# Patient Record
Sex: Male | Born: 1935 | Race: White | Hispanic: No | Smoking: Never smoker
Health system: Southern US, Community
[De-identification: ages and names within clinical notes are randomized; demographics above are authoritative.]

## PROBLEM LIST (undated history)

## (undated) DIAGNOSIS — K219 Gastro-esophageal reflux disease without esophagitis: Secondary | ICD-10-CM

## (undated) DIAGNOSIS — E785 Hyperlipidemia, unspecified: Secondary | ICD-10-CM

## (undated) DIAGNOSIS — I10 Essential (primary) hypertension: Secondary | ICD-10-CM

## (undated) DIAGNOSIS — G5603 Carpal tunnel syndrome, bilateral upper limbs: Secondary | ICD-10-CM

## (undated) DIAGNOSIS — M1990 Unspecified osteoarthritis, unspecified site: Secondary | ICD-10-CM

## (undated) HISTORY — PX: CHOLECYSTECTOMY: SHX55

## (undated) HISTORY — PX: REPLACEMENT TOTAL KNEE: SUR1224

---

## 1999-05-12 HISTORY — PX: INGUINAL HERNIA REPAIR: SUR1180

## 2008-05-17 ENCOUNTER — Ambulatory Visit: Payer: Self-pay | Admitting: Unknown Physician Specialty

## 2008-05-18 ENCOUNTER — Ambulatory Visit: Payer: Self-pay | Admitting: Unknown Physician Specialty

## 2009-05-27 ENCOUNTER — Ambulatory Visit: Payer: Self-pay | Admitting: Unknown Physician Specialty

## 2011-10-19 ENCOUNTER — Ambulatory Visit: Payer: Self-pay | Admitting: General Practice

## 2011-10-19 LAB — CBC
HCT: 40.5 % (ref 40.0–52.0)
HGB: 13.3 g/dL (ref 13.0–18.0)
MCH: 29.8 pg (ref 26.0–34.0)
MCHC: 32.9 g/dL (ref 32.0–36.0)
MCV: 91 fL (ref 80–100)
Platelet: 239 10*3/uL (ref 150–440)
RBC: 4.46 10*6/uL (ref 4.40–5.90)
RDW: 14.3 % (ref 11.5–14.5)
WBC: 5.4 10*3/uL (ref 3.8–10.6)

## 2011-10-19 LAB — BASIC METABOLIC PANEL
Anion Gap: 10 (ref 7–16)
BUN: 14 mg/dL (ref 7–18)
Calcium, Total: 8.9 mg/dL (ref 8.5–10.1)
Chloride: 109 mmol/L — ABNORMAL HIGH (ref 98–107)
Co2: 24 mmol/L (ref 21–32)
Creatinine: 1.09 mg/dL (ref 0.60–1.30)
EGFR (African American): >60
EGFR (Non-African Amer.): >60
Glucose: 89 mg/dL (ref 65–99)
Osmolality: 285 (ref 275–301)
Sodium: 143 mmol/L (ref 136–145)

## 2011-10-19 LAB — URINALYSIS, COMPLETE
Bilirubin,UR: NEGATIVE
Blood: NEGATIVE
Glucose,UR: NEGATIVE mg/dL (ref 0–75)
Leukocyte Esterase: NEGATIVE
Ph: 5 (ref 4.5–8.0)
Protein: NEGATIVE
RBC,UR: NONE SEEN /HPF (ref 0–5)
Specific Gravity: 1.016 (ref 1.003–1.030)
Squamous Epithelial: NONE SEEN
WBC UR: 1 /HPF (ref 0–5)

## 2011-10-19 LAB — PROTIME-INR
INR: 0.9
Prothrombin Time: 12.4 secs (ref 11.5–14.7)

## 2011-10-19 LAB — MRSA PCR SCREENING

## 2011-10-20 LAB — URINE CULTURE

## 2011-10-26 ENCOUNTER — Inpatient Hospital Stay: Payer: Self-pay | Admitting: General Practice

## 2011-10-27 LAB — BASIC METABOLIC PANEL
Anion Gap: 7 (ref 7–16)
BUN: 18 mg/dL (ref 7–18)
Calcium, Total: 8.4 mg/dL — ABNORMAL LOW (ref 8.5–10.1)
Co2: 26 mmol/L (ref 21–32)
Creatinine: 1.09 mg/dL (ref 0.60–1.30)
EGFR (African American): >60
EGFR (Non-African Amer.): >60
Glucose: 95 mg/dL (ref 65–99)
Potassium: 4.5 mmol/L (ref 3.5–5.1)
Sodium: 141 mmol/L (ref 136–145)

## 2011-10-28 LAB — PLATELET COUNT: Platelet: 161 10*3/uL (ref 150–440)

## 2011-10-28 LAB — BASIC METABOLIC PANEL
Anion Gap: 8 (ref 7–16)
BUN: 17 mg/dL (ref 7–18)
Calcium, Total: 7.9 mg/dL — ABNORMAL LOW (ref 8.5–10.1)
Creatinine: 1.12 mg/dL (ref 0.60–1.30)
EGFR (African American): >60
EGFR (Non-African Amer.): >60
Glucose: 81 mg/dL (ref 65–99)
Osmolality: 280 (ref 275–301)
Potassium: 4.1 mmol/L (ref 3.5–5.1)
Sodium: 140 mmol/L (ref 136–145)

## 2011-10-28 LAB — HEMOGLOBIN: HGB: 10.6 g/dL — ABNORMAL LOW (ref 13.0–18.0)

## 2014-09-02 NOTE — Op Note (Signed)
PATIENT NAME:  Thomas [name], GAZDA MR#:  [id number] DATE OF BIRTH:  [birth year]  DATE OF PROCEDURE:  10/26/2011  PREOPERATIVE DIAGNOSES:  1. Degenerative arthrosis of the right knee.  2. Retained hardware status post open reduction internal fixation of right patella fracture.   POSTOPERATIVE DIAGNOSES:  1. Degenerative arthrosis of the right knee.  2. Retained hardware status post open reduction internal fixation of right patella fracture.   PROCEDURES PERFORMED:  1. Right total knee arthroplasty using computer-assisted navigation.  2. Removal of retained wires from the right patella.   SURGEON: Laurice Record. Holley Bouche., MD   ASSISTANT: Vance Peper, PA-C (required to maintain retraction throughout the procedure)   ANESTHESIA: Femoral nerve block and spinal.   ESTIMATED BLOOD LOSS: 50 mL.   FLUIDS REPLACED: 1500 mL of Crystalloid.   DRAINS: Two medium drains to reinfusion system.   TOURNIQUET TIME: 120 minutes.   SOFT TISSUE RELEASES: Anterior cruciate ligament, posterior cruciate ligament, deep and superficial medial collateral ligament, patellofemoral ligament.   IMPLANTS UTILIZED: DePuy PFC Sigma size 4 posterior stabilized femoral component (cemented), size 5 MBT tibial component (cemented), 41 mm three peg oval dome patella (cemented), and a 10 mm stabilized rotating platform polyethylene insert.   INDICATIONS FOR SURGERY: The patient is a 81 year old male who has been seen for complaints of progressive right knee pain as well as varus deformity. X-rays demonstrated severe degenerative changes. Also of note were two screws and wires from previous open reduction internal fixation of right patella fracture. After discussion of the risks and benefits of surgical intervention, the patient expressed his understanding of the risks and benefits and agreed with plans for surgical intervention.   PROCEDURE IN DETAIL: The patient was brought in the operating room and, after adequate femoral nerve  block and spinal anesthesia was achieved, a tourniquet was placed on the patient's upper right thigh. The patient's right knee and leg were cleaned and prepped with alcohol and DuraPrep and draped in the usual sterile fashion. A "time-out" was performed as per usual protocol. The right lower extremity was exsanguinated using Esmarch and the tourniquet was inflated to 300 mmHg. Anterior longitudinal incision was made followed by a standard mid vastus approach. A large effusion was evacuated. The deep fibers of the medial collateral ligament were elevated in a subperiosteal fashion off the medial flare of the tibia so as to maintain a continuous soft tissue sleeve. The patella was subluxed laterally and the patellofemoral ligament was incised. Inspection of the knee demonstrated severe degenerative changes in tricompartmental fashion with eburnated bone noted most prominently to the medial compartment. Osteophytes were debrided using a rongeur. Anterior and posterior cruciate ligaments were excised. Two 4.0 mm Schanz pins were inserted into the femur and into the tibia for attachment of the ray of trackers used for computer-assisted navigation. Hip center was identified using circumduction technique. Distal landmarks were mapped using the computer. Distal femur and proximal tibia were mapped using the computer. Distal femoral cutting guide was positioned using computer-assisted navigation so as to achieve a 5 degree distal valgus cut. Cut was performed and verified using the computer. Distal femur was then sized and it was felt that a size 4 femoral component was appropriate. Size 4 cutting guide was positioned and the anterior cut was performed and verified using the computer. This was followed by completion of the posterior and chamfer cuts. Femoral cutting guide for the central box was then positioned and central box cut was performed.   Attention was then  directed to the proximal tibia. Medial and lateral  menisci were excised. The extramedullary tibial cutting guide was positioned using computer-assisted navigation so as to achieve 0 degree varus valgus alignment and 0 degree posterior slope. Cut was performed and verified using the computer. Proximal tibia was sized and it was felt that a size 5 tibial tray was appropriate. Size 5 tibial trial was placed as was femoral trial and a 10 mm polyethylene insert. The knee was felt to be tight medially. A Cobb elevator was used to elevate the superficial fibers of the medial collateral ligament. This allowed for excellent mediolateral soft tissue balancing both in full extension and in 90 degrees of flexion.   Finally, attention was directed to the patella. The two wires were identified and elevated off of the patella. An attempt was made to localize the screw heads of the cannulated screws. However, there was significant bony overgrowth at the sites and it was elected to remove the wires and leave the screws intact if possible. The wires were removed without difficulty. The patella was then cut and prepared so as to accommodate a 41 mm three peg oval dome patella. Again, there was no interference from the retained screws. Trial patellar component was placed and the knee was placed through a range of motion with excellent patellar tracking appreciated.   Femoral trial was removed after debridement of osteophytes off of the posterior condyles. The central post hole for the tibial component was reamed followed by insertion of a keel punch. Tibial trial was then removed. Cut surfaces of bone were irrigated with copious amounts of normal saline with antibiotic solution using pulsatile lavage and then suctioned dry. Polymethyl methacrylate cement was prepared in the usual fashion using a vacuum mixer. Cement was applied to the cut surface of the tibia as well as along the undersurface of a size 5 MBT tibial component. Tibial component was positioned and impacted into place.  Excess cement was removed using freer elevators. Cement was then applied to the cut surface of the femur as well as along the posterior flanges of size 4 posterior stabilized femoral component. Femoral component was positioned and impacted in place. Excess cement was removed using freer elevators. A 10 mm polyethylene trial was inserted and the knee was brought in full extension with steady axial compression applied. Finally, cement was applied to the backside of a 41 mm three peg oval dome patella and patellar component was positioned and patellar clamp applied. Excess cement was removed using freer elevators.   After adequate curing of cement, tourniquet was deflated after a total tourniquet time of 120 minutes. Hemostasis was achieved using electrocautery. Knee was irrigated with copious amounts of normal saline with antibiotic solution using pulsatile lavage and then suctioned dry. The knee was then inspected for any residual cement debris. 30 mL of 0.25% Marcaine with epinephrine was injected along the posterior capsule. A 10 mm stabilized rotating platform polyethylene insert was inserted and the knee was placed through a range of motion. Excellent mediolateral soft tissue balancing was appreciated and excellent patellar tracking was noted.   Two medium drains were placed in the wound bed and brought out through a separate stab incision to be attached to a reinfusion system. The medial parapatellar portion of the incision was reapproximated using interrupted sutures of #1 Vicryl. Subcutaneous tissue was approximated in layers using first #0 Vicryl followed by #2-0 Vicryl. Skin was closed with skin staples. Sterile dressing was applied.    The patient tolerated  the procedure well. He was transported to the recovery room in stable condition.   ____________________________ Laurice Record. Holley Bouche., MD jph:drc D: 10/26/2011 18:46:21 ET T: 10/27/2011 10:27:28 ET JOB#: [id number]  cc: Laurice Record. Holley Bouche., MD,  <Dictator> Laurice Record Holley Bouche MD ELECTRONICALLY SIGNED 10/30/2011 12:44

## 2014-09-02 NOTE — Discharge Summary (Signed)
PATIENT NAME:  Thomas [name], Thomas [name] MR#:  [id number] DATE OF BIRTH:  January 29, 1931  DATE OF ADMISSION:  10/26/2011 DATE OF DISCHARGE:  10/29/2011  ADMITTING DIAGNOSIS: Degenerative arthrosis of the right knee.   DISCHARGE DIAGNOSIS: Degenerative arthrosis of the right knee.   HISTORY: The patient is a pleasant 81 year old gentleman has been followed at [facility] for progression of right knee pain. He has previously underwent an ORIF of  a right patellar fracture performed by Dr. Leanor Kail several years ago. The patient had reported significant start-up stiffness as well as episodes of near giving way of the right knee. He was noted to have progressive bowing of his knees as well as some swelling. The patient localized most of the pain along the medial aspect of the joint line as well as some to the anterior portion. The patient had not seen any significant improvement in his condition despite the use of anti-inflammatory medications. His pain increased to the point that it was significantly interfering with his activities of daily living. X-rays taken in [facility] showed narrowing of the medial cartilage space with associated varus alignment. The patient was noted to have osteophyte as well as subchondral sclerosis. Also noted was screw and wires to the patella consistent with a history of ORIF of a fractured patella. After discussion of the risks and benefits of surgical intervention, the patient expressed his understanding of the risks and benefits and agreed for plans for surgical intervention.   PROCEDURE: Right total knee arthroplasty using computer-assisted navigation and removal of retained wire from the right patellar fracture. Anesthesia: Femoral nerve block with spinal. Soft tissue release: Anterior cruciate ligament, posterior cruciate ligament, deep and superficial medial collateral ligaments as well as the patellofemoral ligament. Implants utilized: DePuy PFC Sigma size 4  posterior stabilized femoral component (cemented), size 5 MBT tibial component (cemented), 41 mm three-pegged oval dome patella (cemented), and a 10 mm stabilized rotating platform polyethylene insert.   HOSPITAL COURSE: The patient tolerated the procedure very well. He had no complications. He was taken to the PAC-U where he was stabilized and then transferred to the Orthopedic floor. The patient began receiving anticoagulation therapy of Lovenox 30 mg subcutaneous every 12 hours per Anesthesia and Pharmacy protocol. He was fitted with the AVI compression foot pumps bilaterally set at 80 mmHg. TED stockings were applied. These were allowed to be removed one hour per 8-hour shift. The right one was applied on day 2 following removal of the Hemovac and the dressing change. The patient has denied any deep vein thrombosis of the lower extremity. His legs were elevated off the bed using rolled towels. The Polar Care was applied to the surgical leg maintaining a temperature of 40 to 50 degrees Fahrenheit.   The patient is denying any chest pain or shortness of breath. Vital signs have been stable. He has been afebrile. Hemodynamically he was stable. No transfusions were given other than the Autovac transfusions given the first six hours postoperatively.   The patient began receiving physical therapy on day one for gait training and transfers. He has done extremely well. Upon being discharged, he was ambulating greater than 200 feet. He was able to go up and down four sets of steps. He was independent with bed-to-chair transfers. Occupational therapy was also initiated on day one for activities of daily living and assistive devices.   The patient's IV, Foley and Hemovac were all decreased on day two along with a dressing change. The wound has been  free of any drainage or any signs of infection.   DISPOSITION: The patient is being discharged to home in improved stable condition.   DISCHARGE INSTRUCTIONS:   1. He may weight bear as tolerated. He will continue to use a walker until cleared by physical therapy to go to a quad cane.  2. He will receive Home Health physical therapy.  3. He will continue wearing his stockings during the day but may remove these at night.   4. He is to try to wear the Polar Care pretty much 24 hours a day if at all possible. He is to maintain a temperature of 40 to 50 degrees Fahrenheit with the Polar Care.  5. He is placed on a regular diet.  6. He has a follow-up appointment in the clinic in two weeks.  7. He is to call the clinic sooner if any temperatures of 101.5 or greater or excessive bleeding.   DISCHARGE MEDICATIONS: He is to resume his regular medications that he was on prior to admission. He was given a prescription for Lovenox 40 mg subcutaneously daily for 14 days, then discontinue and begin taking one 81 mg enteric-coated aspirin, a second prescription for Roxicodone 5 to 10 mg every 4 to 6 hours p.r.n. for pain, and a third prescription for Ultram 50 mg to 100 mg every 4 to 6 hours p.r.n. for pain.     PAST MEDICAL HISTORY:  1. Chickenpox. 2. Hypertension.  3. Hyperlipidemia.  4. Hernia. 5. Gastroesophageal reflux disease.  6. Colonic adenoma. 7. Cataracts bilateral eyes.   ____________________________ Vance Peper, PA [id number]:[id number] D: 10/29/2011 07:36:37 ET T: 10/29/2011 11:01:49 ET JOB#: [id number]  cc: Vance Peper, PA, <Dictator>  JON WOLFE PA ELECTRONICALLY SIGNED 10/29/2011 21:45

## 2017-12-30 ENCOUNTER — Other Ambulatory Visit: Payer: Self-pay | Admitting: Internal Medicine

## 2017-12-30 DIAGNOSIS — R221 Localized swelling, mass and lump, neck: Secondary | ICD-10-CM

## 2018-01-21 ENCOUNTER — Ambulatory Visit
Admission: RE | Admit: 2018-01-21 | Discharge: 2018-01-21 | Disposition: A | Discharge status: Home / Self Care | Payer: Medicare Other | Source: Ambulatory Visit | Attending: Internal Medicine | Admitting: Internal Medicine

## 2018-01-21 DIAGNOSIS — R221 Localized swelling, mass and lump, neck: Secondary | ICD-10-CM | POA: Diagnosis not present

## 2018-01-21 HISTORY — DX: Essential (primary) hypertension: I10

## 2018-01-21 LAB — POCT I-STAT CREATININE: Creatinine, Ser: 1.2 mg/dL (ref 0.61–1.24)

## 2018-01-21 MED ORDER — IOHEXOL 300 MG/ML  SOLN
75.0000 mL | Freq: Once | INTRAMUSCULAR | Status: AC | PRN
  Administered 2018-01-21: 75 mL via INTRAVENOUS

## 2018-01-27 ENCOUNTER — Encounter: Payer: Self-pay | Admitting: Internal Medicine

## 2018-01-27 ENCOUNTER — Inpatient Hospital Stay: Payer: Medicare Other | Attending: Internal Medicine | Admitting: Internal Medicine

## 2018-01-27 ENCOUNTER — Telehealth: Payer: Self-pay | Admitting: Internal Medicine

## 2018-01-27 DIAGNOSIS — Z800 Family history of malignant neoplasm of digestive organs: Secondary | ICD-10-CM | POA: Insufficient documentation

## 2018-01-27 DIAGNOSIS — Z79899 Other long term (current) drug therapy: Secondary | ICD-10-CM | POA: Insufficient documentation

## 2018-01-27 DIAGNOSIS — I10 Essential (primary) hypertension: Secondary | ICD-10-CM | POA: Insufficient documentation

## 2018-01-27 DIAGNOSIS — R221 Localized swelling, mass and lump, neck: Secondary | ICD-10-CM | POA: Diagnosis not present

## 2018-01-27 DIAGNOSIS — Z801 Family history of malignant neoplasm of trachea, bronchus and lung: Secondary | ICD-10-CM | POA: Diagnosis not present

## 2018-01-27 DIAGNOSIS — Z808 Family history of malignant neoplasm of other organs or systems: Secondary | ICD-10-CM | POA: Diagnosis not present

## 2018-01-27 DIAGNOSIS — Z7982 Long term (current) use of aspirin: Secondary | ICD-10-CM | POA: Insufficient documentation

## 2018-01-27 NOTE — Progress Notes (Signed)
[facility] CONSULT NOTE  Patient Care Team: Tracie Harrier, MD as PCP - General (Internal Medicine)  CHIEF COMPLAINTS/PURPOSE OF CONSULTATION: Right neck adenopathy  #September 2019-2 to 3 cm right neck lymph node  #   No history exists.     HISTORY OF PRESENTING ILLNESS:  Thomas [name] 84 y.o.  male with no significant past medical history fairly healthy for his age has been referred to [place] for further evaluation recommendations for right neck adenopathy.  Patient noted to have this lump in the right neck while shaving approximately 3 weeks ago.  He did not notice this getting any significantly bigger over the last few weeks.  Denies any unusual cough or shortness of breath or chest pain.  Denies any nausea vomiting.  Denies any difficulty swallowing.  Denies any throat pain.  Denies any history of melanoma.   Review of Systems  Constitutional: Negative for chills, diaphoresis, fever, malaise/fatigue and weight loss.  HENT: Negative for nosebleeds and sore throat.   Eyes: Negative for double vision.  Respiratory: Negative for cough, hemoptysis, sputum production, shortness of breath and wheezing.   Cardiovascular: Negative for chest pain, palpitations, orthopnea and leg swelling.  Gastrointestinal: Negative for abdominal pain, blood in stool, constipation, diarrhea, heartburn, melena, nausea and vomiting.  Genitourinary: Negative for dysuria, frequency and urgency.  Musculoskeletal: Positive for joint pain. Negative for back pain.  Skin: Negative.  Negative for itching and rash.  Neurological: Positive for tingling. Negative for dizziness, focal weakness, weakness and headaches.  Endo/Heme/Allergies: Does not bruise/bleed easily.  Psychiatric/Behavioral: Negative for depression. The patient is not nervous/anxious and does not have insomnia.      MEDICAL HISTORY:  Past Medical History:  Diagnosis Date  . Hypertension     SURGICAL HISTORY: Past Surgical  History:  Procedure Laterality Date  . INGUINAL HERNIA REPAIR  2001  . REPLACEMENT TOTAL KNEE Bilateral     SOCIAL HISTORY: No smoking no alcohol.  Patient works part-time at [employer]. Social History   Socioeconomic History  . Marital status: Widowed    Spouse name: Not on file  . Number of children: Not on file  . Years of education: Not on file  . Highest education level: Not on file  Occupational History  . Not on file  Social Needs  . Financial resource strain: Not on file  . Food insecurity:    Worry: Not on file    Inability: Not on file  . Transportation needs:    Medical: Not on file    Non-medical: Not on file  Tobacco Use  . Smoking status: Never Smoker  . Smokeless tobacco: Never Used  Substance and Sexual Activity  . Alcohol use: Never    Frequency: Never  . Drug use: Never  . Sexual activity: Not on file  Lifestyle  . Physical activity:    Days per week: Not on file    Minutes per session: Not on file  . Stress: Not on file  Relationships  . Social connections:    Talks on phone: Not on file    Gets together: Not on file    Attends religious service: Not on file    Active member of club or organization: Not on file    Attends meetings of clubs or organizations: Not on file    Relationship status: Not on file  . Intimate partner violence:    Fear of current or ex partner: Not on file    Emotionally abused:  Not on file    Physically abused: Not on file    Forced sexual activity: Not on file  Other Topics Concern  . Not on file  Social History Narrative  . Not on file    FAMILY HISTORY: Family History  Problem Relation Age of Onset  . Cancer Sister        Liver cancer  . Cancer Brother        Pancreatic  . Cancer Brother        Lung cancer  . Melanoma Sister   . Heart disease Brother     ALLERGIES:  has No Known Allergies.  MEDICATIONS:  Current Outpatient Medications  Medication Sig Dispense Refill  . aspirin EC 81 MG tablet  Take by mouth.    Marland Kitchen lisinopril (PRINIVIL,ZESTRIL) 10 MG tablet TAKE 1 TABLET BY MOUTH EVERY DAY    . lovastatin (MEVACOR) 40 MG tablet Take by mouth.    . ranitidine (ZANTAC) 150 MG tablet Take by mouth.     No current facility-administered medications for this visit.       Marland Kitchen  PHYSICAL EXAMINATION: ECOG PERFORMANCE STATUS: 0 - Asymptomatic  Vitals:   01/27/18 1353  BP: (!) 149/76  Pulse: 79  Resp: 16  Temp: 97.6 F (36.4 C)   Filed Weights   01/27/18 1353  Weight: 180 lb 6.4 oz (81.8 kg)    Physical Exam  Constitutional: He is oriented to person, place, and time and well-developed, well-nourished, and in no distress.  Walks by himself; with multiple family members.   HENT:  Head: Normocephalic and atraumatic.  Mouth/Throat: Oropharynx is clear and moist. No oropharyngeal exudate.  RIGHT neck ~3cm mass; non-tender.   Eyes: Pupils are equal, round, and reactive to light.  Neck: Normal range of motion. Neck supple.  Cardiovascular: Normal rate and regular rhythm.  Pulmonary/Chest: No respiratory distress. He has no wheezes.  Abdominal: Soft. Bowel sounds are normal. He exhibits no distension and no mass. There is no tenderness. There is no rebound and no guarding.  Musculoskeletal: Normal range of motion. He exhibits no edema or tenderness.  Neurological: He is alert and oriented to person, place, and time.  Skin: Skin is warm.  Psychiatric: Affect normal.     LABORATORY DATA:  I have reviewed the data as listed Lab Results  Component Value Date   WBC 5.4 10/19/2011   HGB 10.6 (L) 10/28/2011   HCT 40.5 10/19/2011   MCV 91 10/19/2011   PLT 161 10/28/2011   Recent Labs    01/21/18 1104  CREATININE 1.20    RADIOGRAPHIC STUDIES: I have personally reviewed the radiological images as listed and agreed with the findings in the report. Ct Soft Tissue Neck W Contrast  Result Date: 01/21/2018 CLINICAL DATA:  Right neck mass.  No history of cancer. EXAM: CT NECK  WITH CONTRAST TECHNIQUE: Multidetector CT imaging of the neck was performed using the standard protocol following the bolus administration of intravenous contrast. CONTRAST:  68mL OMNIPAQUE IOHEXOL 300 MG/ML  SOLN COMPARISON:  None. FINDINGS: Pharynx and larynx: Normal. No mass or swelling. Salivary glands: No inflammation, mass, or stone. Thyroid: Negative Lymph nodes: Right neck mass measures 3.1 x 2.8 x 3.5 cm. This is located in the right posterior neck behind the carotid and jugular. This is located below the hyoid bone at the level the superior pole of the thyroid. This shows heterogeneous enhancement with central irregular low-density areas suggesting necrosis. Otherwise no enlarged lymph nodes in the neck.  Vascular: Atherosclerotic disease in the carotid bifurcation bilaterally. Carotid and jugular patent bilaterally. Limited intracranial: Intracranial atherosclerotic calcification. No acute abnormality. Visualized orbits: Negative for orbital mass. Bilateral cataract surgery. Mastoids and visualized paranasal sinuses: Negative Skeleton: Advanced degenerative change in the cervical spine. No acute bony abnormality. Upper chest: Lung apices clear.  Atherosclerotic aortic arch Other: None IMPRESSION: Right neck mass 3.1 x 2.8 x 3.5 cm with heterogeneous enhancement suggesting central necrosis. Favor malignant lymph node and biopsy recommended . No pharyngeal mass identified. Direct mucosal inspection recommended. Alternately, this could represent a nerve sheath tumor. Electronically Signed   By: Franchot Gallo M.D.   On: 01/21/2018 14:02    ASSESSMENT & PLAN:   Mass of right side of neck # Right neck mass-fairly asymptomatic;  appears necrotic on CT scan approximately 3 cm in size; no primary is noted on the CT scan. Recommend CT chest ASAP for further.   # Recommend urgent evaluation with US guided biopsy.  Also recommend evaluation with ENT.  Discussed with Dr. Ninfa Meeker.   #Recent labs reviewed  within normal limits.   # I reviewed the blood work- with the patient in detail; also reviewed the imaging independently [as summarized above]; and with the patient in detail.    Thank you Dr.Hande for allowing me to participate in the care of your pleasant patient. Please do not hesitate to contact me with questions or concerns in the interim.  # 45 minutes face-to-face with the patient discussing the above plan of care; more than 50% of time spent on prognosis/ natural history; counseling and coordination.    All questions were answered. The patient knows to call the clinic with any problems, questions or concerns.    Cammie Sickle, MD 01/27/2018 5:15 PM

## 2018-01-27 NOTE — Assessment & Plan Note (Addendum)
#   Right neck mass-fairly asymptomatic;  appears necrotic on CT scan approximately 3 cm in size; no primary is noted on the CT scan. Recommend CT chest ASAP for further.   # Recommend urgent evaluation with US guided biopsy.  Also recommend evaluation with ENT.  Discussed with Dr. Ninfa Meeker.   #Recent labs reviewed within normal limits.   # I reviewed the blood work- with the patient in detail; also reviewed the imaging independently [as summarized above]; and with the patient in detail.    Thank you Dr.Hande for allowing me to participate in the care of your pleasant patient. Please do not hesitate to contact me with questions or concerns in the interim.  # 45 minutes face-to-face with the patient discussing the above plan of care; more than 50% of time spent on prognosis/ natural history; counseling and coordination.

## 2018-01-27 NOTE — Telephone Encounter (Signed)
Spoke to Dr.Enguel; who kindly agrees to evaluate the pt at the earliest for pt's right neck mass.

## 2018-02-01 ENCOUNTER — Other Ambulatory Visit: Payer: Self-pay | Admitting: Student

## 2018-02-01 ENCOUNTER — Ambulatory Visit
Admission: RE | Admit: 2018-02-01 | Discharge: 2018-02-01 | Disposition: A | Discharge status: Home / Self Care | Payer: Medicare Other | Source: Ambulatory Visit | Attending: Internal Medicine | Admitting: Internal Medicine

## 2018-02-01 DIAGNOSIS — I2510 Atherosclerotic heart disease of native coronary artery without angina pectoris: Secondary | ICD-10-CM | POA: Insufficient documentation

## 2018-02-01 DIAGNOSIS — R918 Other nonspecific abnormal finding of lung field: Secondary | ICD-10-CM | POA: Insufficient documentation

## 2018-02-01 DIAGNOSIS — I700 Atherosclerosis of aorta: Secondary | ICD-10-CM | POA: Insufficient documentation

## 2018-02-01 DIAGNOSIS — R221 Localized swelling, mass and lump, neck: Secondary | ICD-10-CM

## 2018-02-01 MED ORDER — IOHEXOL 300 MG/ML  SOLN
75.0000 mL | Freq: Once | INTRAMUSCULAR | Status: AC | PRN
  Administered 2018-02-01: 75 mL via INTRAVENOUS

## 2018-02-02 ENCOUNTER — Ambulatory Visit
Admission: RE | Admit: 2018-02-02 | Discharge: 2018-02-02 | Disposition: A | Discharge status: Home / Self Care | Payer: Medicare Other | Source: Ambulatory Visit | Attending: Internal Medicine | Admitting: Internal Medicine

## 2018-02-02 DIAGNOSIS — R221 Localized swelling, mass and lump, neck: Secondary | ICD-10-CM | POA: Diagnosis present

## 2018-02-02 DIAGNOSIS — R590 Localized enlarged lymph nodes: Secondary | ICD-10-CM | POA: Insufficient documentation

## 2018-02-02 HISTORY — DX: Gastro-esophageal reflux disease without esophagitis: K21.9

## 2018-02-02 HISTORY — DX: Unspecified osteoarthritis, unspecified site: M19.90

## 2018-02-02 LAB — CBC
HCT: 42.6 % (ref 40.0–52.0)
Hemoglobin: 14.6 g/dL (ref 13.0–18.0)
MCH: 31.2 pg (ref 26.0–34.0)
MCHC: 34.2 g/dL (ref 32.0–36.0)
MCV: 91.1 fL (ref 80.0–100.0)
PLATELETS: 274 10*3/uL (ref 150–440)
RBC: 4.68 MIL/uL (ref 4.40–5.90)
RDW: 14.6 % — AB (ref 11.5–14.5)
WBC: 6.5 10*3/uL (ref 3.8–10.6)

## 2018-02-02 LAB — PROTIME-INR
INR: 0.9
PROTHROMBIN TIME: 12.1 s (ref 11.4–15.2)

## 2018-02-02 LAB — APTT: APTT: 31 s (ref 24–36)

## 2018-02-02 MED ORDER — SODIUM CHLORIDE 0.9 % IV SOLN: INTRAVENOUS | Status: DC

## 2018-02-02 NOTE — Procedures (Signed)
Pre Procedure Dx: Cervical lymphadenopathy Post Procedural Dx: Same  Technically successful US guided biopsy of dominant right supraclavicular lymph node.  EBL: None  No immediate complications.   Ronny Bacon, MD Pager #: [phone]

## 2018-02-04 LAB — SURGICAL PATHOLOGY

## 2018-02-08 ENCOUNTER — Telehealth: Payer: Self-pay | Admitting: Internal Medicine

## 2018-02-08 NOTE — Telephone Encounter (Signed)
I tried to reach pt re: results of Biopsy/CT scan. Left voice mails x2.   Heather/brooke- please reach out to pt/family; and I could speak to them.   We will review the imaging and biopsy at next tumor conference [10/10].

## 2018-02-10 ENCOUNTER — Telehealth: Payer: Self-pay | Admitting: *Deleted

## 2018-02-10 ENCOUNTER — Telehealth: Payer: Self-pay | Admitting: Internal Medicine

## 2018-02-10 DIAGNOSIS — R918 Other nonspecific abnormal finding of lung field: Secondary | ICD-10-CM

## 2018-02-10 NOTE — Telephone Encounter (Signed)
Trying to return a call to Dr Rogue Bussing from Tuesday. Please return her call [phone]

## 2018-02-10 NOTE — Telephone Encounter (Signed)
Spoke to pt's daughter- biopsy being benign; CT findings.  Will need follow up CT in 6 months; CT is ordered/ please schedule CT/follow up with me few days later; no labs.

## 2018-02-10 NOTE — Telephone Encounter (Signed)
Colette, please schedule ct scans and md follow-up

## 2018-02-17 ENCOUNTER — Telehealth: Payer: Self-pay | Admitting: Internal Medicine

## 2018-02-17 ENCOUNTER — Inpatient Hospital Stay: Payer: Medicare Other | Attending: Internal Medicine

## 2018-02-17 NOTE — Telephone Encounter (Signed)
Reviewed the tumor conference; schwannoma/neck benign lesion-no further work-up follow-up needed;   Right lung opacity recommend follow-up CT scan in 6 months.  Appointments made for the patient.  Daughter is aware of the plan discussed previously.

## 2018-08-16 ENCOUNTER — Ambulatory Visit: Payer: Medicare Other

## 2018-08-17 ENCOUNTER — Other Ambulatory Visit: Payer: Self-pay

## 2018-08-18 ENCOUNTER — Inpatient Hospital Stay: Payer: Medicare Other | Attending: Internal Medicine | Admitting: Internal Medicine

## 2018-08-18 ENCOUNTER — Encounter: Payer: Self-pay | Admitting: Internal Medicine

## 2018-08-18 DIAGNOSIS — R918 Other nonspecific abnormal finding of lung field: Secondary | ICD-10-CM

## 2018-08-18 DIAGNOSIS — R221 Localized swelling, mass and lump, neck: Secondary | ICD-10-CM

## 2018-08-18 NOTE — Progress Notes (Signed)
I connected with Thomas [name] on 08/19/18 at 10:30 AM EDT by telephone visit and verified that I am speaking with the correct person using two identifiers.   I discussed the limitations, risks, security and privacy concerns of performing an evaluation and management service by telemedicine and the availability of in-person appointments. I also discussed with the patient that there may be a patient responsible charge related to this service. The patient expressed understanding and agreed to proceed.    Other persons participating in the visit and their role in the encounter: none    Patient's location: home  Provider's location: home   Chief Complaint: Neck mass & Lung nodules    History of present illness:Thomas [name] 85 y.o.  male with history of lung nodules and also right neck mass.  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  Patient denies any worsening shortness of breath or cough.  Denies any bone pain.  Observation/objective: Biopsy right neck mass September 2019 reviewed-schwannoma benign tumor.  Assessment and plan: Mass of right side of neck #Right neck mass-ultrasound-guided biopsy schwannoma.  Benign.  Clinically not getting worse.  No further imaging recommended.  #September 2019- bilateral subcentimeter lung nodules right lower more than left.  Right lower lobe- 7 mm sub-solid nodule.  Recommend repeat imaging in approximately 6 months; however given corona pandemic-I think it is reasonable to stretch out follow-up in 9 months.  We will plan to get CT scan prior to next visit.  #Disposition: Follow-up in 3 months/CT scan noncontrast prior.   Follow-up instructions:  I discussed the assessment and treatment plan with the patient.  The patient was provided an opportunity to ask questions and all were answered.  The patient agreed with the plan and demonstrated understanding of  instructions.  The patient was advised to call back or seek an in person evaluation if the symptoms worsen or if the condition fails to improve as anticipated.  I provided 12 minutes of non face-to-face telephone visit time during this encounter, and > 50% was spent counseling as documented under my assessment & plan.   Dr. Charlaine [place] at [facility] 08/19/2018 9:44 AM

## 2018-08-18 NOTE — Assessment & Plan Note (Signed)
#  Right neck mass-ultrasound-guided biopsy schwannoma.  Benign.  Clinically not getting worse.  No further imaging recommended.  #September 2019- bilateral subcentimeter lung nodules right lower more than left.  Right lower lobe- 7 mm sub-solid nodule.  Recommend repeat imaging in approximately 6 months; however given corona pandemic-I think it is reasonable to stretch out follow-up in 9 months.  We will plan to get CT scan prior to next visit.  #Disposition: Follow-up in 3 months/CT scan noncontrast prior.

## 2018-11-17 ENCOUNTER — Ambulatory Visit: Admission: RE | Admit: 2018-11-17 | Payer: Medicare Other | Source: Ambulatory Visit

## 2018-11-18 ENCOUNTER — Inpatient Hospital Stay: Payer: Medicare Other | Admitting: Internal Medicine

## 2018-11-25 ENCOUNTER — Telehealth: Payer: Self-pay | Admitting: *Deleted

## 2018-11-25 NOTE — Telephone Encounter (Signed)
Incoming call at 75 - daughter requested apts to be r/s for ct and md apt. These apts were r/s per family's request and provided to daughter. Daughter is aware of the hospital visitor restrictions and request for the patient's wearing masks while in the clinic. She would like to be contacted via cell phone next visit clinic to collaborate care. Daughter reassured that this request would be accommodated.

## 2018-12-01 ENCOUNTER — Ambulatory Visit
Admission: RE | Admit: 2018-12-01 | Discharge: 2018-12-01 | Disposition: A | Discharge status: Home / Self Care | Payer: Medicare Other | Source: Ambulatory Visit | Attending: Internal Medicine | Admitting: Internal Medicine

## 2018-12-01 ENCOUNTER — Other Ambulatory Visit: Payer: Self-pay

## 2018-12-01 DIAGNOSIS — R918 Other nonspecific abnormal finding of lung field: Secondary | ICD-10-CM | POA: Insufficient documentation

## 2018-12-05 ENCOUNTER — Other Ambulatory Visit: Payer: Self-pay

## 2018-12-05 ENCOUNTER — Inpatient Hospital Stay: Payer: Medicare Other | Attending: Internal Medicine | Admitting: Internal Medicine

## 2018-12-05 DIAGNOSIS — Z7982 Long term (current) use of aspirin: Secondary | ICD-10-CM | POA: Diagnosis not present

## 2018-12-05 DIAGNOSIS — D3610 Benign neoplasm of peripheral nerves and autonomic nervous system, unspecified: Secondary | ICD-10-CM | POA: Diagnosis not present

## 2018-12-05 DIAGNOSIS — Z801 Family history of malignant neoplasm of trachea, bronchus and lung: Secondary | ICD-10-CM | POA: Diagnosis not present

## 2018-12-05 DIAGNOSIS — I10 Essential (primary) hypertension: Secondary | ICD-10-CM | POA: Diagnosis not present

## 2018-12-05 DIAGNOSIS — K219 Gastro-esophageal reflux disease without esophagitis: Secondary | ICD-10-CM | POA: Diagnosis not present

## 2018-12-05 DIAGNOSIS — R221 Localized swelling, mass and lump, neck: Secondary | ICD-10-CM | POA: Diagnosis not present

## 2018-12-05 DIAGNOSIS — Z800 Family history of malignant neoplasm of digestive organs: Secondary | ICD-10-CM | POA: Insufficient documentation

## 2018-12-05 DIAGNOSIS — M1990 Unspecified osteoarthritis, unspecified site: Secondary | ICD-10-CM | POA: Insufficient documentation

## 2018-12-05 DIAGNOSIS — R918 Other nonspecific abnormal finding of lung field: Secondary | ICD-10-CM | POA: Insufficient documentation

## 2018-12-05 DIAGNOSIS — Z79899 Other long term (current) drug therapy: Secondary | ICD-10-CM | POA: Diagnosis not present

## 2018-12-05 DIAGNOSIS — Z808 Family history of malignant neoplasm of other organs or systems: Secondary | ICD-10-CM | POA: Diagnosis not present

## 2018-12-05 NOTE — Assessment & Plan Note (Addendum)
#  September 2019-[incidental bilateral subcentimeter lung nodules right lower more than left.  July 2020 CT scan shows-stable lung nodules; some improved otherwise very stable.  No progression.  Suspect benign lesions.  #Mild infiltrative/groundglass-noted in the left lower lobe lingula-suspect inflammatory versus infectious.  Patient is asymptomatic.  Would not recommend any treatment at this time.  If any symptoms recommend calling PCP for further work-up/antibiotics.  #Right neck mass-ultrasound-guided biopsy schwannoma/benign.  #Also spoke to patient's daughter over the phone/in agreement with the plan.  # Disposition:  # follow up as needed  # I reviewed the blood work- with the patient in detail; also reviewed the imaging independently [as summarized above]; and with the patient in detail.    Cc; Dr.Hande

## 2018-12-05 NOTE — Progress Notes (Signed)
Thomas [name] CONSULT NOTE  Patient Care Team: Tracie Harrier, MD as PCP - General (Internal Medicine)  CHIEF COMPLAINTS/PURPOSE OF CONSULTATION: Right neck adenopathy  #September 2019-2 to 3 cm right neck lymph node-biopsy schwannoma benign  #Bilateral subcentimeter lung nodule September 2019/incidental-July 2020 CT scan-stable to improved lung nodules; new groundglass opacities mild left lower lung-asymptomatic. Oncology History   No history exists.     HISTORY OF PRESENTING ILLNESS:  Thomas [name] 85 y.o.  male history of incidental subcentimeter lung nodules is here for a follow-up/to discuss results of the CT chest.  Patient denies any shortness of breath.  He denies any cough.  The lump in the right neck is not getting any bigger.   Review of Systems  Constitutional: Negative for chills, diaphoresis, fever, malaise/fatigue and weight loss.  HENT: Negative for nosebleeds and sore throat.   Eyes: Negative for double vision.  Respiratory: Negative for cough, hemoptysis, sputum production, shortness of breath and wheezing.   Cardiovascular: Negative for chest pain, palpitations, orthopnea and leg swelling.  Gastrointestinal: Negative for abdominal pain, blood in stool, constipation, diarrhea, heartburn, melena, nausea and vomiting.  Genitourinary: Negative for dysuria, frequency and urgency.  Musculoskeletal: Positive for joint pain. Negative for back pain.  Skin: Negative.  Negative for itching and rash.  Neurological: Positive for tingling. Negative for dizziness, focal weakness, weakness and headaches.  Endo/Heme/Allergies: Does not bruise/bleed easily.  Psychiatric/Behavioral: Negative for depression. The patient is not nervous/anxious and does not have insomnia.      MEDICAL HISTORY:  Past Medical History:  Diagnosis Date  . Arthritis   . GERD (gastroesophageal reflux disease)   . Hypertension     SURGICAL HISTORY: Past Surgical History:  Procedure  Laterality Date  . INGUINAL HERNIA REPAIR  2001  . REPLACEMENT TOTAL KNEE Bilateral     SOCIAL HISTORY: No smoking no alcohol.  Patient works part-time at [employer]. Social History   Socioeconomic History  . Marital status: Widowed    Spouse name: Not on file  . Number of children: Not on file  . Years of education: Not on file  . Highest education level: Not on file  Occupational History  . Not on file  Social Needs  . Financial resource strain: Not hard at all  . Food insecurity    Worry: Never true    Inability: Never true  . Transportation needs    Medical: No    Non-medical: Not on file  Tobacco Use  . Smoking status: Never Smoker  . Smokeless tobacco: Never Used  Substance and Sexual Activity  . Alcohol use: Never    Frequency: Never  . Drug use: Never  . Sexual activity: Not on file  Lifestyle  . Physical activity    Days per week: Not on file    Minutes per session: Not on file  . Stress: Not on file  Relationships  . Social Herbalist on phone: Not on file    Gets together: Not on file    Attends religious service: Not on file    Active member of club or organization: Not on file    Attends meetings of clubs or organizations: Not on file    Relationship status: Not on file  . Intimate partner violence    Fear of current or ex partner: Not on file    Emotionally abused: Not on file    Physically abused: Not on file    Forced sexual activity:  Not on file  Other Topics Concern  . Not on file  Social History Narrative  . Not on file    FAMILY HISTORY: Family History  Problem Relation Age of Onset  . Cancer Sister        Liver cancer  . Cancer Brother        Pancreatic  . Cancer Brother        Lung cancer  . Melanoma Sister   . Heart disease Brother     ALLERGIES:  has No Known Allergies.  MEDICATIONS:  Current Outpatient Medications  Medication Sig Dispense Refill  . aspirin EC 81 MG tablet Take by mouth.    Marland Kitchen lisinopril  (PRINIVIL,ZESTRIL) 10 MG tablet TAKE 1 TABLET BY MOUTH EVERY DAY    . lovastatin (MEVACOR) 40 MG tablet Take by mouth.    Marland Kitchen omeprazole (PRILOSEC) 20 MG capsule Take 1 capsule by mouth daily.     No current facility-administered medications for this visit.       Marland Kitchen  PHYSICAL EXAMINATION: ECOG PERFORMANCE STATUS: 0 - Asymptomatic  Vitals:   12/05/18 0954  BP: (!) 153/88  Pulse: 76  Resp: 20  Temp: (!) 97.3 F (36.3 C)   Filed Weights   12/05/18 0954  Weight: 178 lb (80.7 kg)    Physical Exam  Constitutional: He is oriented to person, place, and time and well-developed, well-nourished, and in no distress.  He is in a wheelchair.  He is alone.  HENT:  Head: Normocephalic and atraumatic.  Mouth/Throat: Oropharynx is clear and moist. No oropharyngeal exudate.  RIGHT neck ~3cm mass; non-tender.   Eyes: Pupils are equal, round, and reactive to light.  Neck: Normal range of motion. Neck supple.  Cardiovascular: Normal rate and regular rhythm.  Pulmonary/Chest: Effort normal and breath sounds normal. No respiratory distress. He has no wheezes.  Abdominal: Soft. Bowel sounds are normal. He exhibits no distension and no mass. There is no abdominal tenderness. There is no rebound and no guarding.  Musculoskeletal: Normal range of motion.        General: No tenderness or edema.  Neurological: He is alert and oriented to person, place, and time.  Skin: Skin is warm.  Psychiatric: Affect normal.     LABORATORY DATA:  I have reviewed the data as listed Lab Results  Component Value Date   WBC 6.5 02/02/2018   HGB 14.6 02/02/2018   HCT 42.6 02/02/2018   MCV 91.1 02/02/2018   PLT 274 02/02/2018   Recent Labs    01/21/18 1104  CREATININE 1.20    RADIOGRAPHIC STUDIES: I have personally reviewed the radiological images as listed and agreed with the findings in the report. Ct Chest Wo Contrast  Result Date: 12/02/2018 CLINICAL DATA:  Lung nodules. EXAM: CT CHEST WITHOUT  CONTRAST TECHNIQUE: Multidetector CT imaging of the chest was performed following the standard protocol without IV contrast. COMPARISON:  02/01/2018. FINDINGS: Cardiovascular: Atherosclerotic calcification of the aorta, aortic valve and coronary arteries. There may be a small ulcerative plaque off the transverse aorta. Heart size within normal limits. Left pericardial calcification. No pericardial effusion. Mediastinum/Nodes: No pathologically enlarged mediastinal or axillary lymph nodes. Hilar regions are difficult to evaluate without IV contrast but appear grossly unremarkable. Esophagus is grossly unremarkable. Lungs/Pleura: Calcified granulomas. A few scattered millimetric pulmonary nodules are unchanged. Previously measured area of mixed attenuation nodularity in the right lower lobe is no longer identified. Probable new areas of peribronchovascular ground-glass in the lingula and left lower lobe (series  3, image 113). Mild basilar bronchiectasis, subpleural ground-glass and coarsening. No pleural fluid. Airway is unremarkable. Upper Abdomen: Subcentimeter low-attenuation lesion in the dome of the liver, stable but too small to characterize. Visualized portions of the liver and gallbladder are unremarkable. Calcification is seen in the right adrenal gland. Left adrenal gland is unremarkable. Low-attenuation lesions in the kidneys measure up to 3.5 cm on the left and are incompletely imaged are too small to characterize. Visualized portions of the spleen, pancreas, stomach and bowel are unremarkable with exception of a moderate hiatal hernia. No upper abdominal adenopathy. Musculoskeletal: Degenerative changes in the spine. No worrisome lytic or sclerotic lesions. There may be mild compression of the T2 superior endplate, as before. IMPRESSION: 1. Interval clearing of previously seen right lower lobe mixed attenuation nodule. 2. Possible new areas of peribronchovascular ground-glass in the lingula and left lower  lobe. Infectious/inflammatory etiology or postinfectious scarring is favored. 3. Mild basilar subpleural ground-glass,, coarsening of bronchiectasis. Interstitial lung disease such as nonspecific interstitial pneumonitis could have this appearance. Findings are similar to the prior exam. 4. Aortic atherosclerosis (ICD10-170.0). Coronary artery calcification. Electronically Signed   By: Lorin Picket M.D.   On: 12/02/2018 09:21    ASSESSMENT & PLAN:   Multiple lung nodules #September 2019-[incidental bilateral subcentimeter lung nodules right lower more than left.  July 2020 CT scan shows-stable lung nodules; some improved otherwise very stable.  No progression.  Suspect benign lesions.  #Mild infiltrative/groundglass-noted in the left lower lobe lingula-suspect inflammatory versus infectious.  Patient is asymptomatic.  Would not recommend any treatment at this time.  If any symptoms recommend calling PCP for further work-up/antibiotics.  #Right neck mass-ultrasound-guided biopsy schwannoma/benign.  #Also spoke to patient's daughter over the phone/in agreement with the plan.  # Disposition:  # follow up as needed  Cc; Dr.Hande    All questions were answered. The patient knows to call the clinic with any problems, questions or concerns.    Cammie Sickle, MD 12/05/2018 2:12 PM

## 2020-05-27 ENCOUNTER — Other Ambulatory Visit: Payer: Self-pay

## 2020-05-27 ENCOUNTER — Emergency Department: Payer: Medicare (Managed Care)

## 2020-05-27 ENCOUNTER — Encounter: Payer: Self-pay | Admitting: *Deleted

## 2020-05-27 DIAGNOSIS — H5789 Other specified disorders of eye and adnexa: Secondary | ICD-10-CM | POA: Diagnosis not present

## 2020-05-27 DIAGNOSIS — Z7982 Long term (current) use of aspirin: Secondary | ICD-10-CM

## 2020-05-27 DIAGNOSIS — E785 Hyperlipidemia, unspecified: Secondary | ICD-10-CM | POA: Diagnosis present

## 2020-05-27 DIAGNOSIS — Z96653 Presence of artificial knee joint, bilateral: Secondary | ICD-10-CM | POA: Diagnosis present

## 2020-05-27 DIAGNOSIS — I4510 Unspecified right bundle-branch block: Secondary | ICD-10-CM | POA: Diagnosis present

## 2020-05-27 DIAGNOSIS — Z800 Family history of malignant neoplasm of digestive organs: Secondary | ICD-10-CM

## 2020-05-27 DIAGNOSIS — Z91138 Patient's unintentional underdosing of medication regimen for other reason: Secondary | ICD-10-CM

## 2020-05-27 DIAGNOSIS — G928 Other toxic encephalopathy: Secondary | ICD-10-CM | POA: Diagnosis not present

## 2020-05-27 DIAGNOSIS — I10 Essential (primary) hypertension: Secondary | ICD-10-CM | POA: Diagnosis present

## 2020-05-27 DIAGNOSIS — J9601 Acute respiratory failure with hypoxia: Secondary | ICD-10-CM | POA: Diagnosis not present

## 2020-05-27 DIAGNOSIS — T380X5A Adverse effect of glucocorticoids and synthetic analogues, initial encounter: Secondary | ICD-10-CM | POA: Diagnosis not present

## 2020-05-27 DIAGNOSIS — U071 COVID-19: Secondary | ICD-10-CM | POA: Diagnosis not present

## 2020-05-27 DIAGNOSIS — T464X6A Underdosing of angiotensin-converting-enzyme inhibitors, initial encounter: Secondary | ICD-10-CM | POA: Diagnosis present

## 2020-05-27 DIAGNOSIS — Z8249 Family history of ischemic heart disease and other diseases of the circulatory system: Secondary | ICD-10-CM

## 2020-05-27 DIAGNOSIS — G4700 Insomnia, unspecified: Secondary | ICD-10-CM | POA: Diagnosis present

## 2020-05-27 DIAGNOSIS — Z801 Family history of malignant neoplasm of trachea, bronchus and lung: Secondary | ICD-10-CM

## 2020-05-27 DIAGNOSIS — R7982 Elevated C-reactive protein (CRP): Secondary | ICD-10-CM | POA: Diagnosis present

## 2020-05-27 DIAGNOSIS — R531 Weakness: Secondary | ICD-10-CM | POA: Diagnosis present

## 2020-05-27 DIAGNOSIS — K219 Gastro-esophageal reflux disease without esophagitis: Secondary | ICD-10-CM | POA: Diagnosis present

## 2020-05-27 DIAGNOSIS — Z808 Family history of malignant neoplasm of other organs or systems: Secondary | ICD-10-CM

## 2020-05-27 DIAGNOSIS — Z79899 Other long term (current) drug therapy: Secondary | ICD-10-CM

## 2020-05-27 LAB — CBC
HCT: 43.4 % (ref 39.0–52.0)
Hemoglobin: 14.6 g/dL (ref 13.0–17.0)
MCH: 30.2 pg (ref 26.0–34.0)
MCHC: 33.6 g/dL (ref 30.0–36.0)
MCV: 89.7 fL (ref 80.0–100.0)
Platelets: 204 10*3/uL (ref 150–400)
RBC: 4.84 MIL/uL (ref 4.22–5.81)
RDW: 13.3 % (ref 11.5–15.5)
WBC: 4.9 10*3/uL (ref 4.0–10.5)
nRBC: 0 % (ref 0.0–0.2)

## 2020-05-27 LAB — TROPONIN I (HIGH SENSITIVITY)
Troponin I (High Sensitivity): 28 ng/L — ABNORMAL HIGH (ref <18)
Troponin I (High Sensitivity): 29 ng/L — ABNORMAL HIGH (ref <18)

## 2020-05-27 LAB — BASIC METABOLIC PANEL
Anion gap: 10 (ref 5–15)
BUN: 18 mg/dL (ref 8–23)
CO2: 22 mmol/L (ref 22–32)
Calcium: 8.6 mg/dL — ABNORMAL LOW (ref 8.9–10.3)
Chloride: 105 mmol/L (ref 98–111)
Creatinine, Ser: 1.08 mg/dL (ref 0.61–1.24)
GFR, Estimated: >60 mL/min (ref >60)
Glucose, Bld: 106 mg/dL — ABNORMAL HIGH (ref 70–99)
Potassium: 3.9 mmol/L (ref 3.5–5.1)
Sodium: 137 mmol/L (ref 135–145)

## 2020-05-27 MED ORDER — ACETAMINOPHEN 500 MG PO TABS
ORAL_TABLET | ORAL | Status: AC
  Administered 2020-05-27: 1000 mg via ORAL
  Filled 2020-05-27: qty 1

## 2020-05-27 MED ORDER — ACETAMINOPHEN 500 MG PO TABS: 1000.0000 mg | ORAL_TABLET | Freq: Once | ORAL | Status: AC

## 2020-05-27 NOTE — ED Notes (Signed)
Pt placed on 2 liters oxygen [place].  meds given for fever.  Pt alert

## 2020-05-27 NOTE — ED Triage Notes (Signed)
Pt to triage via wheelchair.  Pt reports a cough and congestion  for 2 weeks.  Denies sob.  No chest pain.   Pt alert   Speech clear.

## 2020-05-28 ENCOUNTER — Inpatient Hospital Stay
Admission: EM | Admit: 2020-05-28 | Discharge: 2020-05-31 | DRG: 177 | Disposition: A | Discharge status: Home Health | Payer: Medicare (Managed Care) | Attending: Internal Medicine | Admitting: Internal Medicine

## 2020-05-28 DIAGNOSIS — Z7982 Long term (current) use of aspirin: Secondary | ICD-10-CM | POA: Diagnosis not present

## 2020-05-28 DIAGNOSIS — E785 Hyperlipidemia, unspecified: Secondary | ICD-10-CM | POA: Diagnosis present

## 2020-05-28 DIAGNOSIS — Z96653 Presence of artificial knee joint, bilateral: Secondary | ICD-10-CM | POA: Diagnosis present

## 2020-05-28 DIAGNOSIS — I10 Essential (primary) hypertension: Secondary | ICD-10-CM | POA: Diagnosis present

## 2020-05-28 DIAGNOSIS — K219 Gastro-esophageal reflux disease without esophagitis: Secondary | ICD-10-CM

## 2020-05-28 DIAGNOSIS — Z79899 Other long term (current) drug therapy: Secondary | ICD-10-CM | POA: Diagnosis not present

## 2020-05-28 DIAGNOSIS — I4510 Unspecified right bundle-branch block: Secondary | ICD-10-CM | POA: Diagnosis present

## 2020-05-28 DIAGNOSIS — G4700 Insomnia, unspecified: Secondary | ICD-10-CM

## 2020-05-28 DIAGNOSIS — Z91138 Patient's unintentional underdosing of medication regimen for other reason: Secondary | ICD-10-CM | POA: Diagnosis not present

## 2020-05-28 DIAGNOSIS — J9601 Acute respiratory failure with hypoxia: Secondary | ICD-10-CM | POA: Diagnosis present

## 2020-05-28 DIAGNOSIS — Z8249 Family history of ischemic heart disease and other diseases of the circulatory system: Secondary | ICD-10-CM | POA: Diagnosis not present

## 2020-05-28 DIAGNOSIS — R531 Weakness: Secondary | ICD-10-CM | POA: Diagnosis present

## 2020-05-28 DIAGNOSIS — R059 Cough, unspecified: Secondary | ICD-10-CM

## 2020-05-28 DIAGNOSIS — G9341 Metabolic encephalopathy: Secondary | ICD-10-CM | POA: Diagnosis not present

## 2020-05-28 DIAGNOSIS — T380X5A Adverse effect of glucocorticoids and synthetic analogues, initial encounter: Secondary | ICD-10-CM | POA: Diagnosis not present

## 2020-05-28 DIAGNOSIS — Z808 Family history of malignant neoplasm of other organs or systems: Secondary | ICD-10-CM | POA: Diagnosis not present

## 2020-05-28 DIAGNOSIS — Z800 Family history of malignant neoplasm of digestive organs: Secondary | ICD-10-CM | POA: Diagnosis not present

## 2020-05-28 DIAGNOSIS — R7982 Elevated C-reactive protein (CRP): Secondary | ICD-10-CM | POA: Diagnosis present

## 2020-05-28 DIAGNOSIS — H5789 Other specified disorders of eye and adnexa: Secondary | ICD-10-CM | POA: Diagnosis not present

## 2020-05-28 DIAGNOSIS — U071 COVID-19: Secondary | ICD-10-CM

## 2020-05-28 DIAGNOSIS — Z801 Family history of malignant neoplasm of trachea, bronchus and lung: Secondary | ICD-10-CM | POA: Diagnosis not present

## 2020-05-28 DIAGNOSIS — G928 Other toxic encephalopathy: Secondary | ICD-10-CM | POA: Diagnosis not present

## 2020-05-28 DIAGNOSIS — T464X6A Underdosing of angiotensin-converting-enzyme inhibitors, initial encounter: Secondary | ICD-10-CM | POA: Diagnosis present

## 2020-05-28 LAB — HEPATIC FUNCTION PANEL
ALT: 28 U/L (ref 0–44)
AST: 41 U/L (ref 15–41)
Albumin: 3.5 g/dL (ref 3.5–5.0)
Alkaline Phosphatase: 59 U/L (ref 38–126)
Bilirubin, Direct: 0.3 mg/dL — ABNORMAL HIGH (ref 0.0–0.2)
Indirect Bilirubin: 0.7 mg/dL (ref 0.3–0.9)
Total Bilirubin: 1 mg/dL (ref 0.3–1.2)
Total Protein: 7.1 g/dL (ref 6.5–8.1)

## 2020-05-28 LAB — POC SARS CORONAVIRUS 2 AG -  ED: SARS Coronavirus 2 Ag: POSITIVE — AB

## 2020-05-28 MED ORDER — PANTOPRAZOLE SODIUM 40 MG PO TBEC
40.0000 mg | DELAYED_RELEASE_TABLET | Freq: Every day | ORAL | Status: DC
  Administered 2020-05-29 – 2020-05-31 (×3): 40 mg via ORAL
  Filled 2020-05-28 (×3): qty 1

## 2020-05-28 MED ORDER — DM-GUAIFENESIN ER 30-600 MG PO TB12
1.0000 | ORAL_TABLET | Freq: Two times a day (BID) | ORAL | Status: DC | PRN
  Administered 2020-05-29 – 2020-05-30 (×2): 1 via ORAL
  Filled 2020-05-28 (×3): qty 1

## 2020-05-28 MED ORDER — ZINC SULFATE 220 (50 ZN) MG PO CAPS
220.0000 mg | ORAL_CAPSULE | Freq: Every day | ORAL | Status: DC
  Administered 2020-05-28 – 2020-05-31 (×4): 220 mg via ORAL
  Filled 2020-05-28 (×4): qty 1

## 2020-05-28 MED ORDER — ONDANSETRON HCL 4 MG/2ML IJ SOLN: 4.0000 mg | Freq: Three times a day (TID) | INTRAMUSCULAR | Status: DC | PRN

## 2020-05-28 MED ORDER — ASCORBIC ACID 500 MG PO TABS
500.0000 mg | ORAL_TABLET | Freq: Every day | ORAL | Status: DC
  Administered 2020-05-28 – 2020-05-31 (×4): 500 mg via ORAL
  Filled 2020-05-28 (×4): qty 1

## 2020-05-28 MED ORDER — SODIUM CHLORIDE 0.9 % IV SOLN
200.0000 mg | Freq: Once | INTRAVENOUS | Status: AC
  Administered 2020-05-28: 200 mg via INTRAVENOUS
  Filled 2020-05-28: qty 200

## 2020-05-28 MED ORDER — PRAVASTATIN SODIUM 20 MG PO TABS
40.0000 mg | ORAL_TABLET | Freq: Every day | ORAL | Status: DC
  Administered 2020-05-29 – 2020-05-30 (×2): 40 mg via ORAL
  Filled 2020-05-28 (×2): qty 2

## 2020-05-28 MED ORDER — ASPIRIN EC 81 MG PO TBEC
81.0000 mg | DELAYED_RELEASE_TABLET | Freq: Every day | ORAL | Status: DC
  Administered 2020-05-29 – 2020-05-31 (×3): 81 mg via ORAL
  Filled 2020-05-28 (×3): qty 1

## 2020-05-28 MED ORDER — ENOXAPARIN SODIUM 40 MG/0.4ML ~~LOC~~ SOLN
40.0000 mg | SUBCUTANEOUS | Status: DC
  Administered 2020-05-28 – 2020-05-30 (×3): 40 mg via SUBCUTANEOUS
  Filled 2020-05-28 (×3): qty 0.4

## 2020-05-28 MED ORDER — ACETAMINOPHEN 325 MG PO TABS: 650.0000 mg | ORAL_TABLET | Freq: Four times a day (QID) | ORAL | Status: DC | PRN

## 2020-05-28 MED ORDER — ALBUTEROL SULFATE HFA 108 (90 BASE) MCG/ACT IN AERS
2.0000 | INHALATION_SPRAY | RESPIRATORY_TRACT | Status: DC | PRN
  Filled 2020-05-28 (×2): qty 6.7

## 2020-05-28 MED ORDER — IPRATROPIUM BROMIDE HFA 17 MCG/ACT IN AERS
2.0000 | INHALATION_SPRAY | Freq: Four times a day (QID) | RESPIRATORY_TRACT | Status: DC
  Administered 2020-05-29 – 2020-05-31 (×11): 2 via RESPIRATORY_TRACT
  Filled 2020-05-28 (×2): qty 12.9

## 2020-05-28 MED ORDER — METHYLPREDNISOLONE SODIUM SUCC 125 MG IJ SOLR
125.0000 mg | Freq: Once | INTRAMUSCULAR | Status: AC
  Administered 2020-05-28: 125 mg via INTRAVENOUS
  Filled 2020-05-28: qty 2

## 2020-05-28 MED ORDER — HYDRALAZINE HCL 20 MG/ML IJ SOLN: 5.0000 mg | INTRAMUSCULAR | Status: DC | PRN

## 2020-05-28 MED ORDER — METHYLPREDNISOLONE SODIUM SUCC 40 MG IJ SOLR
40.0000 mg | Freq: Two times a day (BID) | INTRAMUSCULAR | Status: DC
  Administered 2020-05-28 – 2020-05-29 (×2): 40 mg via INTRAVENOUS
  Filled 2020-05-28 (×2): qty 1

## 2020-05-28 MED ORDER — SODIUM CHLORIDE 0.9 % IV SOLN
100.0000 mg | Freq: Every day | INTRAVENOUS | Status: DC
  Administered 2020-05-29 – 2020-05-31 (×3): 100 mg via INTRAVENOUS
  Filled 2020-05-28 (×3): qty 100
  Filled 2020-05-28: qty 20

## 2020-05-28 MED ORDER — LACTATED RINGERS IV BOLUS
1000.0000 mL | Freq: Once | INTRAVENOUS | Status: AC
  Administered 2020-05-28: 1000 mL via INTRAVENOUS

## 2020-05-28 NOTE — ED Notes (Signed)
Pt being transported to floor at this time by EDT Mickel Baas

## 2020-05-28 NOTE — Evaluation (Signed)
Occupational Therapy Evaluation Patient Details Name: Thomas [name] MRN: [id number] DOB: 1930/11/01 Today's Date: 05/28/2020    History of Present Illness Thomas [name] is a 87 y.o. male with a history of hypertension and GERD who presents for evaluation of cough.  Patient reports that he has been sick for 2 weeks with a dry cough and congestion.  He reports that his symptoms have been stable but today his son-in-law came to visit and decided to bring him to the hospital to be evaluated.  He denies chest pain or shortness of breath, any known fevers, abdominal pain, vomiting or diarrhea.  He has been ambulatory at home with no difficulties.  He has not been vaccinated against flu or COVID. Pt found to be COVID + and adm with acute hypoxemic RF.   Clinical Impression   Pt seen for OT evaluation this date in setting of acute hospitalization for COVID-19. Pt reports being MOD I at baseline for fxl mobility with RW and reports he lives alone, but that his DTR and SIL live close and check on him regularly. Pt presents this date with some slight confusion, decreased fxl activity tolerance and some general weakness. Pt requires CGA/SUPV with ADL transfers with RW and MIN A with LB ADLs including donning his slip on shoes. Pt performs fxl mobility in the room with RW with SUPV/CGA for safety as well. Pt on RA throughout assessment. Noted to primarily maintain sats between 90-94% on RA at rest, but does de-sat to 88-89% on RA with activity. Requires ~20-30 seconds seated rest break to recover to >90% again. RN that presents to transport pt to C-pod notified of his O2 during session. Will continue to follow acutely. Anticipate pt could require STR at SNF upon d/c, but will continue to follow for progress as he indicates he would like to go home. Should he d/c home, highly anticipate need for SUPV, at least initially for safety.     Follow Up Recommendations  SNF    Equipment Recommendations  3 in 1 bedside  commode;Tub/shower seat    Recommendations for Other Services       Precautions / Restrictions Precautions Precautions: Fall Restrictions Weight Bearing Restrictions: No      Mobility Bed Mobility Overal bed mobility: Modified Independent                  Transfers Overall transfer level: Needs assistance Equipment used: Rolling walker (2 wheeled) Transfers: Sit to/from [facility] Sit to Stand: Min guard;Supervision Stand pivot transfers: Min guard;Supervision            Balance Overall balance assessment: Mild deficits observed, not formally tested                                         ADL either performed or assessed with clinical judgement   ADL Overall ADL's : Needs assistance/impaired                                       General ADL Comments: INDEP with seated UB ADLs, MIN A for seated LB ADLS. SUPV/CGA for ADL mobility.     Vision Patient Visual Report: No change from baseline       Perception     Praxis      Pertinent Vitals/Pain  Pain Assessment: No/denies pain     Hand Dominance     Extremity/Trunk Assessment Upper Extremity Assessment Upper Extremity Assessment: Generalized weakness (pt with slow concentrated movement of hands/digits. Reports he feels he has carpal tunnel. He is noted to have MMT of grip ~3+/5 bilaterally.)   Lower Extremity Assessment Lower Extremity Assessment: Generalized weakness       Communication     Cognition Arousal/Alertness: Awake/alert Behavior During Therapy: WFL for tasks assessed/performed Overall Cognitive Status: No family/caregiver present to determine baseline cognitive functioning                                 General Comments: Pt oriented to "hospital" and some aspects of situation, but not time or which hospital. Also was unaware of which department he was in. Pt able to follow simple 1-2 step commands, but some conflicting  PLOF information provided by pt and he is noted to be somewhat confused.   General Comments       Exercises Other Exercises Other Exercises: OT facilitates ed re: role of OT. Pt with moderate reception.   Shoulder Instructions      Home Living Family/patient expects to be discharged to:: Private residence Living Arrangements: Alone Available Help at Discharge: Family;Available PRN/intermittently (DTR and SIL live close) Type of Home: House                       Home Equipment: Walker - 2 wheels   Additional Comments: Pt reported being INDEP at baseline, but is questionable historian      Prior Functioning/Environment Level of Independence: Independent with assistive device(s)                 OT Problem List: Decreased strength;Decreased activity tolerance;Cardiopulmonary status limiting activity;Decreased knowledge of use of DME or AE      OT Treatment/Interventions: Self-care/ADL training;DME and/or AE instruction;Therapeutic activities;Balance training;Therapeutic exercise;Energy conservation;Patient/family education    OT Goals(Current goals can be found in the care plan section) Acute Rehab OT Goals Patient Stated Goal: to get better and go home OT Goal Formulation: With patient Time For Goal Achievement: 06/11/20 Potential to Achieve Goals: Fair ADL Goals Pt Will Perform Lower Body Bathing: with supervision;sit to/from stand (with AE PRN for EC) Pt Will Perform Lower Body Dressing: with supervision;sit to/from stand (with AE PRN for EC) Pt Will Transfer to Toilet: with supervision;ambulating;bedside commode (with 1 standing RB or less for energy conservation) Additional ADL Goal #1: Pt will verbalize/demo G understanding of 2 EC strategies with <10% verbal cues.  OT Frequency: Min 1X/week   Barriers to D/C:            Co-evaluation              AM-PAC OT "6 Clicks" Daily Activity     Outcome Measure Help from another person eating meals?:  None Help from another person taking care of personal grooming?: A Little Help from another person toileting, which includes using toliet, bedpan, or urinal?: A Little Help from another person bathing (including washing, rinsing, drying)?: A Little Help from another person to put on and taking off regular upper body clothing?: None Help from another person to put on and taking off regular lower body clothing?: A Little 6 Click Score: 20   End of Session Equipment Utilized During Treatment: Gait belt;Rolling walker Nurse Communication: Mobility status  Activity Tolerance: Patient tolerated treatment well Patient  left: in bed;with bed alarm set  OT Visit Diagnosis: Unsteadiness on feet (R26.81);Muscle weakness (generalized) (M62.81)                Time: 0379-4446 OT Time Calculation (min): 23 min Charges:  OT General Charges $OT Visit: 1 Visit OT Evaluation $OT Eval Moderate Complexity: 1 Mod OT Treatments $Self Care/Home Management : 8-22 mins  Rejeana Brock, MS, OTR/L ascom [phone] 05/28/20, 6:08 PM

## 2020-05-28 NOTE — ED Notes (Addendum)
This RN ambulated pt with pulse oximetry, pt's O2 saturation remained between 90-92% with ambulation on room air.Pt ambulated independently with a walker. MD Alfred Levins made aware.

## 2020-05-28 NOTE — ED Notes (Signed)
While talking, pt O2 saturation dropped to 88%. Pt placed on 2L nasal cannula, O2 increased to 93%.

## 2020-05-28 NOTE — Evaluation (Signed)
Physical Therapy Evaluation Patient Details Name: Thomas [name] MRN: [id number] DOB: 1930/12/18 Today's Date: 05/28/2020   History of Present Illness  Thomas [name] is a 87 y.o. male with a history of hypertension and GERD who presents for evaluation of cough.  Patient reports that he has been sick for 2 weeks with a dry cough and congestion.  He reports that his symptoms have been stable but today his son-in-law came to visit and decided to bring him to the hospital to be evaluated.  He denies chest pain or shortness of breath, any known fevers, abdominal pain, vomiting or diarrhea.  He has been ambulatory at home with no difficulties.  He has not been vaccinated against flu or COVID. Pt found to be COVID + and adm with acute hypoxemic RF.    Clinical Impression  Patient is a pleasantly confused 87 year old male who presents with generalized weakness secondary to COVID. Prior to hospital admission, pt was living at home alone and occasionally used a cane per his report. Patient's history is limited however by intermittent confusion. Patient is in bed upon PT arrival and agreeable to participate in therapy session. Vitals monitored with Sp02 remaining functional during bed mobility and transition EOB. Upon standing SP02 dropped to 88% on RA however upon ambulation returned to >90%. Patient is able to follow simple commands but is unreliable historian. Returned to bed with needs met.  Pt would benefit from skilled PT to address noted impairments and functional limitations (see below for any additional details).  Upon hospital discharge, pt would benefit from home health PT and an aide. .      Follow Up Recommendations Home health PT;Supervision/Assistance - 24 hour    Equipment Recommendations  Rolling walker with 5" wheels;3in1 (PT)    Recommendations for Other Services       Precautions / Restrictions Precautions Precautions: Fall Restrictions Weight Bearing Restrictions: No       Mobility  Bed Mobility Overal bed mobility: Modified Independent                  Transfers Overall transfer level: Needs assistance Equipment used: Rolling walker (2 wheeled) Transfers: Sit to/from [employer] Transfers Sit to Stand: Min guard;Supervision Stand pivot transfers: Min guard;Supervision       General transfer comment: cues for safety and hand placement required  Ambulation/Gait Ambulation/Gait assistance: Min guard Gait Distance (Feet): 20 Feet Assistive device: Rolling walker (2 wheeled) Gait Pattern/deviations: Step-through pattern;Decreased stance time - left;Decreased weight shift to left;Shuffle;Trunk flexed;Narrow base of support Gait velocity: decreased   General Gait Details: patient has limited foot clearance of LLE. able to negotiate room safely with SpO2 returning to >90  Stairs            Wheelchair Mobility    Modified Rankin (Stroke Patients Only)       Balance Overall balance assessment: Needs assistance Sitting-balance support: Feet unsupported;Single extremity supported Sitting balance-[name]: Fair Sitting balance - Comments: able to static sit and reach without LOB. confusion limits full extent of balance interventions   Standing balance support: Bilateral upper extremity supported;During functional activity Standing balance-[name]: Poor Standing balance comment: Patient requires use of RW for ambulation. Is able to static stand briefly without UE support.                             Pertinent Vitals/Pain Pain Assessment: No/denies pain    Home Living Family/patient  expects to be discharged to:: Private residence Living Arrangements: Alone Available Help at Discharge: Family;Available PRN/intermittently (DTR and SIL live close) Type of Home: House Home Access: Stairs to enter   Entrance Stairs-Number of Steps: 2-3 Home Layout: One level Home Equipment: Walker - 2 wheels;Cane - single  point;Shower seat Additional Comments: Pt reported being INDEP at baseline, but is questionable historian    Prior Function Level of Independence: Independent with assistive device(s)         Comments: Per patient he was independent at home, occasionally used a cane. reports his son helps with his yard work however patient is confused throughout session making history unreliable.     Hand Dominance        Extremity/Trunk Assessment   Upper Extremity Assessment Upper Extremity Assessment: Defer to OT evaluation    Lower Extremity Assessment Lower Extremity Assessment: RLE deficits/detail;LLE deficits/detail RLE Deficits / Details: hips grossly 4-/5 with knees 3/5 RLE Sensation: decreased light touch LLE Deficits / Details: hips grossly 3+/5 with knees 3/5 LLE Sensation: decreased light touch LLE Coordination: decreased gross motor       Communication   Communication: HOH  Cognition Arousal/Alertness: Awake/alert Behavior During Therapy: WFL for tasks assessed/performed;Impulsive Overall Cognitive Status: No family/caregiver present to determine baseline cognitive functioning                                 General Comments: Patient intermittently confused throughout session. Able to follow simple commands but limitd with complex. Conflicting history given with patient correcting self and then stating later a different fact.      General Comments General comments (skin integrity, edema, etc.): Patient is slightly disheveled.    Exercises Other Exercises Other Exercises: Education on role of PT in acute care setting, safe transfers and body mechanics, safe use or RW with ambulation, purse lipped breathing for Sp02   Assessment/Plan    PT Assessment Patient needs continued PT services  PT Problem List Decreased strength;Decreased activity tolerance;Decreased balance;Decreased mobility;Decreased coordination;Cardiopulmonary status limiting  activity;Decreased safety awareness       PT Treatment Interventions DME instruction;Gait training;Stair training;Functional mobility training;Therapeutic activities;Therapeutic exercise;Manual techniques;Patient/family education;Cognitive remediation;Neuromuscular re-education;Balance training    PT Goals (Current goals can be found in the Care Plan section)  Acute Rehab PT Goals Patient Stated Goal: to get better and go home PT Goal Formulation: With patient Time For Goal Achievement: 06/11/20 Potential to Achieve Goals: Fair    Frequency Min 2X/week   Barriers to discharge   will require HHPT and an aide    Co-evaluation               AM-PAC PT "6 Clicks" Mobility  Outcome Measure Help needed turning from your back to your side while in a flat bed without using bedrails?: A Little Help needed moving from lying on your back to sitting on the side of a flat bed without using bedrails?: A Little Help needed moving to and from a bed to a chair (including a wheelchair)?: A Little Help needed standing up from a chair using your arms (e.g., wheelchair or bedside chair)?: A Little Help needed to walk in hospital room?: A Little Help needed climbing 3-5 steps with a railing? : A Lot 6 Click Score: 17    End of Session Equipment Utilized During Treatment: Gait belt Activity Tolerance: Patient tolerated treatment well;Other (comment);Patient limited by fatigue (confusion) Patient left: in bed;with call bell/phone  within reach Nurse Communication: Mobility status PT Visit Diagnosis: Unsteadiness on feet (R26.81);Other abnormalities of gait and mobility (R26.89);Muscle weakness (generalized) (M62.81)    Time: 9276-3943 PT Time Calculation (min) (ACUTE ONLY): 17 min   Charges:   PT Evaluation $PT Eval Low Complexity: 1 Low PT Treatments $Therapeutic Activity: 8-22 mins       Janna Arch, PT, DPT   05/28/2020, 6:22 PM

## 2020-05-28 NOTE — ED Notes (Signed)
This RN spoke with pt's son-in-law, states that he can be reached at his cell ([phone]) or his office [phone]) with any updates.

## 2020-05-28 NOTE — ED Provider Notes (Signed)
[facility] Emergency Department Provider Note  ____________________________________________  Time seen: Approximately 4:23 AM  I have reviewed the triage vital signs and the nursing notes.   HISTORY  Chief Complaint Cough   HPI Thomas [name] is a 87 y.o. male with a history of hypertension and GERD who presents for evaluation of cough.  Patient reports that he has been sick for 2 weeks with a dry cough and congestion.  He reports that his symptoms have been stable but today his son-in-law came to visit and decided to bring him to the hospital to be evaluated.  He denies chest pain or shortness of breath, any known fevers, abdominal pain, vomiting or diarrhea.  He has been ambulatory at home with no difficulties.  He has not been vaccinated against flu or COVID.  Denies any known exposures.  Denies any history of lung disease, PE or DVT, recent travel immobilization, leg pain or swelling, hemoptysis or exogenous hormones.   Past Medical History:  Diagnosis Date  . Arthritis   . GERD (gastroesophageal reflux disease)   . Hypertension     Patient Active Problem List   Diagnosis Date Noted  . Multiple lung nodules 12/05/2018  . Mass of right side of neck 01/27/2018    Past Surgical History:  Procedure Laterality Date  . INGUINAL HERNIA REPAIR  2001  . REPLACEMENT TOTAL KNEE Bilateral     Prior to Admission medications   Medication Sig Start Date End Date Taking? Authorizing Provider  aspirin EC 81 MG tablet Take by mouth.    [provider]  lisinopril (PRINIVIL,ZESTRIL) 10 MG tablet TAKE 1 TABLET BY MOUTH EVERY DAY 03/14/12   [provider]  lovastatin (MEVACOR) 40 MG tablet Take by mouth. 03/14/12   [provider]  omeprazole (PRILOSEC) 20 MG capsule Take 1 capsule by mouth daily. 10/24/18   [provider]    Allergies Patient has no known allergies.  Family History  Problem Relation Age of Onset  . Cancer  Sister        Liver cancer  . Cancer Brother        Pancreatic  . Cancer Brother        Lung cancer  . Melanoma Sister   . Heart disease Brother     Social History Social History   Tobacco Use  . Smoking status: Never Smoker  . Smokeless tobacco: Never Used  Vaping Use  . Vaping Use: Never used  Substance Use Topics  . Alcohol use: Never  . Drug use: Never    Review of Systems  Constitutional: Negative for fever. Eyes: Negative for visual changes. ENT: Negative for sore throat. + congestion Neck: No neck pain  Cardiovascular: Negative for chest pain. Respiratory: Negative for shortness of breath. + cough Gastrointestinal: Negative for abdominal pain, vomiting or diarrhea. Genitourinary: Negative for dysuria. Musculoskeletal: Negative for back pain. Skin: Negative for rash. Neurological: Negative for headaches, weakness or numbness. Psych: No SI or HI  ____________________________________________   PHYSICAL EXAM:  VITAL SIGNS: Vitals:   05/28/20 0515 05/28/20 0530  BP:    Pulse: 83   Resp:    Temp:    SpO2: 90% 92%   Constitutional: Alert and oriented. Well appearing and in no apparent distress. HEENT:      Head: Normocephalic and atraumatic.         Eyes: Conjunctivae are normal. Sclera is non-icteric.       Mouth/Throat: Mucous membranes are moist.  Neck: Supple with no signs of meningismus. Cardiovascular: Regular rate and rhythm. No murmurs, gallops, or rubs. 2+ symmetrical distal pulses are present in all extremities. No JVD. Respiratory: Normal respiratory effort. Lungs are clear to auscultation bilaterally. No wheezes, crackles, or rhonchi.  Gastrointestinal: Soft, non tender, and non distended. Musculoskeletal:  No edema, cyanosis, or erythema of extremities. Neurologic: Normal speech and language. Face is symmetric. Moving all extremities. No gross focal neurologic deficits are appreciated. Skin: Skin is warm, dry and intact. No rash  noted. Psychiatric: Mood and affect are normal. Speech and behavior are normal.  ____________________________________________   LABS (all labs ordered are listed, but only abnormal results are displayed)  Labs Reviewed  BASIC METABOLIC PANEL - Abnormal; Notable for the following components:      Result Value   Glucose, Bld 106 (*)    Calcium 8.6 (*)    All other components within normal limits  POC SARS CORONAVIRUS 2 AG -  ED - Abnormal; Notable for the following components:   SARS Coronavirus 2 Ag Positive (*)    All other components within normal limits  TROPONIN I (HIGH SENSITIVITY) - Abnormal; Notable for the following components:   Troponin I (High Sensitivity) 28 (*)    All other components within normal limits  TROPONIN I (HIGH SENSITIVITY) - Abnormal; Notable for the following components:   Troponin I (High Sensitivity) 29 (*)    All other components within normal limits  CBC   ____________________________________________  EKG  ED ECG REPORT I, Rudene Re, the attending physician, personally viewed and interpreted this ECG.  Sinus tachycardia, rate of 102, RBBB, right axis deviation, no ST elevations or depressions.  No significant changes when compared to prior from 2013 ____________________________________________  RADIOLOGY  I have personally reviewed the images performed during this visit and I agree with the Radiologist's read.   Interpretation by Radiologist:  DG Chest 2 View  Result Date: 05/27/2020 CLINICAL DATA:  Cough and congestion EXAM: CHEST - 2 VIEW COMPARISON:  12/01/2018 FINDINGS: Cardiac shadow is within normal limits. Mild aortic calcifications are noted. The lungs are well aerated bilaterally. Mild patchy bibasilar scarring is noted similar to that seen on prior CT examination. No focal confluent infiltrate is seen. No bony abnormality is noted. IMPRESSION: No acute abnormality noted. Mild scarring is noted in the bases bilaterally.  Electronically Signed   By: Inez Catalina M.D.   On: 05/27/2020 19:35     ____________________________________________   PROCEDURES  Procedure(s) performed:yes .1-3 Lead EKG Interpretation Performed by: Rudene Re, MD Authorized by: Rudene Re, MD     Interpretation: non-specific     ECG rate assessment: normal     Rhythm: sinus rhythm     Ectopy: none     Critical Care performed: yes  CRITICAL CARE Performed by: Rudene Re  ?  Total critical care time: 35 min  Critical care time was exclusive of separately billable procedures and treating other patients.  Critical care was necessary to treat or prevent imminent or life-threatening deterioration.  Critical care was time spent personally by me on the following activities: development of treatment plan with patient and/or surrogate as well as nursing, discussions with consultants, evaluation of patient's response to treatment, examination of patient, obtaining history from patient or surrogate, ordering and performing treatments and interventions, ordering and review of laboratory studies, ordering and review of radiographic studies, pulse oximetry and re-evaluation of patient's condition.  ____________________________________________   INITIAL IMPRESSION / ASSESSMENT AND PLAN / ED  COURSE  87 y.o. male with a history of hypertension and GERD who presents for evaluation of cough and congestion x2 weeks.  Patient is well-appearing in no distress during my evaluation has normal work of breathing, satting 95 to 100% on room air, lungs are clear to auscultation.  It is documented the patient was hypoxic in triage with sats of 89%.  He is not currently on oxygen.  We will ambulate to see if patient becomes hypoxic.  Chest x-ray visualized by me with no acute findings, confirmed by radiology.  He is COVID-positive.  Labs visualized with stable mildly elevated troponin, normal metabolic panel with no electrolyte  derangements, no anemia or leukocytosis.  EKG is unchanged compared to baseline.  Patient has no chest pain or shortness of breath therefore PE less likely.  _________________________ 5:48 AM on 05/28/2020 -----------------------------------------  Patient hypoxic to 87% with minimal movement in bed and while talking. Placed on 2 L [place] with improvement of stas to low 90s. Will give steroids and remdesivor and consult the hospitalist for admission    _____________________________________________ Please note:  Patient was evaluated in Emergency Department today for the symptoms described in the history of present illness. Patient was evaluated in the context of the global COVID-19 pandemic, which necessitated consideration that the patient might be at risk for infection with the SARS-CoV-2 virus that causes COVID-19. Institutional protocols and algorithms that pertain to the evaluation of patients at risk for COVID-19 are in a state of rapid change based on information released by regulatory bodies including the CDC and federal and state organizations. These policies and algorithms were followed during the patient's care in the ED.  Some ED evaluations and interventions may be delayed as a result of limited staffing during the pandemic.   [place] Controlled Substance Database was reviewed by me. ____________________________________________   FINAL CLINICAL IMPRESSION(S) / ED DIAGNOSES   Final diagnoses:  Acute respiratory failure with hypoxia ([place])  COVID-19      NEW MEDICATIONS STARTED DURING THIS VISIT:  ED Discharge Orders    None       Note:  This document was prepared using Dragon voice recognition software and may include unintentional dictation errors.    Rudene Re, MD 05/28/20 [phone]

## 2020-05-28 NOTE — H&P (Signed)
History and Physical    Thomas [name] [id number]:[id number] DOB: 04/29/1931 DOA: 05/28/2020  Referring MD/NP/PA:   PCP: Tracie Harrier, MD   Patient coming from:  The patient is coming from home.  At baseline, pt is independent for most of ADL.        Chief Complaint: Fever, cough, shortness of breath  HPI: Thomas [name] is a 87 y.o. male with medical history significant of hypertension, hyperlipidemia, GERD, arthritis, who presents with fever, cough, shortness breath.  Patient states that he has been having fever, chills, cough, shortness of breath, chest congestion, nausea, poor appetite, decreased oral intake for 2 weeks, which has worsened in the past several days.  Patient does not have chest pain.  No vomiting, diarrhea or abdominal pain.  No symptoms of UTI. Patient was found to have oxygen desaturation to 86% on room air, which improved to 91-97% on 2 L oxygen.  ED Course: pt was found to have positive COVID Ag test, WBC 4.9, troponin level 28, 29, electrolytes renal function okay, temperature one 1.9, blood pressure 119/80, heart rate of 133, 85, RR 22, chest x-ray negative for infiltration.  Patient is admitted to [place] bed as inpatient.   Review of Systems:   General: has fevers, chills, no body weight gain, has poor appetite, has fatigue HEENT: no blurry vision, hearing changes or sore throat Respiratory: has dyspnea, coughing, no wheezing CV: no chest pain, no palpitations GI: no nausea, vomiting, abdominal pain, diarrhea, constipation GU: no dysuria, burning on urination, increased urinary frequency, hematuria  Ext: no leg edema Neuro: no unilateral weakness, numbness, or tingling, no vision change or hearing loss Skin: no rash, no skin tear. MSK: No muscle spasm, no deformity, no limitation of range of movement in spin Heme: No easy bruising.  Travel history: No recent long distant travel.  Allergy: No Known Allergies  Past Medical History:  Diagnosis Date  .  Arthritis   . GERD (gastroesophageal reflux disease)   . Hypertension     Past Surgical History:  Procedure Laterality Date  . INGUINAL HERNIA REPAIR  2001  . REPLACEMENT TOTAL KNEE Bilateral     Social History:  reports that he has never smoked. He has never used smokeless tobacco. He reports that he does not drink alcohol and does not use drugs.  Family History:  Family History  Problem Relation Age of Onset  . Cancer Sister        Liver cancer  . Cancer Brother        Pancreatic  . Cancer Brother        Lung cancer  . Melanoma Sister   . Heart disease Brother      Prior to Admission medications   Medication Sig Start Date End Date Taking? Authorizing Provider  aspirin EC 81 MG tablet Take by mouth.    [provider]  lisinopril (PRINIVIL,ZESTRIL) 10 MG tablet TAKE 1 TABLET BY MOUTH EVERY DAY 03/14/12   [provider]  lovastatin (MEVACOR) 40 MG tablet Take by mouth. 03/14/12   [provider]  omeprazole (PRILOSEC) 20 MG capsule Take 1 capsule by mouth daily. 10/24/18   [provider]    Physical Exam: Vitals:   05/28/20 0515 05/28/20 0530 05/28/20 0615 05/28/20 0630  BP:      Pulse: 83  82 85  Resp:      Temp:      TempSrc:      SpO2: 90% 92% 92% 91%  Weight:  Height:       General: Not in acute distress HEENT:       Eyes: PERRL, EOMI, no scleral icterus.       ENT: No discharge from the ears and nose, no pharynx injection, no tonsillar enlargement.        Neck: No JVD, no bruit, no mass felt. Heme: No neck lymph node enlargement. Cardiac: S1/S2, RRR, No murmurs, No gallops or rubs. Respiratory: has coarse breathing sounds bilaterally GI: Soft, nondistended, nontender, no rebound pain, no organomegaly, BS present. GU: No hematuria Ext: No pitting leg edema bilaterally. 2+DP/PT pulse bilaterally. Musculoskeletal: No joint deformities, No joint redness or warmth, no limitation of ROM in spin. Skin: No rashes.  Neuro:  Alert, oriented X3, cranial nerves II-XII grossly intact, moves all extremities normally. Psych: Patient is not psychotic, no suicidal or hemocidal ideation.  Labs on Admission: I have personally reviewed following labs and imaging studies  CBC: Recent Labs  Lab 05/27/20 1914  WBC 4.9  HGB 14.6  HCT 43.4  MCV 89.7  PLT 621   Basic Metabolic Panel: Recent Labs  Lab 05/27/20 1914  NA 137  K 3.9  CL 105  CO2 22  GLUCOSE 106*  BUN 18  CREATININE 1.08  CALCIUM 8.6*   GFR: Estimated Creatinine Clearance: 41.5 mL/min (by C-G formula based on SCr of 1.08 mg/dL). Liver Function Tests: No results for input(s): AST, ALT, ALKPHOS, BILITOT, PROT, ALBUMIN in the last 168 hours. No results for input(s): LIPASE, AMYLASE in the last 168 hours. No results for input(s): AMMONIA in the last 168 hours. Coagulation Profile: No results for input(s): INR, PROTIME in the last 168 hours. Cardiac Enzymes: No results for input(s): CKTOTAL, CKMB, CKMBINDEX, TROPONINI in the last 168 hours. BNP (last 3 results) No results for input(s): PROBNP in the last 8760 hours. HbA1C: No results for input(s): HGBA1C in the last 72 hours. CBG: No results for input(s): GLUCAP in the last 168 hours. Lipid Profile: No results for input(s): CHOL, HDL, LDLCALC, TRIG, CHOLHDL, LDLDIRECT in the last 72 hours. Thyroid Function Tests: No results for input(s): TSH, T4TOTAL, FREET4, T3FREE, THYROIDAB in the last 72 hours. Anemia Panel: No results for input(s): VITAMINB12, FOLATE, FERRITIN, TIBC, IRON, RETICCTPCT in the last 72 hours. Urine analysis:    Component Value Date/Time   COLORURINE Yellow 10/19/2011 0819   APPEARANCEUR Clear 10/19/2011 0819   LABSPEC 1.016 10/19/2011 0819   PHURINE 5.0 10/19/2011 0819   GLUCOSEU Negative 10/19/2011 0819   HGBUR Negative 10/19/2011 0819   BILIRUBINUR Negative 10/19/2011 0819   KETONESUR Negative 10/19/2011 0819   PROTEINUR Negative 10/19/2011 0819   NITRITE Negative  10/19/2011 0819   LEUKOCYTESUR Negative 10/19/2011 0819   Sepsis Labs: @LABRCNTIP (procalcitonin:4,lacticidven:4) )No results found for this or any previous visit (from the past 240 hour(s)).   Radiological Exams on Admission: DG Chest 2 View  Result Date: 05/27/2020 CLINICAL DATA:  Cough and congestion EXAM: CHEST - 2 VIEW COMPARISON:  12/01/2018 FINDINGS: Cardiac shadow is within normal limits. Mild aortic calcifications are noted. The lungs are well aerated bilaterally. Mild patchy bibasilar scarring is noted similar to that seen on prior CT examination. No focal confluent infiltrate is seen. No bony abnormality is noted. IMPRESSION: No acute abnormality noted. Mild scarring is noted in the bases bilaterally. Electronically Signed   By: Inez Catalina M.D.   On: 05/27/2020 19:35     EKG: I have personally reviewed.  Sinus rhythm, QTC 458, bilateral fascicular block  Assessment/Plan  Principal Problem:   Acute hypoxemic respiratory failure due to COVID-19 [facility]) Active Problems:   GERD (gastroesophageal reflux disease)   Hypertension   HLD (hyperlipidemia)   Acute hypoxemic respiratory failure due to COVID-19 [facility]): Patient has oxygen desaturation to 86% on room air.  Chest x-ray negative -will admit to med-surg bed as inpt -Remdesivir per pharm -Solumedrol 40 mg bid -vitamin C, zinc.  -Bronchodilators -PRN Mucinex for cough -f/u Blood culture -Gentle IV fluid:  -D-dimer, BNP,Trop, LFT, CRP, LDH, Procalcitonin, Ferritin, fibinogen, TG, Hep B SAg -Daily CRP, Ferritin, D-dimer, -Will ask the patient to maintain an awake prone position for 16+ hours a day, if possible, with a minimum of 2-3 hours at a time -Will attempt to maintain euvolemia to a net negative fluid status -IF patient deteriorates, will consult PCCM and ID  GERD (gastroesophageal reflux disease) -Protonix  Hypertension: Patient is not taking his lisinopril currently.  Blood pressure 119/80 -IV hydralazine as  needed  HLD (hyperlipidemia) -Pravastatin          DVT ppx: SQ Lovenox Code Status: Full code Family Communication: Yes, patient's son-in-law by phone Disposition Plan:  Anticipate discharge back to previous environment Consults called: None Admission status: Med-surg bed as inpt     Status is: Inpatient  Remains inpatient appropriate because:Inpatient level of care appropriate due to severity of illness.  Patient has multifocal comorbidities, now presents with acute hypoxia respiratory failure due to COVID-19 infection.  Given his old age, patient is at high risk of deteriorating.  Will need to be treated in the hospital for the next 2 days   Dispo: The patient is from: Home              Anticipated d/c is to: Home              Anticipated d/c date is: 2 days              Patient currently is not medically stable to d/c.          Date of Service 05/28/2020    Ivor Costa [facility]   If 7PM-7AM, please contact night-coverage [email or web address] 05/28/2020, 8:15 AM

## 2020-05-28 NOTE — Progress Notes (Signed)
Remdesivir - Pharmacy Brief Note   A/P:  Remdesivir 200 mg IVPB once followed by 100 mg IVPB daily x 4 days.   Ludy Messamore, PharmD Clinical Pharmacist  

## 2020-05-29 DIAGNOSIS — G9341 Metabolic encephalopathy: Secondary | ICD-10-CM

## 2020-05-29 LAB — MAGNESIUM: Magnesium: 2.5 mg/dL — ABNORMAL HIGH (ref 1.7–2.4)

## 2020-05-29 LAB — FIBRIN DERIVATIVES D-DIMER (ARMC ONLY)
Fibrin derivatives D-dimer (ARMC): 2321.46 ng/mL (FEU) — ABNORMAL HIGH (ref 0.00–499.00)
Fibrin derivatives D-dimer (ARMC): 1903.95 ng/mL (FEU) — ABNORMAL HIGH (ref 0.00–499.00)

## 2020-05-29 LAB — CBC WITH DIFFERENTIAL/PLATELET
Abs Immature Granulocytes: 0.03 10*3/uL (ref 0.00–0.07)
Basophils Absolute: 0 10*3/uL (ref 0.0–0.1)
Basophils Relative: 0 %
Eosinophils Absolute: 0 10*3/uL (ref 0.0–0.5)
Eosinophils Relative: 0 %
HCT: 39.2 % (ref 39.0–52.0)
Hemoglobin: 13 g/dL (ref 13.0–17.0)
Immature Granulocytes: 1 %
Lymphocytes Relative: 19 %
Lymphs Abs: 0.7 10*3/uL (ref 0.7–4.0)
MCH: 29.9 pg (ref 26.0–34.0)
MCHC: 33.2 g/dL (ref 30.0–36.0)
MCV: 90.1 fL (ref 80.0–100.0)
Monocytes Absolute: 0.4 10*3/uL (ref 0.1–1.0)
Monocytes Relative: 9 %
Neutro Abs: 2.8 10*3/uL (ref 1.7–7.7)
Neutrophils Relative %: 71 %
Platelets: 233 10*3/uL (ref 150–400)
RBC: 4.35 MIL/uL (ref 4.22–5.81)
RDW: 13.1 % (ref 11.5–15.5)
WBC: 4 10*3/uL (ref 4.0–10.5)
nRBC: 0 % (ref 0.0–0.2)

## 2020-05-29 LAB — COMPREHENSIVE METABOLIC PANEL
ALT: 24 U/L (ref 0–44)
AST: 33 U/L (ref 15–41)
Albumin: 3 g/dL — ABNORMAL LOW (ref 3.5–5.0)
Alkaline Phosphatase: 53 U/L (ref 38–126)
Anion gap: 11 (ref 5–15)
BUN: 25 mg/dL — ABNORMAL HIGH (ref 8–23)
CO2: 23 mmol/L (ref 22–32)
Calcium: 8.5 mg/dL — ABNORMAL LOW (ref 8.9–10.3)
Chloride: 107 mmol/L (ref 98–111)
Creatinine, Ser: 0.86 mg/dL (ref 0.61–1.24)
GFR, Estimated: >60 mL/min (ref >60)
Glucose, Bld: 123 mg/dL — ABNORMAL HIGH (ref 70–99)
Potassium: 4 mmol/L (ref 3.5–5.1)
Sodium: 141 mmol/L (ref 135–145)
Total Bilirubin: 1 mg/dL (ref 0.3–1.2)
Total Protein: 6.5 g/dL (ref 6.5–8.1)

## 2020-05-29 LAB — BRAIN NATRIURETIC PEPTIDE: B Natriuretic Peptide: 193.2 pg/mL — ABNORMAL HIGH (ref 0.0–100.0)

## 2020-05-29 LAB — EXPECTORATED SPUTUM ASSESSMENT W GRAM STAIN, RFLX TO RESP C

## 2020-05-29 LAB — FIBRINOGEN: Fibrinogen: 615 mg/dL — ABNORMAL HIGH (ref 210–475)

## 2020-05-29 LAB — FERRITIN
Ferritin: 1086 ng/mL — ABNORMAL HIGH (ref 24–336)
Ferritin: 875 ng/mL — ABNORMAL HIGH (ref 24–336)

## 2020-05-29 LAB — HEPATITIS B SURFACE ANTIGEN: Hepatitis B Surface Ag: NONREACTIVE

## 2020-05-29 LAB — C-REACTIVE PROTEIN
CRP: 11.4 mg/dL — ABNORMAL HIGH (ref <1.0)
CRP: 9.2 mg/dL — ABNORMAL HIGH (ref <1.0)

## 2020-05-29 LAB — LACTATE DEHYDROGENASE: LDH: 260 U/L — ABNORMAL HIGH (ref 98–192)

## 2020-05-29 LAB — TRIGLYCERIDES: Triglycerides: 51 mg/dL (ref <150)

## 2020-05-29 LAB — PROCALCITONIN: Procalcitonin: <0.1 ng/mL

## 2020-05-29 MED ORDER — AZITHROMYCIN 250 MG PO TABS: 250.0000 mg | ORAL_TABLET | Freq: Every day | ORAL | Status: DC

## 2020-05-29 MED ORDER — METHYLPREDNISOLONE SODIUM SUCC 40 MG IJ SOLR
40.0000 mg | INTRAMUSCULAR | Status: DC
  Administered 2020-05-30: 40 mg via INTRAVENOUS
  Filled 2020-05-29: qty 1

## 2020-05-29 MED ORDER — POLYVINYL ALCOHOL 1.4 % OP SOLN
2.0000 [drp] | OPHTHALMIC | Status: DC | PRN
  Administered 2020-05-29 – 2020-05-31 (×4): 2 [drp] via OPHTHALMIC
  Filled 2020-05-29 (×2): qty 15

## 2020-05-29 MED ORDER — SODIUM CHLORIDE 0.9 % IV SOLN: 2.0000 g | INTRAVENOUS | Status: DC

## 2020-05-29 MED ORDER — AZITHROMYCIN 250 MG PO TABS: 500.0000 mg | ORAL_TABLET | Freq: Every day | ORAL | Status: DC

## 2020-05-29 MED ORDER — TRAZODONE HCL 50 MG PO TABS
50.0000 mg | ORAL_TABLET | Freq: Every evening | ORAL | Status: DC | PRN
  Filled 2020-05-29: qty 1

## 2020-05-29 NOTE — Progress Notes (Signed)
Physical Therapy Treatment Patient Details Name: Thomas [name] MRN: [id number] DOB: 1930-10-25 Today's Date: 05/29/2020    History of Present Illness VINCIENT VANAMAN is a 87 y.o. male with a history of hypertension and GERD who presents for evaluation of cough.  Patient reports that he has been sick for 2 weeks with a dry cough and congestion.  He reports that his symptoms have been stable but today his son-in-law came to visit and decided to bring him to the hospital to be evaluated.  He denies chest pain or shortness of breath, any known fevers, abdominal pain, vomiting or diarrhea.  He has been ambulatory at home with no difficulties.  He has not been vaccinated against flu or COVID. Pt found to be COVID + and adm with acute hypoxemic RF.    PT Comments    Pt received in bed, very pleasant this pm and anxious to get better.  Pt transferred supine to sit at EOB with Supervision and assist of bed rail. Good sitting dynamic balance while reaching out of BOS. Pt O2 sats 95% on RA.  Sit to stand with CGA to RW.  Pt completed gait training in room with RW and CGA x 20 feet. No LOB noted, pt able to negotiate around objects with increased time to maneuver RW. Pt tolerated session well, appears to be improving strength and mobility wise since admission. Continue PT per POC.    Follow Up Recommendations  Home health PT;Supervision/Assistance - 24 hour     Equipment Recommendations  Rolling walker with 5" wheels;3in1 (PT)    Recommendations for Other Services       Precautions / Restrictions Precautions Precautions: Fall Restrictions Weight Bearing Restrictions: No    Mobility  Bed Mobility Overal bed mobility: Modified Independent                Transfers Overall transfer level: Needs assistance Equipment used: Rolling walker (2 wheeled) Transfers: Sit to/from [employer] Transfers Sit to Stand: Min guard;Supervision Stand pivot transfers: Min guard;Supervision        General transfer comment: cues for safety and hand placement required  Ambulation/Gait Ambulation/Gait assistance: Min guard Gait Distance (Feet): 20 Feet Assistive device: Rolling walker (2 wheeled) Gait Pattern/deviations: Step-through pattern         Stairs             Wheelchair Mobility    Modified Rankin (Stroke Patients Only)       Balance                                            Cognition Arousal/Alertness: Awake/alert Behavior During Therapy: WFL for tasks assessed/performed;Impulsive Overall Cognitive Status: Within Functional Limits for tasks assessed                                 General Comments:  (Pt very chatty but easily redirected.)      Exercises Other Exercises Other Exercises: Pt educated on increased safety awareness and use of call bell for needs    General Comments        Pertinent Vitals/Pain      Home Living                      Prior Function  PT Goals (current goals can now be found in the care plan section) Acute Rehab PT Goals Patient Stated Goal: to get better and go home    Frequency    Min 2X/week      PT Plan      Co-evaluation              AM-PAC PT "6 Clicks" Mobility   Outcome Measure  Help needed turning from your back to your side while in a flat bed without using bedrails?: A Little Help needed moving from lying on your back to sitting on the side of a flat bed without using bedrails?: A Little Help needed moving to and from a bed to a chair (including a wheelchair)?: A Little Help needed standing up from a chair using your arms (e.g., wheelchair or bedside chair)?: A Little Help needed to walk in hospital room?: A Little Help needed climbing 3-5 steps with a railing? : A Lot 6 Click Score: 17    End of Session Equipment Utilized During Treatment: Gait belt Activity Tolerance: Patient tolerated treatment well;Other  (comment);Patient limited by fatigue Patient left: in bed;with call bell/phone within reach Nurse Communication: Mobility status PT Visit Diagnosis: Unsteadiness on feet (R26.81);Other abnormalities of gait and mobility (R26.89);Muscle weakness (generalized) (M62.81)     Time: 1600-1630 PT Time Calculation (min) (ACUTE ONLY): 30 min  Charges:                        Mikel Cella, PTA   Josie Dixon 05/29/2020, 5:51 PM

## 2020-05-29 NOTE — TOC Initial Note (Signed)
Transition of Care [facility]) - Initial/Assessment Note    Patient Details  Name: Thomas [name] MRN: [id number] Date of Birth: [birth year]  Transition of Care [facility]) CM/SW Contact:    Beverly Sessions, RN Phone Number: 05/29/2020, 2:49 PM  Clinical Narrative:                 Patient admitted from home with covid Patient lives at home alone Patient currently confused.  Assessment completed via phone with daughter.  Per daughter at baseline patient is A&O x4.  She states the MD told her it may be a medication interaction  Daughter lives locally for support  PCP [place] - denies issues obtaining medications  Family provides transportation to appointments  PT has assessed patient and recommends home health.  Patient has RW, cane, shower seat in the home.  Will need BSC. At discharge  Daughter states that she does not have a preference of home health agency.  Referral made to [facility] with [place].  Current recommendations for 24/7 supervision.  Daughter notified.  Daughter states that she anticipates his mental status to improve and discharge home alone   Expected Discharge Plan: [place]     Patient Goals and CMS Choice        Expected Discharge Plan and Services Expected Discharge Plan: [place]   Discharge Planning Services: CM Consult   Living arrangements for the past 2 months: [place]: RN,PT,OT [place] Agency: [place] ([place]) Date HH Agency Contacted: 05/29/20   Representative spoke with at [place]: Corene Cornea  Prior Living Arrangements/Services Living arrangements for the past 2 months: [place] with:: Self Patient language and need for interpreter reviewed:: Yes        Need for Family Participation in Patient Care: Yes (Comment) Care giver support system in place?: Yes (comment) Current home services: DME Criminal Activity/Legal  Involvement Pertinent to Current Situation/Hospitalization: No - Comment as needed  Activities of Daily Living Home Assistive Devices/Equipment: Cane (specify quad or straight),Dentures (specify type),Walker (specify type),Shower chair with back ADL Screening (condition at time of admission) Patient's cognitive ability adequate to safely complete daily activities?: Yes Is the patient deaf or have difficulty hearing?: Yes Does the patient have difficulty seeing, even when wearing glasses/contacts?: No Does the patient have difficulty concentrating, remembering, or making decisions?: No Patient able to express need for assistance with ADLs?: Yes Does the patient have difficulty dressing or bathing?: No Independently performs ADLs?: Yes (appropriate for developmental age) Does the patient have difficulty walking or climbing stairs?: No Weakness of Legs: None Weakness of Arms/Hands: None  Permission Sought/Granted                  Emotional Assessment       Orientation: : Oriented to Self      Admission diagnosis:  Acute respiratory failure with hypoxia ([place]) [J96.01] Acute hypoxemic respiratory failure due to COVID-19 ([place]) [U07.1, J96.01] Acute respiratory disease due to COVID-19 virus [U07.1, J06.9] COVID-19 [U07.1] Patient Active Problem List   Diagnosis Date Noted  . Acute hypoxemic respiratory failure due to COVID-19 ([place]) 05/28/2020  . GERD (gastroesophageal reflux disease)   . Hypertension   . HLD (hyperlipidemia)   . Multiple lung nodules 12/05/2018  . Mass of right  side of neck 01/27/2018   PCP:  Tracie Harrier, MD Pharmacy:   [id number] [place], [address] [place] [address] Phone: [phone] Fax: [phone]     Social Determinants of Health (SDOH) Interventions    Readmission Risk Interventions No flowsheet data found.

## 2020-05-29 NOTE — Progress Notes (Signed)
Patient ID: Thomas [name], male   DOB: Oct 07, 1930, 87 y.o.   MRN: [id number] [facility] PROGRESS NOTE  ORDELL PRICHETT YQM:[id number] DOB: 06/15/1930 DOA: 05/28/2020 PCP: Tracie Harrier, MD  HPI/Subjective: Patient admitted yesterday with acute respiratory failure secondary to COVID-19 infection.  Patient feels better than when he came in.  States he is eating and drinking okay.  Daughter noticed some confusion.  Objective: Vitals:   05/29/20 1130 05/29/20 1503  BP: (!) 118/91 116/79  Pulse: 87 89  Resp: 17   Temp: 97.8 F (36.6 C) 98 F (36.7 C)  SpO2: 94% 93%    Intake/Output Summary (Last 24 hours) at 05/29/2020 1654 Last data filed at 05/29/2020 1500 Gross per 24 hour  Intake --  Output 800 ml  Net -800 ml   Filed Weights   05/27/20 1859  Weight: 70.3 kg    ROS: Review of Systems  Respiratory: Positive for cough and shortness of breath.   Cardiovascular: Negative for chest pain.  Gastrointestinal: Negative for abdominal pain, nausea and vomiting.   Exam: Physical Exam HENT:     Head: Normocephalic.     Mouth/Throat:     Pharynx: No oropharyngeal exudate.  Eyes:     General: Lids are normal.     Conjunctiva/sclera: Conjunctivae normal.  Cardiovascular:     Rate and Rhythm: Normal rate and regular rhythm.     Heart sounds: Normal heart sounds, S1 normal and S2 normal.  Pulmonary:     Breath sounds: Examination of the right-lower field reveals decreased breath sounds. Examination of the left-lower field reveals decreased breath sounds. Decreased breath sounds present. No wheezing, rhonchi or rales.  Abdominal:     Palpations: Abdomen is soft.     Tenderness: There is no abdominal tenderness.  Musculoskeletal:     Right lower leg: No swelling.     Left lower leg: No swelling.  Skin:    General: Skin is warm.     Findings: No rash.  Neurological:     Mental Status: He is alert.     Comments: Answers questions appropriately.       Data  Reviewed: Basic Metabolic Panel: Recent Labs  Lab 05/27/20 1914 05/29/20 0557  NA 137 141  K 3.9 4.0  CL 105 107  CO2 22 23  GLUCOSE 106* 123*  BUN 18 25*  CREATININE 1.08 0.86  CALCIUM 8.6* 8.5*  MG  --  2.5*   Liver Function Tests: Recent Labs  Lab 05/27/20 2310 05/29/20 0557  AST 41 33  ALT 28 24  ALKPHOS 59 53  BILITOT 1.0 1.0  PROT 7.1 6.5  ALBUMIN 3.5 3.0*   CBC: Recent Labs  Lab 05/27/20 1914 05/29/20 0557  WBC 4.9 4.0  NEUTROABS  --  2.8  HGB 14.6 13.0  HCT 43.4 39.2  MCV 89.7 90.1  PLT 204 233   BNP (last 3 results) Recent Labs    05/28/20 2344  BNP 193.2*      Recent Results (from the past 240 hour(s))  Culture, blood (Routine X 2) w Reflex to ID Panel     Status: None (Preliminary result)   Collection Time: 05/28/20 11:44 PM   Specimen: BLOOD  Result Value Ref Range Status   Specimen Description BLOOD RIGHT ASSIST CONTROL  Final   Special Requests   Final    BOTTLES DRAWN AEROBIC AND ANAEROBIC Blood Culture results may not be optimal due to an excessive volume of blood received in culture bottles  Culture   Final    NO GROWTH < 12 HOURS Performed at [facility], [address] [place], [place] [address]    Report Status PENDING  Incomplete  Culture, blood (Routine X 2) w Reflex to ID Panel     Status: None (Preliminary result)   Collection Time: 05/28/20 11:53 PM   Specimen: BLOOD  Result Value Ref Range Status   Specimen Description BLOOD LEFT ASSIST CONTROL  Final   Special Requests   Final    BOTTLES DRAWN AEROBIC AND ANAEROBIC Blood Culture results may not be optimal due to an excessive volume of blood received in culture bottles   Culture   Final    NO GROWTH < 12 HOURS Performed at [facility], [address] [place], [place] [address]    Report Status PENDING  Incomplete     Studies: DG Chest 2 View  Result Date: 05/27/2020 CLINICAL DATA:  Cough and congestion EXAM: CHEST - 2 VIEW COMPARISON:   12/01/2018 FINDINGS: Cardiac shadow is within normal limits. Mild aortic calcifications are noted. The lungs are well aerated bilaterally. Mild patchy bibasilar scarring is noted similar to that seen on prior CT examination. No focal confluent infiltrate is seen. No bony abnormality is noted. IMPRESSION: No acute abnormality noted. Mild scarring is noted in the bases bilaterally. Electronically Signed   By: Alcide CleverMark  Lukens M.D.   On: 05/27/2020 19:35    Scheduled Meds: . vitamin C  500 mg Oral Daily  . aspirin EC  81 mg Oral Daily  . enoxaparin (LOVENOX) injection  40 mg Subcutaneous Q24H  . ipratropium  2 puff Inhalation Q6H  . methylPREDNISolone (SOLU-MEDROL) injection  40 mg Intravenous Q12H  . pantoprazole  40 mg Oral Daily  . pravastatin  40 mg Oral q1800  . zinc sulfate  220 mg Oral Daily   Continuous Infusions: . remdesivir 100 mg in NS 100 mL 100 mg (05/29/20 0928)    Assessment/Plan:  1. Acute hypoxic respiratory failure secondary to COVID-19 infection.  Patient did have a pulse ox of 86% on room air.  When I saw him today he was breathing comfortably on room air.  Continue remdesivir and steroids but decrease steroids down to daily dosing.  Patient CRP elevated but coming down.  We will check again tomorrow.  Repeat chest x-ray tomorrow morning pulse ox room air with ambulation 2. Acute metabolic encephalopathy could be secondary to steroids.  Send off urine analysis and urine culture.  Blood cultures so far negative.  Patient is coughing up some brownish phlegm which was sent off for culture. 3. GERD on PPI 4. Hyperlipidemia unspecified on pravastatin 5. Weakness.  Physical therapy recommended home health with supervision and 24-hour assistance.  Patient does live alone        Code Status:     Code Status Orders  (From admission, onward)         Start     Ordered   05/28/20 2149  Full code  Continuous        05/28/20 2148        Code Status History    This patient  has a current code status but no historical code status.   Advance Care Planning Activity     Family Communication: Spoke with daughter on the phone Disposition Plan: Status is: Inpatient  Dispo: The patient is from: Home              Anticipated d/c is to: Home  with home health              Anticipated d/c date is: Evaluate on a daily basis.              Patient currently would like to see confusion be a little bit better.  Would like to see on room air with ambulation.  Time spent: 28 minutes  [place]

## 2020-05-30 ENCOUNTER — Inpatient Hospital Stay: Payer: Medicare (Managed Care)

## 2020-05-30 DIAGNOSIS — G4700 Insomnia, unspecified: Secondary | ICD-10-CM

## 2020-05-30 DIAGNOSIS — J9601 Acute respiratory failure with hypoxia: Secondary | ICD-10-CM

## 2020-05-30 LAB — URINALYSIS, COMPLETE (UACMP) WITH MICROSCOPIC
Bacteria, UA: NONE SEEN
Bilirubin Urine: NEGATIVE
Glucose, UA: NEGATIVE mg/dL
Hgb urine dipstick: NEGATIVE
Ketones, ur: NEGATIVE mg/dL
Leukocytes,Ua: NEGATIVE
Nitrite: NEGATIVE
Protein, ur: NEGATIVE mg/dL
Specific Gravity, Urine: 1.027 (ref 1.005–1.030)
pH: 5 (ref 5.0–8.0)

## 2020-05-30 LAB — CBC WITH DIFFERENTIAL/PLATELET
Abs Immature Granulocytes: 0.05 10*3/uL (ref 0.00–0.07)
Basophils Absolute: 0 10*3/uL (ref 0.0–0.1)
Basophils Relative: 0 %
Eosinophils Absolute: 0 10*3/uL (ref 0.0–0.5)
Eosinophils Relative: 0 %
HCT: 43.4 % (ref 39.0–52.0)
Hemoglobin: 14.5 g/dL (ref 13.0–17.0)
Immature Granulocytes: 1 %
Lymphocytes Relative: 13 %
Lymphs Abs: 1 10*3/uL (ref 0.7–4.0)
MCH: 30.1 pg (ref 26.0–34.0)
MCHC: 33.4 g/dL (ref 30.0–36.0)
MCV: 90 fL (ref 80.0–100.0)
Monocytes Absolute: 0.5 10*3/uL (ref 0.1–1.0)
Monocytes Relative: 6 %
Neutro Abs: 6.1 10*3/uL (ref 1.7–7.7)
Neutrophils Relative %: 80 %
Platelets: 287 10*3/uL (ref 150–400)
RBC: 4.82 MIL/uL (ref 4.22–5.81)
RDW: 13.1 % (ref 11.5–15.5)
WBC: 7.6 10*3/uL (ref 4.0–10.5)
nRBC: 0 % (ref 0.0–0.2)

## 2020-05-30 LAB — COMPREHENSIVE METABOLIC PANEL
ALT: 24 U/L (ref 0–44)
AST: 39 U/L (ref 15–41)
Albumin: 3.2 g/dL — ABNORMAL LOW (ref 3.5–5.0)
Alkaline Phosphatase: 53 U/L (ref 38–126)
Anion gap: 10 (ref 5–15)
BUN: 28 mg/dL — ABNORMAL HIGH (ref 8–23)
CO2: 26 mmol/L (ref 22–32)
Calcium: 8.9 mg/dL (ref 8.9–10.3)
Chloride: 106 mmol/L (ref 98–111)
Creatinine, Ser: 0.94 mg/dL (ref 0.61–1.24)
GFR, Estimated: >60 mL/min (ref >60)
Glucose, Bld: 80 mg/dL (ref 70–99)
Potassium: 4 mmol/L (ref 3.5–5.1)
Sodium: 142 mmol/L (ref 135–145)
Total Bilirubin: 1 mg/dL (ref 0.3–1.2)
Total Protein: 7 g/dL (ref 6.5–8.1)

## 2020-05-30 LAB — FERRITIN: Ferritin: 1052 ng/mL — ABNORMAL HIGH (ref 24–336)

## 2020-05-30 LAB — C-REACTIVE PROTEIN: CRP: 4.9 mg/dL — ABNORMAL HIGH (ref <1.0)

## 2020-05-30 LAB — MAGNESIUM: Magnesium: 2.3 mg/dL (ref 1.7–2.4)

## 2020-05-30 LAB — PROCALCITONIN: Procalcitonin: <0.1 ng/mL

## 2020-05-30 LAB — D-DIMER, QUANTITATIVE: D-Dimer, Quant: 1.67 ug/mL-FEU — ABNORMAL HIGH (ref 0.00–0.50)

## 2020-05-30 MED ORDER — TRAZODONE HCL 50 MG PO TABS
50.0000 mg | ORAL_TABLET | Freq: Every day | ORAL | Status: DC
  Administered 2020-05-30: 50 mg via ORAL
  Filled 2020-05-30: qty 1

## 2020-05-30 NOTE — Progress Notes (Signed)
Physical Therapy Treatment Patient Details Name: Thomas [name] MRN: [id number] DOB: 05/06/1931 Today's Date: 05/30/2020    History of Present Illness Thomas [name] is a 87 y.o. male with a history of hypertension and GERD who presents for evaluation of cough.  Patient reports that he has been sick for 2 weeks with a dry cough and congestion.  He reports that his symptoms have been stable but today his son-in-law came to visit and decided to bring him to the hospital to be evaluated.  He denies chest pain or shortness of breath, any known fevers, abdominal pain, vomiting or diarrhea.  He has been ambulatory at home with no difficulties.  He has not been vaccinated against flu or COVID. Pt found to be COVID + and adm with acute hypoxemic RF.    PT Comments    Pt tolerated treatment well today and was able to improve overall assistance levels, ambulation distance, standing dynamic balance, and activity tolerance since last session. Pt required min guard for transfers with cueing for positioning, and supervision for safety with short distance ambulation in RW; pt able to improve quality of gait with cues for increased step length and RW proximity/negotiation. Pt continues to present with decreased cardiopulmonary tolerance to activity, due to SpO2 desat to 86% after ambulating 35ft with RW; pt able to recover quickly with cues for PLB. Intermittent desaturation noted during seated therex at EOB, but pt able to recover without PLB. Pt will continue to benefit from skilled acute PT services to address deficits for return to baseline function. Will continue to recommend HHPT with 24/7 supervision/assistance as needed.    Follow Up Recommendations  Home health PT;Supervision/Assistance - 24 hour     Equipment Recommendations  Rolling walker with 5" wheels;3in1 (PT)    Recommendations for Other Services       Precautions / Restrictions Precautions Precautions: Fall Restrictions Weight Bearing  Restrictions: No    Mobility  Bed Mobility Overal bed mobility: Modified Independent             General bed mobility comments: Mod I to perform supine<>sit transfers with use of BUE for support  Transfers Overall transfer level: Needs assistance Equipment used: Rolling walker (2 wheeled) Transfers: Sit to/from Stand Sit to Stand: Min guard         General transfer comment: Min guard to perform STS from EOB with RW; verbal cues provided for hand placement and positioning to ensure safety with mobility.  Ambulation/Gait Ambulation/Gait assistance: Supervision Gait Distance (Feet): 50 Feet (8ft x2) Assistive device: Rolling walker (2 wheeled)   Gait velocity: decreased   General Gait Details: Supervision for safety to ambulate in room with RW. Pt demonstrated step to gait pattern with decreased foot clearance/stance on LLE (pt attributes to TKA). Cues for RW proximity/negotiation and increased step length. x1 seated rest break.     Balance Overall balance assessment: Needs assistance Sitting-balance support: Feet unsupported;Single extremity supported Sitting balance-[name]: Fair Sitting balance - Comments: Fair seated balance at EOB without LOB     Standing balance-[name]: Fair Standing balance comment: Fair standing balance in RW requiring supervision for safety                            Cognition Arousal/Alertness: Awake/alert Behavior During Therapy: [facility] for tasks assessed/performed;Impulsive Overall Cognitive Status: Within Functional Limits for tasks assessed  General Comments: Pt alert and able to follow 100% of simple 2-step commands; intermittent redirection required as pt is chatty      Exercises Other Exercises Other Exercises: Pt able to participate in bed mobility, transfers, and short distance ambulation with RW x2 during session, requiring grossly min guard - supervision for  safety. Pt with x1 seated rest break and desaturation of SpO2 to 86% on RA; pt able to recover with cues for PLB. Other Exercises: Pt able to perform x10 BLE exercises including: ankle pumps, LAQ, marching, glute sets, and isometric hip ADD. Increased cueing for attention to task. Intermittent desat to 88%, but pt able to recover quickly without PLB.    General Comments        Pertinent Vitals/Pain Pain Assessment: No/denies pain           PT Goals (current goals can now be found in the care plan section) Acute Rehab PT Goals Patient Stated Goal: to get better and go home PT Goal Formulation: With patient Time For Goal Achievement: 06/11/20 Potential to Achieve Goals: Fair Progress towards PT goals: Progressing toward goals    Frequency    Min 2X/week      PT Plan Current plan remains appropriate    Co-evaluation              AM-PAC PT "6 Clicks" Mobility   Outcome Measure  Help needed turning from your back to your side while in a flat bed without using bedrails?: None Help needed moving from lying on your back to sitting on the side of a flat bed without using bedrails?: None Help needed moving to and from a bed to a chair (including a wheelchair)?: A Little Help needed standing up from a chair using your arms (e.g., wheelchair or bedside chair)?: A Little Help needed to walk in hospital room?: A Little Help needed climbing 3-5 steps with a railing? : A Lot 6 Click Score: 19    End of Session Equipment Utilized During Treatment: Gait belt Activity Tolerance: Patient tolerated treatment well Patient left: in bed;with call bell/phone within reach;with nursing/sitter in room;with bed alarm set Nurse Communication: Mobility status PT Visit Diagnosis: Unsteadiness on feet (R26.81);Other abnormalities of gait and mobility (R26.89);Muscle weakness (generalized) (M62.81)     Time: 0240-9735 PT Time Calculation (min) (ACUTE ONLY): 30 min  Charges:  $Therapeutic  Exercise: 8-22 mins $Therapeutic Activity: 8-22 mins                     Herminio Commons, PT, DPT 3:23 PM,05/30/20

## 2020-05-30 NOTE — Progress Notes (Signed)
Patient ID: Thomas [name], male   DOB: [name], 87 y.o.   MRN: [id number] [facility] PROGRESS NOTE  Thomas [name] PNT:[id number] DOB: Thomas [name] DOA: 05/28/2020 PCP: Thomas Harrier, MD  HPI/Subjective: Patient seen this morning and did not feel well.  Patient was slow to respond with some questions and was little bit confused at first.  After waking up he was able to answer a few more questions.  Patient's daughter states that he is still confused.  Objective: Vitals:   05/30/20 1513 05/30/20 1514  BP:    Pulse:    Resp:    Temp:    SpO2: 90% (!) 86%    Intake/Output Summary (Last 24 hours) at 05/30/2020 1547 Last data filed at 05/30/2020 0700 Gross per 24 hour  Intake 100 ml  Output 450 ml  Net -350 ml   Filed Weights   05/27/20 1859  Weight: 70.3 kg    ROS: Review of Systems  Constitutional: Positive for malaise/fatigue.  Respiratory: Positive for shortness of breath.   Cardiovascular: Negative for chest pain.  Gastrointestinal: Negative for abdominal pain, nausea and vomiting.   Exam: Physical Exam HENT:     Head: Normocephalic.     Mouth/Throat:     Pharynx: No oropharyngeal exudate.  Eyes:     General: Lids are normal.     Conjunctiva/sclera: Conjunctivae normal.     Pupils: Pupils are equal, round, and reactive to light.  Cardiovascular:     Rate and Rhythm: Normal rate and regular rhythm.     Heart sounds: Normal heart sounds, S1 normal and S2 normal.  Pulmonary:     Breath sounds: Examination of the right-lower field reveals decreased breath sounds. Examination of the left-lower field reveals decreased breath sounds. Decreased breath sounds present. No wheezing, rhonchi or rales.  Abdominal:     Palpations: Abdomen is soft.     Tenderness: There is no abdominal tenderness.  Musculoskeletal:     Right lower leg: No swelling.     Left lower leg: No swelling.  Skin:    General: Skin is warm.     Findings: No rash.  Neurological:     Mental  Status: He is alert and oriented to person, place, and time.       Data Reviewed: Basic Metabolic Panel: Recent Labs  Lab 05/27/20 1914 05/29/20 0557 05/30/20 0519  NA 137 141 142  K 3.9 4.0 4.0  CL 105 107 106  CO2 22 23 26   GLUCOSE 106* 123* 80  BUN 18 25* 28*  CREATININE 1.08 0.86 0.94  CALCIUM 8.6* 8.5* 8.9  MG  --  2.5* 2.3   Liver Function Tests: Recent Labs  Lab 05/27/20 2310 05/29/20 0557 05/30/20 0519  AST 41 33 39  ALT 28 24 24   ALKPHOS 59 53 53  BILITOT 1.0 1.0 1.0  PROT 7.1 6.5 7.0  ALBUMIN 3.5 3.0* 3.2*   CBC: Recent Labs  Lab 05/27/20 1914 05/29/20 0557 05/30/20 0519  WBC 4.9 4.0 7.6  NEUTROABS  --  2.8 6.1  HGB 14.6 13.0 14.5  HCT 43.4 39.2 43.4  MCV 89.7 90.1 90.0  PLT 204 233 287   BNP (last 3 results) Recent Labs    05/28/20 2344  BNP 193.2*      Recent Results (from the past 240 hour(s))  Culture, sputum-assessment     Status: None   Collection Time: 05/28/20  6:10 PM   Specimen: Sputum  Result Value Ref Range Status  Specimen Description SPU  Final   Special Requests NONE  Final   Sputum evaluation   Final    Sputum specimen not acceptable for testing.  Please recollect.   STACY CLAY AT 2059 ON 05/29/20 BY SS Performed at [facility], [address] [place], [place] [address]    Report Status 05/29/2020 FINAL  Final  Culture, blood (Routine X 2) w Reflex to ID Panel     Status: None (Preliminary result)   Collection Time: 05/28/20 11:44 PM   Specimen: BLOOD  Result Value Ref Range Status   Specimen Description BLOOD RIGHT ASSIST CONTROL  Final   Special Requests   Final    BOTTLES DRAWN AEROBIC AND ANAEROBIC Blood Culture results Thomas not be optimal due to an excessive volume of blood received in culture bottles   Culture   Final    NO GROWTH 1 DAY Performed at [facility], [address] [place], [place] [address]    Report Status PENDING  Incomplete  Culture, blood (Routine X 2) w Reflex to  ID Panel     Status: None (Preliminary result)   Collection Time: 05/28/20 11:53 PM   Specimen: BLOOD  Result Value Ref Range Status   Specimen Description BLOOD LEFT ASSIST CONTROL  Final   Special Requests   Final    BOTTLES DRAWN AEROBIC AND ANAEROBIC Blood Culture results Thomas not be optimal due to an excessive volume of blood received in culture bottles   Culture   Final    NO GROWTH 1 DAY Performed at [facility], [address] [place], [place] [address]    Report Status PENDING  Incomplete     Studies: DG Chest Port 1 View  Result Date: 05/30/2020 CLINICAL DATA:  Cough. EXAM: PORTABLE CHEST 1 VIEW COMPARISON:  05/27/2020.  CT 12/01/2018. FINDINGS: Cardiomegaly. No pulmonary venous congestion. Low lung volumes with mild bibasilar atelectasis. Mild basilar infiltrates cannot be excluded. Nodular opacity noted over the right lung base, most likely a nipple shadow. Follow-up chest x-ray with nipple markers suggested. No pleural effusion or pneumothorax. Degenerative changes scoliosis thoracic spine. IMPRESSION: 1. Cardiomegaly. No pulmonary venous congestion. 2. Low lung volumes with mild bibasilar atelectasis. Mild basilar infiltrates cannot be excluded. 3. Nodular opacity noted over the right lung base, most likely a nipple shadow. Follow-up chest x-ray with nipple markers suggested. Electronically Signed   By: Thomas [name]  Register   On: 05/30/2020 05:51    Scheduled Meds: . vitamin C  500 mg Oral Daily  . aspirin EC  81 mg Oral Daily  . enoxaparin (LOVENOX) injection  40 mg Subcutaneous Q24H  . ipratropium  2 puff Inhalation Q6H  . pantoprazole  40 mg Oral Daily  . pravastatin  40 mg Oral q1800  . traZODone  50 mg Oral QHS  . zinc sulfate  220 mg Oral Daily   Continuous Infusions: . remdesivir 100 mg in NS 100 mL 100 mg (05/30/20 1348)    Assessment/Plan:  1. Acute hypoxic respiratory failure secondary to COVID-19 infection.  Patient initially had a pulse ox of 86 on  room air.  Today with ambulating did drop his saturations down to 86%.  Continue remdesivir.  Discontinue steroids with altered mental status. 2. Acute metabolic encephalopathy.  Likely secondary to being in unfamiliar environment and secondary to steroids.  Blood cultures negative.  Urine analysis negative. 3. GERD on PPI 4. Hyperlipidemia unspecified on pravastatin 5. Weakness.  Physical therapy recommended home health with 24-hour  supervision.  Patient does live alone.  Spoke with the patient's daughter on the phone.  She and her sister will help out. 6. Insomnia.  Trial of trazodone at night        Code Status:     Code Status Orders  (From admission, onward)         Start     Ordered   05/28/20 2149  Full code  Continuous        05/28/20 2148        Code Status History    This patient has a current code status but no historical code status.   Advance Care Planning Activity     Family Communication: Spoke with patient's daughter on the phone Disposition Plan: Status is: Inpatient  Dispo: The patient is from: Home              Anticipated d/c is to: Home with home health              Anticipated d/c date is: Potentially 06/01/2019              Patient currently did get hypoxic with walking.  Would like to send home without oxygen.  Patient was more confused this morning than yesterday.  Spoke with patient's daughter and they would like to get him home.  We will reevaluate tomorrow.  Holding off on steroids.  Time spent: 28 minutes  [place]

## 2020-05-31 DIAGNOSIS — R531 Weakness: Secondary | ICD-10-CM

## 2020-05-31 LAB — COMPREHENSIVE METABOLIC PANEL
ALT: 47 U/L — ABNORMAL HIGH (ref 0–44)
AST: 61 U/L — ABNORMAL HIGH (ref 15–41)
Albumin: 3 g/dL — ABNORMAL LOW (ref 3.5–5.0)
Alkaline Phosphatase: 54 U/L (ref 38–126)
Anion gap: 11 (ref 5–15)
BUN: 24 mg/dL — ABNORMAL HIGH (ref 8–23)
CO2: 23 mmol/L (ref 22–32)
Calcium: 8.5 mg/dL — ABNORMAL LOW (ref 8.9–10.3)
Chloride: 106 mmol/L (ref 98–111)
Creatinine, Ser: 0.96 mg/dL (ref 0.61–1.24)
GFR, Estimated: >60 mL/min (ref >60)
Glucose, Bld: 78 mg/dL (ref 70–99)
Potassium: 3.9 mmol/L (ref 3.5–5.1)
Sodium: 140 mmol/L (ref 135–145)
Total Bilirubin: 0.9 mg/dL (ref 0.3–1.2)
Total Protein: 6.4 g/dL — ABNORMAL LOW (ref 6.5–8.1)

## 2020-05-31 LAB — FERRITIN: Ferritin: 717 ng/mL — ABNORMAL HIGH (ref 24–336)

## 2020-05-31 LAB — CBC WITH DIFFERENTIAL/PLATELET
Abs Immature Granulocytes: 0.02 10*3/uL (ref 0.00–0.07)
Basophils Absolute: 0 10*3/uL (ref 0.0–0.1)
Basophils Relative: 0 %
Eosinophils Absolute: 0 10*3/uL (ref 0.0–0.5)
Eosinophils Relative: 0 %
HCT: 40.9 % (ref 39.0–52.0)
Hemoglobin: 13.7 g/dL (ref 13.0–17.0)
Immature Granulocytes: 0 %
Lymphocytes Relative: 16 %
Lymphs Abs: 1 10*3/uL (ref 0.7–4.0)
MCH: 30 pg (ref 26.0–34.0)
MCHC: 33.5 g/dL (ref 30.0–36.0)
MCV: 89.7 fL (ref 80.0–100.0)
Monocytes Absolute: 0.5 10*3/uL (ref 0.1–1.0)
Monocytes Relative: 8 %
Neutro Abs: 4.7 10*3/uL (ref 1.7–7.7)
Neutrophils Relative %: 76 %
Platelets: 279 10*3/uL (ref 150–400)
RBC: 4.56 MIL/uL (ref 4.22–5.81)
RDW: 13.2 % (ref 11.5–15.5)
Smear Review: NORMAL
WBC: 6.3 10*3/uL (ref 4.0–10.5)
nRBC: 0 % (ref 0.0–0.2)

## 2020-05-31 LAB — URINE CULTURE: Culture: NO GROWTH

## 2020-05-31 LAB — MAGNESIUM: Magnesium: 2.2 mg/dL (ref 1.7–2.4)

## 2020-05-31 LAB — VITAMIN B12: Vitamin B-12: 275 pg/mL (ref 180–914)

## 2020-05-31 LAB — FIBRIN DERIVATIVES D-DIMER (ARMC ONLY): Fibrin derivatives D-dimer (ARMC): 1225.3 ng/mL (FEU) — ABNORMAL HIGH (ref 0.00–499.00)

## 2020-05-31 LAB — C-REACTIVE PROTEIN: CRP: 5.5 mg/dL — ABNORMAL HIGH (ref <1.0)

## 2020-05-31 LAB — TSH: TSH: 0.649 u[IU]/mL (ref 0.350–4.500)

## 2020-05-31 LAB — PROCALCITONIN: Procalcitonin: <0.1 ng/mL

## 2020-05-31 MED ORDER — CIPROFLOXACIN HCL 0.3 % OP SOLN
1.0000 [drp] | OPHTHALMIC | Status: DC
  Administered 2020-05-31: 1 [drp] via OPHTHALMIC
  Filled 2020-05-31: qty 2.5

## 2020-05-31 MED ORDER — ASCORBIC ACID 500 MG PO TABS: 500.0000 mg | ORAL_TABLET | Freq: Every day | ORAL | 0 refills | Status: DC

## 2020-05-31 MED ORDER — ALBUTEROL SULFATE HFA 108 (90 BASE) MCG/ACT IN AERS: 2.0000 | INHALATION_SPRAY | RESPIRATORY_TRACT | 0 refills | Status: DC | PRN

## 2020-05-31 MED ORDER — PREDNISONE 20 MG PO TABS: 20.0000 mg | ORAL_TABLET | Freq: Every day | ORAL | 0 refills | Status: DC

## 2020-05-31 MED ORDER — ACETAMINOPHEN 325 MG PO TABS
650.0000 mg | ORAL_TABLET | Freq: Four times a day (QID) | ORAL | Status: AC | PRN
Start: 2020-05-31 — End: ?

## 2020-05-31 MED ORDER — PREDNISONE 20 MG PO TABS
20.0000 mg | ORAL_TABLET | Freq: Every day | ORAL | Status: DC
  Administered 2020-05-31: 20 mg via ORAL
  Filled 2020-05-31: qty 1

## 2020-05-31 MED ORDER — ZINC SULFATE 220 (50 ZN) MG PO CAPS: 220.0000 mg | ORAL_CAPSULE | Freq: Every day | ORAL | 0 refills | Status: DC

## 2020-05-31 MED ORDER — CIPROFLOXACIN HCL 0.3 % OP SOLN: 1.0000 [drp] | OPHTHALMIC | 0 refills | Status: DC

## 2020-05-31 MED ORDER — TRAZODONE HCL 50 MG PO TABS: 50.0000 mg | ORAL_TABLET | Freq: Every evening | ORAL | 0 refills | Status: DC | PRN

## 2020-05-31 NOTE — Discharge Instructions (Signed)

## 2020-05-31 NOTE — TOC Transition Note (Signed)
Transition of Care [facility] - CM/SW Discharge Note   Patient Details  Name: Thomas [name] MRN: [id number] Date of Birth: Mar 08, 1931  Transition of Care [facility]) CM/SW Contact:  Beverly Sessions, RN Phone Number: 05/31/2020, 2:45 PM   Clinical Narrative:    Patient to discharge home today Bedside RN to contact daughter to notify Patient does not qualify for home o2.  Maintained sats at 90 percent on RA BSC delivered to room  [facility] with [place] notified of discharge Per MD patient mental status improving.    Final next level of care: [place] Barriers to Discharge: No Barriers Identified   Patient Goals and CMS Choice        Discharge Placement                       Discharge Plan and Services   Discharge Planning Services: CM Consult            DME Arranged: 3-N-1 DME Agency: [employer] Date DME Agency Contacted: 05/31/20   Representative spoke with at DME Agency: [place]: RN,PT,OT,Nurse's Aide [place]: [place] ([place]) Date [place]: 05/31/20   Representative spoke with at [place]: [place] ([place]) Interventions     Readmission Risk Interventions No flowsheet data found.

## 2020-05-31 NOTE — Discharge Summary (Signed)
[facility] NAME: Thomas [name]    MR#:  [id number]  DATE OF BIRTH:  [birth year]  DATE OF ADMISSION:  05/28/2020 ADMITTING PHYSICIAN: Ivor Costa, MD  DATE OF DISCHARGE: 05/31/2020  PRIMARY CARE PHYSICIAN: Tracie Harrier, MD    ADMISSION DIAGNOSIS:  Acute respiratory failure with hypoxia ([place]) [J96.01] Acute hypoxemic respiratory failure due to COVID-19 ([facility]) [U07.1, J96.01] Acute respiratory disease due to COVID-19 virus [U07.1, J06.9] COVID-19 [U07.1]  DISCHARGE DIAGNOSIS:  Principal Problem:   COVID-19 Active Problems:   GERD (gastroesophageal reflux disease)   Hypertension   HLD (hyperlipidemia)   Acute metabolic encephalopathy   Acute respiratory failure with hypoxia ([place])   Insomnia   SECONDARY DIAGNOSIS:   Past Medical History:  Diagnosis Date  . Arthritis   . GERD (gastroesophageal reflux disease)   . Hypertension     HOSPITAL COURSE:   1.  Acute hypoxic respiratory failure secondary to COVID-19 infection.  Patient initially had a pulse ox of 86% on room air.  Yesterday with ambulation he did drop his saturations down to 86%.  Today with ambulation he was able to hold his saturations above 90%.  Patient stable for discharge home.  The patient was given 4 days of IV remdesivir.  Patient initially was given steroids but then developed acute metabolic encephalopathy.  In speaking with the patient's daughter she would like me to go ahead and prescribe oral steroids.  If any worsening mental status with the oral steroids can discontinue.  Mentioned that family taking care of him will have to be stay safe with wearing masks and taking precautions. 2.  Acute metabolic encephalopathy likely secondary to being in an unfamiliar environment and secondary to steroids.  Blood cultures were negative.  Urine analysis was negative.  I did give the patient some trazodone last night and he slept well and mental status much better on the  day of discharge. 3.  GERD on PPI 4.  Hyperlipidemia unspecified on lovastatin 5.  Weakness.  Physical therapy recommended home health.  We set up home health upon discharge home. 6.  Insomnia.  Trial of trazodone at night.  He slept well last night with it we will leave as needed at home. 7.  Some drainage right eye.  Ciloxan eyedrops.  8.  Slightly elevated liver function test.  Likely secondary to COVID infection.  Follow-up as outpatient.  DISCHARGE CONDITIONS:   Satisfactory  CONSULTS OBTAINED:  None  DRUG ALLERGIES:  No Known Allergies  DISCHARGE MEDICATIONS:   Allergies as of 05/31/2020   No Known Allergies     Medication List    STOP taking these medications   azelastine 0.1 % nasal spray Commonly known as: ASTELIN   benzonatate 200 MG capsule Commonly known as: TESSALON   lisinopril 10 MG tablet Commonly known as: ZESTRIL     TAKE these medications   acetaminophen 325 MG tablet Commonly known as: TYLENOL Take 2 tablets (650 mg total) by mouth every 6 (six) hours as needed for fever, headache or moderate pain.   albuterol 108 (90 Base) MCG/ACT inhaler Commonly known as: VENTOLIN HFA Inhale 2 puffs into the lungs every 4 (four) hours as needed for wheezing or shortness of breath.   ascorbic acid 500 MG tablet Commonly known as: VITAMIN C Take 1 tablet (500 mg total) by mouth daily.   aspirin EC 81 MG tablet Take by mouth.   ciprofloxacin 0.3 % ophthalmic solution Commonly known as: CILOXAN  Place 1 drop into the right eye every 4 (four) hours while awake. Administer 1 drop, every 2 hours, while awake, for 2 days. Then 1 drop, every 4 hours, while awake, for the next 5 days.   lovastatin 40 MG tablet Commonly known as: MEVACOR Take by mouth.   omeprazole 20 MG capsule Commonly known as: PRILOSEC Take 1 capsule by mouth daily.   predniSONE 20 MG tablet Commonly known as: DELTASONE Take 1 tablet (20 mg total) by mouth daily with breakfast. Start  taking on: June 01, 2020   traZODone 50 MG tablet Commonly known as: DESYREL Take 1 tablet (50 mg total) by mouth at bedtime as needed for sleep.   zinc sulfate 220 (50 Zn) MG capsule Take 1 capsule (220 mg total) by mouth daily.            Durable Medical Equipment  (From admission, onward)         Start     Ordered   05/30/20 1039  For home use only DME Bedside commode  Once       Question:  Patient needs a bedside commode to treat with the following condition  Answer:  Weakness   05/30/20 1039           DISCHARGE INSTRUCTIONS:   Follow-up PMD in a couple weeks  If you experience worsening of your admission symptoms, develop shortness of breath, life threatening emergency, suicidal or homicidal thoughts you must seek medical attention immediately by calling 911 or calling your MD immediately  if symptoms less severe.  You Must read complete instructions/literature along with all the possible adverse reactions/side effects for all the Medicines you take and that have been prescribed to you. Take any new Medicines after you have completely understood and accept all the possible adverse reactions/side effects.   Please note  You were cared for by a hospitalist during your hospital stay. If you have any questions about your discharge medications or the care you received while you were in the hospital after you are discharged, you can call the unit and asked to speak with the hospitalist on call if the hospitalist that took care of you is not available. Once you are discharged, your primary care physician will handle any further medical issues. Please note that NO REFILLS for any discharge medications will be authorized once you are discharged, as it is imperative that you return to your primary care physician (or establish a relationship with a primary care physician if you do not have one) for your aftercare needs so that they can reassess your need for medications and  monitor your lab values.    Today   CHIEF COMPLAINT:   Chief Complaint  Patient presents with  . Cough    HISTORY OF PRESENT ILLNESS:  [name]  is a 87 y.o. male came in with cough and found to have a borderline low oxygen saturation found to be COVID-positive   VITAL SIGNS:  Blood pressure 98/69, pulse 85, temperature 97.8 F (36.6 C), temperature source Oral, resp. rate 20, height 5\' 2"  (1.575 m), weight 70.3 kg, SpO2 90 %.  I/O:    Intake/Output Summary (Last 24 hours) at 05/31/2020 1617 Last data filed at 05/31/2020 1300 Gross per 24 hour  Intake --  Output 300 ml  Net -300 ml    PHYSICAL EXAMINATION:  GENERAL:  87 y.o.-year-old patient lying in the bed with no acute distress.  EYES: Pupils equal, round, reactive to light and accommodation.  No scleral icterus.  Slight drainage right eye HEENT: Head atraumatic, normocephalic. Oropharynx and nasopharynx clear.  LUNGS: Decreased breath sounds bilaterally, no wheezing, rales,rhonchi or crepitation. No use of accessory muscles of respiration.  CARDIOVASCULAR: S1, S2 normal. No murmurs, rubs, or gallops.  ABDOMEN: Soft, non-tender, non-distended. Bowel sounds present. No organomegaly or mass.  EXTREMITIES: No pedal edema.  NEUROLOGIC: Cranial nerves II through XII are intact. Muscle strength 5/5 in all extremities. Sensation intact. Gait not checked.  PSYCHIATRIC: The patient is alert and answers questions more appropriately.Marland Kitchen  SKIN: No obvious rash, lesion, or ulcer.   DATA REVIEW:   CBC Recent Labs  Lab 05/31/20 0527  WBC 6.3  HGB 13.7  HCT 40.9  PLT 279    Chemistries  Recent Labs  Lab 05/31/20 0527  NA 140  K 3.9  CL 106  CO2 23  GLUCOSE 78  BUN 24*  CREATININE 0.96  CALCIUM 8.5*  MG 2.2  AST 61*  ALT 47*  ALKPHOS 48  BILITOT 0.9     Microbiology Results  Results for orders placed or performed during the hospital encounter of 05/28/20  Culture, sputum-assessment     Status: None    Collection Time: 05/28/20  6:10 PM   Specimen: Sputum  Result Value Ref Range Status   Specimen Description SPU  Final   Special Requests NONE  Final   Sputum evaluation   Final    Sputum specimen not acceptable for testing.  Please recollect.   STACY CLAY AT 2059 ON 05/29/20 BY SS Performed at [facility], [address] [place], [place] [address]    Report Status 05/29/2020 FINAL  Final  Culture, blood (Routine X 2) w Reflex to ID Panel     Status: None (Preliminary result)   Collection Time: 05/28/20 11:44 PM   Specimen: BLOOD  Result Value Ref Range Status   Specimen Description BLOOD RIGHT ASSIST CONTROL  Final   Special Requests   Final    BOTTLES DRAWN AEROBIC AND ANAEROBIC Blood Culture results may not be optimal due to an excessive volume of blood received in culture bottles   Culture   Final    NO GROWTH 2 DAYS Performed at [facility], [address], [place], [place] [address]    Report Status PENDING  Incomplete  Culture, blood (Routine X 2) w Reflex to ID Panel     Status: None (Preliminary result)   Collection Time: 05/28/20 11:53 PM   Specimen: BLOOD  Result Value Ref Range Status   Specimen Description BLOOD LEFT ASSIST CONTROL  Final   Special Requests   Final    BOTTLES DRAWN AEROBIC AND ANAEROBIC Blood Culture results may not be optimal due to an excessive volume of blood received in culture bottles   Culture   Final    NO GROWTH 2 DAYS Performed at [facility], [address], [place], [place] [address]    Report Status PENDING  Incomplete  Urine Culture     Status: None   Collection Time: 05/30/20 12:05 AM   Specimen: Urine, Random  Result Value Ref Range Status   Specimen Description   Final    URINE, RANDOM Performed at [facility], [address] [place], [place] [address]    Special Requests   Final    NONE Performed at [facility], [address] [place], [place] [address]    Culture    Final    NO GROWTH Performed at  [facility], [place] [address] [place], [place] [address]    Report Status 05/31/2020 FINAL  Final    RADIOLOGY:  DG Chest Port 1 View  Result Date: 05/30/2020 CLINICAL DATA:  Cough. EXAM: PORTABLE CHEST 1 VIEW COMPARISON:  05/27/2020.  CT 12/01/2018. FINDINGS: Cardiomegaly. No pulmonary venous congestion. Low lung volumes with mild bibasilar atelectasis. Mild basilar infiltrates cannot be excluded. Nodular opacity noted over the right lung base, most likely a nipple shadow. Follow-up chest x-ray with nipple markers suggested. No pleural effusion or pneumothorax. Degenerative changes scoliosis thoracic spine. IMPRESSION: 1. Cardiomegaly. No pulmonary venous congestion. 2. Low lung volumes with mild bibasilar atelectasis. Mild basilar infiltrates cannot be excluded. 3. Nodular opacity noted over the right lung base, most likely a nipple shadow. Follow-up chest x-ray with nipple markers suggested. Electronically Signed   By: Marcello Moores  Register   On: 05/30/2020 05:51     Management plans discussed with the patient, family and they are in agreement.  CODE STATUS:     Code Status Orders  (From admission, onward)         Start     Ordered   05/28/20 2149  Full code  Continuous        05/28/20 2148        Code Status History    This patient has a current code status but no historical code status.   Advance Care Planning Activity      TOTAL TIME TAKING CARE OF THIS PATIENT: 35 minutes.    Loletha Grayer M.D on 05/31/2020 at 4:17 PM  Between 7am to 6pm - Pager - [phone]  After 6pm go to [email or web address] - password [email or web address]  [facility]  CC: Primary care physician; Tracie Harrier, MD

## 2020-05-31 NOTE — Care Management Important Message (Signed)
Important Message  Patient Details  Name: Thomas [name] MRN: [id number] Date of Birth: January 12, 1931   Medicare Important Message Given:  Yes  Reviewed with patient via room phone due to isolation status.  Reviewed with daughter Thomas [name], at [phone].  Copy of Medicare IM mailed to patient's home address.   Dannette Barbara 05/31/2020, 3:02 PM

## 2020-06-03 LAB — CULTURE, BLOOD (ROUTINE X 2)
Culture: NO GROWTH
Culture: NO GROWTH

## 2021-05-19 ENCOUNTER — Emergency Department: Payer: Medicare (Managed Care)

## 2021-05-19 ENCOUNTER — Inpatient Hospital Stay
Admission: EM | Admit: 2021-05-19 | Discharge: 2021-05-24 | DRG: 177 | Disposition: A | Discharge status: Home Health | Payer: Medicare (Managed Care) | Attending: Internal Medicine | Admitting: Internal Medicine

## 2021-05-19 ENCOUNTER — Encounter: Payer: Self-pay | Admitting: Pharmacy Technician

## 2021-05-19 DIAGNOSIS — Y92239 Unspecified place in hospital as the place of occurrence of the external cause: Secondary | ICD-10-CM | POA: Diagnosis not present

## 2021-05-19 DIAGNOSIS — G629 Polyneuropathy, unspecified: Secondary | ICD-10-CM | POA: Diagnosis present

## 2021-05-19 DIAGNOSIS — W19XXXA Unspecified fall, initial encounter: Secondary | ICD-10-CM | POA: Diagnosis present

## 2021-05-19 DIAGNOSIS — Z8249 Family history of ischemic heart disease and other diseases of the circulatory system: Secondary | ICD-10-CM

## 2021-05-19 DIAGNOSIS — I493 Ventricular premature depolarization: Secondary | ICD-10-CM | POA: Diagnosis present

## 2021-05-19 DIAGNOSIS — R296 Repeated falls: Secondary | ICD-10-CM | POA: Diagnosis present

## 2021-05-19 DIAGNOSIS — I10 Essential (primary) hypertension: Secondary | ICD-10-CM | POA: Diagnosis present

## 2021-05-19 DIAGNOSIS — Z801 Family history of malignant neoplasm of trachea, bronchus and lung: Secondary | ICD-10-CM

## 2021-05-19 DIAGNOSIS — Z7982 Long term (current) use of aspirin: Secondary | ICD-10-CM

## 2021-05-19 DIAGNOSIS — Z96653 Presence of artificial knee joint, bilateral: Secondary | ICD-10-CM | POA: Diagnosis present

## 2021-05-19 DIAGNOSIS — Z79899 Other long term (current) drug therapy: Secondary | ICD-10-CM | POA: Diagnosis not present

## 2021-05-19 DIAGNOSIS — Z20822 Contact with and (suspected) exposure to covid-19: Secondary | ICD-10-CM | POA: Diagnosis present

## 2021-05-19 DIAGNOSIS — Z7952 Long term (current) use of systemic steroids: Secondary | ICD-10-CM | POA: Diagnosis not present

## 2021-05-19 DIAGNOSIS — K219 Gastro-esophageal reflux disease without esophagitis: Secondary | ICD-10-CM | POA: Diagnosis present

## 2021-05-19 DIAGNOSIS — U071 COVID-19: Principal | ICD-10-CM | POA: Diagnosis present

## 2021-05-19 DIAGNOSIS — J9601 Acute respiratory failure with hypoxia: Secondary | ICD-10-CM | POA: Diagnosis present

## 2021-05-19 DIAGNOSIS — M6282 Rhabdomyolysis: Secondary | ICD-10-CM | POA: Diagnosis present

## 2021-05-19 DIAGNOSIS — T380X5A Adverse effect of glucocorticoids and synthetic analogues, initial encounter: Secondary | ICD-10-CM | POA: Diagnosis not present

## 2021-05-19 DIAGNOSIS — M1990 Unspecified osteoarthritis, unspecified site: Secondary | ICD-10-CM | POA: Diagnosis present

## 2021-05-19 DIAGNOSIS — Z800 Family history of malignant neoplasm of digestive organs: Secondary | ICD-10-CM | POA: Diagnosis not present

## 2021-05-19 DIAGNOSIS — R4182 Altered mental status, unspecified: Secondary | ICD-10-CM | POA: Diagnosis present

## 2021-05-19 DIAGNOSIS — G9341 Metabolic encephalopathy: Secondary | ICD-10-CM | POA: Diagnosis present

## 2021-05-19 DIAGNOSIS — E785 Hyperlipidemia, unspecified: Secondary | ICD-10-CM | POA: Diagnosis present

## 2021-05-19 DIAGNOSIS — T796XXA Traumatic ischemia of muscle, initial encounter: Secondary | ICD-10-CM | POA: Diagnosis present

## 2021-05-19 DIAGNOSIS — Z808 Family history of malignant neoplasm of other organs or systems: Secondary | ICD-10-CM

## 2021-05-19 DIAGNOSIS — T796XXS Traumatic ischemia of muscle, sequela: Secondary | ICD-10-CM | POA: Diagnosis not present

## 2021-05-19 DIAGNOSIS — R531 Weakness: Secondary | ICD-10-CM

## 2021-05-19 DIAGNOSIS — G9340 Encephalopathy, unspecified: Secondary | ICD-10-CM

## 2021-05-19 LAB — COMPREHENSIVE METABOLIC PANEL
ALT: 25 U/L (ref 0–44)
AST: 77 U/L — ABNORMAL HIGH (ref 15–41)
Albumin: 3.6 g/dL (ref 3.5–5.0)
Alkaline Phosphatase: 62 U/L (ref 38–126)
Anion gap: 7 (ref 5–15)
BUN: 17 mg/dL (ref 8–23)
CO2: 26 mmol/L (ref 22–32)
Calcium: 8.9 mg/dL (ref 8.9–10.3)
Chloride: 104 mmol/L (ref 98–111)
Creatinine, Ser: 1.04 mg/dL (ref 0.61–1.24)
GFR, Estimated: >60 mL/min (ref >60)
Glucose, Bld: 94 mg/dL (ref 70–99)
Potassium: 3.7 mmol/L (ref 3.5–5.1)
Sodium: 137 mmol/L (ref 135–145)
Total Bilirubin: 1.1 mg/dL (ref 0.3–1.2)
Total Protein: 6.7 g/dL (ref 6.5–8.1)

## 2021-05-19 LAB — CBC WITH DIFFERENTIAL/PLATELET
Abs Immature Granulocytes: 0.02 10*3/uL (ref 0.00–0.07)
Basophils Absolute: 0 10*3/uL (ref 0.0–0.1)
Basophils Relative: 0 %
Eosinophils Absolute: 0 10*3/uL (ref 0.0–0.5)
Eosinophils Relative: 1 %
HCT: 40.3 % (ref 39.0–52.0)
Hemoglobin: 13.4 g/dL (ref 13.0–17.0)
Immature Granulocytes: 0 %
Lymphocytes Relative: 18 %
Lymphs Abs: 1.2 10*3/uL (ref 0.7–4.0)
MCH: 30.2 pg (ref 26.0–34.0)
MCHC: 33.3 g/dL (ref 30.0–36.0)
MCV: 90.8 fL (ref 80.0–100.0)
Monocytes Absolute: 0.8 10*3/uL (ref 0.1–1.0)
Monocytes Relative: 11 %
Neutro Abs: 4.7 10*3/uL (ref 1.7–7.7)
Neutrophils Relative %: 70 %
Platelets: 199 10*3/uL (ref 150–400)
RBC: 4.44 MIL/uL (ref 4.22–5.81)
RDW: 13.7 % (ref 11.5–15.5)
WBC: 6.7 10*3/uL (ref 4.0–10.5)
nRBC: 0 % (ref 0.0–0.2)

## 2021-05-19 LAB — BRAIN NATRIURETIC PEPTIDE: B Natriuretic Peptide: 186.4 pg/mL — ABNORMAL HIGH (ref 0.0–100.0)

## 2021-05-19 LAB — RESP PANEL BY RT-PCR (FLU A&B, COVID) ARPGX2
Influenza A by PCR: NEGATIVE
Influenza B by PCR: NEGATIVE
SARS Coronavirus 2 by RT PCR: POSITIVE — AB

## 2021-05-19 LAB — TROPONIN I (HIGH SENSITIVITY)
Troponin I (High Sensitivity): 56 ng/L — ABNORMAL HIGH (ref <18)
Troponin I (High Sensitivity): 48 ng/L — ABNORMAL HIGH (ref <18)

## 2021-05-19 LAB — CK: Total CK: 3544 U/L — ABNORMAL HIGH (ref 49–397)

## 2021-05-19 LAB — LIPASE, BLOOD: Lipase: 22 U/L (ref 11–51)

## 2021-05-19 LAB — TSH: TSH: 1.758 u[IU]/mL (ref 0.350–4.500)

## 2021-05-19 LAB — MAGNESIUM: Magnesium: 2 mg/dL (ref 1.7–2.4)

## 2021-05-19 LAB — T4, FREE: Free T4: 0.74 ng/dL (ref 0.61–1.12)

## 2021-05-19 MED ORDER — ASPIRIN EC 81 MG PO TBEC
81.0000 mg | DELAYED_RELEASE_TABLET | Freq: Every day | ORAL | Status: DC
  Administered 2021-05-20 – 2021-05-24 (×5): 81 mg via ORAL
  Filled 2021-05-19 (×5): qty 1

## 2021-05-19 MED ORDER — ZINC SULFATE 220 (50 ZN) MG PO CAPS
220.0000 mg | ORAL_CAPSULE | Freq: Every day | ORAL | Status: DC
  Administered 2021-05-20 – 2021-05-24 (×5): 220 mg via ORAL
  Filled 2021-05-19 (×5): qty 1

## 2021-05-19 MED ORDER — ASCORBIC ACID 500 MG PO TABS
500.0000 mg | ORAL_TABLET | Freq: Every day | ORAL | Status: DC
  Administered 2021-05-20 – 2021-05-24 (×5): 500 mg via ORAL
  Filled 2021-05-19 (×5): qty 1

## 2021-05-19 MED ORDER — ALBUTEROL SULFATE HFA 108 (90 BASE) MCG/ACT IN AERS
2.0000 | INHALATION_SPRAY | RESPIRATORY_TRACT | Status: DC | PRN
  Filled 2021-05-19: qty 6.7

## 2021-05-19 MED ORDER — CIPROFLOXACIN HCL 0.3 % OP SOLN: 1.0000 [drp] | OPHTHALMIC | Status: DC

## 2021-05-19 MED ORDER — SODIUM CHLORIDE 0.9 % IV SOLN
100.0000 mg | Freq: Every day | INTRAVENOUS | Status: AC
  Administered 2021-05-21 – 2021-05-24 (×4): 100 mg via INTRAVENOUS
  Filled 2021-05-19 (×3): qty 100
  Filled 2021-05-19: qty 20

## 2021-05-19 MED ORDER — SODIUM CHLORIDE 0.9 % IV SOLN
200.0000 mg | Freq: Once | INTRAVENOUS | Status: AC
  Administered 2021-05-20: 200 mg via INTRAVENOUS
  Filled 2021-05-19: qty 40

## 2021-05-19 MED ORDER — HEPARIN SODIUM (PORCINE) 5000 UNIT/ML IJ SOLN
5000.0000 [IU] | Freq: Three times a day (TID) | INTRAMUSCULAR | Status: DC
  Administered 2021-05-20 – 2021-05-24 (×13): 5000 [IU] via SUBCUTANEOUS
  Filled 2021-05-19 (×13): qty 1

## 2021-05-19 MED ORDER — SODIUM CHLORIDE 0.9% FLUSH: 3.0000 mL | Freq: Two times a day (BID) | INTRAVENOUS | Status: DC

## 2021-05-19 MED ORDER — SODIUM CHLORIDE 0.9 % IV SOLN: 250.0000 mL | INTRAVENOUS | Status: DC | PRN

## 2021-05-19 MED ORDER — HYDROCOD POLST-CPM POLST ER 10-8 MG/5ML PO SUER: 5.0000 mL | Freq: Two times a day (BID) | ORAL | Status: DC | PRN

## 2021-05-19 MED ORDER — PREDNISONE 20 MG PO TABS: 50.0000 mg | ORAL_TABLET | Freq: Every day | ORAL | Status: DC

## 2021-05-19 MED ORDER — DOCUSATE SODIUM 100 MG PO CAPS
100.0000 mg | ORAL_CAPSULE | Freq: Two times a day (BID) | ORAL | Status: DC
  Administered 2021-05-20 – 2021-05-23 (×7): 100 mg via ORAL
  Filled 2021-05-19 (×7): qty 1

## 2021-05-19 MED ORDER — ALBUTEROL SULFATE HFA 108 (90 BASE) MCG/ACT IN AERS: 2.0000 | INHALATION_SPRAY | RESPIRATORY_TRACT | Status: DC | PRN

## 2021-05-19 MED ORDER — GUAIFENESIN-DM 100-10 MG/5ML PO SYRP: 10.0000 mL | ORAL_SOLUTION | ORAL | Status: DC | PRN

## 2021-05-19 MED ORDER — SODIUM CHLORIDE 0.9% FLUSH: 3.0000 mL | INTRAVENOUS | Status: DC | PRN

## 2021-05-19 MED ORDER — SODIUM CHLORIDE 0.9 % IV SOLN: INTRAVENOUS | Status: DC

## 2021-05-19 MED ORDER — ACETAMINOPHEN 325 MG PO TABS: 650.0000 mg | ORAL_TABLET | Freq: Four times a day (QID) | ORAL | Status: DC | PRN

## 2021-05-19 MED ORDER — SODIUM CHLORIDE 0.9% FLUSH
3.0000 mL | Freq: Two times a day (BID) | INTRAVENOUS | Status: DC
  Administered 2021-05-20 – 2021-05-24 (×10): 3 mL via INTRAVENOUS

## 2021-05-19 MED ORDER — ZINC SULFATE 220 (50 ZN) MG PO CAPS: 220.0000 mg | ORAL_CAPSULE | Freq: Every day | ORAL | Status: DC

## 2021-05-19 MED ORDER — TRAZODONE HCL 50 MG PO TABS
50.0000 mg | ORAL_TABLET | Freq: Every evening | ORAL | Status: DC | PRN
  Administered 2021-05-20 – 2021-05-22 (×2): 50 mg via ORAL
  Filled 2021-05-19 (×3): qty 1

## 2021-05-19 MED ORDER — ASCORBIC ACID 500 MG PO TABS: 500.0000 mg | ORAL_TABLET | Freq: Every day | ORAL | Status: DC

## 2021-05-19 MED ORDER — METHYLPREDNISOLONE SODIUM SUCC 500 MG IJ SOLR: 0.5000 mg/kg | Freq: Two times a day (BID) | INTRAMUSCULAR | Status: DC

## 2021-05-19 NOTE — ED Triage Notes (Signed)
Pt bib family with reports of falling yesterday, altered mental status onset today per family.Family states pt was alert and oriented X4. Pt found down on the floor today approx 0800 today by family. Pt unable to recall events from yesterday. Family reports he has been weak today. In triage pt pulse drops down to the upper 30's. Pt alert, denies any complaints.

## 2021-05-19 NOTE — ED Provider Triage Note (Signed)
Emergency Medicine Provider Triage Evaluation Note  Thomas [name] , a 88 y.o. male  was evaluated in triage.  Pt family states that he is confused today and weak. Typically is able to drive and take care of himself. Family state that he fell this morning and has not been himself all day.  Review of Systems  Positive: Confused   Physical Exam  There were no vitals taken for this visit. Gen:   Awake, no distress   Resp:  Normal effort  MSK:   Moves extremities without difficulty  Other:    Medical Decision Making  Medically screening exam initiated at 6:53 PM.  Appropriate orders placed.  Thomas [name] was informed that the remainder of the evaluation will be completed by another provider, this initial triage assessment does not replace that evaluation, and the importance of remaining in the ED until their evaluation is complete.    [name], FNP 05/22/21 1103

## 2021-05-19 NOTE — ED Provider Notes (Signed)
[facility] Provider Note    Event Date/Time   First MD Initiated Contact with Patient 05/19/21 2014     (approximate)   History   Altered Mental Status, Fall, and Weakness   HPI  Thomas [name] is a 88 y.o. male  here with weakness. Pt reports that over the past several days, he has had progressively worsening weakness. He has fallen several times. Denies any pain from the falls. Family reports pt has also been more confused, weak. He has not been oriented today. He has asked repetitive questioning. He has also had a wet sounding, productive cough and had c/o SOB. Denies any pain on my assessment. He does not recall why he is here. Of note, pt was admitted approx 1 ya with COVID with similar sx. No known focal deficits.       Physical Exam   Triage Vital Signs: ED Triage Vitals [05/19/21 1855]  Enc Vitals Group     BP 121/87     Pulse Rate (!) 44     Resp 14     Temp 99.1 F (37.3 C)     Temp src      SpO2 94 %     Weight      Height      Head Circumference      Peak Flow      Pain Score 0     Pain Loc      Pain Edu?      Excl. in [place]?     Most recent vital signs: Vitals:   05/19/21 2018 05/19/21 2215  BP: 121/71 132/79  Pulse:  67  Resp: (!) 25 18  Temp:    SpO2:       General: Awake, no distress.  CV:  Good peripheral perfusion. Normal rate. No murmurs. Resp:  Normal effort. Slight basilar rales but normal WOB. Abd:  No distention. No tenderness. Other:  Disoriented but CN intact, strength and sensation intact bl. Gait deferred due to weakness.   ED Results / Procedures / Treatments   Labs (all labs ordered are listed, but only abnormal results are displayed) Labs Reviewed  RESP PANEL BY RT-PCR (FLU A&B, COVID) ARPGX2 - Abnormal; Notable for the following components:      Result Value   SARS Coronavirus 2 by RT PCR POSITIVE (*)    All other components within normal limits  COMPREHENSIVE METABOLIC PANEL - Abnormal; Notable  for the following components:   AST 77 (*)    All other components within normal limits  BRAIN NATRIURETIC PEPTIDE - Abnormal; Notable for the following components:   B Natriuretic Peptide 186.4 (*)    All other components within normal limits  CK - Abnormal; Notable for the following components:   Total CK 3,544 (*)    All other components within normal limits  TROPONIN I (HIGH SENSITIVITY) - Abnormal; Notable for the following components:   Troponin I (High Sensitivity) 56 (*)    All other components within normal limits  TROPONIN I (HIGH SENSITIVITY) - Abnormal; Notable for the following components:   Troponin I (High Sensitivity) 48 (*)    All other components within normal limits  CBC WITH DIFFERENTIAL/PLATELET  LIPASE, BLOOD  TSH  T4, FREE  MAGNESIUM  URINALYSIS, ROUTINE W REFLEX MICROSCOPIC  C-REACTIVE PROTEIN  D-DIMER, QUANTITATIVE  FERRITIN  FIBRINOGEN  LACTATE DEHYDROGENASE  PROCALCITONIN  CBC WITH DIFFERENTIAL/PLATELET  COMPREHENSIVE METABOLIC PANEL  C-REACTIVE PROTEIN  D-DIMER, QUANTITATIVE  FERRITIN  MAGNESIUM  PHOSPHORUS  CBC  CREATININE, SERUM     EKG  Sinus rhythm with frequent PVCs.  Ventricular rate 82, PR 206, QRS 106, QTc 474.  Nonspecific ST changes.  No acute ST elevations.   RADIOLOGY CT head/C-spine: Reviewed by me, no CT evidence of acute abnormality Chest x-ray: No active disease    PROCEDURES:  Critical Care performed: No  .1-3 Lead EKG Interpretation Performed by: Duffy Bruce, MD Authorized by: Duffy Bruce, MD     Interpretation: normal     ECG rate:  60-80   ECG rate assessment: normal     Rhythm: sinus rhythm     Ectopy: PAC and PVCs     Conduction: normal   Comments:     Indication: weakness   MEDICATIONS ORDERED IN ED: Medications  0.9 %  sodium chloride infusion ( Intravenous New Bag/Given 05/19/21 2210)  aspirin EC tablet 81 mg (has no administration in time range)  ascorbic acid (VITAMIN C) tablet 500 mg  (has no administration in time range)  zinc sulfate capsule 220 mg (has no administration in time range)  ciprofloxacin (CILOXAN) 0.3 % ophthalmic solution 1 drop (has no administration in time range)  traZODone (DESYREL) tablet 50 mg (has no administration in time range)  sodium chloride flush (NS) 0.9 % injection 3 mL (has no administration in time range)  sodium chloride flush (NS) 0.9 % injection 3 mL (has no administration in time range)  0.9 %  sodium chloride infusion (has no administration in time range)  albuterol (VENTOLIN HFA) 108 (90 Base) MCG/ACT inhaler 2 puff (has no administration in time range)  methylPREDNISolone sodium succinate (SOLU-MEDROL) injection 0.5 mg/kg (has no administration in time range)    Followed by  predniSONE (DELTASONE) tablet 50 mg (has no administration in time range)  guaiFENesin-dextromethorphan (ROBITUSSIN DM) 100-10 MG/5ML syrup 10 mL (has no administration in time range)  chlorpheniramine-HYDROcodone (TUSSIONEX) 10-8 MG/5ML suspension 5 mL (has no administration in time range)  ascorbic acid (VITAMIN C) tablet 500 mg (has no administration in time range)  zinc sulfate capsule 220 mg (has no administration in time range)  acetaminophen (TYLENOL) tablet 650 mg (has no administration in time range)  docusate sodium (COLACE) capsule 100 mg (has no administration in time range)  heparin injection 5,000 Units (has no administration in time range)  remdesivir 200 mg in sodium chloride 0.9% 250 mL IVPB (has no administration in time range)    Followed by  remdesivir 100 mg in sodium chloride 0.9 % 100 mL IVPB (has no administration in time range)     IMPRESSION / MDM / ASSESSMENT AND PLAN / ED COURSE  I reviewed the triage vital signs and the nursing notes.                               The patient is on the cardiac monitor to evaluate for evidence of arrhythmia and/or significant heart rate changes.   Ddx: COVID-related encephalopathy, hypoxia,  metabolic encephalopathy, stroke, polypharmacy  88 year old male here with generalized weakness, fall, and confusion in the setting of COVID-19.  Patient has a history of similar admission in January, which I reviewed.  Patient is COVID-positive.  CBC without significant leukocytosis or anemia.  CMP unremarkable.  Troponin is at baseline, denies any chest pain and EKG is nonischemic.  Chest x-ray reviewed, shows no evidence of pneumonia.  CT head and cervical spine obtained due to his fall,  reviewed by me and are negative.  Patient does have mild rhabdomyolysis, though with normal renal function.  Suspect this is due to his fall and downtime.  Will start on fluids, admit to medicine for ongoing encephalopathy.  MEDICATIONS GIVEN IN ED: Medications  0.9 %  sodium chloride infusion ( Intravenous New Bag/Given 05/19/21 2210)  aspirin EC tablet 81 mg (has no administration in time range)  ascorbic acid (VITAMIN C) tablet 500 mg (has no administration in time range)  zinc sulfate capsule 220 mg (has no administration in time range)  ciprofloxacin (CILOXAN) 0.3 % ophthalmic solution 1 drop (has no administration in time range)  traZODone (DESYREL) tablet 50 mg (has no administration in time range)  sodium chloride flush (NS) 0.9 % injection 3 mL (has no administration in time range)  sodium chloride flush (NS) 0.9 % injection 3 mL (has no administration in time range)  0.9 %  sodium chloride infusion (has no administration in time range)  albuterol (VENTOLIN HFA) 108 (90 Base) MCG/ACT inhaler 2 puff (has no administration in time range)  methylPREDNISolone sodium succinate (SOLU-MEDROL) injection 0.5 mg/kg (has no administration in time range)    Followed by  predniSONE (DELTASONE) tablet 50 mg (has no administration in time range)  guaiFENesin-dextromethorphan (ROBITUSSIN DM) 100-10 MG/5ML syrup 10 mL (has no administration in time range)  chlorpheniramine-HYDROcodone (TUSSIONEX) 10-8 MG/5ML suspension  5 mL (has no administration in time range)  ascorbic acid (VITAMIN C) tablet 500 mg (has no administration in time range)  zinc sulfate capsule 220 mg (has no administration in time range)  acetaminophen (TYLENOL) tablet 650 mg (has no administration in time range)  docusate sodium (COLACE) capsule 100 mg (has no administration in time range)  heparin injection 5,000 Units (has no administration in time range)  remdesivir 200 mg in sodium chloride 0.9% 250 mL IVPB (has no administration in time range)    Followed by  remdesivir 100 mg in sodium chloride 0.9 % 100 mL IVPB (has no administration in time range)       Consults: Case discussed with Dr. Posey Pronto of hospitalist, will admit.   EMR reviewed reviewed prior admission from January 2022 with similar presentation.     FINAL CLINICAL IMPRESSION(S) / ED DIAGNOSES   Final diagnoses:  COVID-19  Encephalopathy  Traumatic rhabdomyolysis, initial encounter [facility])     Rx / DC Orders   ED Discharge Orders     None        Note:  This document was prepared using Dragon voice recognition software and may include unintentional dictation errors.   Duffy Bruce, MD 05/19/21 [phone]

## 2021-05-19 NOTE — H&P (Signed)
History and Physical    Thomas [name] JME:[id number] DOB: Nov 03, 1930 DOA: 05/19/2021  PCP: Tracie Harrier, MD    Patient coming from:  Home    Chief Complaint:  SOB    HPI:  Thomas [name] is a 88 y.o. male seen in ed with complaints of confusion and generalized illness presents to ed fall and being found on the floor by daughter. Pt in ed was found to have COVID and confusion. Pt had similar episode about a year ago.   Pt has past medical history of covid-19, multiple nodules, hyperlipidemia, acute metabolic encephalopathy, acute respiratory failure with hypoxia. ED Course:   Vitals:   05/19/21 2018 05/19/21 2215 05/19/21 2330 05/20/21 0000  BP: 121/71 132/79 133/80 130/79  Pulse:  67  69  Resp: (!) 25 18 (!) 24 20  Temp:    98.9 F (37.2 C)  TempSrc:    Oral  SpO2:    98%   In the emergency room patient is alert awake oriented and has pain generalized ache.    Review of Systems:  Review of Systems  Respiratory:  Positive for cough. Negative for shortness of breath.   Cardiovascular:  Negative for chest pain.  All other systems reviewed and are negative.    Past Medical History:  Diagnosis Date   Arthritis    GERD (gastroesophageal reflux disease)    Hypertension     Past Surgical History:  Procedure Laterality Date   INGUINAL HERNIA REPAIR  2001   REPLACEMENT TOTAL KNEE Bilateral      reports that he has never smoked. He has never used smokeless tobacco. He reports that he does not drink alcohol and does not use drugs.  No Known Allergies  Family History  Problem Relation Age of Onset   Cancer Sister        Liver cancer   Melanoma Sister    Cancer Brother        Pancreatic   Cancer Brother        Lung cancer   Heart disease Brother     Prior to Admission medications   Medication Sig Start Date End Date Taking? Authorizing Provider  acetaminophen (TYLENOL) 325 MG tablet Take 2 tablets (650 mg total) by mouth every 6 (six) hours as needed for  fever, headache or moderate pain. 05/31/20   Loletha Grayer, MD  albuterol (VENTOLIN HFA) 108 (90 Base) MCG/ACT inhaler Inhale 2 puffs into the lungs every 4 (four) hours as needed for wheezing or shortness of breath. 05/31/20   Loletha Grayer, MD  ascorbic acid (VITAMIN C) 500 MG tablet Take 1 tablet (500 mg total) by mouth daily. 05/31/20   Loletha Grayer, MD  aspirin EC 81 MG tablet Take by mouth.    [provider]  ciprofloxacin (CILOXAN) 0.3 % ophthalmic solution Place 1 drop into the right eye every 4 (four) hours while awake. Administer 1 drop, every 2 hours, while awake, for 2 days. Then 1 drop, every 4 hours, while awake, for the next 5 days. 05/31/20   Loletha Grayer, MD  lovastatin (MEVACOR) 40 MG tablet Take by mouth. 03/14/12   [provider]  omeprazole (PRILOSEC) 20 MG capsule Take 1 capsule by mouth daily. 10/24/18   [provider]  predniSONE (DELTASONE) 20 MG tablet Take 1 tablet (20 mg total) by mouth daily with breakfast. 06/01/20   Loletha Grayer, MD  traZODone (DESYREL) 50 MG tablet Take 1 tablet (50 mg total) by mouth at bedtime as  needed for sleep. 05/31/20   Loletha Grayer, MD  zinc sulfate 220 (50 Zn) MG capsule Take 1 capsule (220 mg total) by mouth daily. 05/31/20   Loletha Grayer, MD    Physical Exam: Vitals:   05/19/21 2018 05/19/21 2215 05/19/21 2330 05/20/21 0000  BP: 121/71 132/79 133/80 130/79  Pulse:  67  69  Resp: (!) 25 18 (!) 24 20  Temp:    98.9 F (37.2 C)  TempSrc:    Oral  SpO2:    98%   Physical Exam Vitals reviewed.  Constitutional:      General: He is not in acute distress.    Appearance: He is not ill-appearing.  HENT:     Head: Normocephalic and atraumatic.     Right Ear: External ear normal.     Left Ear: External ear normal.     Nose: Nose normal.     Mouth/Throat:     Mouth: Mucous membranes are dry.  Eyes:     Conjunctiva/sclera: Conjunctivae normal.  Cardiovascular:     Rate and Rhythm: Normal  rate and regular rhythm.     Pulses: Normal pulses.     Heart sounds: Normal heart sounds.  Pulmonary:     Effort: Pulmonary effort is normal. No respiratory distress.     Breath sounds: Normal breath sounds. No stridor. No wheezing, rhonchi or rales.  Abdominal:     General: Bowel sounds are normal. There is no distension.     Palpations: Abdomen is soft. There is no mass.     Tenderness: There is no abdominal tenderness. There is no guarding.     Hernia: No hernia is present.  Musculoskeletal:     Right lower leg: No edema.     Left lower leg: No edema.  Skin:    General: Skin is warm.  Neurological:     General: No focal deficit present.     Mental Status: He is alert and oriented to person, place, and time.  Psychiatric:        Mood and Affect: Mood normal.        Behavior: Behavior normal.     Labs on Admission: I have personally reviewed following labs and imaging studies Recent Labs    05/19/21 1859  CKTOTAL 3,544*   Lab Results  Component Value Date   WBC 6.5 05/20/2021   HGB 13.4 05/20/2021   HCT 40.5 05/20/2021   MCV 91.4 05/20/2021   PLT 187 05/20/2021    Recent Labs  Lab 05/19/21 1859 05/20/21 0000  NA 137  --   K 3.7  --   CL 104  --   CO2 26  --   BUN 17  --   CREATININE 1.04 1.09  CALCIUM 8.9  --   PROT 6.7  --   BILITOT 1.1  --   ALKPHOS 62  --   ALT 25  --   AST 77*  --   GLUCOSE 94  --    Lab Results  Component Value Date   TRIG 51 05/28/2020   Lab Results  Component Value Date   DDIMER 1.66 (H) 05/20/2021   Invalid input(s): POCBNP   COVID-19 Labs Recent Labs    05/20/21 0000  DDIMER 1.66*  FERRITIN 263  LDH 203*   Lab Results  Component Value Date   SARSCOV2NAA POSITIVE (A) 05/19/2021    Radiological Exams on Admission: DG Chest 2 View  Result Date: 05/19/2021 CLINICAL DATA:  Shortness of breath. EXAM:  CHEST - 2 VIEW COMPARISON:  Chest radiograph dated 05/30/2020. FINDINGS: Left lung base atelectasis/scarring. No  focal consolidation, pleural effusion or pneumothorax. Mild cardiomegaly. Atherosclerotic calcification of the aorta. No acute osseous pathology. IMPRESSION: No active cardiopulmonary disease. Electronically Signed   By: Anner Crete M.D.   On: 05/19/2021 21:10   CT Head Wo Contrast  Result Date: 05/19/2021 CLINICAL DATA:  Fall with altered mental status EXAM: CT HEAD WITHOUT CONTRAST CT CERVICAL SPINE WITHOUT CONTRAST TECHNIQUE: Multidetector CT imaging of the head and cervical spine was performed following the standard protocol without intravenous contrast. Multiplanar CT image reconstructions of the cervical spine were also generated. COMPARISON:  CT neck 01/21/2018 FINDINGS: CT HEAD FINDINGS Brain: No acute territorial infarction, hemorrhage or intracranial mass. Advanced atrophy. Extensive white matter hypodensity consistent with chronic small vessel ischemic change. Chronic appearing lacunar infarcts in the basal ganglia and thalamus. Small chronic cerebellar infarcts. Prominent ventricles felt secondary to atrophy Vascular: No hyperdense vessels.  Carotid vascular calcification Skull: Normal. Negative for fracture or focal lesion. Sinuses/Orbits: Mucosal thickening in the sinuses Other: None CT CERVICAL SPINE FINDINGS Alignment: Mild cervical kyphosis. Trace anterolisthesis C3 on C4 fall and C7 on T1. Trace retrolisthesis C5 on C6. Facet alignment within normal limits. Skull base and vertebrae: No acute fracture. No primary bone lesion or focal pathologic process. Soft tissues and spinal canal: No prevertebral fluid or swelling. No visible canal hematoma. Disc levels: Multilevel degenerative change with advanced disc space narrowing and degenerative change C4 through C7. Facet degenerative changes at multiple levels with foraminal stenosis. Upper chest: Lung apices are clear. Solid mass in the right neck measuring 3.6 by 3.3 cm, previously 3.5 x 2.8 x 3.1 cm, history of biopsy demonstrating schwannoma.  Other: None IMPRESSION: 1. No CT evidence for acute intracranial abnormality. Atrophy and chronic small vessel ischemic changes of the white matter. Multiple small chronic appearing infarcts 2. Cervical kyphosis with multilevel degenerative changes. No fracture is seen 3. 3.6 cm solid right supraclavicular mass, this has been previously biopsied. Electronically Signed   By: Donavan Foil M.D.   On: 05/19/2021 20:44   CT Cervical Spine Wo Contrast  Result Date: 05/19/2021 CLINICAL DATA:  Fall with altered mental status EXAM: CT HEAD WITHOUT CONTRAST CT CERVICAL SPINE WITHOUT CONTRAST TECHNIQUE: Multidetector CT imaging of the head and cervical spine was performed following the standard protocol without intravenous contrast. Multiplanar CT image reconstructions of the cervical spine were also generated. COMPARISON:  CT neck 01/21/2018 FINDINGS: CT HEAD FINDINGS Brain: No acute territorial infarction, hemorrhage or intracranial mass. Advanced atrophy. Extensive white matter hypodensity consistent with chronic small vessel ischemic change. Chronic appearing lacunar infarcts in the basal ganglia and thalamus. Small chronic cerebellar infarcts. Prominent ventricles felt secondary to atrophy Vascular: No hyperdense vessels.  Carotid vascular calcification Skull: Normal. Negative for fracture or focal lesion. Sinuses/Orbits: Mucosal thickening in the sinuses Other: None CT CERVICAL SPINE FINDINGS Alignment: Mild cervical kyphosis. Trace anterolisthesis C3 on C4 fall and C7 on T1. Trace retrolisthesis C5 on C6. Facet alignment within normal limits. Skull base and vertebrae: No acute fracture. No primary bone lesion or focal pathologic process. Soft tissues and spinal canal: No prevertebral fluid or swelling. No visible canal hematoma. Disc levels: Multilevel degenerative change with advanced disc space narrowing and degenerative change C4 through C7. Facet degenerative changes at multiple levels with foraminal stenosis.  Upper chest: Lung apices are clear. Solid mass in the right neck measuring 3.6 by 3.3 cm, previously 3.5 x 2.8  x 3.1 cm, history of biopsy demonstrating schwannoma. Other: None IMPRESSION: 1. No CT evidence for acute intracranial abnormality. Atrophy and chronic small vessel ischemic changes of the white matter. Multiple small chronic appearing infarcts 2. Cervical kyphosis with multilevel degenerative changes. No fracture is seen 3. 3.6 cm solid right supraclavicular mass, this has been previously biopsied. Electronically Signed   By: Donavan Foil M.D.   On: 05/19/2021 20:44    EKG: Independently reviewed.  SR 82, Pac and IRBBB.   Assessment/Plan: Principal Problem:   AMS (altered mental status) Active Problems:   COVID-19 virus infection   Rhabdomyolysis due to COVID-19   Hypertension   GERD (gastroesophageal reflux disease)   Altered mental status: Attribute patient's altered mental status to his acute COVID infection. Neurochecks, aspiration precaution.  COVID-19 infection: Start patient on remdesivir regimen cardiac care and supplemental oxygen.  Rhabdo secondary to COVID-19: Rhabdo chemotherapy with resultant fall that the patient had. We will continue with gentle IV fluid hydration. As needed pain medication.  Blood pressure 130/79, pulse 69, temperature 98.9 F (37.2 C), temperature source Oral, resp. rate 20, SpO2 98 %. For continuation monitor blood pressure.   GERD: PPI therapy.   DVT prophylaxis:  Heparin.   Code Status:  Full code    Family Communication:  Garfield Cornea (Daughter)  [phone] (Mobile)   Disposition Plan:  Home    Consults called:   None   Admission status: Inpatient.     Medical Decision Making Problems Addressed: COVID-19: acute illness or injury Encephalopathy: acute illness or injury Traumatic rhabdomyolysis, initial encounter [facility]): acute illness or injury  Amount and/or Complexity of Data Reviewed External Data  Reviewed: labs, radiology and notes.  Critical Care Total time providing critical care: < 30 minutes   MDM Reviewed: previous chart, nursing note and vitals Reviewed previous: labs and ECG Interpretation: labs, ECG and x-ray Total time providing critical care: < 30 minutes. This excludes time spent performing separately reportable procedures and services.     Para Skeans MD [facility]  6 PM- 2 AM. Please contact me via secure Chat 6 PM-2 AM. To contact the [facility] Attending or Consulting provider [place] or covering provider during after hours [place], for this patient.   . The care team in [facility] and look for a) attending/consulting [place] provider listed and b) the [facility] team listed Log into [email or web address] and use [facility]'s universal password to access. If you do not have the password, please contact the hospital operator. Locate the [facility] provider you are looking for under [facility] and page to a number that you can be directly reached. If you still have difficulty reaching the provider, please page the [facility] (Director on Call) for the Hospitalists listed on amion for assistance. [email or web address] 05/20/2021, 1:18 AM

## 2021-05-19 NOTE — ED Notes (Signed)
ED Provider at bedside. 

## 2021-05-20 DIAGNOSIS — G9341 Metabolic encephalopathy: Secondary | ICD-10-CM

## 2021-05-20 DIAGNOSIS — T796XXS Traumatic ischemia of muscle, sequela: Secondary | ICD-10-CM

## 2021-05-20 DIAGNOSIS — E785 Hyperlipidemia, unspecified: Secondary | ICD-10-CM

## 2021-05-20 DIAGNOSIS — G629 Polyneuropathy, unspecified: Secondary | ICD-10-CM

## 2021-05-20 DIAGNOSIS — T796XXA Traumatic ischemia of muscle, initial encounter: Secondary | ICD-10-CM | POA: Insufficient documentation

## 2021-05-20 DIAGNOSIS — K219 Gastro-esophageal reflux disease without esophagitis: Secondary | ICD-10-CM

## 2021-05-20 LAB — D-DIMER, QUANTITATIVE
D-Dimer, Quant: 1.66 ug/mL-FEU — ABNORMAL HIGH (ref 0.00–0.50)
D-Dimer, Quant: 1.66 ug/mL-FEU — ABNORMAL HIGH (ref 0.00–0.50)

## 2021-05-20 LAB — CBC WITH DIFFERENTIAL/PLATELET
Abs Immature Granulocytes: 0.01 10*3/uL (ref 0.00–0.07)
Basophils Absolute: 0 10*3/uL (ref 0.0–0.1)
Basophils Relative: 1 %
Eosinophils Absolute: 0.1 10*3/uL (ref 0.0–0.5)
Eosinophils Relative: 2 %
HCT: 41.3 % (ref 39.0–52.0)
Hemoglobin: 13.4 g/dL (ref 13.0–17.0)
Immature Granulocytes: 0 %
Lymphocytes Relative: 16 %
Lymphs Abs: 1 10*3/uL (ref 0.7–4.0)
MCH: 29.4 pg (ref 26.0–34.0)
MCHC: 32.4 g/dL (ref 30.0–36.0)
MCV: 90.6 fL (ref 80.0–100.0)
Monocytes Absolute: 0.5 10*3/uL (ref 0.1–1.0)
Monocytes Relative: 9 %
Neutro Abs: 4.7 10*3/uL (ref 1.7–7.7)
Neutrophils Relative %: 72 %
Platelets: 183 10*3/uL (ref 150–400)
RBC: 4.56 MIL/uL (ref 4.22–5.81)
RDW: 13.9 % (ref 11.5–15.5)
WBC: 6.4 10*3/uL (ref 4.0–10.5)
nRBC: 0 % (ref 0.0–0.2)

## 2021-05-20 LAB — COMPREHENSIVE METABOLIC PANEL
ALT: 28 U/L (ref 0–44)
AST: 76 U/L — ABNORMAL HIGH (ref 15–41)
Albumin: 3.5 g/dL (ref 3.5–5.0)
Alkaline Phosphatase: 66 U/L (ref 38–126)
Anion gap: 9 (ref 5–15)
BUN: 16 mg/dL (ref 8–23)
CO2: 26 mmol/L (ref 22–32)
Calcium: 8.4 mg/dL — ABNORMAL LOW (ref 8.9–10.3)
Chloride: 102 mmol/L (ref 98–111)
Creatinine, Ser: 1.01 mg/dL (ref 0.61–1.24)
GFR, Estimated: >60 mL/min (ref >60)
Glucose, Bld: 84 mg/dL (ref 70–99)
Potassium: 3.9 mmol/L (ref 3.5–5.1)
Sodium: 137 mmol/L (ref 135–145)
Total Bilirubin: 0.5 mg/dL (ref 0.3–1.2)
Total Protein: 6.7 g/dL (ref 6.5–8.1)

## 2021-05-20 LAB — CBC
HCT: 40.5 % (ref 39.0–52.0)
Hemoglobin: 13.4 g/dL (ref 13.0–17.0)
MCH: 30.2 pg (ref 26.0–34.0)
MCHC: 33.1 g/dL (ref 30.0–36.0)
MCV: 91.4 fL (ref 80.0–100.0)
Platelets: 187 10*3/uL (ref 150–400)
RBC: 4.43 MIL/uL (ref 4.22–5.81)
RDW: 13.8 % (ref 11.5–15.5)
WBC: 6.5 10*3/uL (ref 4.0–10.5)
nRBC: 0 % (ref 0.0–0.2)

## 2021-05-20 LAB — C-REACTIVE PROTEIN
CRP: 7.9 mg/dL — ABNORMAL HIGH (ref <1.0)
CRP: 9.1 mg/dL — ABNORMAL HIGH (ref <1.0)

## 2021-05-20 LAB — CREATININE, SERUM
Creatinine, Ser: 1.09 mg/dL (ref 0.61–1.24)
GFR, Estimated: >60 mL/min (ref >60)

## 2021-05-20 LAB — URINALYSIS, ROUTINE W REFLEX MICROSCOPIC
Bilirubin Urine: NEGATIVE
Glucose, UA: NEGATIVE mg/dL
Hgb urine dipstick: NEGATIVE
Ketones, ur: 5 mg/dL — AB
Leukocytes,Ua: NEGATIVE
Nitrite: NEGATIVE
Protein, ur: NEGATIVE mg/dL
Specific Gravity, Urine: 1.018 (ref 1.005–1.030)
pH: 5 (ref 5.0–8.0)

## 2021-05-20 LAB — PROCALCITONIN: Procalcitonin: <0.1 ng/mL

## 2021-05-20 LAB — FERRITIN
Ferritin: 263 ng/mL (ref 24–336)
Ferritin: 302 ng/mL (ref 24–336)

## 2021-05-20 LAB — MAGNESIUM: Magnesium: 2 mg/dL (ref 1.7–2.4)

## 2021-05-20 LAB — LACTATE DEHYDROGENASE: LDH: 203 U/L — ABNORMAL HIGH (ref 98–192)

## 2021-05-20 LAB — PHOSPHORUS: Phosphorus: 3.4 mg/dL (ref 2.5–4.6)

## 2021-05-20 LAB — FIBRINOGEN: Fibrinogen: 430 mg/dL (ref 210–475)

## 2021-05-20 MED ORDER — PREDNISONE 20 MG PO TABS: 50.0000 mg | ORAL_TABLET | Freq: Every day | ORAL | Status: DC

## 2021-05-20 MED ORDER — METHYLPREDNISOLONE SODIUM SUCC 40 MG IJ SOLR
0.5000 mg/kg | Freq: Two times a day (BID) | INTRAMUSCULAR | Status: AC
  Administered 2021-05-20 – 2021-05-22 (×4): 37.6 mg via INTRAVENOUS
  Filled 2021-05-20 (×4): qty 1

## 2021-05-20 MED ORDER — SODIUM CHLORIDE 0.9 % IV SOLN: INTRAVENOUS | Status: AC

## 2021-05-20 MED ORDER — PANTOPRAZOLE SODIUM 40 MG PO TBEC
40.0000 mg | DELAYED_RELEASE_TABLET | Freq: Every day | ORAL | Status: DC
  Administered 2021-05-20 – 2021-05-24 (×5): 40 mg via ORAL
  Filled 2021-05-20 (×5): qty 1

## 2021-05-20 MED ORDER — METHYLPREDNISOLONE SODIUM SUCC 500 MG IJ SOLR
0.5000 mg/kg | Freq: Two times a day (BID) | INTRAMUSCULAR | Status: DC
  Filled 2021-05-20: qty 37.4

## 2021-05-20 MED ORDER — PREDNISONE 50 MG PO TABS
50.0000 mg | ORAL_TABLET | Freq: Every day | ORAL | Status: DC
  Administered 2021-05-22 – 2021-05-24 (×3): 50 mg via ORAL
  Filled 2021-05-20 (×3): qty 1

## 2021-05-20 MED ORDER — GABAPENTIN 100 MG PO CAPS
100.0000 mg | ORAL_CAPSULE | Freq: Every day | ORAL | Status: DC
  Administered 2021-05-20 – 2021-05-23 (×4): 100 mg via ORAL
  Filled 2021-05-20 (×4): qty 1

## 2021-05-20 NOTE — ED Notes (Signed)
Pt cleaned up and placed in clean gown, linens and urinal given to pt. Pt given warm blankets at this time.

## 2021-05-20 NOTE — ED Notes (Signed)
Pharmacy messaged regarding missing medications.

## 2021-05-20 NOTE — ED Notes (Signed)
Pt attempted to urinate in urinal and missed. Bedding and chucks changed. Pt able to assist RN by helping to roll onto his side

## 2021-05-20 NOTE — ED Notes (Signed)
Pt assisted to seated position for breakfast and given urinal.  Pt denies pain and this time and has no other complaints

## 2021-05-20 NOTE — Progress Notes (Signed)
Patient ID: JAYCEON TROY, male   DOB: 1930/08/10, 88 y.o.   MRN: [id number] [facility] PROGRESS NOTE  EASTER SCHINKE WUJ:[id number] DOB: 1930-05-17 DOA: 05/19/2021 PCP: Tracie Harrier, MD  HPI/Subjective: Patient states that he feels like he did the last time he had COVID.  Still with some shortness of breath and a little cough.  He was weak and ended up on the ground.  Family saw him the next day.  Objective: Vitals:   05/20/21 1000 05/20/21 1100  BP: 110/69 102/64  Pulse: 77 66  Resp: 18 18  Temp:    SpO2: 94% 95%    Intake/Output Summary (Last 24 hours) at 05/20/2021 1245 Last data filed at 05/20/2021 0604 Gross per 24 hour  Intake 290 ml  Output 450 ml  Net -160 ml   There were no vitals filed for this visit.  ROS: Review of Systems  Constitutional:  Positive for malaise/fatigue.  Respiratory:  Positive for cough and shortness of breath.   Cardiovascular:  Negative for chest pain.  Gastrointestinal:  Negative for abdominal pain, nausea and vomiting.  Exam: Physical Exam HENT:     Head: Normocephalic.     Mouth/Throat:     Pharynx: No oropharyngeal exudate.  Eyes:     General: Lids are normal.     Conjunctiva/sclera: Conjunctivae normal.  Cardiovascular:     Rate and Rhythm: Normal rate and regular rhythm.     Heart sounds: Normal heart sounds, S1 normal and S2 normal.  Pulmonary:     Breath sounds: Examination of the right-lower field reveals decreased breath sounds. Examination of the left-lower field reveals decreased breath sounds. Decreased breath sounds present. No wheezing, rhonchi or rales.  Abdominal:     Palpations: Abdomen is soft.     Tenderness: There is no abdominal tenderness.  Musculoskeletal:     Right lower leg: No swelling.     Left lower leg: No swelling.  Skin:    General: Skin is warm.     Findings: No rash.  Neurological:     Mental Status: He is alert and oriented to person, place, and time.     Comments: Able to straight leg  raise bilaterally      Scheduled Meds:  vitamin C  500 mg Oral Daily   aspirin EC  81 mg Oral Daily   docusate sodium  100 mg Oral BID   gabapentin  100 mg Oral QHS   heparin  5,000 Units Subcutaneous Q8H   methylPREDNISolone (SOLU-MEDROL) injection  0.5 mg/kg Intravenous Q12H   Followed by   Derrill Memo ON 05/23/2021] predniSONE  50 mg Oral Daily   sodium chloride flush  3 mL Intravenous Q12H   zinc sulfate  220 mg Oral Daily   Continuous Infusions:  sodium chloride     sodium chloride     [START ON 05/21/2021] remdesivir 100 mg in NS 100 mL     Brief history:  88 year old man with past medical history of arthritis GERD, hyperlipidemia and hypertension.  He presents with being found on the floor with confusion.  He was diagnosed with acute rhabdomyolysis and COVID-19 infection.  Patient admitted 05/19/2020.  Assessment/Plan:  COVID-19 infection.  Admitting physician started remdesivir and steroids.  CRP elevated 9.1.  D-dimer borderline at 1.66.  Continue to watch inflammatory markers.  Currently saturating well on 2 L of oxygen.  We will see if we can taper off.  Chest x-ray negative. Acute metabolic encephalopathy seems to have improved. Acute rhabdomyolysis  from fall and being on the floor for an extended period of time plus taking lovastatin at home.  Hold lovastatin.  Restart IV fluid hydration.  Check CPK tomorrow morning. Neuropathy on gabapentin GERD on PPI Hyperlipidemia unspecified hold atorvastatin with acute rhabdomyolysis Weakness.  Physical therapy evaluation      Code Status:     Code Status Orders  (From admission, onward)           Start     Ordered   05/19/21 2343  Full code  Continuous        05/19/21 2345           Code Status History     Date Active Date Inactive Code Status Order ID Comments User Context   05/28/2020 2148 05/31/2020 2257 Full Code [id number]  Ivor Costa, MD Inpatient      Family Communication: Left message for patient's  daughter Disposition Plan: Status is: [place]  [facility]

## 2021-05-20 NOTE — Progress Notes (Signed)
Remdesivir - Pharmacy Brief Note   O:  CXR: "No active cardiopulmonary disease." SpO2: 94-98% on RA   A/P:  Remdesivir 200 mg IVPB once followed by 100 mg IVPB daily x 4 days.   Renda Rolls, PharmD, [facility] 05/20/2021 12:47 AM

## 2021-05-20 NOTE — Evaluation (Signed)
Physical Therapy Evaluation Patient Details Name: Thomas [name] MRN: [id number] DOB: [birth year] Today's Date: 05/20/2021  History of Present Illness  88 y.o. male who came to ED after being found on the floor by daughter.  He has some confusion and generalized weakness, found to have COVID. Pt had similar episode about a year ago.  Clinical Impression  Pt eager to work with PT but did not do as well regarding mobility as he indicated he expected to.  He was able to ambulate 40-45 ft but with very inconsistent, shuffling, forward leaning gait.  He showed poor safety awareness and despite heavy cuing (verbal and tactile) could not keep walker at appropriate distance/orientation and maintain weight over top of it. Pt's O2 remained in the 90s on room air with the effort but he showed poor overall safety awareness along with continued confusion.  Pt hopes to be able to ho home (not rehab) but will need 24/7 supervision to be able to safely do this.  Spoke with daughter who will check with family about the possibility of him discharging to one of their homes, as he would be unsafe at home alone.       Recommendations for follow up therapy are one component of a multi-disciplinary discharge planning process, led by the attending physician.  Recommendations may be updated based on patient status, additional functional criteria and insurance authorization.  Follow Up Recommendations Skilled nursing-short term rehab (<3 hours/day)    Assistance Recommended at Discharge Frequent or constant Supervision/Assistance  Patient can return home with the following  A little help with walking and/or transfers;A little help with bathing/dressing/bathroom;Assistance with cooking/housework;Assist for transportation;Help with stairs or ramp for entrance    Equipment Recommendations None recommended by PT  Recommendations for Other Services       Functional Status Assessment Patient has had a recent decline in their  functional status and demonstrates the ability to make significant improvements in function in a reasonable and predictable amount of time.     Precautions / Restrictions Precautions Precautions: Fall Restrictions Weight Bearing Restrictions: No      Mobility  Bed Mobility Overal bed mobility: Needs Assistance Bed Mobility: Supine to Sit     Supine to sit: Min assist     General bed mobility comments: Pt able to get partially to sitting but needed direct assist to attain (and initially maintain) sitting EOB    Transfers Overall transfer level: Needs assistance Equipment used: Rolling walker (2 wheels) Transfers: Sit to/from Stand Sit to Stand: Min guard           General transfer comment: Pt needed cues for hand placement and appropriate walker use, but did mange to rise from high gurney height without phyiscal assist to attain standing    Ambulation/Gait Ambulation/Gait assistance: Min assist;Mod assist Gait Distance (Feet): 45 Feet Assistive device: Rolling walker (2 wheels)         General Gait Details: 2 small loops toward door and back with inconsistent and at times unsafe gait.  He struggled (despite repeated heavy cuing) to keep walker close to his center of gravity and repeatedly needed direct assist to insure he did not get leaning too far forward.  Pt with no overt LOBs but showed poor awareness, very inconsistent/shuffling cadence and poor general insight about making adjustments despite plenty of cuing and reports of being able to ambulate well and independently at baseline  Science writer  Modified Rankin (Stroke Patients Only)       Balance Overall balance assessment: Needs assistance Sitting-balance support: No upper extremity supported Sitting balance-[name]: Fair Sitting balance - Comments: hunched posture, poor positional awareness     Standing balance-[name]: Poor Standing balance comment: heavy  forward lean on the walker (which he struggled to keep parallel to his travel).  Consistently reverting to poor, leaning posture with adjustments and continued cuing from PT                             Pertinent Vitals/Pain Pain Assessment: No/denies pain    Home Living Family/patient expects to be discharged to:: Unsure Living Arrangements: Alone Available Help at Discharge: Family;Available PRN/intermittently (daughters and son-in-law check on him with some consistency but not daily)   Home Access: Stairs to enter Entrance Stairs-Rails: Right;Left Entrance Stairs-Number of Steps: 2     Home Equipment: Conservation officer, nature (2 wheels);Cane - single point Additional Comments: Pt with continued confusion, unable to answer some (even repeated) questions meaningfully    Prior Function Prior Level of Function : Independent/Modified Independent             Mobility Comments: Pt reports that he typically gets around quite well with his walker, son-in-law helps with "what ever I need"       Hand Dominance        Extremity/Trunk Assessment   Upper Extremity Assessment Upper Extremity Assessment: Generalized weakness    Lower Extremity Assessment Lower Extremity Assessment: Generalized weakness       Communication   Communication: No difficulties  Cognition Arousal/Alertness: Awake/alert Behavior During Therapy: WFL for tasks assessed/performed Overall Cognitive Status: Difficult to assess                                 General Comments: Pt knew name and that he was in the hospital but fumbled over basic word finding and showed general confusion repeating himself, stopping mid sentence, etc        General Comments General comments (skin integrity, edema, etc.): Pt plesant and eager to work with PT but functinoally limited and far from his ostensible baseline.  Pt's O2 remained in the low 90s most of the ambulation effort on room air, HR 80-110 with  no excessive DOE or reported fatigue but poor general awareness with AD use and gross safety.    Exercises     Assessment/Plan    PT Assessment Patient needs continued PT services  PT Problem List Decreased strength;Decreased activity tolerance;Decreased balance;Decreased mobility;Decreased cognition;Decreased coordination;Decreased knowledge of use of DME;Decreased safety awareness       PT Treatment Interventions DME instruction;Gait training;Stair training;Functional mobility training;Therapeutic activities;Therapeutic exercise;Balance training;Neuromuscular re-education;Patient/family education;Cognitive remediation    PT Goals (Current goals can be found in the Care Plan section)  Acute Rehab PT Goals Patient Stated Goal: go home (with family) PT Goal Formulation: With patient/family Time For Goal Achievement: 06/03/21 Potential to Achieve Goals: Fair    Frequency Min 2X/week     Co-evaluation               AM-PAC PT "6 Clicks" Mobility  Outcome Measure Help needed turning from your back to your side while in a flat bed without using bedrails?: A Little Help needed moving from lying on your back to sitting on the side of a flat bed without using bedrails?: A  Little Help needed moving to and from a bed to a chair (including a wheelchair)?: A Little Help needed standing up from a chair using your arms (e.g., wheelchair or bedside chair)?: A Little Help needed to walk in hospital room?: A Lot Help needed climbing 3-5 steps with a railing? : Total 6 Click Score: 15    End of Session Equipment Utilized During Treatment: Gait belt Activity Tolerance: Patient limited by fatigue Patient left: with call bell/phone within reach;in bed Nurse Communication: Mobility status PT Visit Diagnosis: Muscle weakness (generalized) (M62.81);Difficulty in walking, not elsewhere classified (R26.2);Unsteadiness on feet (R26.81)    Time: 1410-1443 PT Time Calculation (min) (ACUTE  ONLY): 33 min   Charges:   PT Evaluation $PT Eval Low Complexity: 1 Low PT Treatments $Gait Training: 8-22 mins        Nixon Sparr R Selig [place]. DPT 05/20/2021, 4:34 PM

## 2021-05-21 ENCOUNTER — Other Ambulatory Visit: Payer: Self-pay

## 2021-05-21 DIAGNOSIS — U071 COVID-19: Principal | ICD-10-CM

## 2021-05-21 LAB — CBC WITH DIFFERENTIAL/PLATELET
Abs Immature Granulocytes: 0.01 10*3/uL (ref 0.00–0.07)
Basophils Absolute: 0 10*3/uL (ref 0.0–0.1)
Basophils Relative: 0 %
Eosinophils Absolute: 0 10*3/uL (ref 0.0–0.5)
Eosinophils Relative: 0 %
HCT: 39.8 % (ref 39.0–52.0)
Hemoglobin: 12.9 g/dL — ABNORMAL LOW (ref 13.0–17.0)
Immature Granulocytes: 0 %
Lymphocytes Relative: 23 %
Lymphs Abs: 0.9 10*3/uL (ref 0.7–4.0)
MCH: 29.7 pg (ref 26.0–34.0)
MCHC: 32.4 g/dL (ref 30.0–36.0)
MCV: 91.5 fL (ref 80.0–100.0)
Monocytes Absolute: 0.3 10*3/uL (ref 0.1–1.0)
Monocytes Relative: 6 %
Neutro Abs: 2.9 10*3/uL (ref 1.7–7.7)
Neutrophils Relative %: 71 %
Platelets: 176 10*3/uL (ref 150–400)
RBC: 4.35 MIL/uL (ref 4.22–5.81)
RDW: 13.6 % (ref 11.5–15.5)
WBC: 4.1 10*3/uL (ref 4.0–10.5)
nRBC: 0 % (ref 0.0–0.2)

## 2021-05-21 LAB — COMPREHENSIVE METABOLIC PANEL
ALT: 27 U/L (ref 0–44)
AST: 59 U/L — ABNORMAL HIGH (ref 15–41)
Albumin: 3.3 g/dL — ABNORMAL LOW (ref 3.5–5.0)
Alkaline Phosphatase: 62 U/L (ref 38–126)
Anion gap: 5 (ref 5–15)
BUN: 17 mg/dL (ref 8–23)
CO2: 26 mmol/L (ref 22–32)
Calcium: 8.4 mg/dL — ABNORMAL LOW (ref 8.9–10.3)
Chloride: 109 mmol/L (ref 98–111)
Creatinine, Ser: 0.97 mg/dL (ref 0.61–1.24)
GFR, Estimated: >60 mL/min (ref >60)
Glucose, Bld: 114 mg/dL — ABNORMAL HIGH (ref 70–99)
Potassium: 4.6 mmol/L (ref 3.5–5.1)
Sodium: 140 mmol/L (ref 135–145)
Total Bilirubin: 0.5 mg/dL (ref 0.3–1.2)
Total Protein: 6.3 g/dL — ABNORMAL LOW (ref 6.5–8.1)

## 2021-05-21 LAB — CK: Total CK: 1329 U/L — ABNORMAL HIGH (ref 49–397)

## 2021-05-21 LAB — PHOSPHORUS: Phosphorus: 4.1 mg/dL (ref 2.5–4.6)

## 2021-05-21 LAB — FERRITIN: Ferritin: 310 ng/mL (ref 24–336)

## 2021-05-21 LAB — C-REACTIVE PROTEIN: CRP: 8.3 mg/dL — ABNORMAL HIGH (ref <1.0)

## 2021-05-21 LAB — D-DIMER, QUANTITATIVE: D-Dimer, Quant: 1.34 ug/mL-FEU — ABNORMAL HIGH (ref 0.00–0.50)

## 2021-05-21 LAB — MAGNESIUM: Magnesium: 2.2 mg/dL (ref 1.7–2.4)

## 2021-05-21 NOTE — ED Notes (Signed)
Elana RN aware of assigned bed

## 2021-05-21 NOTE — Assessment & Plan Note (Addendum)
Present on admission with positive COVID-19 PCR in the setting of confusion, generalized weakness, cough and shortness of breath. CRP 9.1 on admission -- Continue remdesivir and prednisone -- Supportive care with antitussives, bronchodilators as needed, antipyretics as needed, oxygen support, vitamins --Monitor inflammatory markers, CBC, CMP

## 2021-05-21 NOTE — Assessment & Plan Note (Signed)
Continue gabapentin.

## 2021-05-21 NOTE — Hospital Course (Signed)
88 year old man with past medical history of arthritis GERD, hyperlipidemia and hypertension.  He presented to the ED on 05/19/2020 after being found on the floor at home with confusion.  He was diagnosed with acute rhabdomyolysis and COVID-19 infection.  Admitted to [facility] service and started on remdesivir and steroids given he was requiring 2 L/min oxygen at time of admission.

## 2021-05-21 NOTE — Progress Notes (Signed)
Progress Note   Patient: Thomas [name] BHA:[id number] DOB: Sep 24, 1930 DOA: 05/19/2021     2 DOS: the patient was seen and examined on 05/21/2021   Brief hospital course: 88 year old man with past medical history of arthritis GERD, hyperlipidemia and hypertension.  He presented to the ED on 05/19/2020 after being found on the floor at home with confusion.  He was diagnosed with acute rhabdomyolysis and COVID-19 infection.  Admitted to [facility] service and started on remdesivir and steroids given he was requiring 2 L/min oxygen at time of admission.   Assessment and Plan * COVID-19 virus infection- (present on admission) Present on admission with positive COVID-19 PCR in the setting of confusion, generalized weakness, cough and shortness of breath. CRP 9.1 on admission -- Continue remdesivir and steroids -- Supportive care with antitussives, bronchodilators as needed, antipyretics as needed, oxygen support, vitamins --Monitor inflammatory markers, CBC, CMP  Rhabdomyolysis due to COVID-19- (present on admission) Suspect due to COVID-19 infection and fall with some extended period of downtime at home prior to admission. CK trend improving with IV fluids 3544>> 1329. --Hold statin -- Continue IV fluids -- Follow CK level daily  Acute respiratory failure with hypoxia ([place])- (present on admission) Due to COVID-19 infection.  Fortunately chest x-ray negative for pneumonia.  Requiring 2 L/min supplemental O2 without requirement for supplemental oxygen at baseline. --Supplemental O2 to maintain sats above 90%, wean as tolerated -- Treat COVID as outlined  Acute metabolic encephalopathy- (present on admission) Present on admission, resolved.  Likely due to COVID-19 infection and associated hypoxia. -- Delirium precautions -- Treat underlying causes as outlined  Weakness PT and OT evaluations ordered.  SNF recommended for rehab.  TOC following for placement.  Will require 10-day isolation.  Prior to SNF  discharge. -- Continue PT and OT here as often as possible -- Mobilize and out of bed to chair daily -- Up in chair for meals -- Fall precautions  Hypertension- (present on admission) Appears not on antihypertensives as outpatient.  Monitor blood pressure.  HLD (hyperlipidemia)- (present on admission) Atorvastatin on hold in the setting of acute rhabdomyolysis  GERD (gastroesophageal reflux disease)- (present on admission) Continue PPI  Neuropathy Continue gabapentin     Subjective: Patient seen in the ED holding for bed today.  He reports feeling okay and is able to recall all the details of events at home prior to admission including when he fell while trying to ambulate around the house with his walker just due to weakness.  He did not have any dizziness or lightheadedness, chest pain or palpitations he said his legs just became too weak to hold him up.  He denies fevers chills, shortness of breath, chest pain.  Reports ongoing occasional cough but no other acute complaints at this time.  Objective Vitals reviewed and notable for 2 L/min oxygen requirement, heart rate in the 50s to 60s overall with some HR readings in the upper 40s this morning.  BP stable, somewhat labile overnight 109/67, this morning as high as 179/94 at 9 AM.  No fevers.  General exam: awake, alert, no acute distress, mildly ill-appearing HEENT: moist mucus membranes, hearing grossly normal  Respiratory system: CTAB, no wheezes, rales or rhonchi, normal respiratory effort, on 2 L/min [place] O2. Cardiovascular system: normal S1/S2, RRR, no pedal edema.   Gastrointestinal system: soft, NT, ND, no HSM felt, +bowel sounds. Central nervous system: A&O x3. no gross focal neurologic deficits, normal speech Extremities: moves all, no edema, normal tone Skin: dry, intact,  normal temperature Psychiatry: normal mood, congruent affect, judgement and insight appear normal   Data Reviewed:  Labs reviewed and notable for  glucose 114, albumin 3.3, AST 59, CK13 29, CRP 8.3 (down from 9.1), hemoglobin 12.9 stable, D-dimer 1.34 (down from 1.66), UA negative for infection  Family Communication: None at bedside will attempt to call  Disposition: Status is: Inpatient  Remains inpatient appropriate because: Severity of illness on IV therapies for COVID as outlined above, IV fluids for rhabdomyolysis         Time spent: 35 minutes  Author: Ezekiel Slocumb, DO 05/21/2021 8:33 PM  For on call review [email or web address].

## 2021-05-21 NOTE — ED Notes (Signed)
Pt is asleep at this time on the monitor.

## 2021-05-21 NOTE — Assessment & Plan Note (Addendum)
Resume Suspect due to COVID-19 infection and fall with some extended period of downtime at home prior to admission. CK normalized, 270 this AM after few days of IV fluids. -- Resume statin, held on admission -- Continue IV fluids, stopped this evening -- Recheck CK tomorrow morning

## 2021-05-21 NOTE — ED Notes (Signed)
Pt. Sitting in stretcher, family at bedside, conversing in full sentences, denies any SOB. NAD. Pt. Denies any further need at this time.

## 2021-05-21 NOTE — Assessment & Plan Note (Addendum)
Resolved.  Present on admission, resolved.  Likely due to COVID-19 infection and associated hypoxia. Patient appears to have underlying cognitive impairment, likely chronic ?dementia -- Delirium precautions -- Treat underlying causes as outlined

## 2021-05-21 NOTE — ED Notes (Signed)
Pt is asleep with equal rise and fall of the chest at this time.

## 2021-05-21 NOTE — Assessment & Plan Note (Addendum)
PT and OT evaluations ordered.   SNF recommended for rehab.   TOC following for placement.   Will require 10-day isolation for SNF discharge. Patient hopes to be able to return home. -- Continue PT and OT here as often as possible -- Mobilize and out of bed to chair daily -- Up in chair for meals -- Fall precautions

## 2021-05-21 NOTE — Assessment & Plan Note (Addendum)
Lovastatin was held on admission due to acute rhabdomyolysis, now resolved.  Resume statin (formulary sub is pravastatin).

## 2021-05-21 NOTE — Assessment & Plan Note (Addendum)
Due to COVID-19 infection.  Fortunately chest x-ray negative for pneumonia.  Requiring 2 L/min supplemental O2 without requirement for supplemental oxygen at baseline. --Supplemental O2 to maintain sats above 90%, wean as tolerated -- Treat COVID as outlined 1/13: Patient on 2 L/min nasal cannula O2

## 2021-05-21 NOTE — Assessment & Plan Note (Signed)
Continue PPI ?

## 2021-05-21 NOTE — ED Notes (Signed)
Patient provided with breakfast tray.

## 2021-05-21 NOTE — Assessment & Plan Note (Addendum)
Appears not on antihypertensives as outpatient.  Monitor blood pressure.  Hold off scheduling antihypertensive for now, high risk for hypotension and falls. 1/13: BP better but still elevated at times, likely related to steroids

## 2021-05-22 LAB — COMPREHENSIVE METABOLIC PANEL
ALT: 36 U/L (ref 0–44)
AST: 54 U/L — ABNORMAL HIGH (ref 15–41)
Albumin: 3.2 g/dL — ABNORMAL LOW (ref 3.5–5.0)
Alkaline Phosphatase: 57 U/L (ref 38–126)
Anion gap: 6 (ref 5–15)
BUN: 20 mg/dL (ref 8–23)
CO2: 26 mmol/L (ref 22–32)
Calcium: 8.8 mg/dL — ABNORMAL LOW (ref 8.9–10.3)
Chloride: 108 mmol/L (ref 98–111)
Creatinine, Ser: 0.87 mg/dL (ref 0.61–1.24)
GFR, Estimated: >60 mL/min (ref >60)
Glucose, Bld: 106 mg/dL — ABNORMAL HIGH (ref 70–99)
Potassium: 4.2 mmol/L (ref 3.5–5.1)
Sodium: 140 mmol/L (ref 135–145)
Total Bilirubin: 0.4 mg/dL (ref 0.3–1.2)
Total Protein: 6.4 g/dL — ABNORMAL LOW (ref 6.5–8.1)

## 2021-05-22 LAB — CBC WITH DIFFERENTIAL/PLATELET
Abs Immature Granulocytes: 0.01 10*3/uL (ref 0.00–0.07)
Basophils Absolute: 0 10*3/uL (ref 0.0–0.1)
Basophils Relative: 0 %
Eosinophils Absolute: 0 10*3/uL (ref 0.0–0.5)
Eosinophils Relative: 0 %
HCT: 40.7 % (ref 39.0–52.0)
Hemoglobin: 13.2 g/dL (ref 13.0–17.0)
Immature Granulocytes: 0 %
Lymphocytes Relative: 19 %
Lymphs Abs: 1.1 10*3/uL (ref 0.7–4.0)
MCH: 30.1 pg (ref 26.0–34.0)
MCHC: 32.4 g/dL (ref 30.0–36.0)
MCV: 92.7 fL (ref 80.0–100.0)
Monocytes Absolute: 0.3 10*3/uL (ref 0.1–1.0)
Monocytes Relative: 6 %
Neutro Abs: 4.3 10*3/uL (ref 1.7–7.7)
Neutrophils Relative %: 75 %
Platelets: 203 10*3/uL (ref 150–400)
RBC: 4.39 MIL/uL (ref 4.22–5.81)
RDW: 13.4 % (ref 11.5–15.5)
WBC: 5.7 10*3/uL (ref 4.0–10.5)
nRBC: 0 % (ref 0.0–0.2)

## 2021-05-22 LAB — FERRITIN: Ferritin: 256 ng/mL (ref 24–336)

## 2021-05-22 LAB — C-REACTIVE PROTEIN: CRP: 3.4 mg/dL — ABNORMAL HIGH (ref <1.0)

## 2021-05-22 LAB — MAGNESIUM: Magnesium: 2.3 mg/dL (ref 1.7–2.4)

## 2021-05-22 LAB — PHOSPHORUS: Phosphorus: 2.3 mg/dL — ABNORMAL LOW (ref 2.5–4.6)

## 2021-05-22 LAB — CK: Total CK: 475 U/L — ABNORMAL HIGH (ref 49–397)

## 2021-05-22 LAB — D-DIMER, QUANTITATIVE: D-Dimer, Quant: 1.13 ug/mL-FEU — ABNORMAL HIGH (ref 0.00–0.50)

## 2021-05-22 MED ORDER — POTASSIUM & SODIUM PHOSPHATES 280-160-250 MG PO PACK
1.0000 | PACK | Freq: Three times a day (TID) | ORAL | Status: AC
  Administered 2021-05-22 – 2021-05-23 (×4): 1 via ORAL
  Filled 2021-05-22 (×4): qty 1

## 2021-05-22 NOTE — Progress Notes (Addendum)
Physical Therapy Treatment Patient Details Name: Thomas [name] MRN: [id number] DOB: 1930-10-03 Today's Date: 05/22/2021   History of Present Illness 88 y.o. male who came to ED after being found on the floor by daughter.  He has some confusion and generalized weakness, found to have COVID. Pt had similar episode about a year ago.    PT Comments    Pt was long sitting in bed upon arriving. He was holding his condom catheter in his hand. Pt is alert and cooperative throughout session however severely limited by cognition. Physically, pt is progressing extremely well. Author does not feel pt can safely manage at home alone. Contacted pt's daughter to discuss need for 24/7 supervision. She states she will discuss with pt/family. Pt was easily able to exit bed, stand, and ambulate with supervision only. If pt is unable to have 24/7 assistance at home, PT recommends memory unit or long term placement. We will continue to follow per current POC. RN tech made aware that bed was soaked at conclusion of session.     Recommendations for follow up therapy are one component of a multi-disciplinary discharge planning process, led by the attending physician.  Recommendations may be updated based on patient status, additional functional criteria and insurance authorization.  Follow Up Recommendations  Other (comment) (PT recommends 24/7 assistance at DC due to cognition. Memory care is most appropriate if unable to have 24/7 assistance at home)     Assistance Recommended at Discharge Frequent or constant Supervision/Assistance  Patient can return home with the following A little help with walking and/or transfers;A little help with bathing/dressing/bathroom;Assistance with cooking/housework;Assist for transportation;Help with stairs or ramp for entrance   Equipment Recommendations  RW if pt does not have one already      Precautions / Restrictions Precautions Precautions: Fall Restrictions Weight Bearing  Restrictions: No     Mobility  Bed Mobility Overal bed mobility: Needs Assistance Bed Mobility: Supine to Sit     Supine to sit: Supervision   General bed mobility comments: No physical assistance required however vcs for improved safety and sequencing.    Transfers Overall transfer level: Needs assistance Equipment used: Rolling walker (2 wheels) Transfers: Sit to/from Stand Sit to Stand: Supervision     Ambulation/Gait Ambulation/Gait assistance: Supervision Gait Distance (Feet): 120 Feet Assistive device: Rolling walker (2 wheels) Gait Pattern/deviations: Step-through pattern Gait velocity: decreased     General Gait Details: pt ambulated 120 ft without difficulty or safety concern.    Balance Overall balance assessment: Needs assistance Sitting-balance support: No upper extremity supported;Feet supported Sitting balance-[name]: Good     Standing balance support: During functional activity;Reliant on assistive device for balance Standing balance-[name]: Good        Cognition Arousal/Alertness: Awake/alert Behavior During Therapy: WFL for tasks assessed/performed Overall Cognitive Status: No family/caregiver present to determine baseline cognitive functioning Area of Impairment: Orientation;Following commands;Safety/judgement;Awareness;Problem solving       General Comments: Pt is alert however confused. He endorses wanting to go home ASAP. Cognition is major concern going forward.               Pertinent Vitals/Pain Pain Assessment: No/denies pain     PT Goals (current goals can now be found in the care plan section) Acute Rehab PT Goals Patient Stated Goal: go home (with family) Progress towards PT goals: Progressing toward goals    Frequency    Min 2X/week      PT Plan Discharge plan needs to be updated  AM-PAC PT "6 Clicks" Mobility   Outcome Measure  Help needed turning from your back to your side while in a flat  bed without using bedrails?: A Little Help needed moving from lying on your back to sitting on the side of a flat bed without using bedrails?: A Little Help needed moving to and from a bed to a chair (including a wheelchair)?: A Little Help needed standing up from a chair using your arms (e.g., wheelchair or bedside chair)?: A Little Help needed to walk in hospital room?: A Little Help needed climbing 3-5 steps with a railing? : A Little 6 Click Score: 18    End of Session Equipment Utilized During Treatment: Gait belt Activity Tolerance: Patient limited by fatigue Patient left: in bed;with call bell/phone within reach;with bed alarm set;with nursing/sitter in room Nurse Communication: Mobility status PT Visit Diagnosis: Muscle weakness (generalized) (M62.81);Difficulty in walking, not elsewhere classified (R26.2);Unsteadiness on feet (R26.81)     Time: 2641-5830 PT Time Calculation (min) (ACUTE ONLY): 22 min  Charges:  $Gait Training: 8-22 mins                     Julaine Fusi PTA 05/22/21, 5:43 PM

## 2021-05-22 NOTE — Progress Notes (Addendum)
Progress Note   Patient: Thomas [name] MWN:[id number] DOB: 26-Apr-1931 DOA: 05/19/2021     3 DOS: the patient was seen and examined on 05/22/2021   Brief hospital course: 88 year old man with past medical history of arthritis GERD, hyperlipidemia and hypertension.  He presented to the ED on 05/19/2020 after being found on the floor at home with confusion.  He was diagnosed with acute rhabdomyolysis and COVID-19 infection.  Admitted to [facility] service and started on remdesivir and steroids given he was requiring 2 L/min oxygen at time of admission.   Assessment and Plan * COVID-19 virus infection- (present on admission) Present on admission with positive COVID-19 PCR in the setting of confusion, generalized weakness, cough and shortness of breath. CRP 9.1 on admission -- Continue remdesivir and steroids -- Supportive care with antitussives, bronchodilators as needed, antipyretics as needed, oxygen support, vitamins --Monitor inflammatory markers, CBC, CMP  Rhabdomyolysis due to COVID-19- (present on admission) Suspect due to COVID-19 infection and fall with some extended period of downtime at home prior to admission. CK trend improving with IV fluids 3544>> 1329>> 475 today (near normal). --Hold statin -- Continue IV fluids, stopped this evening -- Recheck CK tomorrow morning  Acute respiratory failure with hypoxia ([place])- (present on admission) Due to COVID-19 infection.  Fortunately chest x-ray negative for pneumonia.  Requiring 2 L/min supplemental O2 without requirement for supplemental oxygen at baseline. --Supplemental O2 to maintain sats above 90%, wean as tolerated -- Treat COVID as outlined 1/12: Patient on 2 L/min nasal cannula O2  Acute metabolic encephalopathy- (present on admission) Resolved.  Present on admission, resolved.  Likely due to COVID-19 infection and associated hypoxia. -- Delirium precautions -- Treat underlying causes as outlined  Weakness PT and OT evaluations  ordered.   SNF recommended for rehab.   TOC following for placement.   Will require 10-day isolation for SNF discharge. Patient hopes to be able to return home. -- Continue PT and OT here as often as possible -- Mobilize and out of bed to chair daily -- Up in chair for meals -- Fall precautions  Hypertension- (present on admission) Appears not on antihypertensives as outpatient.  Monitor blood pressure. 1/12: BP mildly elevated, likely related to steroids  HLD (hyperlipidemia)- (present on admission) Atorvastatin on hold in the setting of acute rhabdomyolysis  GERD (gastroesophageal reflux disease)- (present on admission) Continue PPI  Hypophosphatemia Phos-NaK ordered for replacement. Recheck Phos level with morning labs.  Neuropathy Continue gabapentin     Subjective: Patient awake sitting up in bed when seen this morning.  He reports feeling better and hopes to go home tomorrow.  He reports he had a single minor coughing episode after swallowing but this is not typical for him.  Denies any cough or shortness of breath since then.  No fevers chills or other acute complaints at this time.  Objective Vitals reviewed and notable for BP mildly elevated this morning 150/92  General exam: awake, alert, no acute distress HEENT: moist mucus membranes, hearing grossly normal  Respiratory system: CTAB with diminished bases, no wheezes, rales or rhonchi, normal respiratory effort at rest.  On 2 L/min nasal cannula O2 Cardiovascular system: normal S1/S2, RRR, no pedal edema.   Gastrointestinal system: soft, NT, ND, no HSM felt, +bowel sounds. Central nervous system: A&O x3. no gross focal neurologic deficits, normal speech Extremities: moves all, no edema, normal tone Skin: dry, intact, normal temperature Psychiatry: normal mood, congruent affect, judgement and insight appear normal   Data Reviewed:  Labs  reviewed and notable for glucose 106, phosphorus 2.3 albumin 3.2, AST 54  (improving), CK down to 475, CBC normal, D-dimer improved to 1.13  Family Communication: None at bedside during encounter, will attempt to call  Disposition: Status is: Inpatient  Remains inpatient appropriate because: Severity of illness on IV therapies as outlined above, and currently requiring supplemental oxygen         Time spent: 35 minutes  Author: Ezekiel Slocumb, DO 05/22/2021 1:43 PM  For on call review [email or web address].

## 2021-05-22 NOTE — Assessment & Plan Note (Addendum)
Resolved.  Phos-NaK given on 1/12 for replacement.  Monitor Phos and replace PRN.

## 2021-05-23 LAB — COMPREHENSIVE METABOLIC PANEL
ALT: 75 U/L — ABNORMAL HIGH (ref 0–44)
AST: 68 U/L — ABNORMAL HIGH (ref 15–41)
Albumin: 3.2 g/dL — ABNORMAL LOW (ref 3.5–5.0)
Alkaline Phosphatase: 57 U/L (ref 38–126)
Anion gap: 7 (ref 5–15)
BUN: 19 mg/dL (ref 8–23)
CO2: 27 mmol/L (ref 22–32)
Calcium: 8.7 mg/dL — ABNORMAL LOW (ref 8.9–10.3)
Chloride: 105 mmol/L (ref 98–111)
Creatinine, Ser: 0.83 mg/dL (ref 0.61–1.24)
GFR, Estimated: >60 mL/min (ref >60)
Glucose, Bld: 102 mg/dL — ABNORMAL HIGH (ref 70–99)
Potassium: 3.9 mmol/L (ref 3.5–5.1)
Sodium: 139 mmol/L (ref 135–145)
Total Bilirubin: 0.6 mg/dL (ref 0.3–1.2)
Total Protein: 6.4 g/dL — ABNORMAL LOW (ref 6.5–8.1)

## 2021-05-23 LAB — D-DIMER, QUANTITATIVE: D-Dimer, Quant: 1.21 ug/mL-FEU — ABNORMAL HIGH (ref 0.00–0.50)

## 2021-05-23 LAB — CK: Total CK: 270 U/L (ref 49–397)

## 2021-05-23 LAB — MAGNESIUM: Magnesium: 2.2 mg/dL (ref 1.7–2.4)

## 2021-05-23 LAB — C-REACTIVE PROTEIN: CRP: 1.6 mg/dL — ABNORMAL HIGH (ref <1.0)

## 2021-05-23 LAB — PHOSPHORUS: Phosphorus: 3.4 mg/dL (ref 2.5–4.6)

## 2021-05-23 MED ORDER — PRAVASTATIN SODIUM 20 MG PO TABS
20.0000 mg | ORAL_TABLET | Freq: Every day | ORAL | Status: DC
  Administered 2021-05-23: 20 mg via ORAL
  Filled 2021-05-23: qty 1

## 2021-05-23 MED ORDER — POLYETHYLENE GLYCOL 3350 17 G PO PACK
17.0000 g | PACK | Freq: Every day | ORAL | Status: DC
  Administered 2021-05-23: 17 g via ORAL
  Filled 2021-05-23: qty 1

## 2021-05-23 MED ORDER — SENNOSIDES-DOCUSATE SODIUM 8.6-50 MG PO TABS
1.0000 | ORAL_TABLET | Freq: Two times a day (BID) | ORAL | Status: DC
  Administered 2021-05-23 – 2021-05-24 (×3): 1 via ORAL
  Filled 2021-05-23 (×3): qty 1

## 2021-05-23 MED ORDER — ALPRAZOLAM 0.25 MG PO TABS
0.2500 mg | ORAL_TABLET | Freq: Once | ORAL | Status: AC
Start: 2021-05-23 — End: 2021-05-23
  Administered 2021-05-23: 03:00:00 0.25 mg via ORAL
  Filled 2021-05-23: qty 1

## 2021-05-23 NOTE — Plan of Care (Signed)
  Problem: Pain Managment: Goal: General experience of comfort will improve Outcome: Progressing   Problem: Safety: Goal: Ability to remain free from injury will improve Outcome: Progressing   Problem: Skin Integrity: Goal: Risk for impaired skin integrity will decrease Outcome: Progressing   

## 2021-05-23 NOTE — Progress Notes (Signed)
Progress Note   Patient: Thomas [name] [id number] DOB: Aug 18, 1930 DOA: 05/19/2021     4 DOS: the patient was seen and examined on 05/23/2021   Brief hospital course: 88 year old man with past medical history of arthritis GERD, hyperlipidemia and hypertension.  He presented to the ED on 05/19/2020 after being found on the floor at home with confusion.  He was diagnosed with acute rhabdomyolysis and COVID-19 infection.  Admitted to [facility] service and started on remdesivir and steroids given he was requiring 2 L/min oxygen at time of admission.   Assessment and Plan * COVID-19 virus infection- (present on admission) Present on admission with positive COVID-19 PCR in the setting of confusion, generalized weakness, cough and shortness of breath. CRP 9.1 on admission -- Continue remdesivir and prednisone -- Supportive care with antitussives, bronchodilators as needed, antipyretics as needed, oxygen support, vitamins --Monitor inflammatory markers, CBC, CMP  Rhabdomyolysis due to COVID-19- (present on admission) Resume Suspect due to COVID-19 infection and fall with some extended period of downtime at home prior to admission. CK normalized, 270 this AM after few days of IV fluids. -- Resume statin, held on admission -- Continue IV fluids, stopped this evening -- Recheck CK tomorrow morning  Acute respiratory failure with hypoxia ([place])- (present on admission) Due to COVID-19 infection.  Fortunately chest x-ray negative for pneumonia.  Requiring 2 L/min supplemental O2 without requirement for supplemental oxygen at baseline. --Supplemental O2 to maintain sats above 90%, wean as tolerated -- Treat COVID as outlined 1/13: Patient on 2 L/min nasal cannula O2  Acute metabolic encephalopathy- (present on admission) Resolved.  Present on admission, resolved.  Likely due to COVID-19 infection and associated hypoxia. Patient appears to have underlying cognitive impairment, likely chronic ?dementia --  Delirium precautions -- Treat underlying causes as outlined  Weakness PT and OT evaluations ordered.   SNF recommended for rehab.   TOC following for placement.   Will require 10-day isolation for SNF discharge. Patient hopes to be able to return home. -- Continue PT and OT here as often as possible -- Mobilize and out of bed to chair daily -- Up in chair for meals -- Fall precautions  Hypertension- (present on admission) Appears not on antihypertensives as outpatient.  Monitor blood pressure.  Hold off scheduling antihypertensive for now, high risk for hypotension and falls. 1/13: BP better but still elevated at times, likely related to steroids   HLD (hyperlipidemia)- (present on admission) Lovastatin was held on admission due to acute rhabdomyolysis, now resolved.  Resume statin (formulary sub is pravastatin).  GERD (gastroesophageal reflux disease)- (present on admission) Continue PPI  Hypophosphatemia Resolved.  Phos-NaK given on 1/12 for replacement.  Monitor Phos and replace PRN.  Neuropathy Continue gabapentin     Subjective: Patient was awake sitting up in bed when seen today.  He reports feeling very well.  Still has an occasional cough which she says is improving.  He denies any shortness of breath fevers or chills.  Still on oxygen which she said he typically does not need.  He hopes to go home soon.  Objective Vitals reviewed and notable for 2 L/min nasal cannula oxygen in place, BP elevated at times but somewhat labile.  Overnight BP as high as 177/103, this morning BP was 131/81.  No fevers.  Heart rate 60s to 70s.  Normal respiratory rate.  General exam: awake, alert, no acute distress, pleasantly confused HEENT: moist mucus membranes, hearing grossly normal  Respiratory system: CTAB, no wheezes, rales or rhonchi,  normal respiratory effort at rest, on 2 L/min nasal cannula O2. Cardiovascular system: normal S1/S2, RRR, no pedal edema.   Gastrointestinal  system: Mildly distended but nontender abdomen, hypoactive bowel sounds. Central nervous system: A&O x3. no gross focal neurologic deficits, normal speech Extremities: moves all, no edema, normal tone Skin: dry, intact, normal temperature Psychiatry: normal mood, congruent affect   Data Reviewed:  Labs reviewed and notable for glucose 102, calcium 8.7, albumin 3.2, AST 68, ALT 75, CRP improved to 1.6 from 3.4, CK normalized to 70, D-dimer 1.21 relatively stable    Family Communication: Updated daughter, Juliann Pulse, by phone this afternoon.  Informed her of likely discharge readiness tomorrow.  She feels he will have enough help at home and wants him to return there, since that is what patient wishes to do.   Disposition: Status is: Inpatient  Remains inpatient appropriate because: Remains on IV therapy and supplemental oxygen at this time.  Will require 24/7 assistance if discharged home otherwise may require placement.        Time spent: 35 minutes  Author: Ezekiel Slocumb, DO 05/23/2021 2:54 PM  For on call review [email or web address].

## 2021-05-24 LAB — COMPREHENSIVE METABOLIC PANEL
ALT: 89 U/L — ABNORMAL HIGH (ref 0–44)
AST: 68 U/L — ABNORMAL HIGH (ref 15–41)
Albumin: 3.5 g/dL (ref 3.5–5.0)
Alkaline Phosphatase: 61 U/L (ref 38–126)
Anion gap: 7 (ref 5–15)
BUN: 24 mg/dL — ABNORMAL HIGH (ref 8–23)
CO2: 28 mmol/L (ref 22–32)
Calcium: 9.1 mg/dL (ref 8.9–10.3)
Chloride: 103 mmol/L (ref 98–111)
Creatinine, Ser: 0.97 mg/dL (ref 0.61–1.24)
GFR, Estimated: >60 mL/min (ref >60)
Glucose, Bld: 76 mg/dL (ref 70–99)
Potassium: 3.9 mmol/L (ref 3.5–5.1)
Sodium: 138 mmol/L (ref 135–145)
Total Bilirubin: 0.5 mg/dL (ref 0.3–1.2)
Total Protein: 6.6 g/dL (ref 6.5–8.1)

## 2021-05-24 LAB — PHOSPHORUS: Phosphorus: 2.9 mg/dL (ref 2.5–4.6)

## 2021-05-24 LAB — C-REACTIVE PROTEIN: CRP: 1 mg/dL — ABNORMAL HIGH (ref <1.0)

## 2021-05-24 MED ORDER — GUAIFENESIN-DM 100-10 MG/5ML PO SYRP: 10.0000 mL | ORAL_SOLUTION | ORAL | 0 refills | Status: DC | PRN

## 2021-05-24 MED ORDER — ALBUTEROL SULFATE HFA 108 (90 BASE) MCG/ACT IN AERS: 2.0000 | INHALATION_SPRAY | RESPIRATORY_TRACT | 0 refills | Status: DC | PRN

## 2021-05-24 MED ORDER — PREDNISONE 10 MG PO TABS: ORAL_TABLET | ORAL | 0 refills | Status: AC

## 2021-05-24 MED ORDER — SENNA 8.6 MG PO TABS
1.0000 | ORAL_TABLET | Freq: Every evening | ORAL | 0 refills | Status: DC | PRN
Start: 2021-05-24 — End: 2023-04-19

## 2021-05-24 NOTE — Progress Notes (Signed)
Discharge instructions reviewed with family.

## 2021-05-24 NOTE — Discharge Summary (Signed)
Physician Discharge Summary   Patient: Thomas [name] MRN: [id number] DOB: 17-Nov-1930  Admit date:     05/19/2021  Discharge date: 05/24/21  Discharge Physician: Ezekiel Slocumb   PCP: Tracie Harrier, MD   Recommendations at discharge:    Follow up with PCP in 1-2 weeks Follow up CBC/BMP in 1-2 weeks   Discharge Diagnoses Principal Problem:   COVID-19 virus infection Active Problems:   Acute metabolic encephalopathy   Acute respiratory failure with hypoxia (HCC)   Rhabdomyolysis due to COVID-19   Weakness   Hypertension   HLD (hyperlipidemia)   GERD (gastroesophageal reflux disease)   Neuropathy   Hypophosphatemia  Resolved Problems:   AMS (altered mental status)   Hospital Course   88 year old man with past medical history of arthritis GERD, hyperlipidemia and hypertension.  He presented to the ED on 05/19/2020 after being found on the floor at home with confusion.  He was diagnosed with acute rhabdomyolysis and COVID-19 infection.  Admitted to [facility] service and started on remdesivir and steroids given he was requiring 2 L/min oxygen at time of admission.   Treated for Covid-19 with remdesivir and steroids. Rhabdomyolysis improved with fluids. Clinically improved and stable for discharge home.   * COVID-19 virus infection- (present on admission) Present on admission with positive COVID-19 PCR in the setting of confusion, generalized weakness, cough and shortness of breath. CRP 9.1 on admission -- Continue remdesivir and prednisone -- Supportive care with antitussives, bronchodilators as needed, antipyretics as needed, oxygen support, vitamins --Monitor inflammatory markers, CBC, CMP  Rhabdomyolysis due to COVID-19- (present on admission) Resume Suspect due to COVID-19 infection and fall with some extended period of downtime at home prior to admission. CK normalized, 270 this AM after few days of IV fluids. -- Resume statin, held on admission -- Continue IV fluids,  stopped this evening -- Recheck CK tomorrow morning  Acute respiratory failure with hypoxia ([place])- (present on admission) Due to COVID-19 infection.  Fortunately chest x-ray negative for pneumonia.  Requiring 2 L/min supplemental O2 without requirement for supplemental oxygen at baseline. --Supplemental O2 to maintain sats above 90%, wean as tolerated -- Treat COVID as outlined 1/13: Patient on 2 L/min nasal cannula O2  Acute metabolic encephalopathy- (present on admission) Resolved.  Present on admission, resolved.  Likely due to COVID-19 infection and associated hypoxia. Patient appears to have underlying cognitive impairment, likely chronic ?dementia -- Delirium precautions -- Treat underlying causes as outlined  Weakness PT and OT evaluations ordered.   SNF recommended for rehab.   TOC following for placement.   Will require 10-day isolation for SNF discharge. Patient hopes to be able to return home. -- Continue PT and OT here as often as possible -- Mobilize and out of bed to chair daily -- Up in chair for meals -- Fall precautions  Hypertension- (present on admission) Appears not on antihypertensives as outpatient.  Monitor blood pressure.  Hold off scheduling antihypertensive for now, high risk for hypotension and falls. 1/13: BP better but still elevated at times, likely related to steroids   HLD (hyperlipidemia)- (present on admission) Lovastatin was held on admission due to acute rhabdomyolysis, now resolved.  Resume statin (formulary sub is pravastatin).  GERD (gastroesophageal reflux disease)- (present on admission) Continue PPI  Hypophosphatemia Resolved.  Phos-NaK given on 1/12 for replacement.  Monitor Phos and replace PRN.  Neuropathy Continue gabapentin        Consultants: none Procedures performed: none  Disposition: Home Diet recommendation: Cardiac diet  DISCHARGE  MEDICATION: Allergies as of 05/24/2021   No Known Allergies      Medication  List     STOP taking these medications    ciprofloxacin 0.3 % ophthalmic solution Commonly known as: CILOXAN   traZODone 50 MG tablet Commonly known as: DESYREL   zinc sulfate 220 (50 Zn) MG capsule       TAKE these medications    acetaminophen 325 MG tablet Commonly known as: TYLENOL Take 2 tablets (650 mg total) by mouth every 6 (six) hours as needed for fever, headache or moderate pain.   albuterol 108 (90 Base) MCG/ACT inhaler Commonly known as: VENTOLIN HFA Inhale 2 puffs into the lungs every 4 (four) hours as needed for wheezing or shortness of breath.   ascorbic acid 500 MG tablet Commonly known as: VITAMIN C Take 1 tablet (500 mg total) by mouth daily.   aspirin EC 81 MG tablet Take by mouth.   gabapentin 100 MG capsule Commonly known as: NEURONTIN Take 100 mg by mouth at bedtime.   guaiFENesin-dextromethorphan 100-10 MG/5ML syrup Commonly known as: ROBITUSSIN DM Take 10 mLs by mouth every 4 (four) hours as needed for cough.   lovastatin 40 MG tablet Commonly known as: MEVACOR Take by mouth.   omeprazole 20 MG capsule Commonly known as: PRILOSEC Take 1 capsule by mouth daily.   predniSONE 10 MG tablet Commonly known as: DELTASONE Take 4 tablets (40 mg total) by mouth daily with breakfast for 1 day, THEN 3 tablets (30 mg total) daily with breakfast for 1 day, THEN 2 tablets (20 mg total) daily with breakfast for 1 day, THEN 1 tablet (10 mg total) daily with breakfast for 1 day. Start taking on: May 24, 2021   senna 8.6 MG Tabs tablet Commonly known as: SENOKOT Take 1 tablet (8.6 mg total) by mouth at bedtime as needed for mild constipation.         Discharge Exam: Filed Weights   05/20/21 1344 05/21/21 1512  Weight: 74.8 kg 82 kg   General exam: awake, alert, no acute distress HEENT: atraumatic, clear conjunctiva, anicteric sclera, moist mucus membranes, hearing grossly normal  Respiratory system: CTAB, no wheezes, rales or rhonchi,  normal respiratory effort. Cardiovascular system: normal S1/S2,  RRR, no JVD, murmurs, rubs, gallops,  no pedal edema.   Gastrointestinal system: soft, NT, ND, no HSM felt, +bowel sounds. Central nervous system: A&O x3. no gross focal neurologic deficits, normal speech Extremities: moves all , no edema, normal tone Skin: dry, intact, normal temperature Psychiatry: normal mood, congruent affect, judgement and insight appear normal   Condition at discharge: stable  The results of significant diagnostics from this hospitalization (including imaging, microbiology, ancillary and laboratory) are listed below for reference.   Imaging Studies: DG Chest 2 View  Result Date: 05/19/2021 CLINICAL DATA:  Shortness of breath. EXAM: CHEST - 2 VIEW COMPARISON:  Chest radiograph dated 05/30/2020. FINDINGS: Left lung base atelectasis/scarring. No focal consolidation, pleural effusion or pneumothorax. Mild cardiomegaly. Atherosclerotic calcification of the aorta. No acute osseous pathology. IMPRESSION: No active cardiopulmonary disease. Electronically Signed   By: Anner Crete M.D.   On: 05/19/2021 21:10   CT Head Wo Contrast  Result Date: 05/19/2021 CLINICAL DATA:  Fall with altered mental status EXAM: CT HEAD WITHOUT CONTRAST CT CERVICAL SPINE WITHOUT CONTRAST TECHNIQUE: Multidetector CT imaging of the head and cervical spine was performed following the standard protocol without intravenous contrast. Multiplanar CT image reconstructions of the cervical spine were also generated. COMPARISON:  CT neck  01/21/2018 FINDINGS: CT HEAD FINDINGS Brain: No acute territorial infarction, hemorrhage or intracranial mass. Advanced atrophy. Extensive white matter hypodensity consistent with chronic small vessel ischemic change. Chronic appearing lacunar infarcts in the basal ganglia and thalamus. Small chronic cerebellar infarcts. Prominent ventricles felt secondary to atrophy Vascular: No hyperdense vessels.  Carotid  vascular calcification Skull: Normal. Negative for fracture or focal lesion. Sinuses/Orbits: Mucosal thickening in the sinuses Other: None CT CERVICAL SPINE FINDINGS Alignment: Mild cervical kyphosis. Trace anterolisthesis C3 on C4 fall and C7 on T1. Trace retrolisthesis C5 on C6. Facet alignment within normal limits. Skull base and vertebrae: No acute fracture. No primary bone lesion or focal pathologic process. Soft tissues and spinal canal: No prevertebral fluid or swelling. No visible canal hematoma. Disc levels: Multilevel degenerative change with advanced disc space narrowing and degenerative change C4 through C7. Facet degenerative changes at multiple levels with foraminal stenosis. Upper chest: Lung apices are clear. Solid mass in the right neck measuring 3.6 by 3.3 cm, previously 3.5 x 2.8 x 3.1 cm, history of biopsy demonstrating schwannoma. Other: None IMPRESSION: 1. No CT evidence for acute intracranial abnormality. Atrophy and chronic small vessel ischemic changes of the white matter. Multiple small chronic appearing infarcts 2. Cervical kyphosis with multilevel degenerative changes. No fracture is seen 3. 3.6 cm solid right supraclavicular mass, this has been previously biopsied. Electronically Signed   By: Donavan Foil M.D.   On: 05/19/2021 20:44   CT Cervical Spine Wo Contrast  Result Date: 05/19/2021 CLINICAL DATA:  Fall with altered mental status EXAM: CT HEAD WITHOUT CONTRAST CT CERVICAL SPINE WITHOUT CONTRAST TECHNIQUE: Multidetector CT imaging of the head and cervical spine was performed following the standard protocol without intravenous contrast. Multiplanar CT image reconstructions of the cervical spine were also generated. COMPARISON:  CT neck 01/21/2018 FINDINGS: CT HEAD FINDINGS Brain: No acute territorial infarction, hemorrhage or intracranial mass. Advanced atrophy. Extensive white matter hypodensity consistent with chronic small vessel ischemic change. Chronic appearing lacunar  infarcts in the basal ganglia and thalamus. Small chronic cerebellar infarcts. Prominent ventricles felt secondary to atrophy Vascular: No hyperdense vessels.  Carotid vascular calcification Skull: Normal. Negative for fracture or focal lesion. Sinuses/Orbits: Mucosal thickening in the sinuses Other: None CT CERVICAL SPINE FINDINGS Alignment: Mild cervical kyphosis. Trace anterolisthesis C3 on C4 fall and C7 on T1. Trace retrolisthesis C5 on C6. Facet alignment within normal limits. Skull base and vertebrae: No acute fracture. No primary bone lesion or focal pathologic process. Soft tissues and spinal canal: No prevertebral fluid or swelling. No visible canal hematoma. Disc levels: Multilevel degenerative change with advanced disc space narrowing and degenerative change C4 through C7. Facet degenerative changes at multiple levels with foraminal stenosis. Upper chest: Lung apices are clear. Solid mass in the right neck measuring 3.6 by 3.3 cm, previously 3.5 x 2.8 x 3.1 cm, history of biopsy demonstrating schwannoma. Other: None IMPRESSION: 1. No CT evidence for acute intracranial abnormality. Atrophy and chronic small vessel ischemic changes of the white matter. Multiple small chronic appearing infarcts 2. Cervical kyphosis with multilevel degenerative changes. No fracture is seen 3. 3.6 cm solid right supraclavicular mass, this has been previously biopsied. Electronically Signed   By: Donavan Foil M.D.   On: 05/19/2021 20:44    Microbiology: Results for orders placed or performed during the hospital encounter of 05/19/21  Resp Panel by RT-PCR (Flu A&B, Covid) Nasopharyngeal Swab     Status: Abnormal   Collection Time: 05/19/21  8:22 PM   Specimen:  Nasopharyngeal Swab; Nasopharyngeal(NP) swabs in vial transport medium  Result Value Ref Range Status   SARS Coronavirus 2 by RT PCR POSITIVE (A) NEGATIVE Final    Comment: (NOTE) SARS-CoV-2 target nucleic acids are DETECTED.  The SARS-CoV-2 RNA is generally  detectable in upper respiratory specimens during the acute phase of infection. Positive results are indicative of the presence of the identified virus, but do not rule out bacterial infection or co-infection with other pathogens not detected by the test. Clinical correlation with patient history and other diagnostic information is necessary to determine patient infection status. The expected result is Negative.  Fact Sheet for Patients: EntrepreneurPulse.com.au  Fact Sheet for Healthcare Providers: IncredibleEmployment.be  This test is not yet approved or cleared by the [place] FDA and  has been authorized for detection and/or diagnosis of SARS-CoV-2 by FDA under an Emergency Use Authorization (EUA).  This EUA will remain in effect (meaning this test can be used) for the duration of  the COVID-19 declaration under Section 564(b)(1) of the A ct, 21 U.S.C. section 360bbb-3(b)(1), unless the authorization is terminated or revoked sooner.     Influenza A by PCR NEGATIVE NEGATIVE Final   Influenza B by PCR NEGATIVE NEGATIVE Final    Comment: (NOTE) The Xpert Xpress SARS-CoV-2/FLU/RSV plus assay is intended as an aid in the diagnosis of influenza from Nasopharyngeal swab specimens and should not be used as a sole basis for treatment. Nasal washings and aspirates are unacceptable for Xpert Xpress SARS-CoV-2/FLU/RSV testing.  Fact Sheet for Patients: EntrepreneurPulse.com.au  Fact Sheet for Healthcare Providers: IncredibleEmployment.be  This test is not yet approved or cleared by the [place] FDA and has been authorized for detection and/or diagnosis of SARS-CoV-2 by FDA under an Emergency Use Authorization (EUA). This EUA will remain in effect (meaning this test can be used) for the duration of the COVID-19 declaration under Section 564(b)(1) of the Act, 21 U.S.C. section 360bbb-3(b)(1), unless the  authorization is terminated or revoked.  Performed at [facility], [address] [place], [place] [address]     Labs: CBC: Recent Labs  Lab 05/19/21 1859 05/20/21 0000 05/20/21 0709 05/21/21 0442 05/22/21 0610  WBC 6.7 6.5 6.4 4.1 5.7  NEUTROABS 4.7  --  4.7 2.9 4.3  HGB 13.4 13.4 13.4 12.9* 13.2  HCT 40.3 40.5 41.3 39.8 40.7  MCV 90.8 91.4 90.6 91.5 92.7  PLT 199 187 183 176 093   Basic Metabolic Panel: Recent Labs  Lab 05/19/21 1859 05/20/21 0000 05/20/21 0709 05/21/21 0442 05/22/21 0610 05/23/21 0717 05/24/21 0539  NA 137  --  137 140 140 139 138  K 3.7  --  3.9 4.6 4.2 3.9 3.9  CL 104  --  102 109 108 105 103  CO2 26  --  26 26 26 27 28   GLUCOSE 94  --  84 114* 106* 102* 76  BUN 17  --  16 17 20 19  24*  CREATININE 1.04   < > 1.01 0.97 0.87 0.83 0.97  CALCIUM 8.9  --  8.4* 8.4* 8.8* 8.7* 9.1  MG 2.0  --  2.0 2.2 2.3 2.2  --   PHOS  --   --  3.4 4.1 2.3* 3.4 2.9   < > = values in this interval not displayed.   Liver Function Tests: Recent Labs  Lab 05/20/21 0709 05/21/21 0442 05/22/21 0610 05/23/21 0717 05/24/21 0539  AST 76* 59* 54* 68* 68*  ALT 28 27 36 75* 89*  ALKPHOS  66 62 57 57 61  BILITOT 0.5 0.5 0.4 0.6 0.5  PROT 6.7 6.3* 6.4* 6.4* 6.6  ALBUMIN 3.5 3.3* 3.2* 3.2* 3.5   CBG: No results for input(s): GLUCAP in the last 168 hours.  Discharge time spent: less than 30 minutes.  Signed: Ezekiel Slocumb, DO [facility] 05/24/2021

## 2021-05-24 NOTE — TOC Initial Note (Addendum)
Transition of Care [facility]) - Initial/Assessment Note    Patient Details  Name: Thomas [name] MRN: [id number] Date of Birth: 21-Feb-1931  [facility] of [facility]) CM/SW Contact:    Magnus Ivan, LCSW Phone Number: 05/24/2021, 10:03 AM  Clinical Narrative:       Patient not fully oriented and on airborne precautions. CSW called patient's daughter Juliann Pulse to discuss DC planning.  Patient lives alone. Family is aware of the recommendation for 24 hour supervision and plan to provide this in patient's home at time of DC. PCP is Dr. Ginette Pitman. [facility] [place]. Patient has a RW, cane, shower seat, and BSC. Family provides transportation to appointments. Juliann Pulse is agreeable to [facility] for additional support. [facility] used [facility]. Corene Cornea with [facility] has accepted [facility] referral. Corene Cornea is aware of plan for DC today per DO.        Expected Discharge Plan: [place] Barriers to Discharge: Continued Medical Work up   Patient Goals and CMS Choice Patient states their goals for this hospitalization and ongoing recovery are:: home with home health CMS Medicare.gov Compare Post Acute Care list provided to:: Patient Represenative (must comment) Choice offered to / list presented to : Adult Children  Expected Discharge Plan and Services Expected Discharge Plan: [place]       Living arrangements for the past 2 months: Single Family Home                           HH Arranged: PT, OT, RN, Nurse's Aide [place] Agency: [place] ([place]) Date [place]: 05/24/21   Representative spoke with at [place]: Corene Cornea  Prior Living Arrangements/Services Living arrangements for the past 2 months: [place] Lives with:: Self Patient language and need for interpreter reviewed:: Yes Do you feel safe going back to the place where you live?: Yes      Need for Family Participation in Patient Care: Yes (Comment) Care giver support  system in place?: Yes (comment)   Criminal Activity/Legal Involvement Pertinent to Current Situation/Hospitalization: No - Comment as needed  Activities of Daily Living Home Assistive Devices/Equipment: Walker (specify type) ADL Screening (condition at time of admission) Patient's cognitive ability adequate to safely complete daily activities?: Yes Is the patient deaf or have difficulty hearing?: No Does the patient have difficulty seeing, even when wearing glasses/contacts?: No Does the patient have difficulty concentrating, remembering, or making decisions?: Yes Patient able to express need for assistance with ADLs?: Yes Does the patient have difficulty dressing or bathing?: No Independently performs ADLs?: No Communication: Independent Dressing (OT): Needs assistance Is this a change from baseline?: Change from baseline, expected to last <3days Grooming: Independent Feeding: Independent Bathing: Needs assistance Is this a change from baseline?: Change from baseline, expected to last <3 days Toileting: Needs assistance Is this a change from baseline?: Change from baseline, expected to last <3 days In/Out Bed: Needs assistance Is this a change from baseline?: Change from baseline, expected to last <3 days Walks in Home: Needs assistance Is this a change from baseline?: Change from baseline, expected to last <3 days Does the patient have difficulty walking or climbing stairs?: Yes Weakness of Legs: Both Weakness of Arms/Hands: None  Permission Sought/Granted Permission sought to share information with : Facility Art therapist granted to share information with : Yes, Verbal Permission Granted (by daughter)     Permission granted to  share info w AGENCY: [place] agencies        Emotional Assessment       Orientation: : Fluctuating Orientation (Suspected and/or reported [place]) Alcohol / Substance Use: Not Applicable Psych Involvement: No  (comment)  Admission diagnosis:  Encephalopathy [G93.40] Traumatic rhabdomyolysis, initial encounter ([facility] [place]) [T79.6XXA] AMS (altered mental status) [R41.82] COVID-19 virus infection [U07.1] COVID-19 [U07.1] Patient Active Problem List   Diagnosis Date Noted   Hypophosphatemia 05/22/2021   Traumatic rhabdomyolysis ([place])    Neuropathy    Rhabdomyolysis due to COVID-19 05/19/2021   Weakness    Acute respiratory failure with hypoxia (HCC)    Insomnia    Acute metabolic encephalopathy    COVID-19 virus infection 05/28/2020   GERD (gastroesophageal reflux disease)    Hypertension    HLD (hyperlipidemia)    Multiple lung nodules 12/05/2018   Mass of right side of neck 01/27/2018   PCP:  Tracie Harrier, MD Pharmacy:   [id number] [place] [address] [place] [address] Phone: [phone] Fax: [phone]     Social Determinants of Health (SDOH) Interventions    Readmission Risk Interventions No flowsheet data found.

## 2021-05-24 NOTE — Evaluation (Signed)
Occupational Therapy Evaluation Patient Details Name: Thomas [name] MRN: [id number] DOB: 1931-03-16 Today's Date: 05/24/2021   History of Present Illness 88 y.o. male who came to ED after being found on the floor by daughter.  He has some confusion and generalized weakness, found to have COVID. Pt had similar episode about a year ago.   Clinical Impression   Pt seen for OT evaluation this date in setting of acute hospitalization d/t COVID. Pt presents with some mild confusion, decreased safety awareness, some balance deficits and decreased fxl activity tolerance. He reports and family confirms that he lives alone at baseline and drives. On OT assessment, pt requiring:  SETUP for seated UB ADLs, CGA/SUPV for ADL Transfers with RW, RW for fxl mobility with CGA/SUPV. increased processing time and cues for safety throughout. He is somewhat impulsive, but pleasant and participatory throughout. Family is agreeable to increasing supervision at home for at least a 1-2 week period upon d/c for safety and OT recommends BSC to prevent falls at night. Recommend HHOT f/u as well to improve balance and safety for self care in the natural environment and address cognition as it relates to ADL/IADL safety. Pt left seated in recliner with all needs met and in reach at end of session. Will continue to follow acutely.    Recommendations for follow up therapy are one component of a multi-disciplinary discharge planning process, led by the attending physician.  Recommendations may be updated based on patient status, additional functional criteria and insurance authorization.   Follow Up Recommendations  Home health OT    Assistance Recommended at Discharge Frequent or constant Supervision/Assistance  Patient can return home with the following A little help with bathing/dressing/bathroom;Assistance with cooking/housework;Direct supervision/assist for medications management;Direct supervision/assist for financial  management;Assist for transportation;Help with stairs or ramp for entrance    Functional Status Assessment  Patient has had a recent decline in their functional status and demonstrates the ability to make significant improvements in function in a reasonable and predictable amount of time.  Equipment Recommendations  BSC/3in1    Recommendations for Other Services       Precautions / Restrictions Precautions Precautions: Fall Restrictions Weight Bearing Restrictions: No      Mobility Bed Mobility               General bed mobility comments: in recliner pre/post    Transfers Overall transfer level: Needs assistance Equipment used: Rolling walker (2 wheels) Transfers: Sit to/from Stand Sit to Stand: Supervision;Min guard           General transfer comment: CGA intiital stand with progress to SUPV.      Balance Overall balance assessment: Needs assistance Sitting-balance support: No upper extremity supported;Feet supported Sitting balance-[name]: Good Sitting balance - Comments: hunched posture, poor positional awareness   Standing balance support: During functional activity;Reliant on assistive device for balance Standing balance-[name]: Good Standing balance comment: requires UE support for safe fxl mobility ~40' on the unit, able to alteranate hands frm RW support for static standing tasks such as wiping.                           ADL either performed or assessed with clinical judgement   ADL  General ADL Comments: SETUP for seated UB ADLs, CGA/SUPV for ADL Transfers with RW, RW for fxl mobility with CGA/SUPV. increased processing time and cues for safety throughout.     Vision Patient Visual Report: No change from baseline       Perception     Praxis      Pertinent Vitals/Pain Pain Assessment: No/denies pain     Hand Dominance     Extremity/Trunk Assessment Upper  Extremity Assessment Upper Extremity Assessment: Generalized weakness   Lower Extremity Assessment Lower Extremity Assessment: Generalized weakness       Communication Communication Communication: No difficulties   Cognition Arousal/Alertness: Awake/alert Behavior During Therapy: WFL for tasks assessed/performed Overall Cognitive Status: Impaired/Different from baseline Area of Impairment: Orientation;Following commands;Safety/judgement;Awareness;Problem solving;Memory                 Orientation Level: Disoriented to;Situation     Following Commands: Follows one step commands consistently;Follows one step commands with increased time Safety/Judgement: Decreased awareness of safety   Problem Solving: Slow processing;Requires verbal cues;Requires tactile cues General Comments: Pt is slightly disoriented to situation and slightly impulsive. He requires cues for safe use of RW. He is pleasant and particiaptory, but somewhat decreased insight into deficits.     General Comments       Exercises Other Exercises Other Exercises: OT ed with pt/family re: increasing home supervision until pt is back at cognitive baseline and also considering providing all transportation at this time for safety. Family agreeable. RN/MD updated.   Shoulder Instructions      Home Living Family/patient expects to be discharged to:: Private residence Living Arrangements: Alone Available Help at Discharge: Family;Available PRN/intermittently (daughters and son-in-law check on him with some consistency but not daily) Type of Home: House Home Access: Stairs to enter [employer] of Steps: 2 Entrance Stairs-Rails: Right;Left                 Home Equipment: Conservation officer, nature (2 wheels);Cane - single point   Additional Comments: Pt with continued confusion, unable to answer some (even repeated) questions meaningfully      Prior Functioning/Environment Prior Level of Function :  Independent/Modified Independent             Mobility Comments: Pt reports that he typically gets around quite well with his walker, son-in-law helps with "what ever I need"          OT Problem List: Decreased strength;Decreased activity tolerance;Impaired balance (sitting and/or standing);Decreased safety awareness;Decreased knowledge of use of DME or AE      OT Treatment/Interventions: Therapeutic exercise;Self-care/ADL training;Therapeutic activities;DME and/or AE instruction;Patient/family education    OT Goals(Current goals can be found in the care plan section) Acute Rehab OT Goals Patient Stated Goal: to go home OT Goal Formulation: With patient/family Time For Goal Achievement: 06/07/21 Potential to Achieve Goals: Good ADL Goals Pt Will Perform Lower Body Dressing: with supervision Pt Will Transfer to Toilet: with supervision Pt Will Perform Toileting - Clothing Manipulation and hygiene: with supervision  OT Frequency: Min 2X/week    Co-evaluation              AM-PAC OT "6 Clicks" Daily Activity     Outcome Measure Help from another person eating meals?: None Help from another person taking care of personal grooming?: A Little Help from another person toileting, which includes using toliet, bedpan, or urinal?: A Little Help from another person bathing (including washing, rinsing, drying)?: A Little Help from another person to put on and  taking off regular upper body clothing?: None Help from another person to put on and taking off regular lower body clothing?: A Little 6 Click Score: 20   End of Session Equipment Utilized During Treatment: Gait belt;Rolling walker (2 wheels) Nurse Communication: Mobility status  Activity Tolerance: Patient tolerated treatment well Patient left: with call bell/phone within reach;in chair  OT Visit Diagnosis: Unsteadiness on feet (R26.81);Muscle weakness (generalized) (M62.81)                Time: 7276-1848 OT Time  Calculation (min): 31 min Charges:  OT General Charges $OT Visit: 1 Visit OT Evaluation $OT Eval Moderate Complexity: 1 Mod OT Treatments $Self Care/Home Management : 8-22 mins  Gerrianne Scale, MS, OTR/L ascom [phone] 05/24/21, 3:24 PM

## 2021-10-29 ENCOUNTER — Other Ambulatory Visit: Payer: Self-pay | Admitting: Internal Medicine

## 2021-10-29 DIAGNOSIS — I714 Abdominal aortic aneurysm, without rupture, unspecified: Secondary | ICD-10-CM

## 2021-11-05 ENCOUNTER — Ambulatory Visit
Admission: RE | Admit: 2021-11-05 | Discharge: 2021-11-05 | Disposition: A | Discharge status: Home / Self Care | Payer: Medicare (Managed Care) | Source: Ambulatory Visit | Attending: Internal Medicine | Admitting: Internal Medicine

## 2021-11-05 DIAGNOSIS — I714 Abdominal aortic aneurysm, without rupture, unspecified: Secondary | ICD-10-CM | POA: Insufficient documentation

## 2022-06-21 IMAGING — CT CT CERVICAL SPINE W/O CM
3 of 4 series · 12 of 33 positions shown, 14 images · non-contrast
Comparison: CT neck 01/21/2018

CLINICAL DATA: Fall with altered mental status

EXAM:
CT HEAD WITHOUT CONTRAST
CT CERVICAL SPINE WITHOUT CONTRAST
TECHNIQUE: Multidetector CT imaging of the head and cervical spine was
performed following the standard protocol without intravenous
contrast. Multiplanar CT image reconstructions of the cervical spine
were also generated.

[Series 6: sagittal bone · sagittal · 0.28mm/px · 5 of 85 slices shown, 6 images]
[im 29/85  bone]
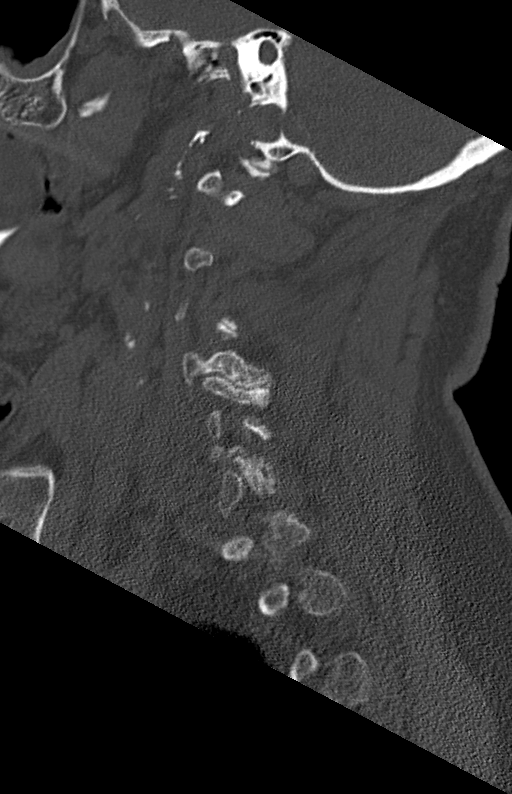
[im 36/85  bone]
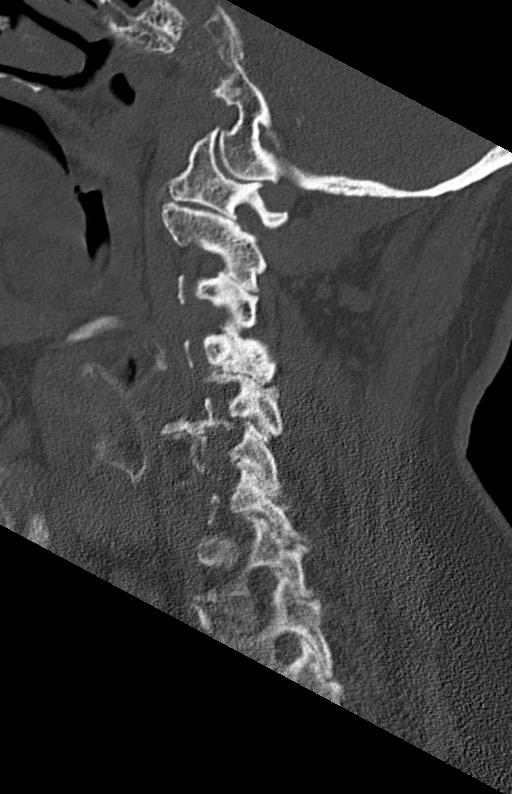
[im 43/85  soft-tissue]
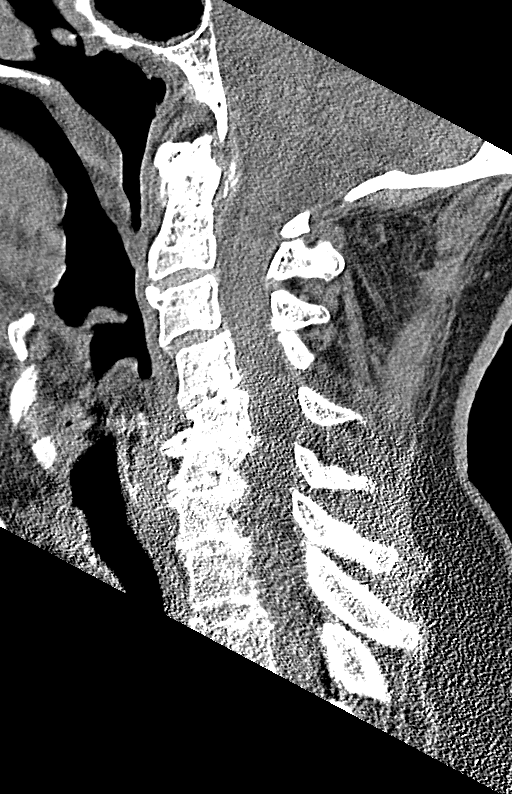
[im 43/85  bone]
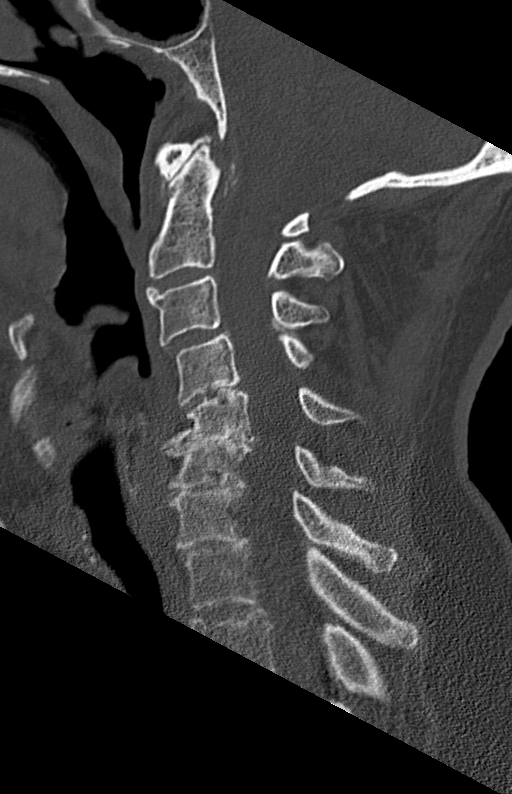
[im 50/85  bone]
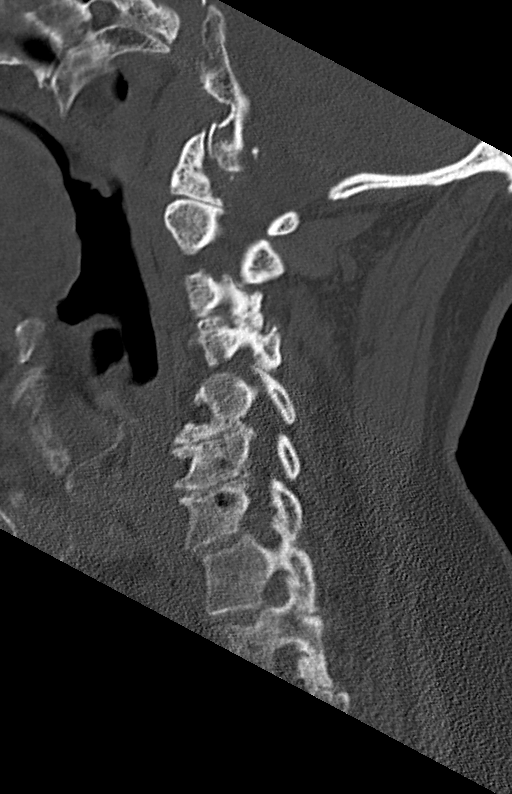
[im 57/85  bone]
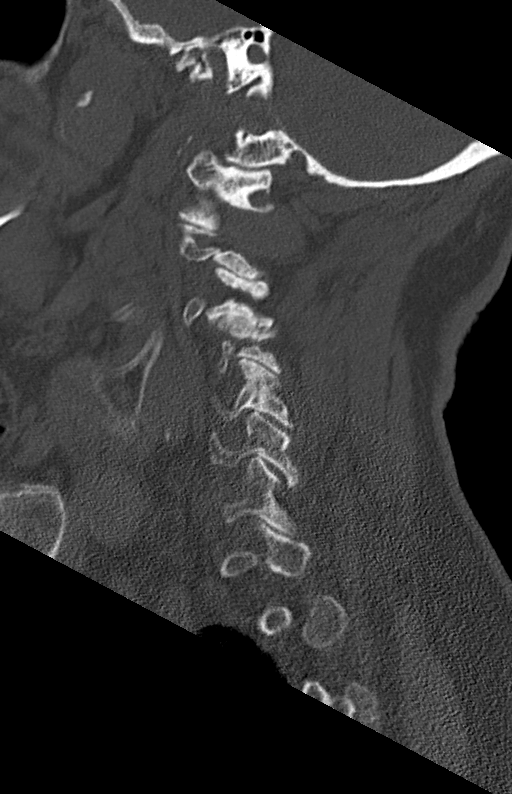

[Series 7: orthogonal bone · axial · 0.30mm/px · z∈[-372,-230]mm · 4 of 117 slices shown, 5 images]
[im 17/117  soft-tissue]
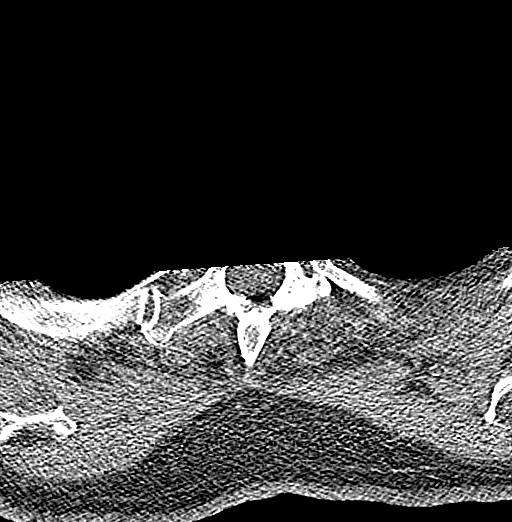
[im 17/117  bone]
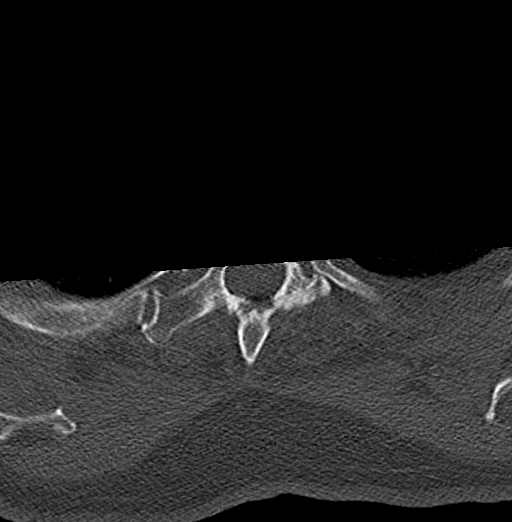
[im 50/117  bone]
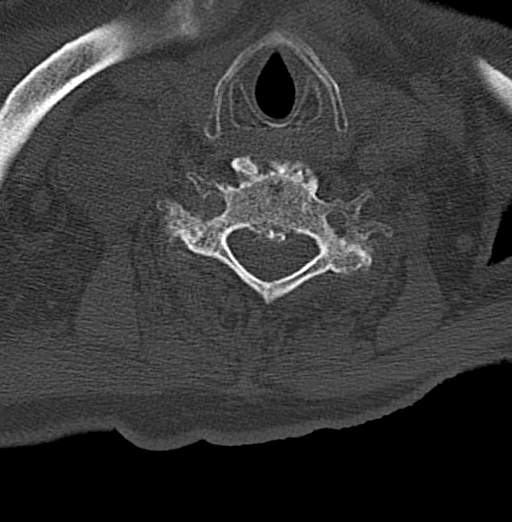
[im 67/117  bone]
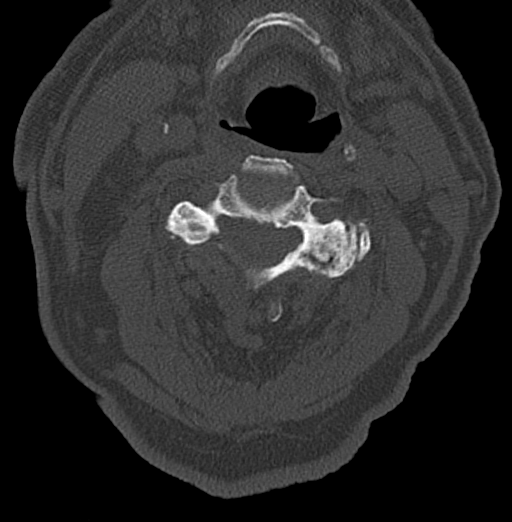
[im 100/117  bone]
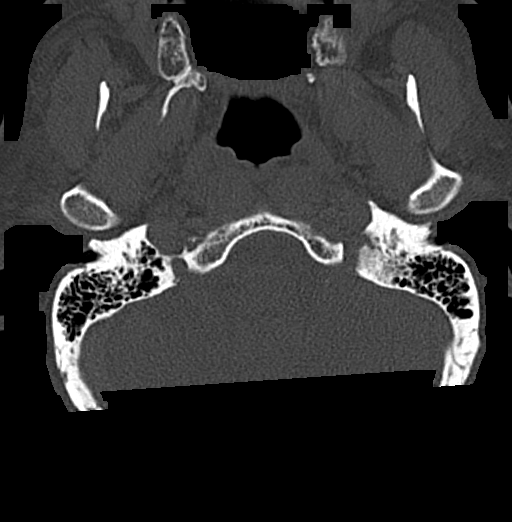

[Series 8: coronal bone · coronal · 0.31mm/px · 3 of 70 slices shown]
[im 19/70  bone]
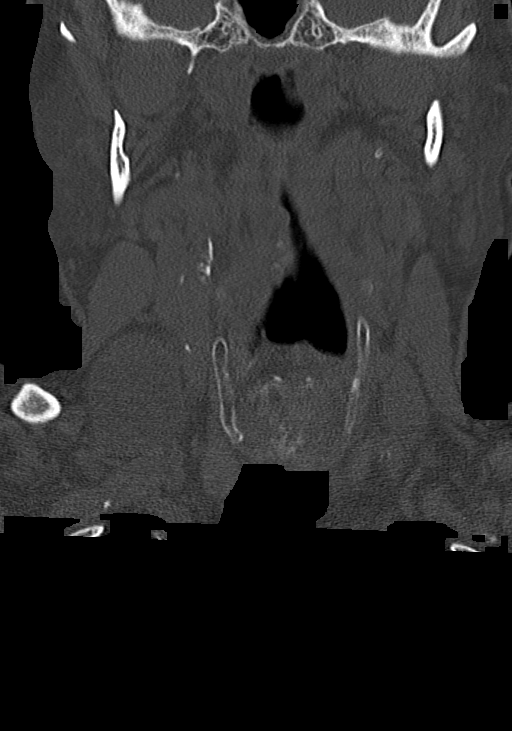
[im 30/70  bone]
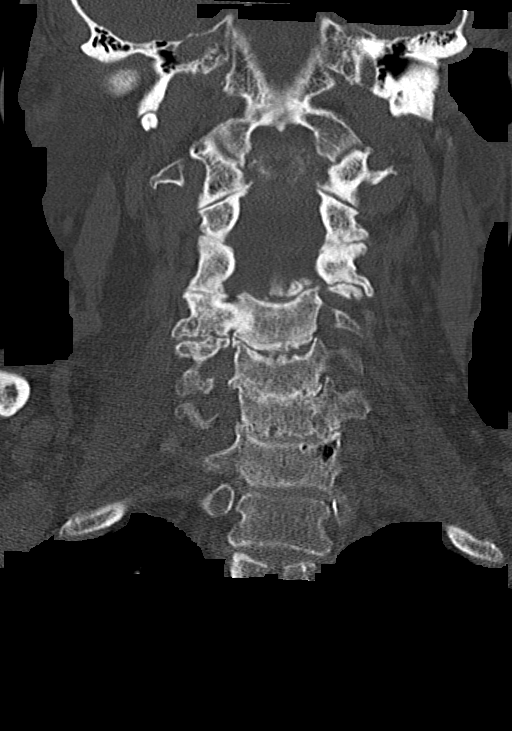
[im 41/70  bone]
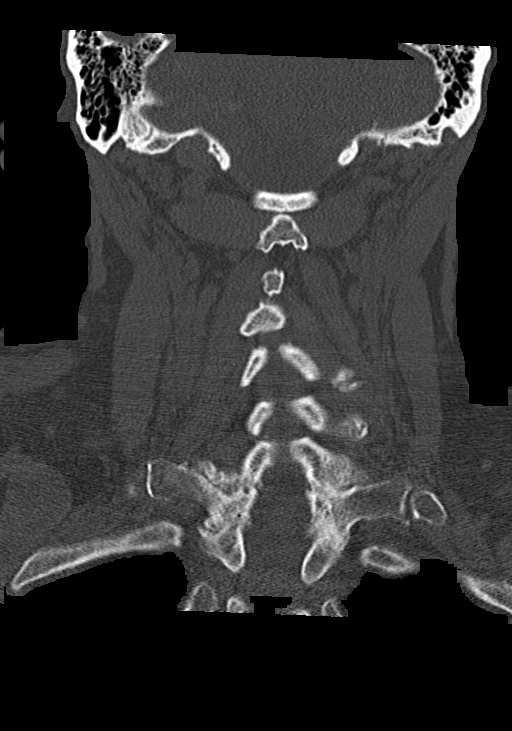

[12 of 33 positions shown; findings below may reference images not displayed]

FINDINGS: CT HEAD FINDINGS

Brain: No acute territorial infarction, hemorrhage or intracranial
mass. Advanced atrophy. Extensive white matter hypodensity
consistent with chronic small vessel ischemic change. Chronic
appearing lacunar infarcts in the basal ganglia and thalamus. Small
chronic cerebellar infarcts. Prominent ventricles felt secondary to
atrophy

Vascular: No hyperdense vessels.  Carotid vascular calcification

Skull: Normal. Negative for fracture or focal lesion.

Sinuses/Orbits: Mucosal thickening in the sinuses

Other: None

CT CERVICAL SPINE FINDINGS

Alignment: Mild cervical kyphosis. Trace anterolisthesis C3 on C4
fall and C7 on T1. Trace retrolisthesis C5 on C6. Facet alignment
within normal limits.

Skull base and vertebrae: No acute fracture. No primary bone lesion
or focal pathologic process.

Soft tissues and spinal canal: No prevertebral fluid or swelling. No
visible canal hematoma.

Disc levels: Multilevel degenerative change with advanced disc space
narrowing and degenerative change C4 through C7. Facet degenerative
changes at multiple levels with foraminal stenosis.

Upper chest: Lung apices are clear. Solid mass in the right neck
measuring 3.6 by 3.3 cm, previously 3.5 x 2.8 x 3.1 cm, history of
biopsy demonstrating schwannoma.

Other: None
IMPRESSION: 1. No CT evidence for acute intracranial abnormality. Atrophy and
chronic small vessel ischemic changes of the white matter. Multiple
small chronic appearing infarcts
2. Cervical kyphosis with multilevel degenerative changes. No
fracture is seen
3. 3.6 cm solid right supraclavicular mass, this has been previously
biopsied.

## 2022-06-21 IMAGING — CT CT HEAD W/O CM
4 series · 15 of 47 positions shown, 17 images · non-contrast
Comparison: CT neck 01/21/2018

CLINICAL DATA: Fall with altered mental status

EXAM:
CT HEAD WITHOUT CONTRAST
CT CERVICAL SPINE WITHOUT CONTRAST
TECHNIQUE: Multidetector CT imaging of the head and cervical spine was
performed following the standard protocol without intravenous
contrast. Multiplanar CT image reconstructions of the cervical spine
were also generated.

[Series 2: head wo · axial · 0.43mm/px · z∈[-178,-58]mm · 7 of 33 slices shown, 9 images]
[im 5/33  brain]
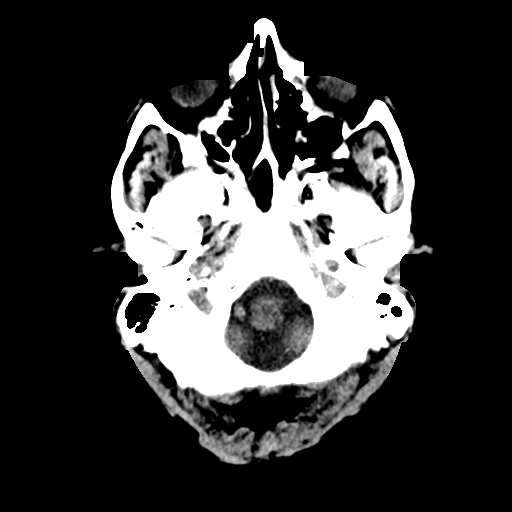
[im 5/33  bone]
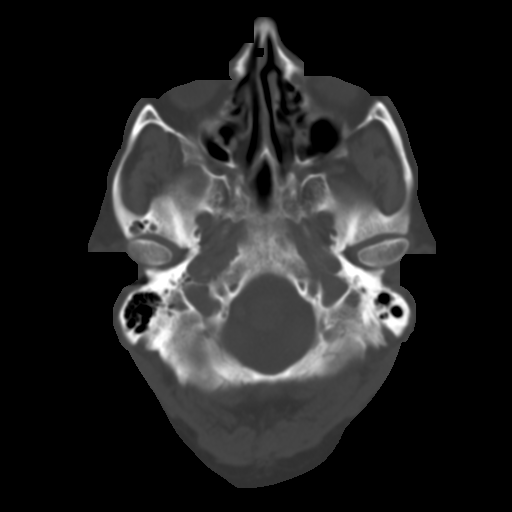
[im 9/33  brain]
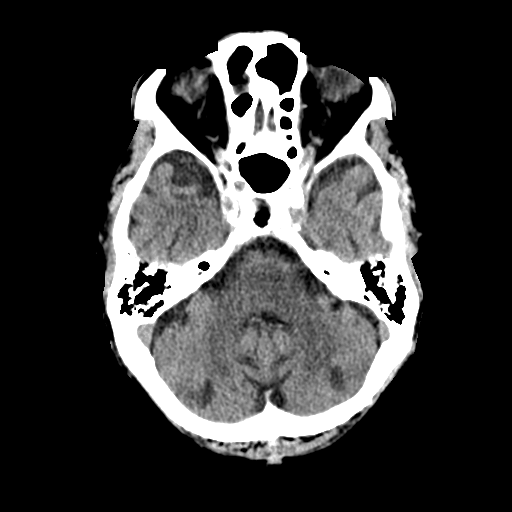
[im 13/33  brain]
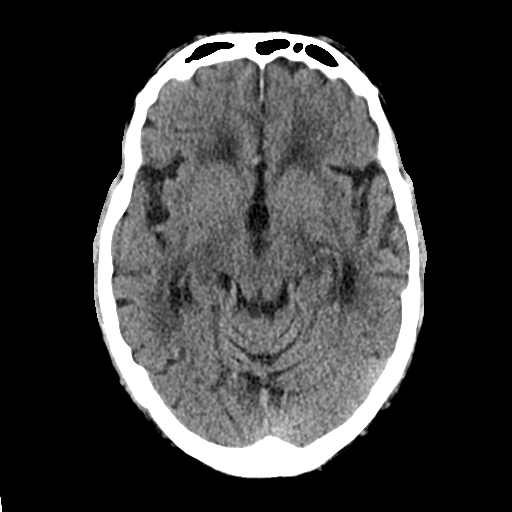
[im 17/33  brain]
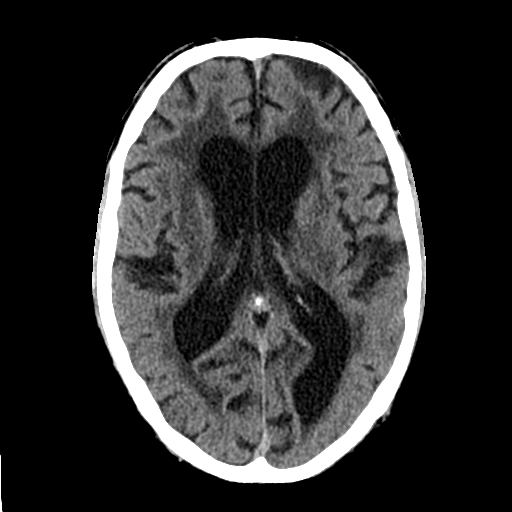
[im 21/33  brain]
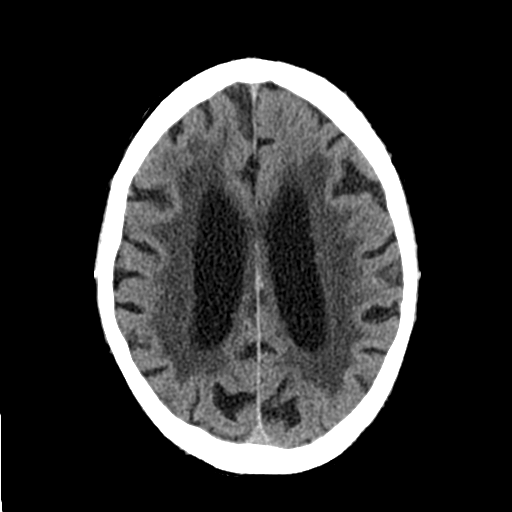
[im 21/33  bone]
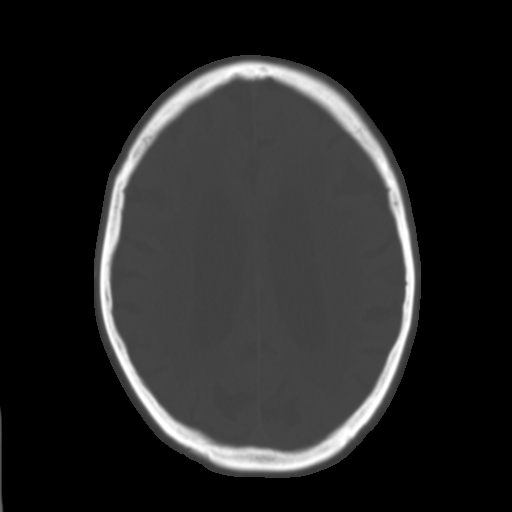
[im 25/33  brain]
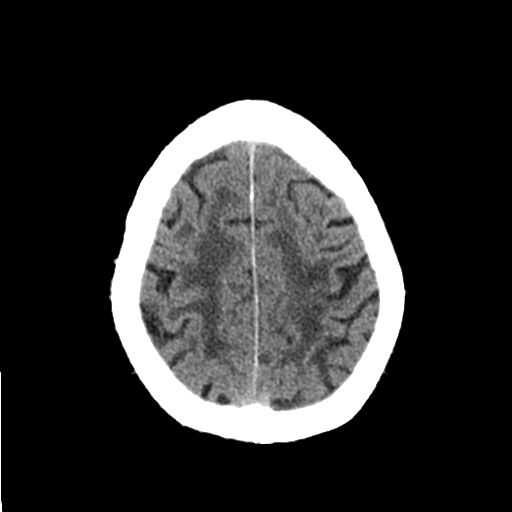
[im 29/33  brain]
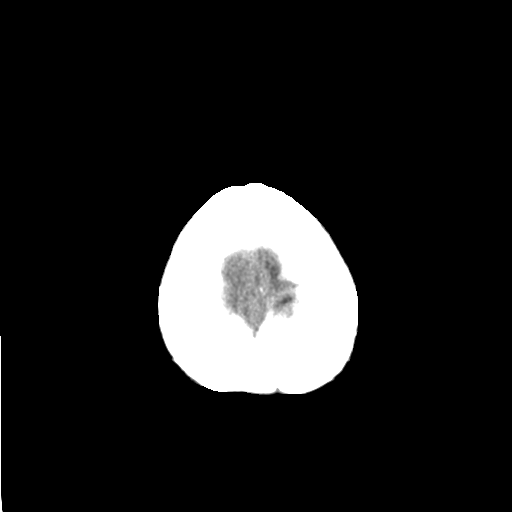

[Series 3: head bone · axial · 0.43mm/px · z∈[-182,-166]mm · 2 of 82 slices shown]
[im 9/82  bone]
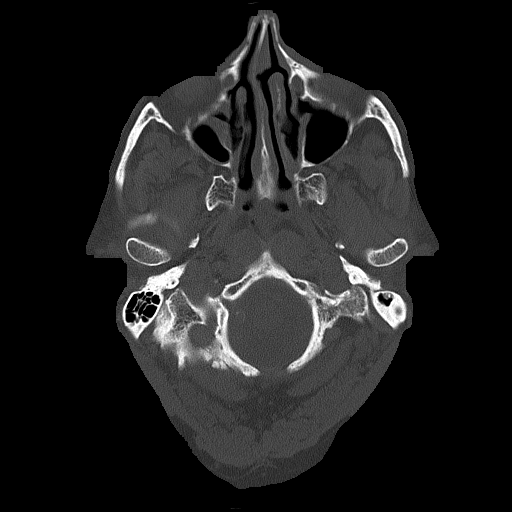
[im 17/82  bone]
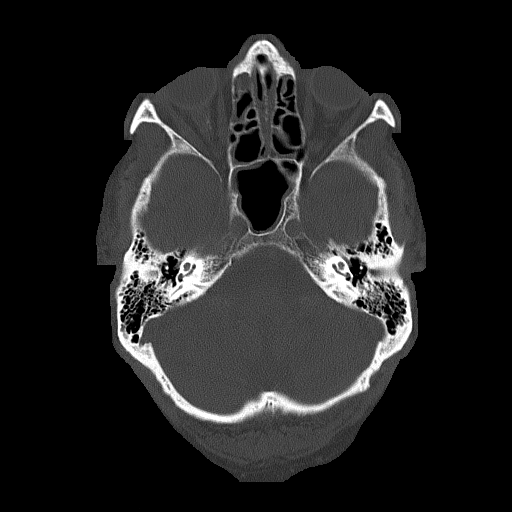

[Series 4: coronal soft tissue · coronal · 0.34mm/px · 3 of 74 slices shown]
[im 25/74  brain]
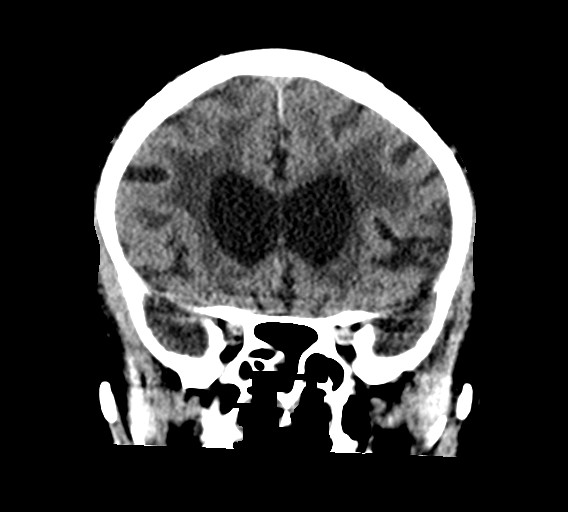
[im 33/74  brain]
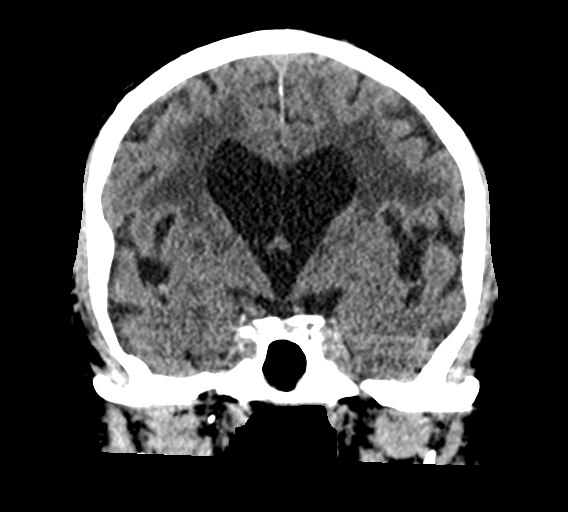
[im 41/74  brain]
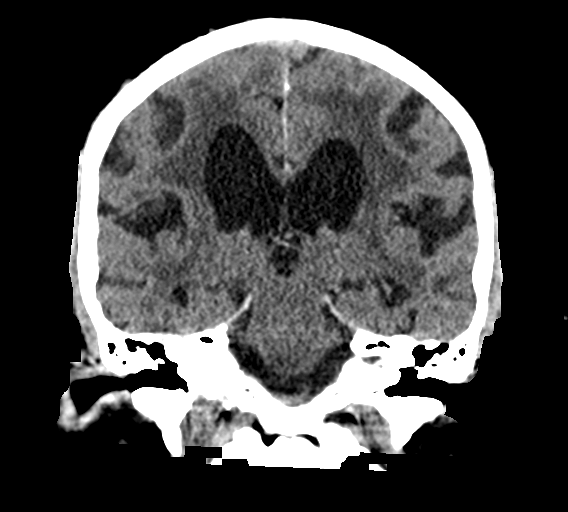

[Series 5: sagittal soft tissue · sagittal · 0.34mm/px · 3 of 55 slices shown]
[im 19/55  brain]
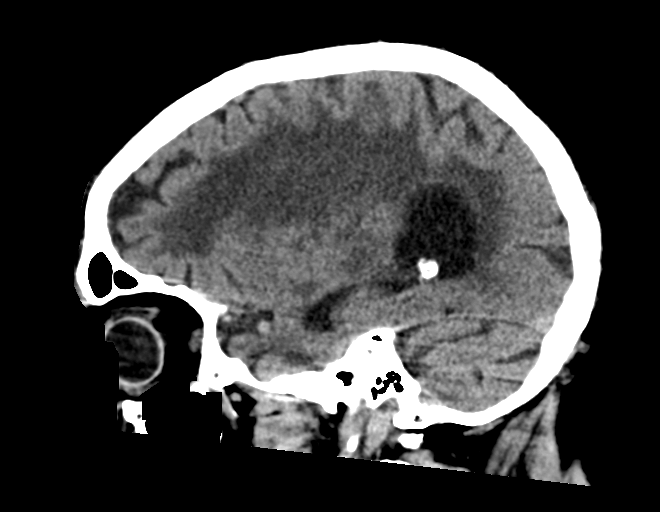
[im 28/55  brain]
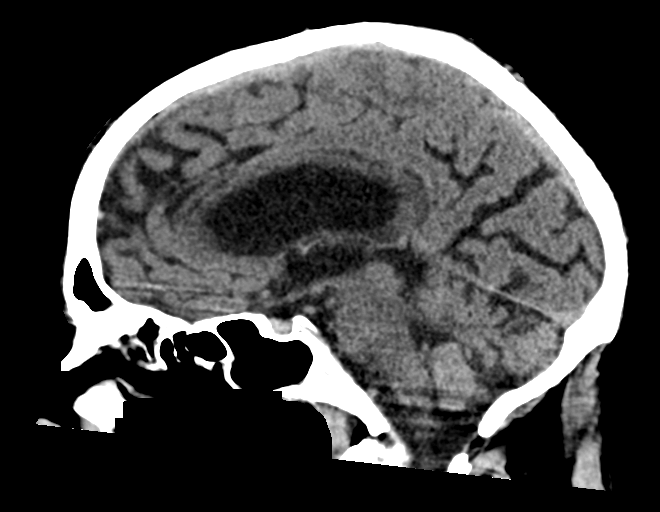
[im 37/55  brain]
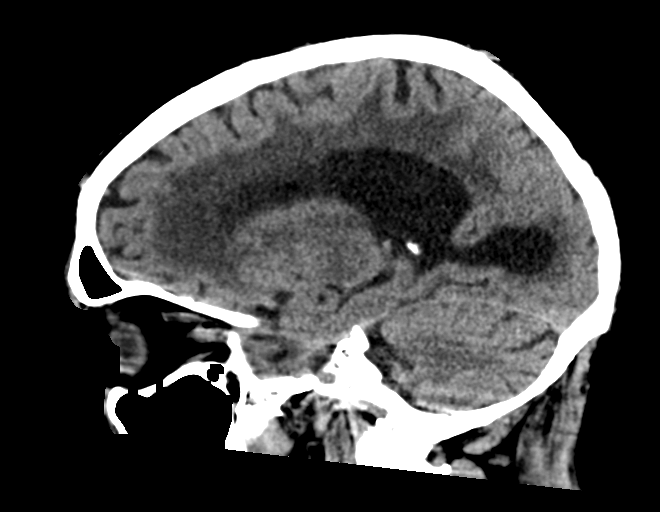

[15 of 47 positions shown; findings below may reference images not displayed]

FINDINGS: CT HEAD FINDINGS

Brain: No acute territorial infarction, hemorrhage or intracranial
mass. Advanced atrophy. Extensive white matter hypodensity
consistent with chronic small vessel ischemic change. Chronic
appearing lacunar infarcts in the basal ganglia and thalamus. Small
chronic cerebellar infarcts. Prominent ventricles felt secondary to
atrophy

Vascular: No hyperdense vessels.  Carotid vascular calcification

Skull: Normal. Negative for fracture or focal lesion.

Sinuses/Orbits: Mucosal thickening in the sinuses

Other: None

CT CERVICAL SPINE FINDINGS

Alignment: Mild cervical kyphosis. Trace anterolisthesis C3 on C4
fall and C7 on T1. Trace retrolisthesis C5 on C6. Facet alignment
within normal limits.

Skull base and vertebrae: No acute fracture. No primary bone lesion
or focal pathologic process.

Soft tissues and spinal canal: No prevertebral fluid or swelling. No
visible canal hematoma.

Disc levels: Multilevel degenerative change with advanced disc space
narrowing and degenerative change C4 through C7. Facet degenerative
changes at multiple levels with foraminal stenosis.

Upper chest: Lung apices are clear. Solid mass in the right neck
measuring 3.6 by 3.3 cm, previously 3.5 x 2.8 x 3.1 cm, history of
biopsy demonstrating schwannoma.

Other: None
IMPRESSION: 1. No CT evidence for acute intracranial abnormality. Atrophy and
chronic small vessel ischemic changes of the white matter. Multiple
small chronic appearing infarcts
2. Cervical kyphosis with multilevel degenerative changes. No
fracture is seen
3. 3.6 cm solid right supraclavicular mass, this has been previously
biopsied.

## 2023-01-25 ENCOUNTER — Encounter: Payer: Self-pay | Admitting: *Deleted

## 2023-01-25 ENCOUNTER — Emergency Department: Payer: Medicare (Managed Care)

## 2023-01-25 ENCOUNTER — Other Ambulatory Visit: Payer: Self-pay

## 2023-01-25 ENCOUNTER — Emergency Department
Admission: EM | Admit: 2023-01-25 | Discharge: 2023-01-25 | Disposition: A | Discharge status: Home / Self Care | Payer: Medicare (Managed Care) | Attending: Emergency Medicine | Admitting: Emergency Medicine

## 2023-01-25 DIAGNOSIS — R0602 Shortness of breath: Secondary | ICD-10-CM | POA: Diagnosis not present

## 2023-01-25 DIAGNOSIS — R350 Frequency of micturition: Secondary | ICD-10-CM | POA: Insufficient documentation

## 2023-01-25 DIAGNOSIS — I509 Heart failure, unspecified: Secondary | ICD-10-CM | POA: Insufficient documentation

## 2023-01-25 DIAGNOSIS — Z20822 Contact with and (suspected) exposure to covid-19: Secondary | ICD-10-CM | POA: Insufficient documentation

## 2023-01-25 DIAGNOSIS — R410 Disorientation, unspecified: Secondary | ICD-10-CM | POA: Diagnosis not present

## 2023-01-25 DIAGNOSIS — I110 Hypertensive heart disease with heart failure: Secondary | ICD-10-CM | POA: Insufficient documentation

## 2023-01-25 LAB — URINALYSIS, ROUTINE W REFLEX MICROSCOPIC
Bacteria, UA: NONE SEEN
Bilirubin Urine: NEGATIVE
Glucose, UA: NEGATIVE mg/dL
Hgb urine dipstick: NEGATIVE
Ketones, ur: NEGATIVE mg/dL
Leukocytes,Ua: NEGATIVE
Nitrite: NEGATIVE
Protein, ur: 30 mg/dL — AB
Specific Gravity, Urine: 1.025 (ref 1.005–1.030)
pH: 5 (ref 5.0–8.0)

## 2023-01-25 LAB — CBC
HCT: 44.3 % (ref 39.0–52.0)
Hemoglobin: 14.5 g/dL (ref 13.0–17.0)
MCH: 30.3 pg (ref 26.0–34.0)
MCHC: 32.7 g/dL (ref 30.0–36.0)
MCV: 92.7 fL (ref 80.0–100.0)
Platelets: 255 10*3/uL (ref 150–400)
RBC: 4.78 MIL/uL (ref 4.22–5.81)
RDW: 13.4 % (ref 11.5–15.5)
WBC: 6 10*3/uL (ref 4.0–10.5)
nRBC: 0 % (ref 0.0–0.2)

## 2023-01-25 LAB — BASIC METABOLIC PANEL
Anion gap: 11 (ref 5–15)
BUN: 15 mg/dL (ref 8–23)
CO2: 24 mmol/L (ref 22–32)
Calcium: 9.5 mg/dL (ref 8.9–10.3)
Chloride: 104 mmol/L (ref 98–111)
Creatinine, Ser: 1.22 mg/dL (ref 0.61–1.24)
GFR, Estimated: 57 mL/min — ABNORMAL LOW (ref >60)
Glucose, Bld: 129 mg/dL — ABNORMAL HIGH (ref 70–99)
Potassium: 3.6 mmol/L (ref 3.5–5.1)
Sodium: 139 mmol/L (ref 135–145)

## 2023-01-25 LAB — RESP PANEL BY RT-PCR (RSV, FLU A&B, COVID)  RVPGX2
Influenza A by PCR: NEGATIVE
Influenza B by PCR: NEGATIVE
Resp Syncytial Virus by PCR: NEGATIVE
SARS Coronavirus 2 by RT PCR: NEGATIVE

## 2023-01-25 LAB — BRAIN NATRIURETIC PEPTIDE: B Natriuretic Peptide: 117.9 pg/mL — ABNORMAL HIGH (ref 0.0–100.0)

## 2023-01-25 LAB — TROPONIN I (HIGH SENSITIVITY): Troponin I (High Sensitivity): 40 ng/L — ABNORMAL HIGH (ref <18)

## 2023-01-25 MED ORDER — FUROSEMIDE 20 MG PO TABS: 20.0000 mg | ORAL_TABLET | Freq: Every day | ORAL | 0 refills | Status: DC

## 2023-01-25 MED ORDER — SODIUM CHLORIDE 0.9 % IV BOLUS
1000.0000 mL | Freq: Once | INTRAVENOUS | Status: AC
  Administered 2023-01-25: 1000 mL via INTRAVENOUS

## 2023-01-25 NOTE — Discharge Instructions (Addendum)
Please take your fluid pills as prescribed for the next 5 days.  Return to the ER for any trouble breathing, chest pain, or any symptom personally concerning to yourself.  Otherwise please follow up with your doctor in 2-3 days for recheck.

## 2023-01-25 NOTE — ED Provider Notes (Signed)
[facility] Provider Note    Event Date/Time   First MD Initiated Contact with Patient 01/25/23 1720     (approximate)  History   Chief Complaint: Urinary Frequency  HPI  Thomas [name] is a 89 y.o. male with a past medical history of arthritis, gastric reflux, hypertension, presents to the emergency department for increased urination/urinary frequency over the past several days.  Son states the urine is also been very dark in color.  Son states at times he feels like the patient is confused however he is alert and oriented currently.  Patient denies any symptoms at this time.  Son denies any recent nausea vomiting or diarrhea denies any recent fever although states last week he had a bad cold with a lot of congestion and cough that has seemed to resolve.  States they took a home COVID test that was negative.  Patient denies any abdominal pain or chest pain.  No shortness of breath.  Physical Exam   Triage Vital Signs: ED Triage Vitals  Encounter Vitals Group     BP 01/25/23 1353 135/62     Systolic BP Percentile --      Diastolic BP Percentile --      Pulse Rate 01/25/23 1353 (!) 47     Resp 01/25/23 1353 18     Temp 01/25/23 1353 97.6 F (36.4 C)     Temp src --      SpO2 01/25/23 1353 96 %     Weight --      Height --      Head Circumference --      Peak Flow --      Pain Score 01/25/23 1354 0     Pain Loc --      Pain Education --      Exclude from Growth Chart --     Most recent vital signs: Vitals:   01/25/23 1353  BP: 135/62  Pulse: (!) 47  Resp: 18  Temp: 97.6 F (36.4 C)  SpO2: 96%    General: Awake, no distress.  CV:  Good peripheral perfusion.  Regular rate and rhythm  Resp:  Normal effort.  Equal breath sounds bilaterally.  Abd:  No distention.  Soft, nontender.  No rebound or guarding.  ED Results / Procedures / Treatments   EKG  EKG viewed and interpreted by myself shows a sinus rhythm at 97 bpm with a narrow QRS, normal  axis, normal intervals, nonspecific ST changes frequent PVCs/bigeminy.  RADIOLOGY  I have reviewed and interpreted chest x-ray images.  Mildly enlarged heart but no consolidation. Chest x-ray read as possible mild interstitial edema. CT scan head shows no acute finding   MEDICATIONS ORDERED IN ED: Medications  sodium chloride 0.9 % bolus 1,000 mL (1,000 mLs Intravenous New Bag/Given 01/25/23 1815)     IMPRESSION / MDM / ASSESSMENT AND PLAN / ED COURSE  I reviewed the triage vital signs and the nursing notes.  Patient's presentation is most consistent with acute presentation with potential threat to life or bodily function.  Patient presents to the emergency department with urinary frequency as well as very dark urine per son concerning for dehydration or an infection they have also noticed some mild confusion but states this has been an ongoing issue for many weeks.  He does state the patient had a very bad cold with upper respiratory congestion and cough last week but that has since cleared and the home COVID test was negative.  We will IV hydrate given the possibility of dehydration we will check labs chest x-ray given the confusion will also obtain a COVID swab and a CT scan of the head although patient is not currently confused during my evaluation.  Patient's lab work has resulted overall reassuring chemistry shows no significant finding, reassuring creatinine, COVID and flu are negative, BNP is mildly elevated troponin slightly elevated although unchanged from historical values.  Patient's urinalysis shows no signs of any urinary tract infection or significant dehydration.  Patient's chest x-ray does show mild interstitial edema.  Patient did receive fluids in the emergency department but continues to sat 95 to 96% on room air throughout my evaluation denies any shortness of breath.  CT scan head is negative.  However given the chest x-ray findings we will discharge on 5 days of Lasix and  have the patient follow-up with his doctor.  There are no other concerning findings on his exam imaging or lab work.  Son agreeable to plan of care to follow-up with PCP.  FINAL CLINICAL IMPRESSION(S) / ED DIAGNOSES   CHF exacerbation     Note:  This document was prepared using Dragon voice recognition software and may include unintentional dictation errors.   Minna Antis, MD 01/25/23 2253

## 2023-01-25 NOTE — ED Triage Notes (Signed)
Pt has been having urinary frequency for over a week, son in law also notes that his urine has been dark and malodorous.  Pt has been a little confused in the am but he is alert and oriented at this time.  No focal weakness.  Pt has been more fatigued and sleeping a lot.  Pt had a fall about a week ago, bruise on left buttock.

## 2023-02-12 ENCOUNTER — Encounter: Payer: Self-pay | Admitting: Internal Medicine

## 2023-02-12 ENCOUNTER — Observation Stay: Payer: Medicare (Managed Care)

## 2023-02-12 ENCOUNTER — Inpatient Hospital Stay
Admission: EM | Admit: 2023-02-12 | Discharge: 2023-02-19 | DRG: 065 | Disposition: A | Discharge status: Rehabilitation Facility | Payer: Medicare (Managed Care) | Attending: Student | Admitting: Student

## 2023-02-12 ENCOUNTER — Other Ambulatory Visit: Payer: Self-pay

## 2023-02-12 ENCOUNTER — Emergency Department: Payer: Medicare (Managed Care)

## 2023-02-12 DIAGNOSIS — Z96653 Presence of artificial knee joint, bilateral: Secondary | ICD-10-CM | POA: Diagnosis present

## 2023-02-12 DIAGNOSIS — M6282 Rhabdomyolysis: Secondary | ICD-10-CM | POA: Diagnosis not present

## 2023-02-12 DIAGNOSIS — I63231 Cerebral infarction due to unspecified occlusion or stenosis of right carotid arteries: Principal | ICD-10-CM | POA: Diagnosis present

## 2023-02-12 DIAGNOSIS — R7989 Other specified abnormal findings of blood chemistry: Secondary | ICD-10-CM | POA: Diagnosis present

## 2023-02-12 DIAGNOSIS — R531 Weakness: Principal | ICD-10-CM | POA: Diagnosis present

## 2023-02-12 DIAGNOSIS — S80212A Abrasion, left knee, initial encounter: Secondary | ICD-10-CM | POA: Diagnosis present

## 2023-02-12 DIAGNOSIS — G8314 Monoplegia of lower limb affecting left nondominant side: Secondary | ICD-10-CM | POA: Diagnosis present

## 2023-02-12 DIAGNOSIS — G9340 Encephalopathy, unspecified: Secondary | ICD-10-CM | POA: Diagnosis present

## 2023-02-12 DIAGNOSIS — S20212A Contusion of left front wall of thorax, initial encounter: Secondary | ICD-10-CM | POA: Diagnosis present

## 2023-02-12 DIAGNOSIS — Z66 Do not resuscitate: Secondary | ICD-10-CM | POA: Diagnosis present

## 2023-02-12 DIAGNOSIS — W1839XA Other fall on same level, initial encounter: Secondary | ICD-10-CM | POA: Diagnosis present

## 2023-02-12 DIAGNOSIS — E785 Hyperlipidemia, unspecified: Secondary | ICD-10-CM | POA: Diagnosis present

## 2023-02-12 DIAGNOSIS — Z8249 Family history of ischemic heart disease and other diseases of the circulatory system: Secondary | ICD-10-CM

## 2023-02-12 DIAGNOSIS — F0390 Unspecified dementia without behavioral disturbance: Secondary | ICD-10-CM | POA: Diagnosis present

## 2023-02-12 DIAGNOSIS — I639 Cerebral infarction, unspecified: Secondary | ICD-10-CM | POA: Diagnosis present

## 2023-02-12 DIAGNOSIS — T796XXA Traumatic ischemia of muscle, initial encounter: Secondary | ICD-10-CM

## 2023-02-12 DIAGNOSIS — R29706 NIHSS score 6: Secondary | ICD-10-CM | POA: Diagnosis present

## 2023-02-12 DIAGNOSIS — Z79899 Other long term (current) drug therapy: Secondary | ICD-10-CM

## 2023-02-12 DIAGNOSIS — H539 Unspecified visual disturbance: Secondary | ICD-10-CM | POA: Diagnosis present

## 2023-02-12 DIAGNOSIS — K219 Gastro-esophageal reflux disease without esophagitis: Secondary | ICD-10-CM | POA: Diagnosis present

## 2023-02-12 DIAGNOSIS — Z7982 Long term (current) use of aspirin: Secondary | ICD-10-CM

## 2023-02-12 DIAGNOSIS — K5900 Constipation, unspecified: Secondary | ICD-10-CM | POA: Diagnosis present

## 2023-02-12 DIAGNOSIS — R109 Unspecified abdominal pain: Secondary | ICD-10-CM | POA: Diagnosis not present

## 2023-02-12 DIAGNOSIS — Z1152 Encounter for screening for COVID-19: Secondary | ICD-10-CM

## 2023-02-12 DIAGNOSIS — I10 Essential (primary) hypertension: Secondary | ICD-10-CM | POA: Diagnosis present

## 2023-02-12 HISTORY — DX: Hyperlipidemia, unspecified: E78.5

## 2023-02-12 HISTORY — DX: Carpal tunnel syndrome, bilateral upper limbs: G56.03

## 2023-02-12 LAB — CBC WITH DIFFERENTIAL/PLATELET
Abs Immature Granulocytes: 0.05 10*3/uL (ref 0.00–0.07)
Basophils Absolute: 0 10*3/uL (ref 0.0–0.1)
Basophils Relative: 0 %
Eosinophils Absolute: 0 10*3/uL (ref 0.0–0.5)
Eosinophils Relative: 0 %
HCT: 47.1 % (ref 39.0–52.0)
Hemoglobin: 15.8 g/dL (ref 13.0–17.0)
Immature Granulocytes: 1 %
Lymphocytes Relative: 6 %
Lymphs Abs: 0.6 10*3/uL — ABNORMAL LOW (ref 0.7–4.0)
MCH: 30.2 pg (ref 26.0–34.0)
MCHC: 33.5 g/dL (ref 30.0–36.0)
MCV: 89.9 fL (ref 80.0–100.0)
Monocytes Absolute: 0.6 10*3/uL (ref 0.1–1.0)
Monocytes Relative: 6 %
Neutro Abs: 9.1 10*3/uL — ABNORMAL HIGH (ref 1.7–7.7)
Neutrophils Relative %: 87 %
Platelets: 237 10*3/uL (ref 150–400)
RBC: 5.24 MIL/uL (ref 4.22–5.81)
RDW: 13.2 % (ref 11.5–15.5)
WBC: 10.4 10*3/uL (ref 4.0–10.5)
nRBC: 0 % (ref 0.0–0.2)

## 2023-02-12 LAB — URINALYSIS, COMPLETE (UACMP) WITH MICROSCOPIC
Bacteria, UA: NONE SEEN
Bilirubin Urine: NEGATIVE
Glucose, UA: NEGATIVE mg/dL
Ketones, ur: 20 mg/dL — AB
Leukocytes,Ua: NEGATIVE
Nitrite: NEGATIVE
Protein, ur: 30 mg/dL — AB
Specific Gravity, Urine: 1.019 (ref 1.005–1.030)
pH: 6 (ref 5.0–8.0)

## 2023-02-12 LAB — COMPREHENSIVE METABOLIC PANEL
ALT: 22 U/L (ref 0–44)
AST: 47 U/L — ABNORMAL HIGH (ref 15–41)
Albumin: 4.2 g/dL (ref 3.5–5.0)
Alkaline Phosphatase: 80 U/L (ref 38–126)
Anion gap: 10 (ref 5–15)
BUN: 16 mg/dL (ref 8–23)
CO2: 23 mmol/L (ref 22–32)
Calcium: 9.3 mg/dL (ref 8.9–10.3)
Chloride: 105 mmol/L (ref 98–111)
Creatinine, Ser: 1.09 mg/dL (ref 0.61–1.24)
GFR, Estimated: >60 mL/min (ref >60)
Glucose, Bld: 126 mg/dL — ABNORMAL HIGH (ref 70–99)
Potassium: 3.9 mmol/L (ref 3.5–5.1)
Sodium: 138 mmol/L (ref 135–145)
Total Bilirubin: 1 mg/dL (ref 0.3–1.2)
Total Protein: 7.8 g/dL (ref 6.5–8.1)

## 2023-02-12 LAB — LIPASE, BLOOD: Lipase: 21 U/L (ref 11–51)

## 2023-02-12 LAB — TROPONIN I (HIGH SENSITIVITY)
Troponin I (High Sensitivity): 134 ng/L (ref <18)
Troponin I (High Sensitivity): 118 ng/L (ref <18)

## 2023-02-12 LAB — SARS CORONAVIRUS 2 BY RT PCR: SARS Coronavirus 2 by RT PCR: NEGATIVE

## 2023-02-12 LAB — PROCALCITONIN: Procalcitonin: <0.1 ng/mL

## 2023-02-12 LAB — ETHANOL: Alcohol, Ethyl (B): <10 mg/dL (ref <10)

## 2023-02-12 LAB — CK: Total CK: 1410 U/L — ABNORMAL HIGH (ref 49–397)

## 2023-02-12 MED ORDER — ASPIRIN 81 MG PO TBEC: 81.0000 mg | DELAYED_RELEASE_TABLET | Freq: Every day | ORAL | Status: DC

## 2023-02-12 MED ORDER — ONDANSETRON HCL 4 MG/2ML IJ SOLN: 4.0000 mg | Freq: Four times a day (QID) | INTRAMUSCULAR | Status: DC | PRN

## 2023-02-12 MED ORDER — ENOXAPARIN SODIUM 40 MG/0.4ML IJ SOSY
40.0000 mg | PREFILLED_SYRINGE | INTRAMUSCULAR | Status: DC
  Administered 2023-02-13 – 2023-02-18 (×7): 40 mg via SUBCUTANEOUS
  Filled 2023-02-12 (×7): qty 0.4

## 2023-02-12 MED ORDER — ALBUTEROL SULFATE HFA 108 (90 BASE) MCG/ACT IN AERS: 2.0000 | INHALATION_SPRAY | RESPIRATORY_TRACT | Status: DC | PRN

## 2023-02-12 MED ORDER — POTASSIUM CHLORIDE IN NACL 20-0.9 MEQ/L-% IV SOLN
INTRAVENOUS | Status: DC
  Filled 2023-02-12 (×3): qty 1000

## 2023-02-12 MED ORDER — GABAPENTIN 100 MG PO CAPS: 100.0000 mg | ORAL_CAPSULE | Freq: Every day | ORAL | Status: DC

## 2023-02-12 MED ORDER — SODIUM CHLORIDE 0.9 % IV BOLUS
1000.0000 mL | Freq: Once | INTRAVENOUS | Status: AC
  Administered 2023-02-12: 1000 mL via INTRAVENOUS

## 2023-02-12 MED ORDER — STROKE: EARLY STAGES OF RECOVERY BOOK: Freq: Once | Status: AC

## 2023-02-12 MED ORDER — CLOPIDOGREL BISULFATE 75 MG PO TABS
75.0000 mg | ORAL_TABLET | Freq: Every day | ORAL | Status: DC
  Administered 2023-02-13 – 2023-02-19 (×7): 75 mg via ORAL
  Filled 2023-02-12 (×7): qty 1

## 2023-02-12 MED ORDER — ASPIRIN 325 MG PO TABS
325.0000 mg | ORAL_TABLET | Freq: Every day | ORAL | Status: DC
  Administered 2023-02-13: 325 mg via ORAL
  Filled 2023-02-12: qty 1

## 2023-02-12 MED ORDER — CLOPIDOGREL BISULFATE 75 MG PO TABS
300.0000 mg | ORAL_TABLET | Freq: Once | ORAL | Status: AC
  Administered 2023-02-12: 300 mg via ORAL
  Filled 2023-02-12: qty 4

## 2023-02-12 MED ORDER — ONDANSETRON HCL 4 MG PO TABS: 4.0000 mg | ORAL_TABLET | Freq: Four times a day (QID) | ORAL | Status: DC | PRN

## 2023-02-12 MED ORDER — ALBUTEROL SULFATE (2.5 MG/3ML) 0.083% IN NEBU: 2.5000 mg | INHALATION_SOLUTION | RESPIRATORY_TRACT | Status: DC | PRN

## 2023-02-12 MED ORDER — ACETAMINOPHEN 650 MG RE SUPP: 650.0000 mg | Freq: Four times a day (QID) | RECTAL | Status: DC | PRN

## 2023-02-12 MED ORDER — IOHEXOL 350 MG/ML SOLN
75.0000 mL | Freq: Once | INTRAVENOUS | Status: AC | PRN
  Administered 2023-02-12: 75 mL via INTRAVENOUS

## 2023-02-12 MED ORDER — OXYCODONE HCL 5 MG PO TABS: 5.0000 mg | ORAL_TABLET | ORAL | Status: DC | PRN

## 2023-02-12 MED ORDER — ACETAMINOPHEN 325 MG PO TABS: 650.0000 mg | ORAL_TABLET | Freq: Four times a day (QID) | ORAL | Status: DC | PRN

## 2023-02-12 MED ORDER — PANTOPRAZOLE SODIUM 40 MG PO TBEC
40.0000 mg | DELAYED_RELEASE_TABLET | Freq: Every day | ORAL | Status: DC
  Administered 2023-02-12 – 2023-02-19 (×8): 40 mg via ORAL
  Filled 2023-02-12 (×8): qty 1

## 2023-02-12 MED ORDER — SENNA 8.6 MG PO TABS
1.0000 | ORAL_TABLET | Freq: Every evening | ORAL | Status: DC | PRN
  Administered 2023-02-16: 8.6 mg via ORAL
  Filled 2023-02-12: qty 1

## 2023-02-12 NOTE — ED Triage Notes (Signed)
Pt. To ED for fall sometime between 1830 last night and 1300 today. Pt. Was found on the bathroom floor by grandson. Pt. Denies any pain/complaint or that he fell however pt. Has multiple abrasions, bruises and swelling to left side of face/body. Pt. Is alert and oriented, and conversational during triage. Pt's grandson states he has mental status decline over the last few weeks.

## 2023-02-12 NOTE — ED Notes (Signed)
Hospitalist at bedside to speak with pt family

## 2023-02-12 NOTE — ED Notes (Signed)
Informed RN Morrie Sheldon via phone/ pt has bed assigned

## 2023-02-12 NOTE — ED Notes (Signed)
Dr. Georgeann Oppenheim notified pt passed swallow screen, states to d/c speech eval.

## 2023-02-12 NOTE — ED Provider Notes (Signed)
[facility] Provider Note    Event Date/Time   First MD Initiated Contact with Patient 02/12/23 1340     (approximate)   History   Chief Complaint: Fall   HPI  Thomas [name] is a 89 y.o. male with a history of hypertension GERD who is brought to the ED due to generalized weakness.  Patient lives alone, family checks on him and brings him meals.  They last saw him at 6:30 PM last night when they took him dinner and he was in his usual state of health.  Today at 1 PM they found him on the bathroom floor.  Patient denies any pain.  Patient is confused and can only offer a rambling incoherent account of recent events.     Physical Exam   Triage Vital Signs: ED Triage Vitals [02/12/23 1338]  Encounter Vitals Group     BP      Systolic BP Percentile      Diastolic BP Percentile      Pulse      Resp      Temp      Temp src      SpO2 95 %     Weight      Height      Head Circumference      Peak Flow      Pain Score      Pain Loc      Pain Education      Exclude from Growth Chart     Most recent vital signs: Vitals:   02/12/23 1338  BP: 102/76  Pulse: 84  Resp: 18  Temp: 98.2 F (36.8 C)  SpO2: 98%    General: Awake, no distress.  CV:  Good peripheral perfusion.  Regular rate and rhythm Resp:  Normal effort.  Clear to auscultation bilaterally Abd:  No distention.  Soft nontender Other:  Full range of motion all extremities.  Mild tenderness of the left hip and left chest wall and left proximal humerus.  A few small abrasions on the upper extremities without laceration.  There is edema of the left side of the face, possibly related to blunt trauma or dependent edema from prolonged time on the floor.  Dry oral mucosa.  Cranial nerves III through XII intact   ED Results / Procedures / Treatments   Labs (all labs ordered are listed, but only abnormal results are displayed) Labs Reviewed  COMPREHENSIVE METABOLIC PANEL - Abnormal; Notable  for the following components:      Result Value   Glucose, Bld 126 (*)    AST 47 (*)    All other components within normal limits  CBC WITH DIFFERENTIAL/PLATELET - Abnormal; Notable for the following components:   Neutro Abs 9.1 (*)    Lymphs Abs 0.6 (*)    All other components within normal limits  TROPONIN I (HIGH SENSITIVITY) - Abnormal; Notable for the following components:   Troponin I (High Sensitivity) 134 (*)    All other components within normal limits  SARS CORONAVIRUS 2 BY RT PCR  ETHANOL  LIPASE, BLOOD  URINALYSIS, W/ REFLEX TO CULTURE (INFECTION SUSPECTED)  CK     EKG Interpreted by me Sinus rhythm rate of 89.  Left axis, right bundle branch block.  No acute ischemic changes.  1 PVC on the strip.   RADIOLOGY CT head interpreted by me, negative for intracranial hemorrhage.  Radiology report reviewed  Hip x-ray negative for hip fracture   PROCEDURES:  Procedures   MEDICATIONS ORDERED IN ED: Medications  sodium chloride 0.9 % bolus 1,000 mL (has no administration in time range)     IMPRESSION / MDM / ASSESSMENT AND PLAN / ED COURSE  I reviewed the triage vital signs and the nursing notes.  DDx: Hip fracture, proximal humerus fracture, rib fracture, pneumothorax, pulmonary edema, pneumonia, UTI, electrolyte abnormality, anemia, AKI, dehydration, deconditioning, intracranial hemorrhage, C-spine fracture, syncope, arrhythmia, non-STEMI  Patient's presentation is most consistent with acute presentation with potential threat to life or bodily function.  Patient presents with generalized weakness, suspect prolonged time stuck on the floor.  Will obtain imaging for trauma workup, check labs for metabolic workup.  Vital signs unremarkable, not in distress at this time.  IV fluids for hydration       FINAL CLINICAL IMPRESSION(S) / ED DIAGNOSES   Final diagnoses:  Generalized weakness  Contusion of left chest wall, initial encounter     Rx / DC Orders    ED Discharge Orders     None        Note:  This document was prepared using Dragon voice recognition software and may include unintentional dictation errors.   Sharman Cheek, MD 02/12/23 [phone]

## 2023-02-12 NOTE — Progress Notes (Signed)
CROSS COVER NOTE  NAME: ALLYSON HOLZHAUSEN MRN: [id number] DOB : 1930/05/16  Received page at 1929 from patient's care nurse stating the MRI tech wanted to clarify if we needed an MRA after the plain MRI films as the patient could possibly have an infarct.  Went to MRI to assess patient but patient was done with the procedure.  Patient's chart was reviewed and he was admitted for encephalopathy, rhabdomyolysis and suspicion of cerebrovascular accident.  ASSESSMENT Found patient laying in bed.  Patient awake and alert and able to precipitate in exam.  1A: Level of Consciousness - 0 1B: Ask Month and Age - 1 1C: Blink Eyes & Squeeze Hands - 0 2: Test Horizontal Extraocular Movements - 0 3: Test Visual Fields - 1 4: Test Facial Palsy (Use Grimace if Obtunded) - 1 5A: Test Left Arm Motor Drift - 1 5B: Test Right Arm Motor Drift - 0 6A: Test Left Leg Motor Drift - 0 6B: Test Right Leg Motor Drift - 0 7: Test Limb Ataxia (FNF/Heel-Shin) - 1 8: Test Sensation - Normal; 0 No sensory loss + 0 9: Test Language/Aphasia - 1 10: Test Dysarthria - 0 11: Test Extinction/Inattention - 0   NIHSS Score: 6  Imaging  MR BRAIN WO CONTRAST  Result Date: 02/12/2023 IMPRESSION: 1. 6 mm acute nonhemorrhagic infarct involving the superior right thalamus. 2. 7 mm acute/subacute nonhemorrhagic infarct involving the right centrum semi ovale. 3. 4 mm subcortical acute infarct in the right corona radiata. 4. Multiple remote lacunar infarcts involving the basal ganglia, thalami and bilateral cerebellum. 5. Advanced atrophy and diffuse white matter disease likely reflects the sequela of chronic microvascular ischemia. Electronically Signed   By: Marin Roberts M.D.   On: 02/12/2023 21:37   CT ANGIO HEAD NECK W WO CM  Result Date: 02/12/2023 IMPRESSION: 1. Moderate proximal P2 segment stenoses bilaterally. This may correspond with the acute right thalamic infarct. 2. Tandem more high-grade P2 segment  stenoses bilaterally, more proximal on the left. 3. High-grade stenosis of the inferior left P3 segment. 4. Atherosclerotic changes at the carotid bifurcations bilaterally and cavernous internal carotid arteries bilaterally without focal stenosis. 5. 4.1 x 3.5 x 3.5 cm heterogeneous right level 3 and level 4 neck mass. The lesion is well-circumscribed. Differential diagnosis includes a schwannoma. Other nerve sheath tumors are considered as well. This is somewhat inferior for paraganglioma. Malignancy is considered less likely. 6. Multilevel spondylosis of the cervical spine. 7.  Aortic Atherosclerosis (ICD10-I70.0). These results were called by telephone at the time of interpretation on 02/12/2023 at 9:27 pm to provider [facility], who verbally acknowledged these results. Electronically Signed   By: Marin Roberts M.D.   On: 02/12/2023 21:28    Plan  CEREBROVASCULAR ACCIDENT  MRI with both acute and subacute infarcts. 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 Amada Jupiter. He does not anticipate any immediate intervention given LKW time but recommended the following:   - 300 Clopidogrel loading dose followed by &5 mg daily.  - CTA of head and Neck  - Neurology to follow up in the morning  - Statin therapy held due to diagnosis of rhabdomyolysis - Pt to be admitted to [facility] Telemetry Floor  - TTE ordered -  Initiated dual antiplatelet therapy with Aspirin 81 mg daily and Clopidogrel 75 mg daily.  - A1C and LDL ordered - q4H neuro  checks ordered - PT/OT/SLP consult placed

## 2023-02-12 NOTE — ED Notes (Signed)
Pt transported to CT ?

## 2023-02-12 NOTE — ED Notes (Signed)
Date and time results received: 02/12/23 1504 Test: Troponin Critical Value: 134  Name of Provider Notified: MD Scotty Court

## 2023-02-12 NOTE — ED Notes (Signed)
Pt transported to MRI 

## 2023-02-12 NOTE — ED Notes (Signed)
Hospitalist at bedside 

## 2023-02-12 NOTE — H&P (Signed)
History and Physical    Thomas [name] ZOX:[id number] DOB: 08-06-1930 DOA: 02/12/2023  PCP: Barbette Reichmann, MD Patient coming from: Home  I have personally briefly reviewed patient's old medical records in [facility]  Chief Complaint: Found down  HPI: Thomas [name] is a 89 y.o. male with medical history significant of essential hypertension, GERD who lives alone who was brought to the ED due to generalized weakness.  Found down at home.  Patient lives alone and family checks on him and brings in meals.  Last seen approximately 631-day prior to admission when they took him to dinner and he was in his usual state of health.  Returned the next morning approximately 1 PM to find him on the bathroom floor.  Patient is confused and unable to provide accurate history.  Does deny pain.  On initial presentation patient is hemodynamically stable.  He has some scattered bruises but is not endorsing any pain.  Laboratory investigation significant for mildly elevated troponin to 134.  EKG nonischemic.  Patient has evidence of rhabdomyolysis.  Relatively mild.  On my evaluation patient resting in bed.  Son-in-law and granddaughter at bedside.  Patient is able to identify them with some coaxing.  His orientation is somewhat questionable which family states is a acute change for him.  ED Course: Initial skeletal survey negative for fracture.  CT head reassuring.  Evidence of volume loss indicative of underlying dementia.  Given rhabdomyolysis, elevated troponin hospitalist contacted for admission.  Review of Systems: As per HPI otherwise 14 point review of systems negative.    Past Medical History:  Diagnosis Date   Arthritis    GERD (gastroesophageal reflux disease)    Hypertension     Past Surgical History:  Procedure Laterality Date   INGUINAL HERNIA REPAIR  2001   REPLACEMENT TOTAL KNEE Bilateral      reports that he has never smoked. He has never used smokeless tobacco. He reports that  he does not drink alcohol and does not use drugs.  No Known Allergies  Family History  Problem Relation Age of Onset   Cancer Sister        Liver cancer   Melanoma Sister    Cancer Brother        Pancreatic   Cancer Brother        Lung cancer   Heart disease Brother      Prior to Admission medications   Medication Sig Start Date End Date Taking? Authorizing Provider  acetaminophen (TYLENOL) 325 MG tablet Take 2 tablets (650 mg total) by mouth every 6 (six) hours as needed for fever, headache or moderate pain. 05/31/20   Alford Highland, MD  albuterol (VENTOLIN HFA) 108 (90 Base) MCG/ACT inhaler Inhale 2 puffs into the lungs every 4 (four) hours as needed for wheezing or shortness of breath. 05/24/21   Pennie Banter, DO  ascorbic acid (VITAMIN C) 500 MG tablet Take 1 tablet (500 mg total) by mouth daily. 05/31/20   Alford Highland, MD  aspirin EC 81 MG tablet Take by mouth. Patient not taking: Reported on 05/20/2021    [provider]  furosemide (LASIX) 20 MG tablet Take 1 tablet (20 mg total) by mouth daily. 01/25/23 01/25/24  Minna Antis, MD  gabapentin (NEURONTIN) 100 MG capsule Take 100 mg by mouth at bedtime.    [provider]  guaiFENesin-dextromethorphan (ROBITUSSIN DM) 100-10 MG/5ML syrup Take 10 mLs by mouth every 4 (four) hours as needed for cough. 05/24/21  Esaw Grandchild A, DO  lovastatin (MEVACOR) 40 MG tablet Take by mouth. 03/14/12   [provider]  omeprazole (PRILOSEC) 20 MG capsule Take 1 capsule by mouth daily. 10/24/18   [provider]  senna (SENOKOT) 8.6 MG TABS tablet Take 1 tablet (8.6 mg total) by mouth at bedtime as needed for mild constipation. 05/24/21   Pennie Banter, DO    Physical Exam: Vitals:   02/12/23 1338  BP: 102/76  Pulse: 84  Resp: 18  Temp: 98.2 F (36.8 C)  TempSrc: Oral  SpO2: 98%    Vitals:   02/12/23 1338  BP: 102/76  Pulse: 84  Resp: 18  Temp: 98.2 F (36.8 C)  TempSrc: Oral   SpO2: 98%   General: NAD.  Appears stated age HEENT: Normocephalic, atraumatic Neck, supple, trachea midline, no tenderness Heart: Regular rate and rhythm, S1/S2 normal, no murmurs Lungs: Lungs clear.  Normal work of breathing.  Room air Abdomen: Soft, nontender, nondistended, positive bowel sounds Extremities: Normal, atraumatic, no clubbing or cyanosis, normal muscle tone Skin: Scattered chemosis Neurologic: Nerves grossly intact.  Oriented to person and place Psychiatric: Normal affect   Labs on Admission: I have personally reviewed following labs and imaging studies  CBC: Recent Labs  Lab 02/12/23 1400  WBC 10.4  NEUTROABS 9.1*  HGB 15.8  HCT 47.1  MCV 89.9  PLT 237   Basic Metabolic Panel: Recent Labs  Lab 02/12/23 1400  NA 138  K 3.9  CL 105  CO2 23  GLUCOSE 126*  BUN 16  CREATININE 1.09  CALCIUM 9.3   GFR: CrCl cannot be calculated (Unknown ideal weight.). Liver Function Tests: Recent Labs  Lab 02/12/23 1400  AST 47*  ALT 22  ALKPHOS 80  BILITOT 1.0  PROT 7.8  ALBUMIN 4.2   Recent Labs  Lab 02/12/23 1400  LIPASE 21   No results for input(s): "AMMONIA" in the last 168 hours. Coagulation Profile: No results for input(s): "INR", "PROTIME" in the last 168 hours. Cardiac Enzymes: Recent Labs  Lab 02/12/23 1400  CKTOTAL 1,410*   BNP (last 3 results) No results for input(s): "PROBNP" in the last 8760 hours. HbA1C: No results for input(s): "HGBA1C" in the last 72 hours. CBG: No results for input(s): "GLUCAP" in the last 168 hours. Lipid Profile: No results for input(s): "CHOL", "HDL", "LDLCALC", "TRIG", "CHOLHDL", "LDLDIRECT" in the last 72 hours. Thyroid Function Tests: No results for input(s): "TSH", "T4TOTAL", "FREET4", "T3FREE", "THYROIDAB" in the last 72 hours. Anemia Panel: No results for input(s): "VITAMINB12", "FOLATE", "FERRITIN", "TIBC", "IRON", "RETICCTPCT" in the last 72 hours. Urine analysis:    Component Value Date/Time    COLORURINE AMBER (A) 01/25/2023 1357   APPEARANCEUR HAZY (A) 01/25/2023 1357   APPEARANCEUR Clear 10/19/2011 0819   LABSPEC 1.025 01/25/2023 1357   LABSPEC 1.016 10/19/2011 0819   PHURINE 5.0 01/25/2023 1357   GLUCOSEU NEGATIVE 01/25/2023 1357   GLUCOSEU Negative 10/19/2011 0819   HGBUR NEGATIVE 01/25/2023 1357   BILIRUBINUR NEGATIVE 01/25/2023 1357   BILIRUBINUR Negative 10/19/2011 0819   KETONESUR NEGATIVE 01/25/2023 1357   PROTEINUR 30 (A) 01/25/2023 1357   NITRITE NEGATIVE 01/25/2023 1357   LEUKOCYTESUR NEGATIVE 01/25/2023 1357   LEUKOCYTESUR Negative 10/19/2011 0819    Radiological Exams on Admission: DG Humerus Left  Result Date: 02/12/2023 CLINICAL DATA:  Arm pain after fall EXAM: LEFT HUMERUS - 2+ VIEW COMPARISON:  None Available. FINDINGS: No fracture or dislocation. Soft tissues are unremarkable. Probable calcific tendinopathy at the humeral head  IMPRESSION: No acute osseous abnormality Electronically Signed   By: Jasmine Pang M.D.   On: 02/12/2023 15:59   DG Chest 1 View  Result Date: 02/12/2023 CLINICAL DATA:  Pain left chest wall EXAM: CHEST  1 VIEW COMPARISON:  01/25/2023 FINDINGS: No acute airspace disease or effusion. Stable cardiomediastinal silhouette with aortic atherosclerosis. No pneumothorax. IMPRESSION: No active disease. Electronically Signed   By: Jasmine Pang M.D.   On: 02/12/2023 15:58   DG Hip Unilat W or Wo Pelvis 2-3 Views Left  Result Date: 02/12/2023 CLINICAL DATA:  Left hip pain after fall EXAM: DG HIP (WITH OR WITHOUT PELVIS) 2-3V LEFT COMPARISON:  None Available. FINDINGS: SI joint are non widened. Pubic symphysis and rami appear intact. No fracture or malalignment. Mild hip degenerative change IMPRESSION: No acute osseous abnormality. Electronically Signed   By: Jasmine Pang M.D.   On: 02/12/2023 15:57   CT Head Wo Contrast  Result Date: 02/12/2023 CLINICAL DATA:  Head trauma found on bathroom floor EXAM: CT HEAD WITHOUT CONTRAST CT CERVICAL  SPINE WITHOUT CONTRAST TECHNIQUE: Multidetector CT imaging of the head and cervical spine was performed following the standard protocol without intravenous contrast. Multiplanar CT image reconstructions of the cervical spine were also generated. RADIATION DOSE REDUCTION: This exam was performed according to the departmental dose-optimization program which includes automated exposure control, adjustment of the mA and/or kV according to patient size and/or use of iterative reconstruction technique. COMPARISON:  CT brain 01/25/2023, CT brain and cervical spine 05/19/2021 FINDINGS: CT HEAD FINDINGS Brain: No acute territorial infarction, hemorrhage or intracranial mass. Atrophy and extensive chronic small vessel ischemic changes of the white matter. Small chronic cerebellar infarcts. Chronic lacunar infarcts within the thalamus and basal ganglia. Stable ventricle size. Vascular: No hyperdense vessels.  Carotid vascular calcification Skull: Normal. Negative for fracture or focal lesion. Sinuses/Orbits: Mild mucosal thickening in the sinuses. Incompletely visualized expansile lytic lesion within the anterior maxilla, this measures 2.4 cm. Other: None CT CERVICAL SPINE FINDINGS Alignment: Reversal of cervical lordosis. Trace anterolisthesis C3 on C4, C4 on C5 and C7 on T1. Trace retrolisthesis C5 on C6. Facet alignment is within normal limits Skull base and vertebrae: No acute fracture. No primary bone lesion or focal pathologic process. Soft tissues and spinal canal: No prevertebral fluid or swelling. No visible canal hematoma. Disc levels: Advanced multilevel degenerative change. Severe disc space narrowing C4-C5, C5-C6, C6-C7 and C7-T1. Facet degenerative changes at multiple levels with foraminal narrowing Upper chest: Lung apices are clear. 3.8 x 3.6 cm right supraclavicular mass, stable to slightly enlarged, correlate with prior biopsy results. Other: None IMPRESSION: 1. No CT evidence for acute intracranial  abnormality. Atrophy and extensive chronic small vessel ischemic changes of the white matter. 2. Reversal of cervical lordosis with advanced multilevel degenerative change. No acute osseous abnormality. 3. 3.8 cm right supraclavicular mass, stable to slightly enlarged. Correlate with prior biopsy results 4. Incompletely visualized expansile lytic lesion within the anterior maxilla. This may be evaluated with nonemergent facial CT and potential oral surgery consultation. Electronically Signed   By: Jasmine Pang M.D.   On: 02/12/2023 15:56   CT Cervical Spine Wo Contrast  Result Date: 02/12/2023 CLINICAL DATA:  Head trauma found on bathroom floor EXAM: CT HEAD WITHOUT CONTRAST CT CERVICAL SPINE WITHOUT CONTRAST TECHNIQUE: Multidetector CT imaging of the head and cervical spine was performed following the standard protocol without intravenous contrast. Multiplanar CT image reconstructions of the cervical spine were also generated. RADIATION DOSE REDUCTION: This exam was  performed according to the departmental dose-optimization program which includes automated exposure control, adjustment of the mA and/or kV according to patient size and/or use of iterative reconstruction technique. COMPARISON:  CT brain 01/25/2023, CT brain and cervical spine 05/19/2021 FINDINGS: CT HEAD FINDINGS Brain: No acute territorial infarction, hemorrhage or intracranial mass. Atrophy and extensive chronic small vessel ischemic changes of the white matter. Small chronic cerebellar infarcts. Chronic lacunar infarcts within the thalamus and basal ganglia. Stable ventricle size. Vascular: No hyperdense vessels.  Carotid vascular calcification Skull: Normal. Negative for fracture or focal lesion. Sinuses/Orbits: Mild mucosal thickening in the sinuses. Incompletely visualized expansile lytic lesion within the anterior maxilla, this measures 2.4 cm. Other: None CT CERVICAL SPINE FINDINGS Alignment: Reversal of cervical lordosis. Trace  anterolisthesis C3 on C4, C4 on C5 and C7 on T1. Trace retrolisthesis C5 on C6. Facet alignment is within normal limits Skull base and vertebrae: No acute fracture. No primary bone lesion or focal pathologic process. Soft tissues and spinal canal: No prevertebral fluid or swelling. No visible canal hematoma. Disc levels: Advanced multilevel degenerative change. Severe disc space narrowing C4-C5, C5-C6, C6-C7 and C7-T1. Facet degenerative changes at multiple levels with foraminal narrowing Upper chest: Lung apices are clear. 3.8 x 3.6 cm right supraclavicular mass, stable to slightly enlarged, correlate with prior biopsy results. Other: None IMPRESSION: 1. No CT evidence for acute intracranial abnormality. Atrophy and extensive chronic small vessel ischemic changes of the white matter. 2. Reversal of cervical lordosis with advanced multilevel degenerative change. No acute osseous abnormality. 3. 3.8 cm right supraclavicular mass, stable to slightly enlarged. Correlate with prior biopsy results 4. Incompletely visualized expansile lytic lesion within the anterior maxilla. This may be evaluated with nonemergent facial CT and potential oral surgery consultation. Electronically Signed   By: Jasmine Pang M.D.   On: 02/12/2023 15:56    EKG: Independently reviewed.  Normal sinus rhythm  Assessment/Plan Principal Problem:   Rhabdomyolysis  Rhabdomyolysis Found down Acute encephalopathy Unclear nature.  Unable to exclude cerebrovascular event.  CT head reassuring.  Family does endorse some left-sided weakness and some visual changes. Plan: Place in observation Aspirin 81 mg daily MRI brain without contrast PT OT SLP evaluations Neurology evaluation if MRI positive  Rhabdomyolysis Mild.  CK1300 Normal saline with 20 of K at 100 cc/h  Elevated troponin Unclear etiology.  Suspect supply/demand ischemia in the setting of fall and possibly cerebrovascular event Plan: Cardiac monitoring Trend troponin to  peak  Essential hypertension Holding antihypertensives for now  GERD PPI      DVT prophylaxis: Lovenox SQ Code Status: Full code.  Discussed with family.  They will discuss with the patient's daughter and let us know about decision Family Communication: Granddaughter and son-in-law at bedside Disposition Plan: Anticipate return to previous home environment Consults called: None at this time Admission status: Observation, medical telemetry   Tresa Moore MD [facility]   If 7PM-7AM, please contact night-coverage   02/12/2023, 5:00 PM

## 2023-02-13 ENCOUNTER — Observation Stay
Admit: 2023-02-13 | Discharge: 2023-02-13 | Disposition: A | Discharge status: Home / Self Care | Payer: Medicare (Managed Care) | Attending: Acute Care

## 2023-02-13 ENCOUNTER — Encounter: Payer: Self-pay | Admitting: Internal Medicine

## 2023-02-13 DIAGNOSIS — Z8249 Family history of ischemic heart disease and other diseases of the circulatory system: Secondary | ICD-10-CM | POA: Diagnosis not present

## 2023-02-13 DIAGNOSIS — F0390 Unspecified dementia without behavioral disturbance: Secondary | ICD-10-CM | POA: Diagnosis present

## 2023-02-13 DIAGNOSIS — I63231 Cerebral infarction due to unspecified occlusion or stenosis of right carotid arteries: Secondary | ICD-10-CM | POA: Diagnosis present

## 2023-02-13 DIAGNOSIS — K5900 Constipation, unspecified: Secondary | ICD-10-CM | POA: Diagnosis present

## 2023-02-13 DIAGNOSIS — Z7982 Long term (current) use of aspirin: Secondary | ICD-10-CM | POA: Diagnosis not present

## 2023-02-13 DIAGNOSIS — E785 Hyperlipidemia, unspecified: Secondary | ICD-10-CM | POA: Diagnosis present

## 2023-02-13 DIAGNOSIS — T796XXA Traumatic ischemia of muscle, initial encounter: Secondary | ICD-10-CM | POA: Diagnosis not present

## 2023-02-13 DIAGNOSIS — R109 Unspecified abdominal pain: Secondary | ICD-10-CM | POA: Diagnosis not present

## 2023-02-13 DIAGNOSIS — G8314 Monoplegia of lower limb affecting left nondominant side: Secondary | ICD-10-CM | POA: Diagnosis present

## 2023-02-13 DIAGNOSIS — W1839XA Other fall on same level, initial encounter: Secondary | ICD-10-CM | POA: Diagnosis present

## 2023-02-13 DIAGNOSIS — I639 Cerebral infarction, unspecified: Secondary | ICD-10-CM | POA: Diagnosis present

## 2023-02-13 DIAGNOSIS — K219 Gastro-esophageal reflux disease without esophagitis: Secondary | ICD-10-CM | POA: Diagnosis present

## 2023-02-13 DIAGNOSIS — R29706 NIHSS score 6: Secondary | ICD-10-CM | POA: Diagnosis present

## 2023-02-13 DIAGNOSIS — Z79899 Other long term (current) drug therapy: Secondary | ICD-10-CM | POA: Diagnosis not present

## 2023-02-13 DIAGNOSIS — R7989 Other specified abnormal findings of blood chemistry: Secondary | ICD-10-CM | POA: Diagnosis present

## 2023-02-13 DIAGNOSIS — M6282 Rhabdomyolysis: Secondary | ICD-10-CM | POA: Diagnosis present

## 2023-02-13 DIAGNOSIS — G9340 Encephalopathy, unspecified: Secondary | ICD-10-CM | POA: Diagnosis present

## 2023-02-13 DIAGNOSIS — Z96653 Presence of artificial knee joint, bilateral: Secondary | ICD-10-CM | POA: Diagnosis present

## 2023-02-13 DIAGNOSIS — R531 Weakness: Secondary | ICD-10-CM | POA: Diagnosis present

## 2023-02-13 DIAGNOSIS — S80212A Abrasion, left knee, initial encounter: Secondary | ICD-10-CM | POA: Diagnosis present

## 2023-02-13 DIAGNOSIS — H539 Unspecified visual disturbance: Secondary | ICD-10-CM | POA: Diagnosis present

## 2023-02-13 DIAGNOSIS — Z66 Do not resuscitate: Secondary | ICD-10-CM | POA: Diagnosis present

## 2023-02-13 DIAGNOSIS — Z1152 Encounter for screening for COVID-19: Secondary | ICD-10-CM | POA: Diagnosis not present

## 2023-02-13 DIAGNOSIS — S20212A Contusion of left front wall of thorax, initial encounter: Secondary | ICD-10-CM | POA: Diagnosis present

## 2023-02-13 DIAGNOSIS — I10 Essential (primary) hypertension: Secondary | ICD-10-CM | POA: Diagnosis present

## 2023-02-13 DIAGNOSIS — I6389 Other cerebral infarction: Secondary | ICD-10-CM | POA: Diagnosis not present

## 2023-02-13 LAB — CBC
HCT: 43.4 % (ref 39.0–52.0)
Hemoglobin: 14.4 g/dL (ref 13.0–17.0)
MCH: 30.1 pg (ref 26.0–34.0)
MCHC: 33.2 g/dL (ref 30.0–36.0)
MCV: 90.6 fL (ref 80.0–100.0)
Platelets: 206 10*3/uL (ref 150–400)
RBC: 4.79 MIL/uL (ref 4.22–5.81)
RDW: 13.4 % (ref 11.5–15.5)
WBC: 8.1 10*3/uL (ref 4.0–10.5)
nRBC: 0 % (ref 0.0–0.2)

## 2023-02-13 LAB — COMPREHENSIVE METABOLIC PANEL
ALT: 22 U/L (ref 0–44)
AST: 44 U/L — ABNORMAL HIGH (ref 15–41)
Albumin: 3.6 g/dL (ref 3.5–5.0)
Alkaline Phosphatase: 65 U/L (ref 38–126)
Anion gap: 8 (ref 5–15)
BUN: 13 mg/dL (ref 8–23)
CO2: 22 mmol/L (ref 22–32)
Calcium: 8.6 mg/dL — ABNORMAL LOW (ref 8.9–10.3)
Chloride: 106 mmol/L (ref 98–111)
Creatinine, Ser: 0.99 mg/dL (ref 0.61–1.24)
GFR, Estimated: >60 mL/min (ref >60)
Glucose, Bld: 94 mg/dL (ref 70–99)
Potassium: 3.6 mmol/L (ref 3.5–5.1)
Sodium: 136 mmol/L (ref 135–145)
Total Bilirubin: 1.4 mg/dL — ABNORMAL HIGH (ref 0.3–1.2)
Total Protein: 6.7 g/dL (ref 6.5–8.1)

## 2023-02-13 LAB — LIPID PANEL
Cholesterol: 157 mg/dL (ref 0–200)
HDL: 48 mg/dL (ref >40)
LDL Cholesterol: 87 mg/dL (ref 0–99)
Total CHOL/HDL Ratio: 3.3 {ratio}
Triglycerides: 111 mg/dL (ref <150)
VLDL: 22 mg/dL (ref 0–40)

## 2023-02-13 LAB — CK: Total CK: 1152 U/L — ABNORMAL HIGH (ref 49–397)

## 2023-02-13 LAB — PROTIME-INR
INR: 1.2 (ref 0.8–1.2)
Prothrombin Time: 15.2 s (ref 11.4–15.2)

## 2023-02-13 LAB — HEMOGLOBIN A1C
Hgb A1c MFr Bld: 5.4 % (ref 4.8–5.6)
Mean Plasma Glucose: 108.28 mg/dL

## 2023-02-13 LAB — ECHOCARDIOGRAM COMPLETE

## 2023-02-13 MED ORDER — TRAZODONE HCL 50 MG PO TABS
50.0000 mg | ORAL_TABLET | Freq: Every day | ORAL | Status: DC
  Administered 2023-02-13 – 2023-02-18 (×6): 50 mg via ORAL
  Filled 2023-02-13 (×6): qty 1

## 2023-02-13 MED ORDER — ASPIRIN 81 MG PO TBEC
81.0000 mg | DELAYED_RELEASE_TABLET | Freq: Every day | ORAL | Status: DC
  Administered 2023-02-14 – 2023-02-19 (×6): 81 mg via ORAL
  Filled 2023-02-13 (×6): qty 1

## 2023-02-13 MED ORDER — ATORVASTATIN CALCIUM 20 MG PO TABS
40.0000 mg | ORAL_TABLET | Freq: Every day | ORAL | Status: DC
  Administered 2023-02-13 – 2023-02-19 (×7): 40 mg via ORAL
  Filled 2023-02-13 (×7): qty 2

## 2023-02-13 NOTE — Progress Notes (Signed)
Patient Name: Thomas [name]           DOB: [birth year]  MRN: [id number]      Admission Date: 02/12/2023  Attending Provider: Tresa Moore, MD  Primary Diagnosis: Rhabdomyolysis   Level of care: Telemetry Medical   CODE STATUS Clarification   Notified by RN for request to clarify CODE STATUS.  [name] is currently FULL CODE, and is requesting to be made DNR CODE.  Patient is alert and oriented 2, capable of making medical decisions for himself. All questions and concerns regarding his care have been answered. During this conversation, son in law  were present.   I have discussed with the patient and family at great length the difference between full code and DNR.  We discussed the aggressive measures that occurred as a full code.  The patient understands that although aggressive measures may sustain life, his quality of life may not be the same as before  Based on our conversation, I will change patient CODE STATUS to DNR/DNI:   If the patient were to have cardiac arrest, the patient does not wish for medical staff to intervene with CPR/ACLS If the patient were to have respiratory distress or arrest, the patient does not wish for mechanical ventilation.    CPR -              No Defibrillation -  No ACLS -            No Ventilator -       No BiPAP -            Yes Vasopressors - Yes     Donnie Mesa NP [facility] Cross Cover 7pm-7am - check amion for availability Pager [phone]

## 2023-02-13 NOTE — Evaluation (Signed)
Speech Language Pathology Evaluation Patient Details Name: Thomas [name] MRN: [id number] DOB: [birth year] Today's Date: 02/13/2023 Time: 7829-5621 SLP Time Calculation (min) (ACUTE ONLY): 18 min  Problem List:  Patient Active Problem List   Diagnosis Date Noted   Rhabdomyolysis 02/12/2023   Hypophosphatemia 05/22/2021   Traumatic rhabdomyolysis (HCC)    Neuropathy    Rhabdomyolysis due to COVID-19 05/19/2021   Weakness    Acute respiratory failure with hypoxia (HCC)    Insomnia    Acute metabolic encephalopathy    COVID-19 virus infection 05/28/2020   GERD (gastroesophageal reflux disease)    Hypertension    HLD (hyperlipidemia)    Multiple lung nodules 12/05/2018   Mass of right side of neck 01/27/2018   Past Medical History:  Past Medical History:  Diagnosis Date   Arthritis    Carpal tunnel syndrome on both sides    GERD (gastroesophageal reflux disease)    Hyperlipidemia    Hypertension    Past Surgical History:  Past Surgical History:  Procedure Laterality Date   INGUINAL HERNIA REPAIR  2001   REPLACEMENT TOTAL KNEE Bilateral    HPI:  89 y.o. male with medical history significant of essential hypertension, GERD who lives alone who was brought to the ED due to generalized weakness.  Found down at home.  Patient lives alone and family checks on him and brings in meals.  Last seen approximately 631-day prior to admission when they took him to dinner and he was in his usual state of health.  Returned the next morning approximately 1 PM to find him on the bathroom floor.  Patient is confused and unable to provide accurate history.  MRI IMPRESSION:  1. 6 mm acute nonhemorrhagic infarct involving the superior right thalamus. 2. 7 mm acute/subacute nonhemorrhagic infarct involving the right centrum semi ovale. 3. 4 mm subcortical acute infarct in the right corona radiata. 4. Multiple remote lacunar infarcts involving the basal ganglia, thalami and bilateral cerebellum. 5. Advanced  atrophy and diffuse white matter disease likely reflects the sequela of chronic microvascular ischemia.   Assessment / Plan / Recommendation Clinical Impression  Pt presents with worsening cognitive abilities in the setting of multiple strokes and suspected baseline chronic dementia. At baseline, pt resides alone with support from his family for medication management, bill paying, appointments, they bring him breakfast and supper while he makes himself a light snadwich at lunch. During this evaluation he presents with acute mild to moderate left facial weakness that doesn't impact his speech intelligibility. In addition, pt presents with severe cognitive impairment, especially in the domain of memory and to a lesser degree attention to task. Pt's daughter present and confirms these deficits to be acute in nature.  The "[employer] Mental Status" (SLUMS) Examination was administered. Pt scored 4/30, raising concern for the presence of a neurocognitive disorder. Further testing would be beneficial, as deficits of attention, memory, oriantion, problem solving, and executive functions identified today may negatively impact pt safety with independent living.   SLUMS Examination Orientation  1/3  Numeric Problem Solving  0/3  Memory  0/5  Attention 0/2  Thought Organization 1/3  Clock Drawing 0/4  Visuospatial Skills               2/2  Short Story Recall  0/8  Total  4/30     Scoring  High School Education  Less than High School Education   Normal  27-30 25-30  Mild Neurocognitive Disorder 21-26 20-24  Dementia  1-20 1-19   Throughout the assessment, pt was distracted by basic general items in the room, this writer's badge and intermittently touching the bedside table. In addition, pt's memory deficits are very pronounced within basic tasks as his responses are tangential and confabulatory in nature. Would recommend pt have increased supervision at discharge with possibility of  rehabilitation at discharge. All questions answered to daughter's satisfaction.     SLP Assessment  SLP Recommendation/Assessment: All further Speech Lanaguage Pathology  needs can be addressed in the next venue of care SLP Visit Diagnosis: Cognitive communication deficit (R41.841)    Recommendations for follow up therapy are one component of a multi-disciplinary discharge planning process, led by the attending physician.  Recommendations may be updated based on patient status, additional functional criteria and insurance authorization.    Follow Up Recommendations  Skilled nursing-short term rehab (<3 hours/day)    Assistance Recommended at Discharge  Frequent or constant Supervision/Assistance  Functional Status Assessment Patient has had a recent decline in their functional status and/or demonstrates limited ability to make significant improvements in function in a reasonable and predictable amount of time  Frequency and Duration     N/A      SLP Evaluation Cognition  Overall Cognitive Status: Impaired/Different from baseline Arousal/Alertness: Awake/alert Orientation Level: Oriented to person Year: 2022 Day of Week: Incorrect Attention: Sustained Sustained Attention: Impaired Sustained Attention Impairment: Verbal basic;Functional basic Memory: Impaired Memory Impairment: Storage deficit;Retrieval deficit;Decreased recall of new information;Decreased short term memory;Prospective memory Decreased Short Term Memory: Verbal basic;Functional basic Awareness: Impaired Awareness Impairment: Intellectual impairment Problem Solving: Impaired Problem Solving Impairment: Verbal basic;Functional basic Executive Function:  (all impaired by lower level deficits) Behaviors: Confabulation;Restless Safety/Judgment: Impaired       Comprehension  Auditory Comprehension Overall Auditory Comprehension: Appears within functional limits for tasks assessed Yes/No Questions: Within Functional  Limits Commands: Within Functional Limits Conversation: Simple Interfering Components: Attention;Working Radio broadcast assistant: Repetition    Expression Expression Primary Mode of Expression: Verbal Verbal Expression Overall Verbal Expression: Appears within functional limits for tasks assessed Initiation: No impairment Automatic Speech: Name;Social Response Level of Generative/Spontaneous Verbalization: Sentence Repetition: No impairment Naming: No impairment Pragmatics: No impairment Non-Verbal Means of Communication: Not applicable Written Expression Dominant Hand: Right Written Expression: Not tested   Oral / Motor  Oral Motor/Sensory Function Overall Oral Motor/Sensory Function: Moderate impairment Facial ROM: Reduced left Facial Symmetry: Abnormal symmetry left Facial Strength: Reduced left Facial Sensation: Reduced left Lingual ROM: Reduced left Lingual Symmetry: Within Functional Limits Lingual Strength: Within Functional Limits Lingual Sensation: Within Functional Limits Velum: Within Functional Limits Mandible: Within Functional Limits Motor Speech Overall Motor Speech: Appears within functional limits for tasks assessed Respiration: Within functional limits Phonation: Normal Resonance: Within functional limits Articulation: Within functional limitis Intelligibility: Intelligible Motor Planning: Witnin functional limits Motor Speech Errors: Not applicable           Ettore Trebilcock B. Dreama Saa, M.S., CCC-SLP, Tree surgeon Certified Brain Injury Specialist [facility]  [facility] Rehabilitation Services Office [phone] Ascom [phone] Fax [phone]

## 2023-02-13 NOTE — Progress Notes (Signed)
   02/13/23 1000  Spiritual Encounters  Type of Visit Initial  Care provided to: Pt and family  Referral source Nurse (RN/NT/LPN)  Reason for visit Advance directives  OnCall Visit Yes   Chaplain responded to [facility] consult to provide and explain HCPOA paperwork. Family understands to have Chaplain paged to have document notarized and completed.

## 2023-02-13 NOTE — Progress Notes (Addendum)
PT Cancellation Note  Patient Details Name: Thomas [name] MRN: [id number] DOB: 05/05/1931   Cancelled Treatment:    Reason Eval/Treat Not Completed: Other (comment) (Patient triaged due to high volume and will be prioritized for eval tomorrow AM )  Luretha Murphy. Ilsa Iha, PT, DPT 02/13/23, 4:59 PM

## 2023-02-13 NOTE — Evaluation (Signed)
Occupational Therapy Evaluation Patient Details Name: Thomas [name] MRN: [id number] DOB: May 20, 1930 Today's Date: 02/13/2023   History of Present Illness 89 y.o. male with medical history significant of essential hypertension, GERD who lives alone who was brought to the ED due to generalized weakness.  Found down at home.  Patient lives alone and family checks on him and brings in meals.  Last seen approximately 631-day prior to admission when they took him to dinner and he was in his usual state of health.  Returned the next morning approximately 1 PM to find him on the bathroom floor.  Patient is confused and unable to provide accurate history.  MRI IMPRESSION:  1. 6 mm acute nonhemorrhagic infarct involving the superior right thalamus. 2. 7 mm acute/subacute nonhemorrhagic infarct involving the right centrum semi ovale. 3. 4 mm subcortical acute infarct in the right corona radiata. 4. Multiple remote lacunar infarcts involving the basal ganglia, thalami and bilateral cerebellum. 5. Advanced atrophy and diffuse white matter disease likely reflects the sequela of chronic microvascular ischemia   Clinical Impression   Pt in bed- willing to work with OT- son-in-law with pt -upon attempting to get patient up noticed patient IV on left arm coming out causing some bleeding.  Nursing called and IV was taken out and bandaged left arm.  Patient bilateral upper extremities strength within functional limits.  Bilateral hand grip decreased with decreased coordination.  Per patient family has bilateral carpal tunnel and getting shots that do help at times.  Patient max assist in upper body and lower body ADLs.  Because of decreased balance and increased weakness.  Patient was at an using adaptive equipment for socks needed max assist.  Info provided for family.  Bed to chair transfer mod assist by 2 because of patient difficulty with advancing and picking up left lower extremity using rolling walker.  Sit to stand mod  to max by 1.  Patient needed verbal cueing to stand tall leaning forward.  Patient can benefit from skilled OT services to increase sitting/standing balance and functional mobility /transfers in  ADLs as well ADL retraining including adaptive equipment.  Patient can benefit from skilled OT services patient will be appropriate to refer to short-term rehab skilled nursing patient and family open for it..      If plan is discharge home, recommend the following:      Functional Status Assessment  Patient has had a recent decline in their functional status and demonstrates the ability to make significant improvements in function in a reasonable and predictable amount of time.  Equipment Recommendations  BSC/3in1;Tub/shower bench;Toilet riser    Recommendations for Other Services       Precautions / Restrictions Precautions Precautions: Fall Restrictions Weight Bearing Restrictions: No      Mobility Bed Mobility               General bed mobility comments: Supine to sit- EOB up - min A and max v/c    Transfers Overall transfer level: Needs assistance Equipment used: Rolling walker (2 wheels)               General transfer comment: Sit to stand bed raised - mod A  and transfer to chair wtih RW - mod A x 2 for safety - pt diffictuly with moving and picking up L LE - per family since Sept hospitilization      Balance Overall balance assessment: Needs assistance Sitting-balance support: Single extremity supported Sitting balance-[name]: Fair  Standing balance support: Bilateral upper extremity supported (leaning fw) Standing balance-[name]: Poor                             ADL either performed or assessed with clinical judgement   ADL                                         General ADL Comments: Max A for UB bathing and dressing because of sitting balance , LBdressing max A on use of sock aid -but max A - son-in-law ed on  buying on [employer] - eating min A and groomin Min A - toiletting max A - catheter     Vision Baseline Vision/History: 1 Wears glasses Patient Visual Report: No change from baseline       Perception         Praxis         Pertinent Vitals/Pain Pain Assessment Pain Assessment: No/denies pain     Extremity/Trunk Assessment Upper Extremity Assessment Upper Extremity Assessment: Generalized weakness;Right hand dominant (WFL -but has CTS in bilateral hands - get shots by ortho)           Communication Communication Communication: Difficulty communicating thoughts/reduced clarity of speech Following commands: Follows one step commands with increased time   Cognition Arousal: Alert   Overall Cognitive Status: Difficult to assess                                 General Comments: See SLP eval     General Comments       Exercises     Shoulder Instructions      Home Living Family/patient expects to be discharged to:: Private residence Living Arrangements: Alone Available Help at Discharge: Family Type of Home: House             Bathroom Shower/Tub: Engineer, civil (consulting) Toilet: Standard     Home Equipment: Information systems manager      Lives With: Alone    Prior Functioning/Environment               Mobility Comments: Son in law present during eval - up to Sept admision pt was driving and independent          OT Problem List: Decreased strength;Decreased safety awareness;Decreased activity tolerance;Impaired balance (sitting and/or standing);Decreased knowledge of use of DME or AE      OT Treatment/Interventions: Self-care/ADL training;Therapeutic exercise;Neuromuscular education;DME and/or AE instruction;Manual therapy;Balance training;Patient/family education    OT Goals(Current goals can be found in the care plan section) Acute Rehab OT Goals Patient Stated Goal: Going to rehab to get stronger OT Goal Formulation: With  patient/family Time For Goal Achievement: 02/27/23 Potential to Achieve Goals: Good  OT Frequency: Min 2X/week    Co-evaluation              AM-PAC OT "6 Clicks" Daily Activity     Outcome Measure Help from another person eating meals?: A Little Help from another person taking care of personal grooming?: A Little Help from another person toileting, which includes using toliet, bedpan, or urinal?: Total Help from another person bathing (including washing, rinsing, drying)?: A Lot Help from another person to put on and taking off regular upper body clothing?: A Lot Help from another  person to put on and taking off regular lower body clothing?: A Lot 6 Click Score: 13   End of Session Equipment Utilized During Treatment: Gait belt;Rolling walker (2 wheels) Nurse Communication: Mobility status  Activity Tolerance: Patient tolerated treatment well;Patient limited by fatigue Patient left: in chair;with chair alarm set;with family/visitor present  OT Visit Diagnosis: Unsteadiness on feet (R26.81);Other abnormalities of gait and mobility (R26.89);Other symptoms and signs involving the nervous system (R29.898);Repeated falls (R29.6);Muscle weakness (generalized) (M62.81);History of falling (Z91.81)                Time: 4098-1191 OT Time Calculation (min): 45 min Charges:  OT General Charges $OT Visit: 1 Visit   Oletta Cohn OTR/L,CLT 02/13/2023, 3:13 PM

## 2023-02-13 NOTE — Consult Note (Signed)
NEUROLOGY CONSULT NOTE   Date of service: February 13, 2023 Patient Name: Thomas [name] MRN:  [id number] DOB:  18-Jan-1931 Chief Complaint: "Left sided weakness" Requesting Provider: Tresa Moore, MD  History of Present Illness  Thomas [name] is a 89 y.o. male  has a past medical history of Arthritis, Carpal tunnel syndrome on both sides, GERD (gastroesophageal reflux disease), Hyperlipidemia, and Hypertension. who presents with a fall and was found to have an ischemic stroke.  His family states that he has not been quite the same since mid September.  They have also notes that his been dragging his left leg.  Due to the symptoms, the admitting team got an MRI which demonstrates several subcortical strokes.   LKW: Mid-September IV Thrombolysis:   No outside window,  EVT: No outside window   Past History   Past Medical History:  Diagnosis Date   Arthritis    Carpal tunnel syndrome on both sides    GERD (gastroesophageal reflux disease)    Hyperlipidemia    Hypertension     Past Surgical History:  Procedure Laterality Date   INGUINAL HERNIA REPAIR  2001   REPLACEMENT TOTAL KNEE Bilateral     Family History: Family History  Problem Relation Age of Onset   Cancer Sister        Liver cancer   Melanoma Sister    Cancer Brother        Pancreatic   Cancer Brother        Lung cancer   Heart disease Brother     Social History  reports that he has never smoked. He has never used smokeless tobacco. He reports that he does not drink alcohol and does not use drugs.  No Known Allergies  Medications   Current Facility-Administered Medications:    acetaminophen (TYLENOL) tablet 650 mg, 650 mg, Oral, Q6H PRN **OR** acetaminophen (TYLENOL) suppository 650 mg, 650 mg, Rectal, Q6H PRN, Sreenath, Sudheer B, MD   albuterol (PROVENTIL) (2.5 MG/3ML) 0.083% nebulizer solution 2.5 mg, 2.5 mg, Nebulization, Q2H PRN, Georgeann Oppenheim, Sudheer B, MD   aspirin tablet 325 mg, 325 mg, Oral, Daily,  Manuela Schwartz, NP, 325 mg at 02/13/23 1023   clopidogrel (PLAVIX) tablet 75 mg, 75 mg, Oral, Daily, Manuela Schwartz, NP, 75 mg at 02/13/23 1023   enoxaparin (LOVENOX) injection 40 mg, 40 mg, Subcutaneous, Q24H, Sreenath, Sudheer B, MD, 40 mg at 02/13/23 0020   ondansetron (ZOFRAN) tablet 4 mg, 4 mg, Oral, Q6H PRN **OR** ondansetron (ZOFRAN) injection 4 mg, 4 mg, Intravenous, Q6H PRN, Sreenath, Sudheer B, MD   oxyCODONE (Oxy IR/ROXICODONE) immediate release tablet 5 mg, 5 mg, Oral, Q4H PRN, Sreenath, Sudheer B, MD   pantoprazole (PROTONIX) EC tablet 40 mg, 40 mg, Oral, Daily, Sreenath, Sudheer B, MD, 40 mg at 02/13/23 1023   senna (SENOKOT) tablet 8.6 mg, 1 tablet, Oral, QHS PRN, Sreenath, Sudheer B, MD   traZODone (DESYREL) tablet 50 mg, 50 mg, Oral, QHS, Sreenath, Sudheer B, MD  Vitals   Vitals:   02/12/23 2359 02/13/23 0414 02/13/23 0814 02/13/23 1214  BP: 122/87 (!) 172/67 136/73 121/87  Pulse: 73 89 81 90  Resp: 18 18    Temp: 98.6 F (37 C) 98.4 F (36.9 C) 98.6 F (37 C) 97.9 F (36.6 C)  TempSrc:   Oral Oral  SpO2: 96% 96% 95% 97%    There is no height or weight on file to calculate BMI.  Physical Exam   Constitutional: Appears elderly  Neurologic  Examination     Labs   CBC:  Recent Labs  Lab 02/12/23 1400 02/13/23 0540  WBC 10.4 8.1  NEUTROABS 9.1*  --   HGB 15.8 14.4  HCT 47.1 43.4  MCV 89.9 90.6  PLT 237 206    Basic Metabolic Panel:  Lab Results  Component Value Date   NA 136 02/13/2023   K 3.6 02/13/2023   CO2 22 02/13/2023   GLUCOSE 94 02/13/2023   BUN 13 02/13/2023   CREATININE 0.99 02/13/2023   CALCIUM 8.6 (L) 02/13/2023   GFRNONAA >60 02/13/2023   GFRAA >60 10/28/2011   Lipid Panel:  Lab Results  Component Value Date   LDLCALC 87 02/13/2023   HgbA1c:  Lab Results  Component Value Date   HGBA1C 5.4 02/12/2023   Urine Drug Screen: No results found for: "LABOPIA", "COCAINSCRNUR", "LABBENZ", "AMPHETMU", "THCU", "LABBARB"   Alcohol Level     Component Value Date/Time   ETH <10 02/12/2023 1400   INR  Lab Results  Component Value Date   INR 1.2 02/13/2023   APTT  Lab Results  Component Value Date   APTT 31 02/02/2018    MRI Brain(Personally reviewed): Several subcortical strokes.  Impression   Thomas [name] is a 89 y.o. male with multiple subcortical small vessel appearing strokes. I suspect this is secondary to concurrent small vessel disease as opposed to embolic phenomenon.  I would, however, still like telemetry and echo to assess for possible embolic sources.  Primarily, secondary prevention will be management of small vessel risk factors.  He is already over 2 weeks in, but given the multiple strokes and possibility of large vessel atherois playing a role, I would favor a longer duration of DAPT  Recommendations  - Aspirin 81 mg and Plavix 75 mg daily for 90 days from September 16 - Echo, telemetry - LDL is 87, I would therefore recommend more aggressive statin therapy with atorvastatin 40 mg daily -PT, OT, ST ______________________________________________________________________    Stormy Card

## 2023-02-13 NOTE — Plan of Care (Signed)
Max assist including education with adaptive equipment

## 2023-02-13 NOTE — Progress Notes (Signed)
PROGRESS NOTE    Thomas [name]  VQQ:[id number] DOB: Jan 20, 1931 DOA: 02/12/2023 PCP: Barbette Reichmann, MD    Brief Narrative:   89 y.o. male with medical history significant of essential hypertension, GERD who lives alone who was brought to the ED due to generalized weakness.  Found down at home.  Patient lives alone and family checks on him and brings in meals.  Last seen approximately 631-day prior to admission when they took him to dinner and he was in his usual state of health.  Returned the next morning approximately 1 PM to find him on the bathroom floor.  Patient is confused and unable to provide accurate history.  Does deny pain.   On initial presentation patient is hemodynamically stable.  He has some scattered bruises but is not endorsing any pain.  Laboratory investigation significant for mildly elevated troponin to 134.  EKG nonischemic.  Patient has evidence of rhabdomyolysis.  Relatively mild.  10/5: MRI positive for multiple acute infarcts.  Suspect cardioembolic origin.  Neurology consulted.   Assessment & Plan:   Principal Problem:   Rhabdomyolysis  Acute CVA MRI confirmed multifocal infarcts.  Suspicious for cardioembolic etiology. Plan: Neurology consult DAPT aspirin Plavix Echo with bubble study PT OT SLP May benefit from placement  Acute encephalopathy Likely secondary to acute infarct Background of cognitive decline/dementia Plan: Frequent orienting measures Delirium precautions  Rhabdomyolysis Found down PT OT and SLP consults If CK trending down and patient tolerating p.o. can discontinue fluids  Elevated troponin Suspect supply/demand ischemia in the setting of acute infarct Troponin downtrending Telemetry monitor  Essential hypertension Hold blood pressure medications for now  GERD PPI    DVT prophylaxis: SQ Lovenox Code Status: DNR Family Communication: Daughter at bedside 10/5 Disposition Plan: Status is: Observation The patient  will require care spanning > 2 midnights and should be moved to inpatient because: Acute CVA   Level of care: Telemetry Medical  Consultants:  Neurology  Procedures:  None  Antimicrobials: None   Subjective: Seen and examined.  RN reports poor sleep.  Patient resting comfortably in bed.  No visible distress.  Daughter at bedside.  Objective: Vitals:   02/12/23 2203 02/12/23 2359 02/13/23 0414 02/13/23 0814  BP: 139/84 122/87 (!) 172/67 136/73  Pulse: 75 73 89 81  Resp: 17 18 18    Temp: 98.7 F (37.1 C) 98.6 F (37 C) 98.4 F (36.9 C) 98.6 F (37 C)  TempSrc: Oral   Oral  SpO2: 100% 96% 96% 95%    Intake/Output Summary (Last 24 hours) at 02/13/2023 0952 Last data filed at 02/13/2023 0554 Gross per 24 hour  Intake 2003.63 ml  Output 300 ml  Net 1703.63 ml   There were no vitals filed for this visit.  Examination:  General exam: NAD Respiratory system: Clear to auscultation. Respiratory effort normal. Cardiovascular system: S1 S2, RRR, no murmurs, no pedal edema Gastrointestinal system: Abdomen is nondistended, soft and nontender. No organomegaly or masses felt. Normal bowel sounds heard. Central nervous system:.  Oriented to person.  Mild left-sided weakness Extremities: Left side weakness Skin: No rashes, lesions or ulcers Psychiatry: Judgement and insight appear impaired. Mood & affect confused.     Data Reviewed: I have personally reviewed following labs and imaging studies  CBC: Recent Labs  Lab 02/12/23 1400 02/13/23 0540  WBC 10.4 8.1  NEUTROABS 9.1*  --   HGB 15.8 14.4  HCT 47.1 43.4  MCV 89.9 90.6  PLT 237 206   Basic Metabolic Panel:  Recent Labs  Lab 02/12/23 1400 02/13/23 0540  NA 138 136  K 3.9 3.6  CL 105 106  CO2 23 22  GLUCOSE 126* 94  BUN 16 13  CREATININE 1.09 0.99  CALCIUM 9.3 8.6*   GFR: CrCl cannot be calculated (Unknown ideal weight.). Liver Function Tests: Recent Labs  Lab 02/12/23 1400 02/13/23 0540  AST 47*  44*  ALT 22 22  ALKPHOS 80 65  BILITOT 1.0 1.4*  PROT 7.8 6.7  ALBUMIN 4.2 3.6   Recent Labs  Lab 02/12/23 1400  LIPASE 21   No results for input(s): "AMMONIA" in the last 168 hours. Coagulation Profile: Recent Labs  Lab 02/13/23 0540  INR 1.2   Cardiac Enzymes: Recent Labs  Lab 02/12/23 1400  CKTOTAL 1,410*   BNP (last 3 results) No results for input(s): "PROBNP" in the last 8760 hours. HbA1C: Recent Labs    02/12/23 1349  HGBA1C 5.4   CBG: No results for input(s): "GLUCAP" in the last 168 hours. Lipid Profile: Recent Labs    02/13/23 0540  CHOL 157  HDL 48  LDLCALC 87  TRIG 111  CHOLHDL 3.3   Thyroid Function Tests: No results for input(s): "TSH", "T4TOTAL", "FREET4", "T3FREE", "THYROIDAB" in the last 72 hours. Anemia Panel: No results for input(s): "VITAMINB12", "FOLATE", "FERRITIN", "TIBC", "IRON", "RETICCTPCT" in the last 72 hours. Sepsis Labs: Recent Labs  Lab 02/12/23 1349  PROCALCITON <0.10    Recent Results (from the past 240 hour(s))  SARS Coronavirus 2 by RT PCR (hospital order, performed in [facility] hospital lab) *cepheid single result test* Anterior Nasal Swab     Status: None   Collection Time: 02/12/23  3:10 PM   Specimen: Anterior Nasal Swab  Result Value Ref Range Status   SARS Coronavirus 2 by RT PCR NEGATIVE NEGATIVE Final    Comment: Performed at [facility], [address], [place], [place] [address]         Radiology Studies: MR BRAIN WO CONTRAST  Result Date: 02/12/2023 CLINICAL DATA:  Neuro deficit, acute, stroke suspected. Patient was found on the bathroom floor. Family states mild left-sided weakness. EXAM: MRI HEAD WITHOUT CONTRAST TECHNIQUE: Multiplanar, multiecho pulse sequences of the brain and surrounding structures were obtained without intravenous contrast. COMPARISON:  CT head without contrast 02/12/2019 2:15 p.m. FINDINGS: Brain: A 6 mm acute nonhemorrhagic infarct is present within the superior  right thalamus. A lesion in the right centrum semi ovale demonstrates peripheral diffusion changes measuring 7 mm. A subcortical 4 mm acute infarct is present in the right corona radiata. No significant left-sided infarcts scratched at no acute left-sided infarcts are present. Advanced atrophy and diffuse white matter disease is present. Remote lacunar infarcts are present in basal ganglia bilaterally and both cerebellar hemispheres. Acute hemorrhage or mass lesion is present. The ventricles are proportionate to the degree of atrophy. Brainstem is unremarkable. The internal auditory canals are within normal limits. Midline structures are within normal limits. Vascular: Flow is present in the major intracranial arteries. Skull and upper cervical spine: The craniocervical junction is normal. Upper cervical spine is within normal limits. Marrow signal is unremarkable. Sinuses/Orbits: Minimal fluid is present and posterior ethmoid air cells bilaterally. A small left mastoid effusion is present. Bilateral lens replacements are noted. Globes and orbits are otherwise unremarkable. IMPRESSION: 1. 6 mm acute nonhemorrhagic infarct involving the superior right thalamus. 2. 7 mm acute/subacute nonhemorrhagic infarct involving the right centrum semi ovale. 3. 4 mm subcortical acute infarct in the right  corona radiata. 4. Multiple remote lacunar infarcts involving the basal ganglia, thalami and bilateral cerebellum. 5. Advanced atrophy and diffuse white matter disease likely reflects the sequela of chronic microvascular ischemia. Electronically Signed   By: Marin Roberts M.D.   On: 02/12/2023 21:37   CT ANGIO HEAD NECK W WO CM  Result Date: 02/12/2023 CLINICAL DATA:  Patient found down. Deficit, acute, stroke suspected. Left-sided weakness. EXAM: CT ANGIOGRAPHY HEAD AND NECK WITH AND WITHOUT CONTRAST TECHNIQUE: Multidetector CT imaging of the head and neck was performed using the standard protocol during bolus  administration of intravenous contrast. Multiplanar CT image reconstructions and MIPs were obtained to evaluate the vascular anatomy. Carotid stenosis measurements (when applicable) are obtained utilizing NASCET criteria, using the distal internal carotid diameter as the denominator. RADIATION DOSE REDUCTION: This exam was performed according to the departmental dose-optimization program which includes automated exposure control, adjustment of the mA and/or kV according to patient size and/or use of iterative reconstruction technique. CONTRAST:  75mL OMNIPAQUE IOHEXOL 350 MG/ML SOLN COMPARISON:  MR head without contrast 02/12/2023. CT head without contrast 02/12/2023. FINDINGS: CTA NECK FINDINGS Aortic arch: Atherosclerotic calcifications are present at the aortic arch and great vessel origins without focal stenosis greater than 50% relative to the more distal vessel. No aneurysm is present. Right carotid system: The right common carotid artery is somewhat tortuous. No focal stenosis is present. Calcified and noncalcified plaque is present at the right carotid bifurcation and proximal right ICA. The lumen is narrowed to 2.6 mm this compares with a more distal measurement of 6 mm. Mild tortuosity and additional distal mural calcifications are present without other focal stenosis. Left carotid system: The left common carotid artery demonstrates some mural calcification without focal stenosis. Dense calcifications are present at the left carotid bifurcation and proximal left ICA without focal stenosis. Moderate tortuosity is present in the cervical left ICA without focal stenosis. Vertebral arteries: The right vertebral artery is the dominant vessel. Both vertebral arteries originate from the subclavian arteries without significant stenosis. No significant stenosis is present either vertebral artery in the neck. Skeleton: Multilevel degenerative changes are present in the cervical spine. Slight anterolisthesis is  present at C3-4 and C4-5. No focal osseous lesions are present. The patient is edentulous. Other neck: A heterogeneous right level 3 and level 4 neck mass is well circumscribed measuring 4.1 x 3.5 x 3.5 cm. Other focal cervical lesion is present. No significant cervical adenopathy is present. The submandibular and parotid glands and ducts are within normal limits. Upper chest: Mild dependent atelectasis is present both lung apices. Thoracic inlet is within normal limits. Review of the MIP images confirms the above findings CTA HEAD FINDINGS Anterior circulation: Atherosclerotic calcifications are present within the cavernous internal carotid arteries bilaterally without focal stenosis. The ICA termini are within normal limits bilaterally. The left A1 segment is aplastic. An azygous right A2 segment fills from the right. M1 segments are normal bilaterally. The MCA bifurcations are within normal limits. MCA branch vessels are normal bilaterally. Distal ACA branch vessels are within normal limits. Posterior circulation: Atherosclerotic calcifications are present at the dural margin of the left vertebral artery. The PICA origins are visualized and normal. The vertebrobasilar junction and basilar artery are normal. Basilar artery terminates at the superior cerebellar arteries. Bilateral fetal type posterior cerebral arteries are present. Moderate proximal P2 segment stenoses are present. Tandem more high-grade P2 segment stenoses are present bilaterally, more proximal on the left. A high-grade stenosis of the inferior left P3  segment is noted. The distal PCA branch vessels opacify bilaterally. Venous sinuses: The dural sinuses are patent. The straight sinus and deep cerebral veins are intact. Cortical veins are within normal limits. No significant vascular malformation is evident. Anatomic variants: None Review of the MIP images confirms the above findings IMPRESSION: 1. Moderate proximal P2 segment stenoses bilaterally.  This may correspond with the acute right thalamic infarct. 2. Tandem more high-grade P2 segment stenoses bilaterally, more proximal on the left. 3. High-grade stenosis of the inferior left P3 segment. 4. Atherosclerotic changes at the carotid bifurcations bilaterally and cavernous internal carotid arteries bilaterally without focal stenosis. 5. 4.1 x 3.5 x 3.5 cm heterogeneous right level 3 and level 4 neck mass. The lesion is well-circumscribed. Differential diagnosis includes a schwannoma. Other nerve sheath tumors are considered as well. This is somewhat inferior for paraganglioma. Malignancy is considered less likely. 6. Multilevel spondylosis of the cervical spine. 7.  Aortic Atherosclerosis (ICD10-I70.0). These results were called by telephone at the time of interpretation on 02/12/2023 at 9:27 pm to provider [facility], who verbally acknowledged these results. Electronically Signed   By: Marin Roberts M.D.   On: 02/12/2023 21:28   DG Humerus Left  Result Date: 02/12/2023 CLINICAL DATA:  Arm pain after fall EXAM: LEFT HUMERUS - 2+ VIEW COMPARISON:  None Available. FINDINGS: No fracture or dislocation. Soft tissues are unremarkable. Probable calcific tendinopathy at the humeral head IMPRESSION: No acute osseous abnormality Electronically Signed   By: Jasmine Pang M.D.   On: 02/12/2023 15:59   DG Chest 1 View  Result Date: 02/12/2023 CLINICAL DATA:  Pain left chest wall EXAM: CHEST  1 VIEW COMPARISON:  01/25/2023 FINDINGS: No acute airspace disease or effusion. Stable cardiomediastinal silhouette with aortic atherosclerosis. No pneumothorax. IMPRESSION: No active disease. Electronically Signed   By: Jasmine Pang M.D.   On: 02/12/2023 15:58   DG Hip Unilat W or Wo Pelvis 2-3 Views Left  Result Date: 02/12/2023 CLINICAL DATA:  Left hip pain after fall EXAM: DG HIP (WITH OR WITHOUT PELVIS) 2-3V LEFT COMPARISON:  None Available. FINDINGS: SI joint are non widened. Pubic symphysis and rami  appear intact. No fracture or malalignment. Mild hip degenerative change IMPRESSION: No acute osseous abnormality. Electronically Signed   By: Jasmine Pang M.D.   On: 02/12/2023 15:57   CT Head Wo Contrast  Result Date: 02/12/2023 CLINICAL DATA:  Head trauma found on bathroom floor EXAM: CT HEAD WITHOUT CONTRAST CT CERVICAL SPINE WITHOUT CONTRAST TECHNIQUE: Multidetector CT imaging of the head and cervical spine was performed following the standard protocol without intravenous contrast. Multiplanar CT image reconstructions of the cervical spine were also generated. RADIATION DOSE REDUCTION: This exam was performed according to the departmental dose-optimization program which includes automated exposure control, adjustment of the mA and/or kV according to patient size and/or use of iterative reconstruction technique. COMPARISON:  CT brain 01/25/2023, CT brain and cervical spine 05/19/2021 FINDINGS: CT HEAD FINDINGS Brain: No acute territorial infarction, hemorrhage or intracranial mass. Atrophy and extensive chronic small vessel ischemic changes of the white matter. Small chronic cerebellar infarcts. Chronic lacunar infarcts within the thalamus and basal ganglia. Stable ventricle size. Vascular: No hyperdense vessels.  Carotid vascular calcification Skull: Normal. Negative for fracture or focal lesion. Sinuses/Orbits: Mild mucosal thickening in the sinuses. Incompletely visualized expansile lytic lesion within the anterior maxilla, this measures 2.4 cm. Other: None CT CERVICAL SPINE FINDINGS Alignment: Reversal of cervical lordosis. Trace anterolisthesis C3 on C4, C4 on C5 and C7  on T1. Trace retrolisthesis C5 on C6. Facet alignment is within normal limits Skull base and vertebrae: No acute fracture. No primary bone lesion or focal pathologic process. Soft tissues and spinal canal: No prevertebral fluid or swelling. No visible canal hematoma. Disc levels: Advanced multilevel degenerative change. Severe disc  space narrowing C4-C5, C5-C6, C6-C7 and C7-T1. Facet degenerative changes at multiple levels with foraminal narrowing Upper chest: Lung apices are clear. 3.8 x 3.6 cm right supraclavicular mass, stable to slightly enlarged, correlate with prior biopsy results. Other: None IMPRESSION: 1. No CT evidence for acute intracranial abnormality. Atrophy and extensive chronic small vessel ischemic changes of the white matter. 2. Reversal of cervical lordosis with advanced multilevel degenerative change. No acute osseous abnormality. 3. 3.8 cm right supraclavicular mass, stable to slightly enlarged. Correlate with prior biopsy results 4. Incompletely visualized expansile lytic lesion within the anterior maxilla. This may be evaluated with nonemergent facial CT and potential oral surgery consultation. Electronically Signed   By: Jasmine Pang M.D.   On: 02/12/2023 15:56   CT Cervical Spine Wo Contrast  Result Date: 02/12/2023 CLINICAL DATA:  Head trauma found on bathroom floor EXAM: CT HEAD WITHOUT CONTRAST CT CERVICAL SPINE WITHOUT CONTRAST TECHNIQUE: Multidetector CT imaging of the head and cervical spine was performed following the standard protocol without intravenous contrast. Multiplanar CT image reconstructions of the cervical spine were also generated. RADIATION DOSE REDUCTION: This exam was performed according to the departmental dose-optimization program which includes automated exposure control, adjustment of the mA and/or kV according to patient size and/or use of iterative reconstruction technique. COMPARISON:  CT brain 01/25/2023, CT brain and cervical spine 05/19/2021 FINDINGS: CT HEAD FINDINGS Brain: No acute territorial infarction, hemorrhage or intracranial mass. Atrophy and extensive chronic small vessel ischemic changes of the white matter. Small chronic cerebellar infarcts. Chronic lacunar infarcts within the thalamus and basal ganglia. Stable ventricle size. Vascular: No hyperdense vessels.  Carotid  vascular calcification Skull: Normal. Negative for fracture or focal lesion. Sinuses/Orbits: Mild mucosal thickening in the sinuses. Incompletely visualized expansile lytic lesion within the anterior maxilla, this measures 2.4 cm. Other: None CT CERVICAL SPINE FINDINGS Alignment: Reversal of cervical lordosis. Trace anterolisthesis C3 on C4, C4 on C5 and C7 on T1. Trace retrolisthesis C5 on C6. Facet alignment is within normal limits Skull base and vertebrae: No acute fracture. No primary bone lesion or focal pathologic process. Soft tissues and spinal canal: No prevertebral fluid or swelling. No visible canal hematoma. Disc levels: Advanced multilevel degenerative change. Severe disc space narrowing C4-C5, C5-C6, C6-C7 and C7-T1. Facet degenerative changes at multiple levels with foraminal narrowing Upper chest: Lung apices are clear. 3.8 x 3.6 cm right supraclavicular mass, stable to slightly enlarged, correlate with prior biopsy results. Other: None IMPRESSION: 1. No CT evidence for acute intracranial abnormality. Atrophy and extensive chronic small vessel ischemic changes of the white matter. 2. Reversal of cervical lordosis with advanced multilevel degenerative change. No acute osseous abnormality. 3. 3.8 cm right supraclavicular mass, stable to slightly enlarged. Correlate with prior biopsy results 4. Incompletely visualized expansile lytic lesion within the anterior maxilla. This may be evaluated with nonemergent facial CT and potential oral surgery consultation. Electronically Signed   By: Jasmine Pang M.D.   On: 02/12/2023 15:56        Scheduled Meds:   stroke: early stages of recovery book   Does not apply Once   aspirin  325 mg Oral Daily   clopidogrel  75 mg Oral Daily  enoxaparin (LOVENOX) injection  40 mg Subcutaneous Q24H   pantoprazole  40 mg Oral Daily   traZODone  50 mg Oral QHS   Continuous Infusions:   LOS: 0 days      Tresa Moore, MD [facility]   If  7PM-7AM, please contact night-coverage  02/13/2023, 9:52 AM

## 2023-02-14 ENCOUNTER — Inpatient Hospital Stay: Payer: Medicare (Managed Care)

## 2023-02-14 DIAGNOSIS — I639 Cerebral infarction, unspecified: Secondary | ICD-10-CM | POA: Diagnosis not present

## 2023-02-14 LAB — CBC WITH DIFFERENTIAL/PLATELET
Abs Immature Granulocytes: 0.03 10*3/uL (ref 0.00–0.07)
Basophils Absolute: 0 10*3/uL (ref 0.0–0.1)
Basophils Relative: 1 %
Eosinophils Absolute: 0.2 10*3/uL (ref 0.0–0.5)
Eosinophils Relative: 2 %
HCT: 38.9 % — ABNORMAL LOW (ref 39.0–52.0)
Hemoglobin: 13 g/dL (ref 13.0–17.0)
Immature Granulocytes: 1 %
Lymphocytes Relative: 22 %
Lymphs Abs: 1.4 10*3/uL (ref 0.7–4.0)
MCH: 30.3 pg (ref 26.0–34.0)
MCHC: 33.4 g/dL (ref 30.0–36.0)
MCV: 90.7 fL (ref 80.0–100.0)
Monocytes Absolute: 0.5 10*3/uL (ref 0.1–1.0)
Monocytes Relative: 7 %
Neutro Abs: 4.1 10*3/uL (ref 1.7–7.7)
Neutrophils Relative %: 67 %
Platelets: 193 10*3/uL (ref 150–400)
RBC: 4.29 MIL/uL (ref 4.22–5.81)
RDW: 13.9 % (ref 11.5–15.5)
WBC: 6.2 10*3/uL (ref 4.0–10.5)
nRBC: 0 % (ref 0.0–0.2)

## 2023-02-14 LAB — BASIC METABOLIC PANEL
Anion gap: 9 (ref 5–15)
BUN: 15 mg/dL (ref 8–23)
CO2: 23 mmol/L (ref 22–32)
Calcium: 8.7 mg/dL — ABNORMAL LOW (ref 8.9–10.3)
Chloride: 107 mmol/L (ref 98–111)
Creatinine, Ser: 1.11 mg/dL (ref 0.61–1.24)
GFR, Estimated: >60 mL/min (ref >60)
Glucose, Bld: 90 mg/dL (ref 70–99)
Potassium: 4 mmol/L (ref 3.5–5.1)
Sodium: 139 mmol/L (ref 135–145)

## 2023-02-14 MED ORDER — MORPHINE SULFATE (PF) 2 MG/ML IV SOLN
2.0000 mg | INTRAVENOUS | Status: DC | PRN
  Administered 2023-02-14: 2 mg via INTRAVENOUS
  Filled 2023-02-14: qty 1

## 2023-02-14 MED ORDER — SIMETHICONE 80 MG PO CHEW
80.0000 mg | CHEWABLE_TABLET | Freq: Four times a day (QID) | ORAL | Status: DC
  Administered 2023-02-14 – 2023-02-19 (×20): 80 mg via ORAL
  Filled 2023-02-14 (×22): qty 1

## 2023-02-14 NOTE — Progress Notes (Addendum)
Upon entry of room for shift assessment Pt was in obvious crying out grimacing on approach. Pain located in right stomach, Charge RN and MD notified and came to bedside, CT, labs and Morphine.Morphine given per order. Continuing to monitor.

## 2023-02-14 NOTE — Progress Notes (Addendum)
Physical Therapy Evaluation Patient Details Name: Thomas [name] MRN: [id number] DOB: 07/19/1930 Today's Date: 02/14/2023  History of Present Illness  Pt is an 89 y.o. male admitted on 10/4 after being found down on the bathroom floor at home. Patient was confused and unable to provide any accurate history. 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 PMH including but not limited to HTN, GERD.   Clinical Impression  Pt presented supine in bed with HOB elevated, awake and willing to participate in therapy session. Pt's grandson was present throughout session and providing history information. Prior to admission, pt was ambulating with use of a RW and independent with ADLs. Pt lives alone; however, has several family members that live nearby (within 10 min drive). At the time of evaluation, pt requiring mod A for bed mobility and mod A to complete sit<>stand transfers with use of RW. He was also able to sit upright at EOB for ~5 mins with close supervision and 1-2 UE supports. In sitting and standing, pt demonstrating a left lateral trunk lean with the ability to correct with cueing in sitting but not in standing. Pt unable to take any steps during the evaluation today. At the end of the session, PT discussing d/c recommendations for further intensive therapy services (>3 hours of intense daily therapy) with pt's grandson, as pt is highly motivated and demonstrates excellent potential. Pt's grandson reporting that he and the pt's other family members are already expecting pt to need rehab prior to returning  home. Additionally, the pt's children plan to have the pt move in with them following rehab. Pt would continue to benefit from skilled physical therapy services at this time while admitted and after d/c to address the below listed limitations in order to improve overall safety and independence with functional mobility.       If plan is discharge home, recommend the following: Two people to help with walking and/or transfers;A lot of help with bathing/dressing/bathroom;Assistance with cooking/housework;Assistance with feeding;Direct supervision/assist for medications management;Direct supervision/assist for financial management;Assist for transportation;Help with stairs or ramp for entrance;Supervision due to cognitive status   Can travel by private vehicle        Equipment Recommendations Other (comment) (defer to next venue of care)  Recommendations for Other Services  Rehab consult    Functional Status Assessment Patient has had a recent decline in their functional status and demonstrates the ability to make significant improvements in function in a reasonable and predictable amount of time.     Precautions / Restrictions Precautions Precautions: Fall Restrictions Weight Bearing Restrictions: No      Mobility  Bed Mobility Overal bed mobility: Needs Assistance Bed Mobility: Supine to Sit, Sit to Supine     Supine to sit: Mod assist, HOB elevated, Used rails Sit to supine: Max assist, +2 for physical assistance   General bed mobility comments: increased time and effort needed, multimodal cueing for sequencing and technique, assistance needed with trunk management and bilateral LE management off of and back onto bed, use of bed pads to position pt's hips at EOB    Transfers Overall transfer level: Needs assistance Equipment used: Rolling walker (2 wheels) Transfers:  Sit to/from Stand Sit to Stand: Mod assist, From elevated surface           General transfer comment: pt  completing sit<>stand transfers x2 from EOB with bed in an elevated position, mod A to power up into a full standing position with use of momentum to initiate, PT also blocking bilateral feet from sliding forwards    Ambulation/Gait               General Gait Details: pt unable to take steps at this time  Stairs            Wheelchair Mobility     Tilt Bed    Modified Rankin (Stroke Patients Only) Modified Rankin (Stroke Patients Only) Pre-Morbid Rankin Score: No significant disability Modified Rankin: Severe disability     Balance Overall balance assessment: Needs assistance Sitting-balance support: Feet supported, Single extremity supported, Bilateral upper extremity supported Sitting balance-[name]: Poor     Standing balance support: Bilateral upper extremity supported Standing balance-[name]: Poor                               Pertinent Vitals/Pain Pain Assessment Pain Assessment: Faces Faces Pain Scale: Hurts little more Pain Location: lower abdome Pain Descriptors / Indicators: Grimacing, Guarding Pain Intervention(s): Monitored during session, Repositioned    Home Living Family/patient expects to be discharged to:: Private residence Living Arrangements: Alone Available Help at Discharge: Family Type of Home: House           Home Equipment: Shower seat;Rolling Environmental consultant (2 wheels) Additional Comments: pt's grandson present upon eval and providing most of the history information. pt's family is expecting/planning pt to go to rehab prior to returning home. Pt's children have decided pt will then move in with them after d/c from rehab.    Prior Function Prior Level of Function : Independent/Modified Independent;Driving             Mobility Comments: ambulates with RW ADLs Comments: ind     Extremity/Trunk Assessment   Upper Extremity Assessment Upper Extremity Assessment: Defer to OT evaluation    Lower Extremity  Assessment Lower Extremity Assessment: Difficult to assess due to impaired cognition       Communication   Communication Communication: Difficulty communicating thoughts/reduced clarity of speech Following commands: Follows one step commands inconsistently;Follows one step commands with increased time Cueing Techniques: Verbal cues;Tactile cues;Gestural cues;Visual cues  Cognition Arousal: Alert Behavior During Therapy: Flat affect, Lability Overall Cognitive Status: Impaired/Different from baseline Area of Impairment: Orientation, Attention, Memory, Following commands, Safety/judgement, Awareness, Problem solving                 Orientation Level: Disoriented to, Time, Situation Current Attention Level: Sustained Memory: Decreased short-term memory Following Commands: Follows one step commands inconsistently, Follows one step commands with increased time Safety/Judgement: Decreased awareness of safety, Decreased awareness of deficits Awareness: Intellectual Problem Solving: Slow processing, Requires verbal cues, Requires tactile cues          General Comments      Exercises     Assessment/Plan    PT Assessment Patient needs continued PT services  PT Problem List Decreased strength;Decreased range of motion;Decreased activity tolerance;Decreased balance;Decreased mobility;Decreased coordination;Decreased cognition;Decreased knowledge of use of DME;Decreased safety awareness;Decreased knowledge of precautions;Pain       PT Treatment Interventions DME instruction;Gait training;Stair training;Functional mobility training;Therapeutic activities;Therapeutic exercise;Balance training;Neuromuscular re-education;Cognitive remediation;Patient/family education    PT Goals (Current goals  can be found in the Care Plan section)  Acute Rehab PT Goals Patient Stated Goal: to get stronger, return to independence PT Goal Formulation: With patient/family Time For Goal Achievement:  02/28/23 Potential to Achieve Goals: Good    Frequency Min 1X/week     Co-evaluation               AM-PAC PT "6 Clicks" Mobility  Outcome Measure Help needed turning from your back to your side while in a flat bed without using bedrails?: A Lot Help needed moving from lying on your back to sitting on the side of a flat bed without using bedrails?: A Lot Help needed moving to and from a bed to a chair (including a wheelchair)?: A Lot Help needed standing up from a chair using your arms (e.g., wheelchair or bedside chair)?: A Lot Help needed to walk in hospital room?: Total Help needed climbing 3-5 steps with a railing? : Total 6 Click Score: 10    End of Session Equipment Utilized During Treatment: Gait belt Activity Tolerance: Patient tolerated treatment well Patient left: in bed;with call bell/phone within reach;with nursing/sitter in room;with family/visitor present;Other (comment) (transport arriving to take pt for CT scan) Nurse Communication: Mobility status PT Visit Diagnosis: Other abnormalities of gait and mobility (R26.89)    Time: 6644-0347 PT Time Calculation (min) (ACUTE ONLY): 42 min   Charges:   PT Evaluation $PT Eval Moderate Complexity: 1 Mod PT Treatments $Therapeutic Activity: 23-37 mins PT General Charges $$ ACUTE PT VISIT: 1 Visit         Arletta Bale, DPT  Acute Rehabilitation Services Office [phone]   Alessandra Bevels Maan Zarcone 02/14/2023, 10:29 AM

## 2023-02-14 NOTE — Progress Notes (Signed)
PROGRESS NOTE    Thomas [name]  NGE:[id number] DOB: 10-21-1930 DOA: 02/12/2023 PCP: Barbette Reichmann, MD    Brief Narrative:   89 y.o. male with medical history significant of essential hypertension, GERD who lives alone who was brought to the ED due to generalized weakness.  Found down at home.  Patient lives alone and family checks on him and brings in meals.  Last seen approximately 631-day prior to admission when they took him to dinner and he was in his usual state of health.  Returned the next morning approximately 1 PM to find him on the bathroom floor.  Patient is confused and unable to provide accurate history.  Does deny pain.   On initial presentation patient is hemodynamically stable.  He has some scattered bruises but is not endorsing any pain.  Laboratory investigation significant for mildly elevated troponin to 134.  EKG nonischemic.  Patient has evidence of rhabdomyolysis.  Relatively mild.  10/5: MRI positive for multiple acute infarcts.  Suspect cardioembolic origin.  Neurology consulted.  10/6: Discussed with neurology.  Etiology of CVA likely ischemic small vessel disease rather than cardioembolic.  On 10/6 AM called to bedside by nursing as patient was in notable pain located to the right upper abdomen.  Labs reassuring.  CT abdomen pelvis without acute findings.  Received morphine and pain improving.   Assessment & Plan:   Principal Problem:   Rhabdomyolysis Active Problems:   Acute CVA (cerebrovascular accident) (HCC)  Acute CVA MRI confirmed multifocal infarcts.  Per neurology likely small vessel ischemic disease Plan: DAPT aspirin Plavix Echo with bubble study, pending PT OT SLP May benefit from placement [facility] consult for IPR placement  Abdominal pain Noted on 10/6 AM.  CT abdomen pelvis without acute findings.  Laboratory workup reassuring.  Suspect possible indigestion/gas pain.  Initiate simethicone 80 mg 4 times daily  Acute encephalopathy Likely  secondary to acute infarct Background of cognitive decline/dementia Plan: Frequent orienting measures Delirium precautions  Rhabdomyolysis Found down PT OT and SLP consults No further IV fluids Encourage p.o. intake   Elevated troponin Suspect supply/demand ischemia in the setting of acute infarct Troponin downtrending Continue telemetry monitoring  Essential hypertension Hold blood pressure medications for now  GERD PPI    DVT prophylaxis: SQ Lovenox Code Status: DNR Family Communication: Daughter at bedside 10/5 Disposition Plan: Status is: Inpatient Remains inpatient appropriate because: Acute CVA.  Will need placement     Level of care: Telemetry Medical  Consultants:  Neurology  Procedures:  None  Antimicrobials: None   Subjective: Seen and examined.  In significant pain this morning.  Still mentating clearly.  Objective: Vitals:   02/13/23 1959 02/13/23 2352 02/14/23 0400 02/14/23 0742  BP: 136/80 125/83 116/66 136/77  Pulse: 71 80 74 70  Resp: 16 16 20 16   Temp: 98 F (36.7 C) 98.1 F (36.7 C) (!) 97.4 F (36.3 C) 97.8 F (36.6 C)  TempSrc:      SpO2: 97% 98% 96% 97%   No intake or output data in the 24 hours ending 02/14/23 1115  There were no vitals filed for this visit.  Examination:  General exam: Distress due to pain Respiratory system: Clear to auscultation. Respiratory effort normal. Cardiovascular system: S1 S2, RRR, no murmurs, no pedal edema Gastrointestinal system: Mild distention, right upper quadrant tenderness, normal bowel sounds Central nervous system:.  Oriented to person.  Mild left-sided weakness Extremities: Left side weakness Skin: No rashes, lesions or ulcers Psychiatry: Judgement and insight appear  impaired. Mood & affect confused.     Data Reviewed: I have personally reviewed following labs and imaging studies  CBC: Recent Labs  Lab 02/12/23 1400 02/13/23 0540 02/14/23 0831  WBC 10.4 8.1 6.2   NEUTROABS 9.1*  --  4.1  HGB 15.8 14.4 13.0  HCT 47.1 43.4 38.9*  MCV 89.9 90.6 90.7  PLT 237 206 193   Basic Metabolic Panel: Recent Labs  Lab 02/12/23 1400 02/13/23 0540 02/14/23 0831  NA 138 136 139  K 3.9 3.6 4.0  CL 105 106 107  CO2 23 22 23   GLUCOSE 126* 94 90  BUN 16 13 15   CREATININE 1.09 0.99 1.11  CALCIUM 9.3 8.6* 8.7*   GFR: CrCl cannot be calculated (Unknown ideal weight.). Liver Function Tests: Recent Labs  Lab 02/12/23 1400 02/13/23 0540  AST 47* 44*  ALT 22 22  ALKPHOS 80 65  BILITOT 1.0 1.4*  PROT 7.8 6.7  ALBUMIN 4.2 3.6   Recent Labs  Lab 02/12/23 1400  LIPASE 21   No results for input(s): "AMMONIA" in the last 168 hours. Coagulation Profile: Recent Labs  Lab 02/13/23 0540  INR 1.2   Cardiac Enzymes: Recent Labs  Lab 02/12/23 1400 02/13/23 0537  CKTOTAL 1,410* 1,152*   BNP (last 3 results) No results for input(s): "PROBNP" in the last 8760 hours. HbA1C: Recent Labs    02/12/23 1349  HGBA1C 5.4   CBG: No results for input(s): "GLUCAP" in the last 168 hours. Lipid Profile: Recent Labs    02/13/23 0540  CHOL 157  HDL 48  LDLCALC 87  TRIG 111  CHOLHDL 3.3   Thyroid Function Tests: No results for input(s): "TSH", "T4TOTAL", "FREET4", "T3FREE", "THYROIDAB" in the last 72 hours. Anemia Panel: No results for input(s): "VITAMINB12", "FOLATE", "FERRITIN", "TIBC", "IRON", "RETICCTPCT" in the last 72 hours. Sepsis Labs: Recent Labs  Lab 02/12/23 1349  PROCALCITON <0.10    Recent Results (from the past 240 hour(s))  SARS Coronavirus 2 by RT PCR (hospital order, performed in [facility] hospital lab) *cepheid single result test* Anterior Nasal Swab     Status: None   Collection Time: 02/12/23  3:10 PM   Specimen: Anterior Nasal Swab  Result Value Ref Range Status   SARS Coronavirus 2 by RT PCR NEGATIVE NEGATIVE Final    Comment: Performed at [facility], [address] [place], [place] [address]          Radiology Studies: CT ABDOMEN PELVIS WO CONTRAST  Result Date: 02/14/2023 CLINICAL DATA:  Acute, nonlocalized abdominal pain EXAM: CT ABDOMEN AND PELVIS WITHOUT CONTRAST TECHNIQUE: Multidetector CT imaging of the abdomen and pelvis was performed following the standard protocol without IV contrast. RADIATION DOSE REDUCTION: This exam was performed according to the departmental dose-optimization program which includes automated exposure control, adjustment of the mA and/or kV according to patient size and/or use of iterative reconstruction technique. COMPARISON:  None Available. FINDINGS: Lower chest: Atheromatous calcification and small to moderate hiatal hernia. Subpleural reticulation in the lower lungs attributed to scarring/fibrosis. Hepatobiliary: No focal liver abnormality.Rounded gas bubbles in the gallbladder which could be within calculi or refluxed. No evidence of acute cholecystitis. Pancreas: Generalized atrophy Spleen: Unremarkable. Adrenals/Urinary Tract: Negative adrenals. Minimal high-density within the collecting system related to recent CTA with continued excretion. There are bilateral renal cystic densities measuring up to 7 cm at the left lower pole where there is a thin benign mural calcification. No follow-up imaging is recommended. Unremarkable bladder. Stomach/Bowel:  No obstruction.  No visible bowel inflammation. Vascular/Lymphatic: No acute vascular abnormality. Extensive atheromatous calcification. Fusiform infrarenal abdominal aorta measuring 3.5 cm in diameter. No mass or adenopathy. Reproductive:Enlarged prostate measuring 5 cm anterior to posterior and projecting into the bladder base. Other: No ascites or pneumoperitoneum. Musculoskeletal: No acute abnormalities. Remote T12 and L1 endplate fractures. Generalized spondylitic spurring. IMPRESSION: 1. No acute finding. 2. Gas bubbles in the gallbladder which could be within calculi or refluxed. No detected right upper  quadrant inflammation. 3. Atherosclerosis with infrarenal abdominal aortic aneurysm measuring up to 3.5 cm. Recommend follow-up ultrasound every 2 years. This recommendation follows ACR consensus guidelines: White Paper of the ACR Incidental Findings Committee II on Vascular Findings. J Am Coll Radiol 2013; 10:789-794. Electronically Signed   By: Tiburcio Pea M.D.   On: 02/14/2023 10:56   MR BRAIN WO CONTRAST  Result Date: 02/12/2023 CLINICAL DATA:  Neuro deficit, acute, stroke suspected. Patient was found on the bathroom floor. Family states mild left-sided weakness. EXAM: MRI HEAD WITHOUT CONTRAST TECHNIQUE: Multiplanar, multiecho pulse sequences of the brain and surrounding structures were obtained without intravenous contrast. COMPARISON:  CT head without contrast 02/12/2019 2:15 p.m. FINDINGS: Brain: A 6 mm acute nonhemorrhagic infarct is present within the superior right thalamus. A lesion in the right centrum semi ovale demonstrates peripheral diffusion changes measuring 7 mm. A subcortical 4 mm acute infarct is present in the right corona radiata. No significant left-sided infarcts scratched at no acute left-sided infarcts are present. Advanced atrophy and diffuse white matter disease is present. Remote lacunar infarcts are present in basal ganglia bilaterally and both cerebellar hemispheres. Acute hemorrhage or mass lesion is present. The ventricles are proportionate to the degree of atrophy. Brainstem is unremarkable. The internal auditory canals are within normal limits. Midline structures are within normal limits. Vascular: Flow is present in the major intracranial arteries. Skull and upper cervical spine: The craniocervical junction is normal. Upper cervical spine is within normal limits. Marrow signal is unremarkable. Sinuses/Orbits: Minimal fluid is present and posterior ethmoid air cells bilaterally. A small left mastoid effusion is present. Bilateral lens replacements are noted. Globes and  orbits are otherwise unremarkable. IMPRESSION: 1. 6 mm acute nonhemorrhagic infarct involving the superior right thalamus. 2. 7 mm acute/subacute nonhemorrhagic infarct involving the right centrum semi ovale. 3. 4 mm subcortical acute infarct in the right corona radiata. 4. Multiple remote lacunar infarcts involving the basal ganglia, thalami and bilateral cerebellum. 5. Advanced atrophy and diffuse white matter disease likely reflects the sequela of chronic microvascular ischemia. Electronically Signed   By: Marin Roberts M.D.   On: 02/12/2023 21:37   CT ANGIO HEAD NECK W WO CM  Result Date: 02/12/2023 CLINICAL DATA:  Patient found down. Deficit, acute, stroke suspected. Left-sided weakness. EXAM: CT ANGIOGRAPHY HEAD AND NECK WITH AND WITHOUT CONTRAST TECHNIQUE: Multidetector CT imaging of the head and neck was performed using the standard protocol during bolus administration of intravenous contrast. Multiplanar CT image reconstructions and MIPs were obtained to evaluate the vascular anatomy. Carotid stenosis measurements (when applicable) are obtained utilizing NASCET criteria, using the distal internal carotid diameter as the denominator. RADIATION DOSE REDUCTION: This exam was performed according to the departmental dose-optimization program which includes automated exposure control, adjustment of the mA and/or kV according to patient size and/or use of iterative reconstruction technique. CONTRAST:  75mL OMNIPAQUE IOHEXOL 350 MG/ML SOLN COMPARISON:  MR head without contrast 02/12/2023. CT head without contrast 02/12/2023. FINDINGS: CTA NECK FINDINGS Aortic arch: Atherosclerotic calcifications are present at the  aortic arch and great vessel origins without focal stenosis greater than 50% relative to the more distal vessel. No aneurysm is present. Right carotid system: The right common carotid artery is somewhat tortuous. No focal stenosis is present. Calcified and noncalcified plaque is present at the  right carotid bifurcation and proximal right ICA. The lumen is narrowed to 2.6 mm this compares with a more distal measurement of 6 mm. Mild tortuosity and additional distal mural calcifications are present without other focal stenosis. Left carotid system: The left common carotid artery demonstrates some mural calcification without focal stenosis. Dense calcifications are present at the left carotid bifurcation and proximal left ICA without focal stenosis. Moderate tortuosity is present in the cervical left ICA without focal stenosis. Vertebral arteries: The right vertebral artery is the dominant vessel. Both vertebral arteries originate from the subclavian arteries without significant stenosis. No significant stenosis is present either vertebral artery in the neck. Skeleton: Multilevel degenerative changes are present in the cervical spine. Slight anterolisthesis is present at C3-4 and C4-5. No focal osseous lesions are present. The patient is edentulous. Other neck: A heterogeneous right level 3 and level 4 neck mass is well circumscribed measuring 4.1 x 3.5 x 3.5 cm. Other focal cervical lesion is present. No significant cervical adenopathy is present. The submandibular and parotid glands and ducts are within normal limits. Upper chest: Mild dependent atelectasis is present both lung apices. Thoracic inlet is within normal limits. Review of the MIP images confirms the above findings CTA HEAD FINDINGS Anterior circulation: Atherosclerotic calcifications are present within the cavernous internal carotid arteries bilaterally without focal stenosis. The ICA termini are within normal limits bilaterally. The left A1 segment is aplastic. An azygous right A2 segment fills from the right. M1 segments are normal bilaterally. The MCA bifurcations are within normal limits. MCA branch vessels are normal bilaterally. Distal ACA branch vessels are within normal limits. Posterior circulation: Atherosclerotic calcifications are  present at the dural margin of the left vertebral artery. The PICA origins are visualized and normal. The vertebrobasilar junction and basilar artery are normal. Basilar artery terminates at the superior cerebellar arteries. Bilateral fetal type posterior cerebral arteries are present. Moderate proximal P2 segment stenoses are present. Tandem more high-grade P2 segment stenoses are present bilaterally, more proximal on the left. A high-grade stenosis of the inferior left P3 segment is noted. The distal PCA branch vessels opacify bilaterally. Venous sinuses: The dural sinuses are patent. The straight sinus and deep cerebral veins are intact. Cortical veins are within normal limits. No significant vascular malformation is evident. Anatomic variants: None Review of the MIP images confirms the above findings IMPRESSION: 1. Moderate proximal P2 segment stenoses bilaterally. This may correspond with the acute right thalamic infarct. 2. Tandem more high-grade P2 segment stenoses bilaterally, more proximal on the left. 3. High-grade stenosis of the inferior left P3 segment. 4. Atherosclerotic changes at the carotid bifurcations bilaterally and cavernous internal carotid arteries bilaterally without focal stenosis. 5. 4.1 x 3.5 x 3.5 cm heterogeneous right level 3 and level 4 neck mass. The lesion is well-circumscribed. Differential diagnosis includes a schwannoma. Other nerve sheath tumors are considered as well. This is somewhat inferior for paraganglioma. Malignancy is considered less likely. 6. Multilevel spondylosis of the cervical spine. 7.  Aortic Atherosclerosis (ICD10-I70.0). These results were called by telephone at the time of interpretation on 02/12/2023 at 9:27 pm to provider [facility], who verbally acknowledged these results. Electronically Signed   By: Marin Roberts M.D.   On:  02/12/2023 21:28   DG Humerus Left  Result Date: 02/12/2023 CLINICAL DATA:  Arm pain after fall EXAM: LEFT HUMERUS -  2+ VIEW COMPARISON:  None Available. FINDINGS: No fracture or dislocation. Soft tissues are unremarkable. Probable calcific tendinopathy at the humeral head IMPRESSION: No acute osseous abnormality Electronically Signed   By: Jasmine Pang M.D.   On: 02/12/2023 15:59   DG Chest 1 View  Result Date: 02/12/2023 CLINICAL DATA:  Pain left chest wall EXAM: CHEST  1 VIEW COMPARISON:  01/25/2023 FINDINGS: No acute airspace disease or effusion. Stable cardiomediastinal silhouette with aortic atherosclerosis. No pneumothorax. IMPRESSION: No active disease. Electronically Signed   By: Jasmine Pang M.D.   On: 02/12/2023 15:58   DG Hip Unilat W or Wo Pelvis 2-3 Views Left  Result Date: 02/12/2023 CLINICAL DATA:  Left hip pain after fall EXAM: DG HIP (WITH OR WITHOUT PELVIS) 2-3V LEFT COMPARISON:  None Available. FINDINGS: SI joint are non widened. Pubic symphysis and rami appear intact. No fracture or malalignment. Mild hip degenerative change IMPRESSION: No acute osseous abnormality. Electronically Signed   By: Jasmine Pang M.D.   On: 02/12/2023 15:57   CT Head Wo Contrast  Result Date: 02/12/2023 CLINICAL DATA:  Head trauma found on bathroom floor EXAM: CT HEAD WITHOUT CONTRAST CT CERVICAL SPINE WITHOUT CONTRAST TECHNIQUE: Multidetector CT imaging of the head and cervical spine was performed following the standard protocol without intravenous contrast. Multiplanar CT image reconstructions of the cervical spine were also generated. RADIATION DOSE REDUCTION: This exam was performed according to the departmental dose-optimization program which includes automated exposure control, adjustment of the mA and/or kV according to patient size and/or use of iterative reconstruction technique. COMPARISON:  CT brain 01/25/2023, CT brain and cervical spine 05/19/2021 FINDINGS: CT HEAD FINDINGS Brain: No acute territorial infarction, hemorrhage or intracranial mass. Atrophy and extensive chronic small vessel ischemic changes  of the white matter. Small chronic cerebellar infarcts. Chronic lacunar infarcts within the thalamus and basal ganglia. Stable ventricle size. Vascular: No hyperdense vessels.  Carotid vascular calcification Skull: Normal. Negative for fracture or focal lesion. Sinuses/Orbits: Mild mucosal thickening in the sinuses. Incompletely visualized expansile lytic lesion within the anterior maxilla, this measures 2.4 cm. Other: None CT CERVICAL SPINE FINDINGS Alignment: Reversal of cervical lordosis. Trace anterolisthesis C3 on C4, C4 on C5 and C7 on T1. Trace retrolisthesis C5 on C6. Facet alignment is within normal limits Skull base and vertebrae: No acute fracture. No primary bone lesion or focal pathologic process. Soft tissues and spinal canal: No prevertebral fluid or swelling. No visible canal hematoma. Disc levels: Advanced multilevel degenerative change. Severe disc space narrowing C4-C5, C5-C6, C6-C7 and C7-T1. Facet degenerative changes at multiple levels with foraminal narrowing Upper chest: Lung apices are clear. 3.8 x 3.6 cm right supraclavicular mass, stable to slightly enlarged, correlate with prior biopsy results. Other: None IMPRESSION: 1. No CT evidence for acute intracranial abnormality. Atrophy and extensive chronic small vessel ischemic changes of the white matter. 2. Reversal of cervical lordosis with advanced multilevel degenerative change. No acute osseous abnormality. 3. 3.8 cm right supraclavicular mass, stable to slightly enlarged. Correlate with prior biopsy results 4. Incompletely visualized expansile lytic lesion within the anterior maxilla. This may be evaluated with nonemergent facial CT and potential oral surgery consultation. Electronically Signed   By: Jasmine Pang M.D.   On: 02/12/2023 15:56   CT Cervical Spine Wo Contrast  Result Date: 02/12/2023 CLINICAL DATA:  Head trauma found on bathroom floor EXAM: CT  HEAD WITHOUT CONTRAST CT CERVICAL SPINE WITHOUT CONTRAST TECHNIQUE:  Multidetector CT imaging of the head and cervical spine was performed following the standard protocol without intravenous contrast. Multiplanar CT image reconstructions of the cervical spine were also generated. RADIATION DOSE REDUCTION: This exam was performed according to the departmental dose-optimization program which includes automated exposure control, adjustment of the mA and/or kV according to patient size and/or use of iterative reconstruction technique. COMPARISON:  CT brain 01/25/2023, CT brain and cervical spine 05/19/2021 FINDINGS: CT HEAD FINDINGS Brain: No acute territorial infarction, hemorrhage or intracranial mass. Atrophy and extensive chronic small vessel ischemic changes of the white matter. Small chronic cerebellar infarcts. Chronic lacunar infarcts within the thalamus and basal ganglia. Stable ventricle size. Vascular: No hyperdense vessels.  Carotid vascular calcification Skull: Normal. Negative for fracture or focal lesion. Sinuses/Orbits: Mild mucosal thickening in the sinuses. Incompletely visualized expansile lytic lesion within the anterior maxilla, this measures 2.4 cm. Other: None CT CERVICAL SPINE FINDINGS Alignment: Reversal of cervical lordosis. Trace anterolisthesis C3 on C4, C4 on C5 and C7 on T1. Trace retrolisthesis C5 on C6. Facet alignment is within normal limits Skull base and vertebrae: No acute fracture. No primary bone lesion or focal pathologic process. Soft tissues and spinal canal: No prevertebral fluid or swelling. No visible canal hematoma. Disc levels: Advanced multilevel degenerative change. Severe disc space narrowing C4-C5, C5-C6, C6-C7 and C7-T1. Facet degenerative changes at multiple levels with foraminal narrowing Upper chest: Lung apices are clear. 3.8 x 3.6 cm right supraclavicular mass, stable to slightly enlarged, correlate with prior biopsy results. Other: None IMPRESSION: 1. No CT evidence for acute intracranial abnormality. Atrophy and extensive chronic  small vessel ischemic changes of the white matter. 2. Reversal of cervical lordosis with advanced multilevel degenerative change. No acute osseous abnormality. 3. 3.8 cm right supraclavicular mass, stable to slightly enlarged. Correlate with prior biopsy results 4. Incompletely visualized expansile lytic lesion within the anterior maxilla. This may be evaluated with nonemergent facial CT and potential oral surgery consultation. Electronically Signed   By: Jasmine Pang M.D.   On: 02/12/2023 15:56        Scheduled Meds:  aspirin EC  81 mg Oral Daily   atorvastatin  40 mg Oral Daily   clopidogrel  75 mg Oral Daily   enoxaparin (LOVENOX) injection  40 mg Subcutaneous Q24H   pantoprazole  40 mg Oral Daily   simethicone  80 mg Oral QID   traZODone  50 mg Oral QHS   Continuous Infusions:   LOS: 1 day      Tresa Moore, MD [facility]   If 7PM-7AM, please contact night-coverage  02/14/2023, 11:15 AM

## 2023-02-15 ENCOUNTER — Inpatient Hospital Stay (HOSPITAL_COMMUNITY)
Admit: 2023-02-15 | Discharge: 2023-02-15 | Disposition: A | Discharge status: Home / Self Care | Payer: Medicare (Managed Care) | Attending: Internal Medicine

## 2023-02-15 DIAGNOSIS — I6389 Other cerebral infarction: Secondary | ICD-10-CM | POA: Diagnosis not present

## 2023-02-15 DIAGNOSIS — T796XXA Traumatic ischemia of muscle, initial encounter: Secondary | ICD-10-CM | POA: Diagnosis not present

## 2023-02-15 LAB — ECHOCARDIOGRAM COMPLETE BUBBLE STUDY
AR max vel: 1.59 cm2
AV Area VTI: 1.81 cm2
AV Area mean vel: 1.6 cm2
AV Mean grad: 8 mm[Hg]
AV Peak grad: 14.1 mm[Hg]
Ao pk vel: 1.88 m/s
Area-P 1/2: 4.89 cm2
MV VTI: 2.07 cm2
S' Lateral: 2.2 cm

## 2023-02-15 LAB — CK: Total CK: 206 U/L (ref 49–397)

## 2023-02-15 NOTE — Progress Notes (Signed)
PROGRESS NOTE    Thomas [name]  ZOX:[id number] DOB: April 06, 1931 DOA: 02/12/2023 PCP: Barbette Reichmann, MD    Brief Narrative:   89 y.o. male with medical history significant of essential hypertension, GERD who lives alone who was brought to the ED due to generalized weakness.  Found down at home.  Patient lives alone and family checks on him and brings in meals.  Last seen approximately 631-day prior to admission when they took him to dinner and he was in his usual state of health.  Returned the next morning approximately 1 PM to find him on the bathroom floor.  Patient is confused and unable to provide accurate history.  Does deny pain.   On initial presentation patient is hemodynamically stable.  He has some scattered bruises but is not endorsing any pain.  Laboratory investigation significant for mildly elevated troponin to 134.  EKG nonischemic.  Patient has evidence of rhabdomyolysis.  Relatively mild.  10/5: MRI positive for multiple acute infarcts.  Suspect cardioembolic origin.  Neurology consulted.  10/6: Discussed with neurology.  Etiology of CVA likely ischemic small vessel disease rather than cardioembolic.  On 10/6 AM called to bedside by nursing as patient was in notable pain located to the right upper abdomen.  Labs reassuring.  CT abdomen pelvis without acute findings.  Received morphine and pain improving.   Assessment & Plan:   Principal Problem:   Rhabdomyolysis Active Problems:   Acute CVA (cerebrovascular accident) (HCC)  Acute CVA MRI confirmed multifocal infarcts.  Per neurology likely small vessel ischemic disease Plan: DAPT aspirin Plavix Echo with bubble study, completed, pending read Continue PT OT SLP May benefit from placement [facility] consult for IPR placement Medically stable for DC  Abdominal pain Noted on 10/6 AM.  CT abdomen pelvis without acute findings.  Laboratory workup reassuring.  Suspect possible indigestion/gas pain.  Initiate simethicone 80 mg 4  times daily  Acute encephalopathy Likely secondary to acute infarct Background of cognitive decline/dementia Suspect concommitant hospital delirium Plan: Frequent orienting measures Delirium precautions  Rhabdomyolysis Found down PT OT and SLP consults No further IV fluids Encourage p.o. intake   Elevated troponin Suspect supply/demand ischemia in the setting of acute infarct Troponin downtrending Continue telemetry monitoring  Essential hypertension Hold blood pressure medications for now  GERD PPI    DVT prophylaxis: SQ Lovenox Code Status: DNR Family Communication: Daughter at bedside 10/5, 10/7 Disposition Plan: Status is: Inpatient Remains inpatient appropriate because: Acute CVA.  Will need placement.  Medically stable for DC     Level of care: Telemetry Medical  Consultants:  Neurology  Procedures:  None  Antimicrobials: None   Subjective: Seen and examined.  In significant pain this morning.  Still mentating clearly.  Objective: Vitals:   02/14/23 1946 02/14/23 2010 02/14/23 2346 02/15/23 0755  BP: (!) 160/81 (!) 154/70 122/80 135/78  Pulse: 82 60 87 96  Resp:    16  Temp: (!) 97.5 F (36.4 C)  98.4 F (36.9 C) 97.8 F (36.6 C)  TempSrc: Oral  Oral   SpO2: 98%  95% 95%    Intake/Output Summary (Last 24 hours) at 02/15/2023 1105 Last data filed at 02/15/2023 0503 Gross per 24 hour  Intake --  Output 400 ml  Net -400 ml    There were no vitals filed for this visit.  Examination:  General exam: NAD.  Confused Respiratory system: Clear to auscultation. Respiratory effort normal. Cardiovascular system: S1 S2, RRR, no murmurs, no pedal edema Gastrointestinal system:  Soft, nontender, nondistended, normal bowel sounds Central nervous system:.  Oriented to person.  Mild left-sided weakness Extremities: Left side weakness Skin: No rashes, lesions or ulcers Psychiatry: Judgement and insight appear impaired. Mood & affect confused.      Data Reviewed: I have personally reviewed following labs and imaging studies  CBC: Recent Labs  Lab 02/12/23 1400 02/13/23 0540 02/14/23 0831  WBC 10.4 8.1 6.2  NEUTROABS 9.1*  --  4.1  HGB 15.8 14.4 13.0  HCT 47.1 43.4 38.9*  MCV 89.9 90.6 90.7  PLT 237 206 193   Basic Metabolic Panel: Recent Labs  Lab 02/12/23 1400 02/13/23 0540 02/14/23 0831  NA 138 136 139  K 3.9 3.6 4.0  CL 105 106 107  CO2 23 22 23   GLUCOSE 126* 94 90  BUN 16 13 15   CREATININE 1.09 0.99 1.11  CALCIUM 9.3 8.6* 8.7*   GFR: CrCl cannot be calculated (Unknown ideal weight.). Liver Function Tests: Recent Labs  Lab 02/12/23 1400 02/13/23 0540  AST 47* 44*  ALT 22 22  ALKPHOS 80 65  BILITOT 1.0 1.4*  PROT 7.8 6.7  ALBUMIN 4.2 3.6   Recent Labs  Lab 02/12/23 1400  LIPASE 21   No results for input(s): "AMMONIA" in the last 168 hours. Coagulation Profile: Recent Labs  Lab 02/13/23 0540  INR 1.2   Cardiac Enzymes: Recent Labs  Lab 02/12/23 1400 02/13/23 0537  CKTOTAL 1,410* 1,152*   BNP (last 3 results) No results for input(s): "PROBNP" in the last 8760 hours. HbA1C: Recent Labs    02/12/23 1349  HGBA1C 5.4   CBG: No results for input(s): "GLUCAP" in the last 168 hours. Lipid Profile: Recent Labs    02/13/23 0540  CHOL 157  HDL 48  LDLCALC 87  TRIG 111  CHOLHDL 3.3   Thyroid Function Tests: No results for input(s): "TSH", "T4TOTAL", "FREET4", "T3FREE", "THYROIDAB" in the last 72 hours. Anemia Panel: No results for input(s): "VITAMINB12", "FOLATE", "FERRITIN", "TIBC", "IRON", "RETICCTPCT" in the last 72 hours. Sepsis Labs: Recent Labs  Lab 02/12/23 1349  PROCALCITON <0.10    Recent Results (from the past 240 hour(s))  SARS Coronavirus 2 by RT PCR (hospital order, performed in [facility] lab) *cepheid single result test* Anterior Nasal Swab     Status: None   Collection Time: 02/12/23  3:10 PM   Specimen: Anterior Nasal Swab  Result Value Ref  Range Status   SARS Coronavirus 2 by RT PCR NEGATIVE NEGATIVE Final    Comment: Performed at [facility], [address] [place], [place] [address]         Radiology Studies: CT ABDOMEN PELVIS WO CONTRAST  Result Date: 02/14/2023 CLINICAL DATA:  Acute, nonlocalized abdominal pain EXAM: CT ABDOMEN AND PELVIS WITHOUT CONTRAST TECHNIQUE: Multidetector CT imaging of the abdomen and pelvis was performed following the standard protocol without IV contrast. RADIATION DOSE REDUCTION: This exam was performed according to the departmental dose-optimization program which includes automated exposure control, adjustment of the mA and/or kV according to patient size and/or use of iterative reconstruction technique. COMPARISON:  None Available. FINDINGS: Lower chest: Atheromatous calcification and small to moderate hiatal hernia. Subpleural reticulation in the lower lungs attributed to scarring/fibrosis. Hepatobiliary: No focal liver abnormality.Rounded gas bubbles in the gallbladder which could be within calculi or refluxed. No evidence of acute cholecystitis. Pancreas: Generalized atrophy Spleen: Unremarkable. Adrenals/Urinary Tract: Negative adrenals. Minimal high-density within the collecting system related to recent CTA with continued excretion. There are bilateral  renal cystic densities measuring up to 7 cm at the left lower pole where there is a thin benign mural calcification. No follow-up imaging is recommended. Unremarkable bladder. Stomach/Bowel:  No obstruction. No visible bowel inflammation. Vascular/Lymphatic: No acute vascular abnormality. Extensive atheromatous calcification. Fusiform infrarenal abdominal aorta measuring 3.5 cm in diameter. No mass or adenopathy. Reproductive:Enlarged prostate measuring 5 cm anterior to posterior and projecting into the bladder base. Other: No ascites or pneumoperitoneum. Musculoskeletal: No acute abnormalities. Remote T12 and L1 endplate fractures.  Generalized spondylitic spurring. IMPRESSION: 1. No acute finding. 2. Gas bubbles in the gallbladder which could be within calculi or refluxed. No detected right upper quadrant inflammation. 3. Atherosclerosis with infrarenal abdominal aortic aneurysm measuring up to 3.5 cm. Recommend follow-up ultrasound every 2 years. This recommendation follows ACR consensus guidelines: White Paper of the ACR Incidental Findings Committee II on Vascular Findings. J Am Coll Radiol 2013; 10:789-794. Electronically Signed   By: Tiburcio Pea M.D.   On: 02/14/2023 10:56        Scheduled Meds:  aspirin EC  81 mg Oral Daily   atorvastatin  40 mg Oral Daily   clopidogrel  75 mg Oral Daily   enoxaparin (LOVENOX) injection  40 mg Subcutaneous Q24H   pantoprazole  40 mg Oral Daily   simethicone  80 mg Oral QID   traZODone  50 mg Oral QHS   Continuous Infusions:   LOS: 2 days      Tresa Moore, MD [facility]   If 7PM-7AM, please contact night-coverage  02/15/2023, 11:05 AM

## 2023-02-15 NOTE — TOC Progression Note (Addendum)
Transition of Care [facility]) - Progression Note    Patient Details  Name: BRONSON BRESSMAN MRN: [id number] Date of Birth: Dec 10, 1930  Transition of Care [facility]) CM/SW Contact  Allena Katz, LCSW Phone Number: 02/15/2023, 11:24 AM  Clinical Narrative:   CIR assessed patient but unable to take pt's insurance. CSW spoke with family at bedside who would like [place] to try [facility] and [facility].  CSW has left voicemail with Theodoro Grist at [facility] to see if he takes patients insurance. Family said neither could take they [facility]   [facility] if other two [facility] unable to take this is familles first choice.        Expected Discharge Plan and Services                                               Social Determinants of Health (SDOH) Interventions SDOH Screenings   Food Insecurity: No Food Insecurity (02/13/2023)  Housing: Low Risk  (02/13/2023)  Transportation Needs: No Transportation Needs (02/13/2023)  Utilities: Not At Risk (02/13/2023)  Financial Resource Strain: Low Risk  (10/12/2022)   Received from [facility]  Tobacco Use: Low Risk  (02/13/2023)    Readmission Risk Interventions     No data to display

## 2023-02-15 NOTE — Progress Notes (Signed)
Physical Therapy Treatment Patient Details Name: Thomas [name] MRN: [id number] DOB: 27-Jun-1930 Today's Date: 02/15/2023   History of Present Illness Pt is an 89 y.o. male admitted on 10/4 after being found down on the bathroom floor at home. Patient was confused and unable to provide any accurate history. 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 PMH including but not limited to HTN, GERD.    PT Comments  Pt sleeping in bed upon PT arrival but easily woken; pt's son in law present.  During session pt max assist semi-supine to sitting EOB; SBA for sitting balance; max assist to stand from bed (plus 2nd assist); pt had difficulty taking steps with RW and 2 assist and max cueing (pt also requiring frequent vc's for upright posture--pt's son in law reports h/o intermittent flexed posture) so pt sat back down on bed with assist; then performed stand step turn with 2 assist bed to recliner.  Will continue to focus on strengthening, balance, and progressive functional mobility per pt tolerance.    If plan is discharge home, recommend the following: Two people to help with walking and/or transfers;A lot of help with bathing/dressing/bathroom;Assistance with cooking/housework;Assistance with feeding;Direct supervision/assist for medications management;Direct supervision/assist for financial management;Assist for transportation;Help with stairs or ramp for entrance;Supervision due to cognitive status   Can travel by private vehicle      No  Equipment Recommendations  Other (comment) (defer to next venue of  care)    Recommendations for Other Services Rehab consult     Precautions / Restrictions Precautions Precautions: Fall Restrictions Weight Bearing Restrictions: No     Mobility  Bed Mobility Overal bed mobility: Needs Assistance Bed Mobility: Supine to Sit     Supine to sit: Max assist, HOB elevated     General bed mobility comments: assist for trunk and B LE's; assist to scoot to EOB using bed sheet; vc's for technique    Transfers Overall transfer level: Needs assistance Equipment used: Rolling walker (2 wheels) Transfers: Sit to/from Stand, Bed to chair/wheelchair/BSC Sit to Stand: Max assist, +2 safety/equipment   Step pivot transfers: Mod assist, +2 physical assistance       General transfer comment: assist to initiate and come to full stand; assist to control descent sitting; 2 assist to take steps bed to recliner (pt taking small steps)    Ambulation/Gait               General Gait Details: not appropriate at this time d/t safety concerns   Stairs             Wheelchair Mobility     Tilt Bed    Modified Rankin (Stroke Patients Only)       Balance Overall balance assessment: Needs assistance Sitting-balance support: No upper extremity supported, Feet supported Sitting balance-[name]: Fair Sitting balance - Comments: steady static sitting   Standing balance support: Bilateral upper extremity supported, Reliant on assistive device for balance Standing balance-[name]: Poor Standing balance comment: assist to balance in standing  Cognition Arousal: Alert Behavior During Therapy: Flat affect Overall Cognitive Status: Impaired/Different from baseline Area of Impairment: Orientation, Attention, Memory, Following commands, Safety/judgement, Awareness, Problem solving                 Orientation Level: Disoriented to, Place, Time (pt able to recall fall and family coming to find  him) Current Attention Level: Sustained Memory: Decreased short-term memory Following Commands: Follows one step commands inconsistently, Follows one step commands with increased time Safety/Judgement: Decreased awareness of safety, Decreased awareness of deficits Awareness: Intellectual Problem Solving: Slow processing, Requires verbal cues, Requires tactile cues          Exercises      General Comments  Nursing cleared pt for participation in physical therapy.  Pt agreeable to PT session.      Pertinent Vitals/Pain Pain Assessment Pain Assessment: Faces Faces Pain Scale: Hurts a little bit Pain Location: lower abdomen Pain Descriptors / Indicators: Aching Pain Intervention(s): Limited activity within patient's tolerance, Monitored during session, Repositioned Vitals (HR and SpO2 on room air) stable and WFL throughout treatment session.    Home Living                          Prior Function            PT Goals (current goals can now be found in the care plan section) Acute Rehab PT Goals Patient Stated Goal: to get stronger, return to independence PT Goal Formulation: With patient/family Time For Goal Achievement: 02/28/23 Potential to Achieve Goals: Good Progress towards PT goals: Progressing toward goals    Frequency    Min 1X/week      PT Plan      Co-evaluation              AM-PAC PT "6 Clicks" Mobility   Outcome Measure  Help needed turning from your back to your side while in a flat bed without using bedrails?: A Lot Help needed moving from lying on your back to sitting on the side of a flat bed without using bedrails?: A Lot Help needed moving to and from a bed to a chair (including a wheelchair)?: Total Help needed standing up from a chair using your arms (e.g., wheelchair or bedside chair)?: Total Help needed to walk in hospital room?: Total Help needed climbing 3-5 steps with a railing? : Total 6 Click Score: 8    End of  Session Equipment Utilized During Treatment: Gait belt Activity Tolerance: Patient tolerated treatment well Patient left: in chair;with call bell/phone within reach;with chair alarm set;with family/visitor present Nurse Communication: Mobility status;Precautions PT Visit Diagnosis: Other abnormalities of gait and mobility (R26.89)     Time: 3664-4034 PT Time Calculation (min) (ACUTE ONLY): 27 min  Charges:    $Therapeutic Activity: 23-37 mins PT General Charges $$ ACUTE PT VISIT: 1 Visit                     Hendricks Limes, PT 02/15/23, 4:24 PM

## 2023-02-15 NOTE — TOC Progression Note (Signed)
Transition of Care [facility]) - Progression Note    Patient Details  Name: Thomas [name] MRN: [id number] Date of Birth: October 21, 1930  Transition [facility] Care [facility]) CM/SW Contact  Allena Katz, LCSW Phone Number: 02/15/2023, 3:55 PM  Clinical Narrative:   CSW confirmed with [facility] that they DO take patients insurance. Referral faxed to [phone].         Expected Discharge Plan and Services                                               Social Determinants of Health (SDOH) Interventions SDOH Screenings   Food Insecurity: No Food Insecurity (02/13/2023)  Housing: Low Risk  (02/13/2023)  Transportation Needs: No Transportation Needs (02/13/2023)  Utilities: Not At Risk (02/13/2023)  Financial Resource Strain: Low Risk  (10/12/2022)   Received from [facility]  Tobacco Use: Low Risk  (02/13/2023)    Readmission Risk Interventions     No data to display

## 2023-02-15 NOTE — Progress Notes (Signed)
Per Dr Sreenath, dc tele monitoring  

## 2023-02-15 NOTE — Progress Notes (Signed)
Inpatient Rehab Admissions Coordinator:   Per therapy recommendations,  patient was screened for CIR candidacy by Megan Salon, MS, CCC-SLP. At this time, Pt. Appears to be a a potential candidate for AIR; however, Pt.'s payor is out of network with [facility]. I will not pursue a consult. Will need to be faxed out to other AIRs or to SNFs.  Please contact me any with questions.  Megan Salon, MS, CCC-SLP Rehab Admissions Coordinator  [phone] (celll) [phone] (office)

## 2023-02-15 NOTE — [place] FL2 (Signed)
[place] MEDICAID FL2 LEVEL OF CARE FORM     IDENTIFICATION  Patient Name: Thomas [name] Birthdate: 1931/01/21 Sex: male Admission Date (Current Location): 02/12/2023  [facility] and [place] Number:  Chiropodist and Address:  [facility], [address] [place], [place] [address]      Provider Number: [id number]  Attending Physician Name and Address:  Tresa Moore, MD  Relative Name and Phone Number:       Current Level of Care: Hospital Recommended Level of Care: Skilled Nursing Facility Prior Approval Number:    Date Approved/Denied:   PASRR Number: [id number]  Discharge Plan: SNF    Current Diagnoses: Patient Active Problem List   Diagnosis Date Noted   Acute CVA (cerebrovascular accident) (HCC) 02/13/2023   Rhabdomyolysis 02/12/2023   Hypophosphatemia 05/22/2021   Traumatic rhabdomyolysis (HCC)    Neuropathy    Rhabdomyolysis due to COVID-19 05/19/2021   Weakness    Acute respiratory failure with hypoxia (HCC)    Insomnia    Acute metabolic encephalopathy    COVID-19 virus infection 05/28/2020   GERD (gastroesophageal reflux disease)    Hypertension    HLD (hyperlipidemia)    Multiple lung nodules 12/05/2018   Mass of right side of neck 01/27/2018    Orientation RESPIRATION BLADDER Height & Weight     Self  Normal Incontinent Weight:   Height:     BEHAVIORAL SYMPTOMS/MOOD NEUROLOGICAL BOWEL NUTRITION STATUS      Continent    AMBULATORY STATUS COMMUNICATION OF NEEDS Skin   Extensive Assist Verbally  (Pretibial wound)                       Personal Care Assistance Level of Assistance  Bathing, Dressing, Feeding Bathing Assistance: Maximum assistance Feeding assistance: Maximum assistance Dressing Assistance: Maximum assistance     Functional Limitations Info  Sight, Hearing, Speech Sight Info: Adequate Hearing Info: Adequate Speech Info: Adequate    SPECIAL CARE FACTORS FREQUENCY  PT (By  licensed PT), OT (By licensed OT)     PT Frequency: 5 times a week OT Frequency: 5 times a week            Contractures Contractures Info: Not present    Additional Factors Info  Code Status Code Status Info: DNR-limited             Current Medications (02/15/2023):  This is the current hospital active medication list Current Facility-Administered Medications  Medication Dose Route Frequency Provider Last Rate Last Admin   acetaminophen (TYLENOL) tablet 650 mg  650 mg Oral Q6H PRN Lolita Patella B, MD       Or   acetaminophen (TYLENOL) suppository 650 mg  650 mg Rectal Q6H PRN Sreenath, Sudheer B, MD       albuterol (PROVENTIL) (2.5 MG/3ML) 0.083% nebulizer solution 2.5 mg  2.5 mg Nebulization Q2H PRN Lolita Patella B, MD       aspirin EC tablet 81 mg  81 mg Oral Daily Rejeana Brock, MD   81 mg at 02/15/23 1041   atorvastatin (LIPITOR) tablet 40 mg  40 mg Oral Daily Rejeana Brock, MD   40 mg at 02/15/23 1041   clopidogrel (PLAVIX) tablet 75 mg  75 mg Oral Daily Manuela Schwartz, NP   75 mg at 02/15/23 1041   enoxaparin (LOVENOX) injection 40 mg  40 mg Subcutaneous Q24H Lolita Patella B, MD   40 mg at 02/14/23 2225   morphine (  PF) 2 MG/ML injection 2 mg  2 mg Intravenous Q3H PRN Lolita Patella B, MD   2 mg at 02/14/23 0817   ondansetron (ZOFRAN) tablet 4 mg  4 mg Oral Q6H PRN Lolita Patella B, MD       Or   ondansetron (ZOFRAN) injection 4 mg  4 mg Intravenous Q6H PRN Sreenath, Sudheer B, MD       oxyCODONE (Oxy IR/ROXICODONE) immediate release tablet 5 mg  5 mg Oral Q4H PRN Sreenath, Sudheer B, MD       pantoprazole (PROTONIX) EC tablet 40 mg  40 mg Oral Daily Georgeann Oppenheim, Sudheer B, MD   40 mg at 02/15/23 1041   senna (SENOKOT) tablet 8.6 mg  1 tablet Oral QHS PRN Lolita Patella B, MD       simethicone (MYLICON) chewable tablet 80 mg  80 mg Oral QID Lolita Patella B, MD   80 mg at 02/15/23 1042   traZODone (DESYREL) tablet 50 mg  50 mg Oral  QHS Lolita Patella B, MD   50 mg at 02/14/23 2225     Discharge Medications: Please see discharge summary for a list of discharge medications.  Relevant Imaging Results:  Relevant Lab Results:   Additional Information [id number]  Allena Katz, LCSW

## 2023-02-15 NOTE — Progress Notes (Signed)
Occupational Therapy Treatment Patient Details Name: Thomas [name] MRN: [id number] DOB: Jun 01, 1930 Today's Date: 02/15/2023   History of present illness Pt is an 89 y.o. male admitted on 10/4 after being found down on the bathroom floor at home. Patient was confused and unable to provide any accurate history. 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 PMH including but not limited to HTN, GERD.   OT comments  Pt is seated in recliner with son in law present on entry to room. Pt pleasant and agreeable to OT session. Pt mentions soreness in his lower abdomen from multiple tests/moving around yesterday. Pt performed x2 STS from recliner to RW today with max standing tolerance of 1 min with cueing throughout for upright posture as pt has forward flexed trunk in standing. He needed Mod A and verb cues for STS for hand placement as he prefers to pull up from RW, reminders to reach back for chair as he sits versus plopping. Pt also required Mod A for scooting forward and backward in recliner. Pt is eager to get better and participates well. Pt returned to recliner with all needs in place and will cont to require skilled acute OT services and would greatly benefit from IPR services to maximize his safety and IND to return to PLOF/living alone.      If plan is discharge home, recommend the following:  A lot of help with walking and/or transfers;A lot of help with bathing/dressing/bathroom;Supervision due to cognitive status;Direct supervision/assist for financial management;Assistance with  cooking/housework;Assist for transportation;Help with stairs or ramp for entrance;Direct supervision/assist for medications management   Equipment Recommendations  BSC/3in1;Tub/shower bench;Toilet riser    Recommendations for Other Services      Precautions / Restrictions Precautions Precautions: Fall Restrictions Weight Bearing Restrictions: No       Mobility Bed Mobility                    Transfers Overall transfer level: Needs assistance Equipment used: Rolling walker (2 wheels) Transfers: Sit to/from Stand Sit to Stand: Mod assist (from recliner)           General transfer comment: Pt performed x2 STS from recliner to RW today with max standing tolerance of 1 min with cueing throughout for upright posture as pt has forward flexed trunk in standing. He needed Mod A and verb cues for STS for hand placement as he prefers to pull up from RW, reminders to reach back for chair as he sits.     Balance Overall balance assessment: Needs assistance Sitting-balance support: No upper extremity supported, Feet supported Sitting balance-[name]: Fair     Standing balance support: Bilateral upper extremity supported, Reliant on assistive device for balance Standing balance-[name]: Poor Standing balance comment: needs assist to maintain balance in standing                           ADL either performed or assessed with clinical judgement   ADL Overall ADL's : Needs assistance/impaired  General ADL Comments: pt has poor standing balance, seated balance in recliner seemed improved today with attempts to forward scoot and ability to maintain balance, but still likely to need assist with most all ADLs    Extremity/Trunk Assessment Upper Extremity Assessment Upper Extremity Assessment: Generalized weakness            Vision Baseline Vision/History: 1 Wears glasses Patient Visual Report: No  change from baseline     Perception     Praxis      Cognition Arousal: Alert Behavior During Therapy: Flat affect Overall Cognitive Status: Impaired/Different from baseline Area of Impairment: Orientation, Attention, Memory, Following commands, Safety/judgement, Awareness, Problem solving                 Orientation Level: Disoriented to, Place, Time (pt able to recall fall and family coming to find him) Current Attention Level: Sustained Memory: Decreased short-term memory Following Commands: Follows one step commands inconsistently, Follows one step commands with increased time Safety/Judgement: Decreased awareness of safety, Decreased awareness of deficits Awareness: Intellectual Problem Solving: Slow processing, Requires verbal cues, Requires tactile cues          Exercises      Shoulder Instructions       General Comments      Pertinent Vitals/ Pain       Pain Assessment Pain Assessment: Faces Faces Pain Scale: Hurts a little bit Pain Location: lower abdomen Pain Descriptors / Indicators: Sore Pain Intervention(s): Monitored during session, Limited activity within patient's tolerance  Home Living                                          Prior Functioning/Environment              Frequency  Min 1X/week        Progress Toward Goals  OT Goals(current goals can now be found in the care plan section)  Progress towards OT goals: Progressing toward goals  Acute Rehab OT Goals Patient Stated Goal: go to IPR to maximize strength and balance to eventually return home OT Goal Formulation: With patient/family Time For Goal Achievement: 02/27/23 Potential to Achieve Goals: Good  Plan      Co-evaluation                 AM-PAC OT "6 Clicks" Daily Activity     Outcome Measure   Help from another person eating meals?: A Little Help from another person taking care of personal grooming?: A Little Help from another person  toileting, which includes using toliet, bedpan, or urinal?: Total Help from another person bathing (including washing, rinsing, drying)?: A Lot Help from another person to put on and taking off regular upper body clothing?: A Lot Help from another person to put on and taking off regular lower body clothing?: A Lot 6 Click Score: 13    End of Session Equipment Utilized During Treatment: Gait belt;Rolling walker (2 wheels)  OT Visit Diagnosis: Unsteadiness on feet (R26.81);Other abnormalities of gait and mobility (R26.89);Other symptoms and signs involving the nervous system (R29.898);Repeated falls (R29.6);Muscle weakness (generalized) (M62.81);History of falling (Z91.81)   Activity Tolerance Patient tolerated treatment well;Patient limited by fatigue   Patient Left in chair;with chair alarm set;with family/visitor present   Nurse Communication Mobility status        Time: 5638-7564 OT Time Calculation (min): 18 min  Charges: OT General Charges $  OT Visit: 1 Visit OT Treatments $Therapeutic Activity: 8-22 mins  Raneisha Bress, OTR/L 02/15/23, 5:04 PM   Oksana Deberry E Yoel Kaufhold 02/15/2023, 5:00 PM

## 2023-02-16 DIAGNOSIS — T796XXA Traumatic ischemia of muscle, initial encounter: Secondary | ICD-10-CM | POA: Diagnosis not present

## 2023-02-16 LAB — BASIC METABOLIC PANEL
Anion gap: 5 (ref 5–15)
BUN: 16 mg/dL (ref 8–23)
CO2: 24 mmol/L (ref 22–32)
Calcium: 8.8 mg/dL — ABNORMAL LOW (ref 8.9–10.3)
Chloride: 110 mmol/L (ref 98–111)
Creatinine, Ser: 1 mg/dL (ref 0.61–1.24)
GFR, Estimated: >60 mL/min (ref >60)
Glucose, Bld: 91 mg/dL (ref 70–99)
Potassium: 3.7 mmol/L (ref 3.5–5.1)
Sodium: 139 mmol/L (ref 135–145)

## 2023-02-16 LAB — MAGNESIUM: Magnesium: 2.2 mg/dL (ref 1.7–2.4)

## 2023-02-16 NOTE — Progress Notes (Signed)
Occupational Therapy Treatment Patient Details Name: Thomas [name] MRN: [id number] DOB: Apr 25, 1931 Today's Date: 02/16/2023   History of present illness Pt is an 89 y.o. male admitted on 10/4 after being found down on the bathroom floor at home. Patient was confused and unable to provide any accurate history. 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 PMH including but not limited to HTN, GERD.   OT comments  Chart reviewed prior to tx. Pt seen for OT treatment on this date. Upon arrival to room pt seated in recliner, agreeable to tx. Pt daughter arrived during tx. Tx session targeted improved ADL activity tolerance. Pt requires MOD A + 2 physical assistance for STS with RW. Pt performed x2 STS from recliner to RW, standing tolerance of 2 min with tact/verbal cueing throughout for upright posture. Noted Pt forward flexed trunk in standing. Pt completed seated grooming task to improve static standing skills. Pt washed his face with a cloth requiring a verbal cue initiate task. Pt making good progress toward goals, will continue to follow POC. Discharge recommendation remains appropriate. OT will follow acutely.        If plan is discharge home, recommend the following:  A lot of help with walking and/or transfers;A lot of help with bathing/dressing/bathroom;Supervision due to cognitive status;Direct supervision/assist for financial management;Assistance with cooking/housework;Assist for transportation;Help with stairs or ramp for entrance;Direct supervision/assist for medications management    Equipment Recommendations  BSC/3in1;Tub/shower bench;Toilet riser    Recommendations for Other Services      Precautions / Restrictions Precautions Precautions: Fall Restrictions Weight Bearing Restrictions: No       Mobility Bed Mobility               General bed mobility comments: NT pt in recliner pre/post tx    Transfers Overall transfer level: Needs assistance Equipment used: Rolling walker (2 wheels) Transfers: Sit to/from Stand (from recliner) Sit to Stand: Mod assist, +2 physical assistance           General transfer comment: Pt performed x2 STS from recliner to RW today with max standing tolerance of 2 min with cueing throughout for upright posture. Noted Pt forward flexed trunk in standing. He needed MOD A and verb/tact cues for STS for hand placement on walker.     Balance Overall balance assessment: Needs assistance Sitting-balance support: No upper extremity supported, Feet supported Sitting balance-[name]: Fair Sitting balance - Comments: Verbal cues to encourage Pt to remain sitting up during tasks, frequently lateral leaning and posterior leaning into chair Postural control: Right lateral lean, Posterior lean Standing balance support: Bilateral upper extremity supported, Reliant on assistive device for balance Standing balance-[name]: Poor Standing balance comment: Needs assistance to maintain balance in standing                           ADL either performed or assessed with clinical judgement   ADL Overall ADL's : Needs assistance/impaired Eating/Feeding: Supervision/ safety Eating/Feeding Details (indicate cue type and reason): Daughter brought in food for Pt Grooming: Wash/dry face;Set up;Sitting Grooming Details (indicate cue type and reason): [place] cues to initate task and  take off glasses                                    Extremity/Trunk Assessment Upper Extremity Assessment Upper Extremity  Assessment: Generalized weakness   Lower Extremity Assessment Lower Extremity Assessment: Generalized weakness        Vision Baseline Vision/History: 1 Wears glasses     Perception     Praxis      Cognition Arousal: Alert Behavior During Therapy: WFL for tasks assessed/performed Overall Cognitive Status: Impaired/Different from baseline Area of Impairment: Following commands, Safety/judgement, Awareness, Problem solving, Attention, Memory                   Current Attention Level: Sustained Memory: Decreased short-term memory Following Commands: Follows one step commands inconsistently, Follows one step commands with increased time Safety/Judgement: Decreased awareness of safety Awareness: Emergent Problem Solving: Slow processing, Requires verbal cues, Requires tactile cues          Exercises Other Exercises Other Exercises: Re edu: Hand placement during t/f with RW, family edu reguarding positioning when sitting    Shoulder Instructions       General Comments R knee bandage intact pre/post session    Pertinent Vitals/ Pain       Pain Assessment Pain Assessment: No/denies pain  Home Living                                          Prior Functioning/Environment              Frequency  Min 1X/week        Progress Toward Goals  OT Goals(current goals can now be found in the care plan section)  Progress towards OT goals: Progressing toward goals  Acute Rehab OT Goals Patient Stated Goal: go to IPR OT Goal Formulation: With patient/family Time For Goal Achievement: 02/27/23 Potential to Achieve Goals: Good  Plan      Co-evaluation                 AM-PAC OT "6 Clicks" Daily Activity     Outcome Measure   Help from another person eating meals?: A Little Help from another person taking care of personal grooming?: A Little Help from another person toileting, which includes using toliet, bedpan, or urinal?:  Total Help from another person bathing (including washing, rinsing, drying)?: A Lot Help from another person to put on and taking off regular upper body clothing?: A Lot Help from another person to put on and taking off regular lower body clothing?: A Lot 6 Click Score: 13    End of Session Equipment Utilized During Treatment: Gait belt;Rolling walker (2 wheels)  OT Visit Diagnosis: Unsteadiness on feet (R26.81);Other abnormalities of gait and mobility (R26.89);Repeated falls (R29.6);Muscle weakness (generalized) (M62.81);History of falling (Z91.81)   Activity Tolerance Patient tolerated treatment well;Patient limited by fatigue   Patient Left in chair;with chair alarm set;with family/visitor present   Nurse Communication Mobility status        Time: 2440-1027 OT Time Calculation (min): 21 min  Charges: OT General Charges $OT Visit: 1 Visit OT Treatments $Therapeutic Activity: 8-22 mins  [employer], OTS

## 2023-02-16 NOTE — Care Management Important Message (Signed)
Important Message  Patient Details  Name: Thomas [name] MRN: [id number] Date of Birth: 1931-01-07   Important Message Given:  N/A - LOS <3 / Initial given by admissions     Olegario Messier A Opie Maclaughlin 02/16/2023, 8:06 AM

## 2023-02-16 NOTE — Progress Notes (Signed)
Physical Therapy Treatment Patient Details Name: Thomas [name] MRN: [id number] DOB: [birth year] Today's Date: 02/16/2023   History of Present Illness Pt is an 89 y.o. male admitted on 10/4 after being found down on the bathroom floor at home. Patient was confused and unable to provide any accurate history. 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 PMH including but not limited to HTN, GERD.    PT Comments  Pt resting in bed upon PT arrival; agreeable to therapy.  Pt reporting no pain throughout session.  During session pt max assist semi-supine to sitting EOB; mod assist x2 to stand from bed; mod assist x2 to ambulate a few feet bed to recliner with RW use (increased effort/time to take steps); and max assist to stand from recliner x2 trials.  Pt demonstrating flexed posture at times during session (but less than yesterday and responded well to cueing to correct).  Will continue to focus on strengthening, balance, and progressive functional mobility during hospitalization.    If plan is discharge home, recommend the following: Two people to help with walking and/or transfers;A lot of help with bathing/dressing/bathroom;Assistance with cooking/housework;Assistance with feeding;Direct supervision/assist for medications management;Direct supervision/assist for financial management;Assist for transportation;Help with stairs or ramp for entrance;Supervision due to cognitive status   Can travel by private vehicle      No  Equipment Recommendations  Other (comment) (defer to next venue of care)     Recommendations for Other Services       Precautions / Restrictions Precautions Precautions: Fall Restrictions Weight Bearing Restrictions: No     Mobility  Bed Mobility Overal bed mobility: Needs Assistance Bed Mobility: Supine to Sit     Supine to sit: Max assist, HOB elevated     General bed mobility comments: assist for trunk and B LE's; vc's for technique    Transfers Overall transfer level: Needs assistance Equipment used: Rolling walker (2 wheels) Transfers: Sit to/from Stand Sit to Stand: Max assist, Mod assist, +2 physical assistance           General transfer comment: mod assist x2 to stand from bed x1 trial; max assist x1 to stand from recliner x2 trials; vc's for UE/LE placement/positioning; assist to initiate and come to full stand; assist to control descent sitting    Ambulation/Gait Ambulation/Gait assistance: Mod assist, +2 physical assistance Gait Distance (Feet): 3 Feet (bed to recliner) Assistive device: Rolling walker (2 wheels)   Gait velocity: decreased     General Gait Details: increased effort/time to take steps; occasional flexed posture (improved compared to yesterday but pt responding well to cueing to stand up tall); small/short steps; assist to steady   Stairs             Wheelchair Mobility     Tilt Bed    Modified Rankin (Stroke Patients Only)       Balance Overall balance assessment: Needs assistance Sitting-balance support: No upper extremity supported, Feet supported Sitting balance-[name]: Fair Sitting balance - Comments: steady static sitting   Standing balance support: Bilateral upper extremity supported, Reliant on assistive device for balance Standing balance-[name]: Poor Standing balance comment: assist to maintain balance in standing (  d/t posterior lean at times but able to correct with cueing and a little assist); use of RW                            Cognition Arousal:  Alert Behavior During Therapy: WFL for tasks assessed/performed Overall Cognitive Status: Impaired/Different from baseline Area of Impairment: Following commands, Safety/judgement, Awareness, Problem solving, Attention, Memory                 Orientation Level: Disoriented to, Place, Time (pt reported he was at a hospital in [place]) Current Attention Level: Sustained Memory: Decreased short-term memory Following Commands: Follows one step commands inconsistently, Follows one step commands with increased time Safety/Judgement: Decreased awareness of safety Awareness: Emergent Problem Solving: Slow processing, Requires verbal cues, Requires tactile cues          Exercises      General Comments General comments (skin integrity, edema, etc.): R knee bandage intact pre/post session.  Nursing cleared pt for participation in physical therapy.  Pt agreeable to PT session.      Pertinent Vitals/Pain Pain Assessment Pain Assessment: No/denies pain Pain Intervention(s): Limited activity within patient's tolerance, Monitored during session Vitals (HR and SpO2 on room air) stable and WFL throughout treatment session.    Home Living                          Prior Function            PT Goals (current goals can now be found in the care plan section) Acute Rehab PT Goals Patient Stated Goal: to get stronger, return to independence PT Goal Formulation: With patient Time For Goal Achievement: 02/28/23 Potential to Achieve Goals: Good Progress towards PT goals: Progressing toward goals    Frequency    Min 1X/week      PT Plan      Co-evaluation              AM-PAC PT "6 Clicks" Mobility   Outcome Measure  Help needed turning from your back to your side while in a flat bed without using bedrails?: A Lot Help needed moving from lying on your back to sitting on the side of a flat bed without using bedrails?: A Lot Help needed moving to and from a bed  to a chair (including a wheelchair)?: Total Help needed standing up from a chair using your arms (e.g., wheelchair or bedside chair)?: Total Help needed to walk in hospital room?: Total Help needed climbing 3-5 steps with a railing? : Total 6 Click Score: 8    End of Session Equipment Utilized During Treatment: Gait belt Activity Tolerance: Patient tolerated treatment well Patient left: in chair;with call bell/phone within reach;with chair alarm set Nurse Communication: Mobility status;Precautions PT Visit Diagnosis: Other abnormalities of gait and mobility (R26.89)     Time: 8295-6213 PT Time Calculation (min) (ACUTE ONLY): 28 min  Charges:    $Therapeutic Activity: 23-37 mins PT General Charges $$ ACUTE PT VISIT: 1 Visit                     Hendricks Limes, PT 02/16/23, 12:52 PM

## 2023-02-16 NOTE — Progress Notes (Signed)
PROGRESS NOTE    Thomas [name]  ZOX:[id number] DOB: [birth year] DOA: 02/12/2023 PCP: Barbette Reichmann, MD    Brief Narrative:   89 y.o. male with medical history significant of essential hypertension, GERD who lives alone who was brought to the ED due to generalized weakness.  Found down at home.  Patient lives alone and family checks on him and brings in meals.  Last seen approximately 631-day prior to admission when they took him to dinner and he was in his usual state of health.  Returned the next morning approximately 1 PM to find him on the bathroom floor.  Patient is confused and unable to provide accurate history.  Does deny pain.   On initial presentation patient is hemodynamically stable.  He has some scattered bruises but is not endorsing any pain.  Laboratory investigation significant for mildly elevated troponin to 134.  EKG nonischemic.  Patient has evidence of rhabdomyolysis.  Relatively mild.  10/5: MRI positive for multiple acute infarcts.  Suspect cardioembolic origin.  Neurology consulted.  10/6: Discussed with neurology.  Etiology of CVA likely ischemic small vessel disease rather than cardioembolic.  On 10/6 AM called to bedside by nursing as patient was in notable pain located to the right upper abdomen.  Labs reassuring.  CT abdomen pelvis without acute findings.  Received morphine and pain improving.   Assessment & Plan:   Principal Problem:   Rhabdomyolysis Active Problems:   Acute CVA (cerebrovascular accident) (HCC)  Acute CVA MRI confirmed multifocal infarcts.  Per neurology likely small vessel ischemic disease Plan: DAPT aspirin Plavix Echo with bubble study, completed, negative Continue PT OT SLP May benefit from placement [facility] consult for IPR placement Medically stable for DC  Abdominal pain Noted on 10/6 AM.  CT abdomen pelvis without acute findings.  Laboratory workup reassuring.  Suspect possible indigestion/gas pain.  Initiate simethicone 80 mg 4  times daily.  Diet as tolerated.  Acute encephalopathy Likely secondary to acute infarct Background of cognitive decline/dementia Suspect concommitant hospital delirium Plan: Frequent orienting measures Delirium precautions  Rhabdomyolysis Found down PT OT and SLP consults No further IV fluids Encourage p.o. intake Medically ready for discharge to IPR   Elevated troponin Suspect supply/demand ischemia in the setting of acute infarct Troponin downtrending Continue telemetry monitoring  Essential hypertension Hold blood pressure medications for now  GERD PPI    DVT prophylaxis: SQ Lovenox Code Status: DNR Family Communication: Daughter at bedside 10/5, 10/7 Disposition Plan: Status is: Inpatient Remains inpatient appropriate because: Acute CVA.  Will need placement.  Medically stable for DC     Level of care: Telemetry Medical  Consultants:  Neurology  Procedures:  None  Antimicrobials: None   Subjective: Seen and examined.  Eating, no visible distress  Objective: Vitals:   02/15/23 1633 02/15/23 2000 02/16/23 0350 02/16/23 0816  BP: (!) 140/56 (!) 158/91 (!) 149/94 134/69  Pulse: 80 71 72 63  Resp: 17 18  18   Temp: 97.7 F (36.5 C) 98.6 F (37 C) 98 F (36.7 C) 98.2 F (36.8 C)  TempSrc:  Oral Oral   SpO2: 98% 96% 98% 96%    Intake/Output Summary (Last 24 hours) at 02/16/2023 1102 Last data filed at 02/16/2023 1040 Gross per 24 hour  Intake 120 ml  Output 300 ml  Net -180 ml    There were no vitals filed for this visit.  Examination:  General exam: No acute distress, confused Respiratory system: Clear to auscultation. Respiratory effort normal. Cardiovascular system: S1  S2, RRR, no murmurs, no pedal edema Gastrointestinal system: Soft, nontender, nondistended, normal bowel sounds Central nervous system:.  Oriented to person.  Mild left-sided weakness Extremities: Left side weakness Skin: No rashes, lesions or ulcers Psychiatry:  Judgement and insight appear impaired. Mood & affect confused.     Data Reviewed: I have personally reviewed following labs and imaging studies  CBC: Recent Labs  Lab 02/12/23 1400 02/13/23 0540 02/14/23 0831  WBC 10.4 8.1 6.2  NEUTROABS 9.1*  --  4.1  HGB 15.8 14.4 13.0  HCT 47.1 43.4 38.9*  MCV 89.9 90.6 90.7  PLT 237 206 193   Basic Metabolic Panel: Recent Labs  Lab 02/12/23 1400 02/13/23 0540 02/14/23 0831 02/16/23 0818  NA 138 136 139 139  K 3.9 3.6 4.0 3.7  CL 105 106 107 110  CO2 23 22 23 24   GLUCOSE 126* 94 90 91  BUN 16 13 15 16   CREATININE 1.09 0.99 1.11 1.00  CALCIUM 9.3 8.6* 8.7* 8.8*  MG  --   --   --  2.2   GFR: CrCl cannot be calculated (Unknown ideal weight.). Liver Function Tests: Recent Labs  Lab 02/12/23 1400 02/13/23 0540  AST 47* 44*  ALT 22 22  ALKPHOS 80 65  BILITOT 1.0 1.4*  PROT 7.8 6.7  ALBUMIN 4.2 3.6   Recent Labs  Lab 02/12/23 1400  LIPASE 21   No results for input(s): "AMMONIA" in the last 168 hours. Coagulation Profile: Recent Labs  Lab 02/13/23 0540  INR 1.2   Cardiac Enzymes: Recent Labs  Lab 02/12/23 1400 02/13/23 0537 02/15/23 1251  CKTOTAL 1,410* 1,152* 206   BNP (last 3 results) No results for input(s): "PROBNP" in the last 8760 hours. HbA1C: No results for input(s): "HGBA1C" in the last 72 hours.  CBG: No results for input(s): "GLUCAP" in the last 168 hours. Lipid Profile: No results for input(s): "CHOL", "HDL", "LDLCALC", "TRIG", "CHOLHDL", "LDLDIRECT" in the last 72 hours.  Thyroid Function Tests: No results for input(s): "TSH", "T4TOTAL", "FREET4", "T3FREE", "THYROIDAB" in the last 72 hours. Anemia Panel: No results for input(s): "VITAMINB12", "FOLATE", "FERRITIN", "TIBC", "IRON", "RETICCTPCT" in the last 72 hours. Sepsis Labs: Recent Labs  Lab 02/12/23 1349  PROCALCITON <0.10    Recent Results (from the past 240 hour(s))  SARS Coronavirus 2 by RT PCR (hospital order, performed in [facility] hospital lab) *cepheid single result test* Anterior Nasal Swab     Status: None   Collection Time: 02/12/23  3:10 PM   Specimen: Anterior Nasal Swab  Result Value Ref Range Status   SARS Coronavirus 2 by RT PCR NEGATIVE NEGATIVE Final    Comment: Performed at [facility], [address] [place], [place] [address]         Radiology Studies: ECHOCARDIOGRAM COMPLETE BUBBLE STUDY  Result Date: 02/15/2023    ECHOCARDIOGRAM REPORT   Patient Name:   GLENVILLE ESPINA Date of Exam: 02/15/2023 Medical Rec #:  [id number]    Height:       65.0 in Accession #:    [id number]   Weight:       180.8 lb Date of Birth:  December 30, 1930    BSA:          1.895 m Patient Age:    89 years     BP:           122/80 mmHg Patient Gender: M            HR:  PROGRESS NOTE    Thomas [name]  ZOX:[id number] DOB: [birth year] DOA: 02/12/2023 PCP: Barbette Reichmann, MD    Brief Narrative:   89 y.o. male with medical history significant of essential hypertension, GERD who lives alone who was brought to the ED due to generalized weakness.  Found down at home.  Patient lives alone and family checks on him and brings in meals.  Last seen approximately 631-day prior to admission when they took him to dinner and he was in his usual state of health.  Returned the next morning approximately 1 PM to find him on the bathroom floor.  Patient is confused and unable to provide accurate history.  Does deny pain.   On initial presentation patient is hemodynamically stable.  He has some scattered bruises but is not endorsing any pain.  Laboratory investigation significant for mildly elevated troponin to 134.  EKG nonischemic.  Patient has evidence of rhabdomyolysis.  Relatively mild.  10/5: MRI positive for multiple acute infarcts.  Suspect cardioembolic origin.  Neurology consulted.  10/6: Discussed with neurology.  Etiology of CVA likely ischemic small vessel disease rather than cardioembolic.  On 10/6 AM called to bedside by nursing as patient was in notable pain located to the right upper abdomen.  Labs reassuring.  CT abdomen pelvis without acute findings.  Received morphine and pain improving.   Assessment & Plan:   Principal Problem:   Rhabdomyolysis Active Problems:   Acute CVA (cerebrovascular accident) (HCC)  Acute CVA MRI confirmed multifocal infarcts.  Per neurology likely small vessel ischemic disease Plan: DAPT aspirin Plavix Echo with bubble study, completed, negative Continue PT OT SLP May benefit from placement [facility] consult for IPR placement Medically stable for DC  Abdominal pain Noted on 10/6 AM.  CT abdomen pelvis without acute findings.  Laboratory workup reassuring.  Suspect possible indigestion/gas pain.  Initiate simethicone 80 mg 4  times daily.  Diet as tolerated.  Acute encephalopathy Likely secondary to acute infarct Background of cognitive decline/dementia Suspect concommitant hospital delirium Plan: Frequent orienting measures Delirium precautions  Rhabdomyolysis Found down PT OT and SLP consults No further IV fluids Encourage p.o. intake Medically ready for discharge to IPR   Elevated troponin Suspect supply/demand ischemia in the setting of acute infarct Troponin downtrending Continue telemetry monitoring  Essential hypertension Hold blood pressure medications for now  GERD PPI    DVT prophylaxis: SQ Lovenox Code Status: DNR Family Communication: Daughter at bedside 10/5, 10/7 Disposition Plan: Status is: Inpatient Remains inpatient appropriate because: Acute CVA.  Will need placement.  Medically stable for DC     Level of care: Telemetry Medical  Consultants:  Neurology  Procedures:  None  Antimicrobials: None   Subjective: Seen and examined.  Eating, no visible distress  Objective: Vitals:   02/15/23 1633 02/15/23 2000 02/16/23 0350 02/16/23 0816  BP: (!) 140/56 (!) 158/91 (!) 149/94 134/69  Pulse: 80 71 72 63  Resp: 17 18  18   Temp: 97.7 F (36.5 C) 98.6 F (37 C) 98 F (36.7 C) 98.2 F (36.8 C)  TempSrc:  Oral Oral   SpO2: 98% 96% 98% 96%    Intake/Output Summary (Last 24 hours) at 02/16/2023 1102 Last data filed at 02/16/2023 1040 Gross per 24 hour  Intake 120 ml  Output 300 ml  Net -180 ml    There were no vitals filed for this visit.  Examination:  General exam: No acute distress, confused Respiratory system: Clear to auscultation. Respiratory effort normal. Cardiovascular system: S1  S2, RRR, no murmurs, no pedal edema Gastrointestinal system: Soft, nontender, nondistended, normal bowel sounds Central nervous system:.  Oriented to person.  Mild left-sided weakness Extremities: Left side weakness Skin: No rashes, lesions or ulcers Psychiatry:  Judgement and insight appear impaired. Mood & affect confused.     Data Reviewed: I have personally reviewed following labs and imaging studies  CBC: Recent Labs  Lab 02/12/23 1400 02/13/23 0540 02/14/23 0831  WBC 10.4 8.1 6.2  NEUTROABS 9.1*  --  4.1  HGB 15.8 14.4 13.0  HCT 47.1 43.4 38.9*  MCV 89.9 90.6 90.7  PLT 237 206 193   Basic Metabolic Panel: Recent Labs  Lab 02/12/23 1400 02/13/23 0540 02/14/23 0831 02/16/23 0818  NA 138 136 139 139  K 3.9 3.6 4.0 3.7  CL 105 106 107 110  CO2 23 22 23 24   GLUCOSE 126* 94 90 91  BUN 16 13 15 16   CREATININE 1.09 0.99 1.11 1.00  CALCIUM 9.3 8.6* 8.7* 8.8*  MG  --   --   --  2.2   GFR: CrCl cannot be calculated (Unknown ideal weight.). Liver Function Tests: Recent Labs  Lab 02/12/23 1400 02/13/23 0540  AST 47* 44*  ALT 22 22  ALKPHOS 80 65  BILITOT 1.0 1.4*  PROT 7.8 6.7  ALBUMIN 4.2 3.6   Recent Labs  Lab 02/12/23 1400  LIPASE 21   No results for input(s): "AMMONIA" in the last 168 hours. Coagulation Profile: Recent Labs  Lab 02/13/23 0540  INR 1.2   Cardiac Enzymes: Recent Labs  Lab 02/12/23 1400 02/13/23 0537 02/15/23 1251  CKTOTAL 1,410* 1,152* 206   BNP (last 3 results) No results for input(s): "PROBNP" in the last 8760 hours. HbA1C: No results for input(s): "HGBA1C" in the last 72 hours.  CBG: No results for input(s): "GLUCAP" in the last 168 hours. Lipid Profile: No results for input(s): "CHOL", "HDL", "LDLCALC", "TRIG", "CHOLHDL", "LDLDIRECT" in the last 72 hours.  Thyroid Function Tests: No results for input(s): "TSH", "T4TOTAL", "FREET4", "T3FREE", "THYROIDAB" in the last 72 hours. Anemia Panel: No results for input(s): "VITAMINB12", "FOLATE", "FERRITIN", "TIBC", "IRON", "RETICCTPCT" in the last 72 hours. Sepsis Labs: Recent Labs  Lab 02/12/23 1349  PROCALCITON <0.10    Recent Results (from the past 240 hour(s))  SARS Coronavirus 2 by RT PCR (hospital order, performed in [facility] hospital lab) *cepheid single result test* Anterior Nasal Swab     Status: None   Collection Time: 02/12/23  3:10 PM   Specimen: Anterior Nasal Swab  Result Value Ref Range Status   SARS Coronavirus 2 by RT PCR NEGATIVE NEGATIVE Final    Comment: Performed at [facility], [address] [place], [place] [address]         Radiology Studies: ECHOCARDIOGRAM COMPLETE BUBBLE STUDY  Result Date: 02/15/2023    ECHOCARDIOGRAM REPORT   Patient Name:   GLENVILLE ESPINA Date of Exam: 02/15/2023 Medical Rec #:  [id number]    Height:       65.0 in Accession #:    [id number]   Weight:       180.8 lb Date of Birth:  December 30, 1930    BSA:          1.895 m Patient Age:    89 years     BP:           122/80 mmHg Patient Gender: M            HR:

## 2023-02-16 NOTE — TOC Progression Note (Signed)
Transition of Care [facility]) - Progression Note    Patient Details  Name: Thomas [name] MRN: [id number] Date of Birth: 22-Feb-1931  [facility]) CM/SW Contact  Allena Katz, LCSW Phone Number: 02/16/2023, 10:21 AM  Clinical Narrative:   Message left with Theodoro Grist to see if he has had a chance tor review referral for AIR.          Expected Discharge Plan and Services                                               Social Determinants of Health (SDOH) Interventions SDOH Screenings   Food Insecurity: No Food Insecurity (02/13/2023)  Housing: Low Risk  (02/13/2023)  Transportation Needs: No Transportation Needs (02/13/2023)  Utilities: Not At Risk (02/13/2023)  Financial Resource Strain: Low Risk  (10/12/2022)   Received from [facility]  Tobacco Use: Low Risk  (02/13/2023)    Readmission Risk Interventions     No data to display

## 2023-02-16 NOTE — TOC Progression Note (Signed)
Transition of Care [facility]) - Progression Note    Patient Details  Name: Thomas [name] MRN: [id number] Date of Birth: 12/23/1930  Transition of Care [facility]) CM/SW Contact  Allena Katz, LCSW Phone Number: 02/16/2023, 12:32 PM  Clinical Narrative:   [facility] requesting updated notes. CSW to send once PT note from today is in.         Expected Discharge Plan and Services                                               Social Determinants of Health (SDOH) Interventions SDOH Screenings   Food Insecurity: No Food Insecurity (02/13/2023)  Housing: Low Risk  (02/13/2023)  Transportation Needs: No Transportation Needs (02/13/2023)  Utilities: Not At Risk (02/13/2023)  Financial Resource Strain: Low Risk  (10/12/2022)   Received from [facility]  Tobacco Use: Low Risk  (02/13/2023)    Readmission Risk Interventions     No data to display

## 2023-02-16 NOTE — TOC Progression Note (Signed)
Transition of Care [facility]) - Progression Note    Patient Details  Name: Thomas [name] MRN: [id number] Date of Birth: [birth year]  Transition of Care [facility]) CM/SW Contact  Allena Katz, LCSW Phone Number: 02/16/2023, 2:20 PM  Clinical Narrative:   Updated PT notes faxed to [facility] air.         Expected Discharge Plan and Services                                               Social Determinants of Health (SDOH) Interventions SDOH Screenings   Food Insecurity: No Food Insecurity (02/13/2023)  Housing: Low Risk  (02/13/2023)  Transportation Needs: No Transportation Needs (02/13/2023)  Utilities: Not At Risk (02/13/2023)  Financial Resource Strain: Low Risk  (10/12/2022)   Received from [facility]  Tobacco Use: Low Risk  (02/13/2023)    Readmission Risk Interventions     No data to display

## 2023-02-17 DIAGNOSIS — T796XXA Traumatic ischemia of muscle, initial encounter: Secondary | ICD-10-CM | POA: Diagnosis not present

## 2023-02-17 NOTE — Progress Notes (Signed)
Occupational Therapy Treatment Patient Details Name: Thomas [name] MRN: [id number] DOB: [birth year] Today's Date: 02/17/2023   History of present illness Pt is an 89 y.o. male admitted on 10/4 after being found down on the bathroom floor at home. Patient was confused and unable to provide any accurate history. 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 PMH including but not limited to HTN, GERD.   OT comments  Pt chart reviewed prior to tx session. Pt greeted in bed finishing his breakfast. Pt was motivated to work with therapy.  Tx session targeted improved ADL activity tolerance. Pt requires MOD A to t/f sit <> supine, with MAX A to scoot to EOB. STS t/f with RW, Pt requires MOD A + 2 for stability. Pt completed peri care requiring MAX A at standing at EOB using urinal. Pt tolerated standing for 3 minutes during peri care with bilateral RW support + MOD A for stability. Pt took ~4 steps with RW + MOD A +2 with continuous step by step verbal/tactile cues, MAX A to pivot towards recliner. Pt making good progress toward goals, will continue to follow POC. Discharge recommendation remains appropriate.  OT will follow acutely.       If plan is discharge home, recommend the following:  A lot of help with walking and/or transfers;A lot of help with bathing/dressing/bathroom;Supervision due to cognitive status;Direct supervision/assist for financial management;Assistance with cooking/housework;Assist for transportation;Help with stairs or ramp for entrance;Direct supervision/assist for medications  management   Equipment Recommendations  BSC/3in1;Tub/shower bench;Toilet riser    Recommendations for Other Services      Precautions / Restrictions Precautions Precautions: Fall Restrictions Weight Bearing Restrictions: No       Mobility Bed Mobility Overal bed mobility: Needs Assistance Bed Mobility: Supine to Sit     Supine to sit: Mod assist, HOB elevated     General bed mobility comments: Pt left in recliner at end of session    Transfers Overall transfer level: Needs assistance Equipment used: Rolling walker (2 wheels) Transfers: Sit to/from Stand Sit to Stand: Mod assist, +2 physical assistance     Step pivot transfers: Mod assist, +2 physical assistance     General transfer comment: Pt has difficulties scooting to EOB to prep for standing; MAX A to scoot.     Balance Overall balance assessment: Needs assistance Sitting-balance support: No upper extremity supported, Feet supported Sitting balance-[name]: Fair Sitting balance - Comments: steady static sitting imporved today Postural control: Right lateral lean Standing balance support: Bilateral upper extremity supported, Reliant on assistive device for balance Standing balance-[name]: Poor                             ADL either performed or assessed with clinical judgement   ADL Overall ADL's : Needs assistance/impaired Eating/Feeding: Set up;Sitting Eating/Feeding Details (indicate cue type and reason): Assist with coffee cup, needing verbal cue to continue bringing cup to mouth         Lower Body Bathing: Supervison/ safety Lower Body Bathing Details (indicate cue type and reason): Sitting on EOB Upper Body Dressing : Moderate assistance;Cueing for sequencing  Upper Body Dressing Details (indicate cue type and reason): Sitting on EOB - gown     Toilet Transfer: Rolling walker (2 wheels);Maximal assistance Toilet Transfer Details (indicate cue type and reason): Therapist  holding urnal while Pt standing at bed side Toileting- Clothing Manipulation and Hygiene: Supervision/safety Toileting - Clothing Manipulation Details (indicate cue type and reason): Pt cleaned self with cloth at seated on EOB     Functional mobility during ADLs: +2 for physical assistance;Moderate assistance;Rolling walker (2 wheels) General ADL Comments: Pt seated on EOB seemed imporved today, with no attemps to side lean.    Extremity/Trunk Assessment Upper Extremity Assessment Upper Extremity Assessment: Generalized weakness   Lower Extremity Assessment Lower Extremity Assessment: Generalized weakness        Vision Baseline Vision/History: 1 Wears glasses     Perception     Praxis      Cognition Arousal: Alert Behavior During Therapy: WFL for tasks assessed/performed Overall Cognitive Status: Impaired/Different from baseline Area of Impairment: Safety/judgement, Attention, Following commands, Problem solving                 Orientation Level: Disoriented to, Place, Time Current Attention Level: Sustained Memory: Decreased short-term memory Following Commands: Follows one step commands inconsistently, Follows multi-step commands with increased time Safety/Judgement: Decreased awareness of safety   Problem Solving: Slow processing, Requires verbal cues, Requires tactile cues          Exercises Other Exercises Other Exercises: Re edu: Hand placement during t/f with RW, role of OT    Shoulder Instructions       General Comments     Pertinent Vitals/ Pain       Pain Assessment Pain Assessment: No/denies pain  Home Living                                          Prior Functioning/Environment              Frequency  Min 1X/week        Progress Toward Goals  OT Goals(current goals can now be found in the care plan section)  Progress towards OT goals: Progressing toward goals  Acute Rehab OT Goals Patient Stated Goal:  go to IPR OT Goal Formulation: With patient Time For Goal Achievement: 02/27/23 Potential to Achieve Goals: Good  Plan      Co-evaluation                 AM-PAC OT "6 Clicks" Daily Activity     Outcome Measure   Help from another person eating meals?: A Little Help from another person taking care of personal grooming?: A Little Help from another person toileting, which includes using toliet, bedpan, or urinal?: Total Help from another person bathing (including washing, rinsing, drying)?: A Lot Help from another person to put on and taking off regular upper body clothing?: A Lot Help from another person to put on and taking off regular lower body clothing?: A Lot 6 Click Score: 13    End of Session Equipment Utilized During Treatment: Gait belt;Rolling walker (2 wheels)  OT Visit Diagnosis: Unsteadiness on feet (R26.81);Other abnormalities of gait and mobility (R26.89);Repeated falls (R29.6);Muscle weakness (generalized) (M62.81);History of falling (Z91.81)   Activity Tolerance Patient tolerated treatment well   Patient Left in chair;with chair alarm set   Nurse Communication Mobility status        Time: [phone]  OT Time Calculation (min): 32 min  Charges: OT General Charges $OT Visit: 1 Visit OT Treatments $Self Care/Home Management : 8-22 mins $Therapeutic Activity: 8-22 mins  [employer], OTS

## 2023-02-17 NOTE — Progress Notes (Signed)
[facility] Progress Note  Patient: Thomas [name]    ZOX:[id number]  DOA: 02/12/2023     Date of Service: the patient was seen and examined on 02/17/2023  Chief Complaint  Patient presents with   Fall   Brief hospital course: 89 y.o. male with medical history significant of essential hypertension, GERD who lives alone who was brought to the ED due to generalized weakness.  Found down at home.  Patient lives alone and family checks on him and brings in meals.  Last seen approximately 631-day prior to admission when they took him to dinner and he was in his usual state of health.  Returned the next morning approximately 1 PM to find him on the bathroom floor.  Patient is confused and unable to provide accurate history.  Does deny pain.   On initial presentation patient is hemodynamically stable.  He has some scattered bruises but is not endorsing any pain.  Laboratory investigation significant for mildly elevated troponin to 134.  EKG nonischemic.  Patient has evidence of rhabdomyolysis.  Relatively mild.   10/5: MRI positive for multiple acute infarcts.  Suspect cardioembolic origin.  Neurology consulted.   10/6: Discussed with neurology.  Etiology of CVA likely ischemic small vessel disease rather than cardioembolic.  On 10/6 AM called to bedside by nursing as patient was in notable pain located to the right upper abdomen.  Labs reassuring.  CT abdomen pelvis without acute findings.  Received morphine and pain improving.   Assessment and Plan: Acute CVA MRI confirmed multifocal infarcts.  Per neurology likely small vessel ischemic disease Plan: DAPT aspirin Plavix Echo with bubble study, completed, negative Continue PT OT SLP May benefit from placement [facility] consult for IPR placement Medically stable for DC   Abdominal pain Noted on 10/6 AM.  CT abdomen pelvis without acute findings.  Laboratory workup reassuring.  Suspect possible indigestion/gas pain.  Initiate simethicone 80 mg 4  times daily.  Diet as tolerated.   Acute encephalopathy Likely secondary to acute infarct Background of cognitive decline/dementia Suspect concommitant hospital delirium Plan: Frequent orienting measures Delirium precautions   Rhabdomyolysis Found down PT OT and SLP consults No further IV fluids Encourage p.o. intake Medically ready for discharge to IPR     Elevated troponin Suspect supply/demand ischemia in the setting of acute infarct Troponin downtrending Continue telemetry monitoring   Essential hypertension Hold blood pressure medications for now   GERD PPI   There is no height or weight on file to calculate BMI.  Interventions:  Diet: Regular diet DVT Prophylaxis: Subcutaneous Lovenox   Advance goals of care discussion: DNR/Limited  Family Communication: family was not present at bedside, at the time of interview.  The pt provided permission to discuss medical plan with the family. Opportunity was given to ask question and all questions were answered satisfactorily.   Disposition:  Pt is from home, admitted with CVA, stable to discharge to inpatient rehab, insurance Auth is pending. Discharge to inpatient rehab, when insurance Auth will be done.  Subjective: No significant events overnight, patient saw feels weakness in the left lower extremity, denied any other complaints.  Physical Exam: General: NAD, lying comfortably Appear in no distress, affect appropriate Eyes: PERRLA ENT: Oral Mucosa Clear, moist  Neck: no JVD,  Cardiovascular: S1 and S2 Present, no Murmur,  Respiratory: good respiratory effort, Bilateral Air entry equal and Decreased, no Crackles, no wheezes Abdomen: Bowel Sound present, Soft and no tenderness,  Skin: no rashes Extremities: no Pedal edema, no  calf tenderness Neurologic: CN grossly intact, left lower extremity weakness power 4/5.  No any other focal deficits Gait not checked due to patient safety concerns  Vitals:   02/16/23  1704 02/16/23 1943 02/17/23 0444 02/17/23 0800  BP: 117/75 127/84 116/83 132/79  Pulse: (!) 48 65 63 67  Resp: 16  18 16   Temp: 97.8 F (36.6 C) 98.5 F (36.9 C) (!) 97.5 F (36.4 C)   TempSrc: Oral Oral Oral   SpO2: 96% 98% 97% 98%    Intake/Output Summary (Last 24 hours) at 02/17/2023 1422 Last data filed at 02/16/2023 1838 Gross per 24 hour  Intake --  Output 175 ml  Net -175 ml   There were no vitals filed for this visit.  Data Reviewed: I have personally reviewed and interpreted daily labs, tele strips, imagings as discussed above. I reviewed all nursing notes, pharmacy notes, vitals, pertinent old records I have discussed plan of care as described above with RN and patient/family.  CBC: Recent Labs  Lab 02/12/23 1400 02/13/23 0540 02/14/23 0831  WBC 10.4 8.1 6.2  NEUTROABS 9.1*  --  4.1  HGB 15.8 14.4 13.0  HCT 47.1 43.4 38.9*  MCV 89.9 90.6 90.7  PLT 237 206 193   Basic Metabolic Panel: Recent Labs  Lab 02/12/23 1400 02/13/23 0540 02/14/23 0831 02/16/23 0818  NA 138 136 139 139  K 3.9 3.6 4.0 3.7  CL 105 106 107 110  CO2 23 22 23 24   GLUCOSE 126* 94 90 91  BUN 16 13 15 16   CREATININE 1.09 0.99 1.11 1.00  CALCIUM 9.3 8.6* 8.7* 8.8*  MG  --   --   --  2.2    Studies: No results found.  Scheduled Meds:  aspirin EC  81 mg Oral Daily   atorvastatin  40 mg Oral Daily   clopidogrel  75 mg Oral Daily   enoxaparin (LOVENOX) injection  40 mg Subcutaneous Q24H   pantoprazole  40 mg Oral Daily   simethicone  80 mg Oral QID   traZODone  50 mg Oral QHS   Continuous Infusions: PRN Meds: acetaminophen **OR** acetaminophen, albuterol, morphine injection, ondansetron **OR** ondansetron (ZOFRAN) IV, oxyCODONE, senna  Time spent: 35 minutes  Author: Gillis Santa. MD [facility] 02/17/2023 2:22 PM  To reach On-call, see care teams to locate the attending and reach out to them via [email or web address]. If 7PM-7AM, please contact night-coverage If you still  have difficulty reaching the attending provider, please page the [facility] (Director on Call) for [facility] on amion for assistance.

## 2023-02-17 NOTE — TOC Progression Note (Signed)
Transition of Care [facility]) - Progression Note    Patient Details  Name: Thomas [name] MRN: [id number] Date of Birth: 04-09-1931  Transition of [facility]) CM/SW Contact  Allena Katz, LCSW Phone Number: 02/17/2023, 11:29 AM  Clinical Narrative:  Tresa Endo at [facility] reports Berkley Harvey has been started. She hopes to get it this week. She states they do NOT take admissions on weekends. CSW lvm with  with tim brown to give me a call back.          Expected Discharge Plan and Services                                               Social Determinants of Health (SDOH) Interventions SDOH Screenings   Food Insecurity: No Food Insecurity (02/13/2023)  Housing: Low Risk  (02/13/2023)  Transportation Needs: No Transportation Needs (02/13/2023)  Utilities: Not At Risk (02/13/2023)  Financial Resource Strain: Low Risk  (10/12/2022)   Received from [facility]  Tobacco Use: Low Risk  (02/13/2023)    Readmission Risk Interventions     No data to display

## 2023-02-17 NOTE — Progress Notes (Signed)
Physical Therapy Treatment Patient Details Name: Thomas [name] MRN: [id number] DOB: 12/02/1930 Today's Date: 02/17/2023   History of Present Illness Pt is an 89 y.o. male admitted on 10/4 after being found down on the bathroom floor at home. Patient was confused and unable to provide any accurate history. 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 PMH including but not limited to HTN, GERD.    PT Comments  Pt seen for PT tx with pt agreeable. Session focused on transfers, gait & anterior weight shifting. Pt requires max assist for STS 2/2 decreased ability to power to standing & poor ability to shift weight anteriorly. Pt is able to ambulate a short distance in room with RW & mod assist with PT providing chair follow for safety. Continue to recommend ongoing PT treatment to address balance, strengthening, transfers, & gait.    If plan is discharge home, recommend the following: Two people to help with walking and/or transfers;A lot of help with bathing/dressing/bathroom;Assistance with cooking/housework;Assistance with feeding;Direct supervision/assist for medications management;Direct supervision/assist for financial management;Assist for transportation;Help with stairs or ramp for entrance;Supervision due to cognitive status   Can travel by private vehicle        Equipment Recommendations  Other (comment) (defer to next venue)    Recommendations for Other Services       Precautions / Restrictions Precautions Precautions: Fall Restrictions Weight Bearing Restrictions: No      Mobility  Bed Mobility               General bed mobility comments: not tested, pt received & left sitting in recliner    Transfers Overall transfer level: Needs assistance Equipment used: Rolling walker (2 wheels) Transfers: Sit to/from Stand Sit to Stand: Max assist           General transfer comment: STS from recliner with max cuing & assistance to scoot out to edge of seat via lateral leans & shifting hips forwards, cuing for hand placement, foot placement, anterior weight shifting, & powering up to standing. Pt requires max assist for each STS transfer.    Ambulation/Gait Ambulation/Gait assistance: Mod assist Gait Distance (Feet): 5 Feet Assistive device: Rolling walker (2 wheels) Gait Pattern/deviations: Decreased step length - right, Decreased step length - left, Decreased dorsiflexion - right, Decreased dorsiflexion - left, Decreased stride length, Shuffle Gait velocity: decreased     General Gait Details: Pt pushing RW out in front of her, PT providing chair follow.   Stairs             Wheelchair Mobility     Tilt Bed    Modified Rankin (Stroke Patients Only)       Balance Overall balance assessment: Needs assistance Sitting-balance support: No upper extremity supported, Feet supported Sitting balance-[name]: Fair     Standing balance support: Bilateral upper extremity supported, Reliant on assistive device for balance Standing balance-[name]: Poor                              Cognition Arousal: Alert Behavior During Therapy: WFL for tasks assessed/performed Overall Cognitive Status: Impaired/Different from baseline Area  of Impairment: Following commands, Safety/judgement, Awareness, Problem solving, Memory, Attention                       Following Commands: Follows one step commands inconsistently, Follows multi-step commands with increased time Safety/Judgement: Decreased awareness of  safety Awareness: Emergent Problem Solving: Slow processing, Requires verbal cues, Requires tactile cues General Comments: Requires cuing for sustained attention to task, tangential in speech, tearful at times.        Exercises      General Comments General comments (skin integrity, edema, etc.): Pt engaged in reaching anteriorly while sitting in recliner with focus on shifting weight anteriorly in hopes this would translate to STS transfers & increase ease of transfers.      Pertinent Vitals/Pain Pain Assessment Pain Assessment: No/denies pain    Home Living                          Prior Function            PT Goals (current goals can now be found in the care plan section) Acute Rehab PT Goals Patient Stated Goal: to get stronger, return to independence PT Goal Formulation: With patient Time For Goal Achievement: 02/28/23 Potential to Achieve Goals: Good Progress towards PT goals: Progressing toward goals    Frequency    Min 1X/week      PT Plan      Co-evaluation              AM-PAC PT "6 Clicks" Mobility   Outcome Measure  Help needed turning from your back to your side while in a flat bed without using bedrails?: A Lot Help needed moving from lying on your back to sitting on the side of a flat bed without using bedrails?: A Lot Help needed moving to and from a bed to a chair (including a wheelchair)?: Total Help needed standing up from a chair using your arms (e.g., wheelchair or bedside chair)?: Total Help needed to walk in hospital room?: Total Help needed climbing 3-5 steps with a railing? : Total 6 Click Score: 8    End of Session Equipment Utilized During Treatment: Gait belt Activity Tolerance: Patient tolerated treatment well Patient left: in chair;with chair alarm set;with call bell/phone within reach   PT Visit Diagnosis: Other abnormalities of gait and mobility (R26.89);Muscle weakness (generalized) (M62.81);Difficulty in  walking, not elsewhere classified (R26.2);Unsteadiness on feet (R26.81)     Time: 4540-9811 PT Time Calculation (min) (ACUTE ONLY): 26 min  Charges:    $Therapeutic Activity: 23-37 mins PT General Charges $$ ACUTE PT VISIT: 1 Visit                     Aleda Grana, PT, DPT 02/17/23, 12:34 PM   Sandi Mariscal 02/17/2023, 12:32 PM

## 2023-02-17 NOTE — Progress Notes (Signed)
Mobility Specialist - Progress Note    02/17/23 1612  Mobility  Activity  (seated TherEx: leg raises, marches, adductor pillow squeezes, ankle pumps/circles)  Level of Assistance Independent  Assistive Device None  Range of Motion/Exercises Active;Right leg;Left leg  Activity Response Tolerated well  $Mobility charge 1 Mobility  Mobility Specialist Start Time (ACUTE ONLY) 1518  Mobility Specialist Stop Time (ACUTE ONLY) 1535  Mobility Specialist Time Calculation (min) (ACUTE ONLY) 17 min   Pt sitting in the recliner upon entry, utilizing RA. Pt agreeable to seated TherEx. Pt completed exercises listed above indep with MinA for directional cueing for ankle circles. Pt denied transfer to bed, wanting to sit in the recliner longer. Pt left seated in the recliner with alarm set and needs within reach.  Zetta Bills Mobility Specialist 02/17/23 4:18 PM

## 2023-02-18 DIAGNOSIS — T796XXA Traumatic ischemia of muscle, initial encounter: Secondary | ICD-10-CM | POA: Diagnosis not present

## 2023-02-18 LAB — PHOSPHORUS: Phosphorus: 3.4 mg/dL (ref 2.5–4.6)

## 2023-02-18 LAB — BASIC METABOLIC PANEL
Anion gap: 7 (ref 5–15)
BUN: 19 mg/dL (ref 8–23)
CO2: 25 mmol/L (ref 22–32)
Calcium: 8.6 mg/dL — ABNORMAL LOW (ref 8.9–10.3)
Chloride: 106 mmol/L (ref 98–111)
Creatinine, Ser: 1.09 mg/dL (ref 0.61–1.24)
GFR, Estimated: >60 mL/min (ref >60)
Glucose, Bld: 91 mg/dL (ref 70–99)
Potassium: 3.6 mmol/L (ref 3.5–5.1)
Sodium: 138 mmol/L (ref 135–145)

## 2023-02-18 LAB — CBC
HCT: 37.5 % — ABNORMAL LOW (ref 39.0–52.0)
Hemoglobin: 12.3 g/dL — ABNORMAL LOW (ref 13.0–17.0)
MCH: 30.3 pg (ref 26.0–34.0)
MCHC: 32.8 g/dL (ref 30.0–36.0)
MCV: 92.4 fL (ref 80.0–100.0)
Platelets: 228 10*3/uL (ref 150–400)
RBC: 4.06 MIL/uL — ABNORMAL LOW (ref 4.22–5.81)
RDW: 13.2 % (ref 11.5–15.5)
WBC: 6 10*3/uL (ref 4.0–10.5)
nRBC: 0 % (ref 0.0–0.2)

## 2023-02-18 LAB — MAGNESIUM: Magnesium: 2.1 mg/dL (ref 1.7–2.4)

## 2023-02-18 LAB — VITAMIN D 25 HYDROXY (VIT D DEFICIENCY, FRACTURES): Vit D, 25-Hydroxy: 122.15 ng/mL — ABNORMAL HIGH (ref 30–100)

## 2023-02-18 NOTE — Plan of Care (Signed)
  Problem: Education: Goal: Knowledge of disease or condition will improve Outcome: Progressing Goal: Knowledge of secondary prevention will improve (MUST DOCUMENT ALL) Outcome: Progressing Goal: Knowledge of patient specific risk factors will improve Loraine Leriche N/A or DELETE if not current risk factor) Outcome: Progressing   Problem: Ischemic Stroke/TIA Tissue Perfusion: Goal: Complications of ischemic stroke/TIA will be minimized Outcome: Progressing   Problem: Coping: Goal: Will verbalize positive feelings about self Outcome: Progressing Goal: Will identify appropriate support needs Outcome: Progressing   Problem: Health Behavior/Discharge Planning: Goal: Ability to manage health-related needs will improve Outcome: Progressing Goal: Goals will be collaboratively established with patient/family Outcome: Progressing   Problem: Self-Care: Goal: Ability to participate in self-care as condition permits will improve Outcome: Progressing Goal: Verbalization of feelings and concerns over difficulty with self-care will improve Outcome: Progressing Goal: Ability to communicate needs accurately will improve Outcome: Progressing   Problem: Nutrition: Goal: Risk of aspiration will decrease Outcome: Progressing Goal: Dietary intake will improve Outcome: Progressing   Problem: Education: Goal: Knowledge of General Education information will improve Description: Including pain rating scale, medication(s)/side effects and non-pharmacologic comfort measures Outcome: Progressing   Problem: Clinical Measurements: Goal: Ability to maintain clinical measurements within normal limits will improve Outcome: Progressing Goal: Will remain free from infection Outcome: Progressing Goal: Diagnostic test results will improve Outcome: Progressing Goal: Respiratory complications will improve Outcome: Progressing Goal: Cardiovascular complication will be avoided Outcome: Progressing   Problem:  Activity: Goal: Risk for activity intolerance will decrease Outcome: Progressing   Problem: Nutrition: Goal: Adequate nutrition will be maintained Outcome: Progressing   Problem: Coping: Goal: Level of anxiety will decrease Outcome: Progressing   Problem: Elimination: Goal: Will not experience complications related to bowel motility Outcome: Progressing Goal: Will not experience complications related to urinary retention Outcome: Progressing   Problem: Pain Managment: Goal: General experience of comfort will improve Outcome: Progressing

## 2023-02-18 NOTE — Plan of Care (Signed)
  Problem: Ischemic Stroke/TIA Tissue Perfusion: Goal: Complications of ischemic stroke/TIA will be minimized Outcome: Progressing   

## 2023-02-18 NOTE — Progress Notes (Signed)
[facility] Progress Note  Patient: Thomas [name]    ZOX:[id number]  DOA: 02/12/2023     Date of Service: the patient was seen and examined on 02/18/2023  Chief Complaint  Patient presents with   Fall   Brief hospital course: 89 y.o. male with medical history significant of essential hypertension, GERD who lives alone who was brought to the ED due to generalized weakness.  Found down at home.  Patient lives alone and family checks on him and brings in meals.  Last seen approximately 631-day prior to admission when they took him to dinner and he was in his usual state of health.  Returned the next morning approximately 1 PM to find him on the bathroom floor.  Patient is confused and unable to provide accurate history.  Does deny pain.   On initial presentation patient is hemodynamically stable.  He has some scattered bruises but is not endorsing any pain.  Laboratory investigation significant for mildly elevated troponin to 134.  EKG nonischemic.  Patient has evidence of rhabdomyolysis.  Relatively mild.   10/5: MRI positive for multiple acute infarcts.  Suspect cardioembolic origin.  Neurology consulted.   10/6: Discussed with neurology.  Etiology of CVA likely ischemic small vessel disease rather than cardioembolic.  On 10/6 AM called to bedside by nursing as patient was in notable pain located to the right upper abdomen.  Labs reassuring.  CT abdomen pelvis without acute findings.  Received morphine and pain improving.   Assessment and Plan: Acute CVA MRI confirmed multifocal infarcts.  Per neurology likely small vessel ischemic disease Plan: DAPT aspirin Plavix Echo with bubble study, completed, negative Continue PT OT SLP May benefit from placement [facility] consult for IPR placement Medically stable for DC   Abdominal pain Noted on 10/6 AM.  CT abdomen pelvis without acute findings.  Laboratory workup reassuring.  Suspect possible indigestion/gas pain.  Initiate simethicone 80 mg 4  times daily.  Diet as tolerated.   Acute encephalopathy Likely secondary to acute infarct Background of cognitive decline/dementia Suspect concommitant hospital delirium Plan: Frequent orienting measures Delirium precautions   Rhabdomyolysis Found down PT OT and SLP consults No further IV fluids Encourage p.o. intake Medically ready for discharge to IPR     Elevated troponin Suspect supply/demand ischemia in the setting of acute infarct Troponin downtrending Continue telemetry monitoring   Essential hypertension Hold blood pressure medications for now   GERD PPI   There is no height or weight on file to calculate BMI.  Interventions:  Diet: Regular diet DVT Prophylaxis: Subcutaneous Lovenox   Advance goals of care discussion: DNR/Limited  Family Communication: family was not present at bedside, at the time of interview.  The pt provided permission to discuss medical plan with the family. Opportunity was given to ask question and all questions were answered satisfactorily.   Disposition:  Pt is from home, admitted with CVA, stable to discharge to inpatient rehab, insurance Auth is pending. Discharge to inpatient rehab, when insurance Auth will be done.  Subjective: No significant events overnight, patient was sitting comfortably in the recliner, feels improvement in the left lower weakness, no new complaints.  Physical Exam: General: NAD, lying comfortably Appear in no distress, affect appropriate Eyes: PERRLA ENT: Oral Mucosa Clear, moist  Neck: no JVD,  Cardiovascular: S1 and S2 Present, no Murmur,  Respiratory: good respiratory effort, Bilateral Air entry equal and Decreased, no Crackles, no wheezes Abdomen: Bowel Sound present, Soft and no tenderness,  Skin: Left knee abrasion,  continue dressing daily Extremities: no Pedal edema, no calf tenderness Neurologic: CN grossly intact, left lower extremity weakness power 4/5.  No any other focal deficits Gait  not checked due to patient safety concerns  Vitals:   02/17/23 1704 02/17/23 2022 02/18/23 0628 02/18/23 0751  BP: (!) 117/58 122/69 123/78 (!) 142/74  Pulse: 99 83 62 63  Resp: 19 20 20 19   Temp: 97.9 F (36.6 C) 98 F (36.7 C) 98.1 F (36.7 C) 98.2 F (36.8 C)  TempSrc:      SpO2: 93% 99% 96% 94%   No intake or output data in the 24 hours ending 02/18/23 1349  There were no vitals filed for this visit.  Data Reviewed: I have personally reviewed and interpreted daily labs, tele strips, imagings as discussed above. I reviewed all nursing notes, pharmacy notes, vitals, pertinent old records I have discussed plan of care as described above with RN and patient/family.  CBC: Recent Labs  Lab 02/12/23 1400 02/13/23 0540 02/14/23 0831 02/18/23 0810  WBC 10.4 8.1 6.2 6.0  NEUTROABS 9.1*  --  4.1  --   HGB 15.8 14.4 13.0 12.3*  HCT 47.1 43.4 38.9* 37.5*  MCV 89.9 90.6 90.7 92.4  PLT 237 206 193 228   Basic Metabolic Panel: Recent Labs  Lab 02/12/23 1400 02/13/23 0540 02/14/23 0831 02/16/23 0818 02/18/23 0810  NA 138 136 139 139 138  K 3.9 3.6 4.0 3.7 3.6  CL 105 106 107 110 106  CO2 23 22 23 24 25   GLUCOSE 126* 94 90 91 91  BUN 16 13 15 16 19   CREATININE 1.09 0.99 1.11 1.00 1.09  CALCIUM 9.3 8.6* 8.7* 8.8* 8.6*  MG  --   --   --  2.2 2.1  PHOS  --   --   --   --  3.4    Studies: No results found.  Scheduled Meds:  aspirin EC  81 mg Oral Daily   atorvastatin  40 mg Oral Daily   clopidogrel  75 mg Oral Daily   enoxaparin (LOVENOX) injection  40 mg Subcutaneous Q24H   pantoprazole  40 mg Oral Daily   simethicone  80 mg Oral QID   traZODone  50 mg Oral QHS   Continuous Infusions: PRN Meds: acetaminophen **OR** acetaminophen, albuterol, morphine injection, ondansetron **OR** ondansetron (ZOFRAN) IV, oxyCODONE, senna  Time spent: 35 minutes  Author: Gillis Santa. MD [facility] 02/18/2023 1:49 PM  To reach On-call, see care teams to locate the  attending and reach out to them via [email or web address]. If 7PM-7AM, please contact night-coverage If you still have difficulty reaching the attending provider, please page the [facility] (Director on Call) for [facility] on amion for assistance.

## 2023-02-18 NOTE — Progress Notes (Signed)
Physical Therapy Treatment Patient Details Name: Thomas [name] MRN: [id number] DOB: 11/24/1930 Today's Date: 02/18/2023   History of Present Illness Pt is an 89 y.o. male admitted on 10/4 after being found down on the bathroom floor at home. Patient was confused and unable to provide any accurate history. 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 PMH including but not limited to HTN, GERD.    PT Comments  Pt resting in recliner upon PT arrival; agreeable to therapy.  Pt reporting no pain during session.  Currently pt is max assist with transfers (x3 trials from recliner) and mod assist to ambulate 20 feet and then (after sitting rest break) 15 feet with RW use (chair follow for safety).  Intermittent vc's required for upright posture.  Will continue to focus on strengthening and progressive functional mobility during hospitalization.   If plan is discharge home, recommend the following: Two people to help with walking and/or transfers;A lot of help with bathing/dressing/bathroom;Assistance with cooking/housework;Assistance with feeding;Direct supervision/assist for medications management;Direct supervision/assist for financial management;Assist for transportation;Help with stairs or ramp for entrance;Supervision due to cognitive status   Can travel by private vehicle        Equipment Recommendations  Other (comment) (Defer to next venue of care)    Recommendations for Other Services       Precautions / Restrictions Precautions Precautions: Fall Restrictions Weight Bearing Restrictions:  No     Mobility  Bed Mobility               General bed mobility comments: Deferred (pt in recliner beginning/end of session)    Transfers Overall transfer level: Needs assistance Equipment used: Rolling walker (2 wheels) Transfers: Sit to/from Stand Sit to Stand: Max assist           General transfer comment: x3 trials standing from recliner; vc's to scoot forward prior to standing; vc's for UE/LE placement    Ambulation/Gait Ambulation/Gait assistance: Mod assist Gait Distance (Feet):  (20 feet; 15 feet) Assistive device: Rolling walker (2 wheels) Gait Pattern/deviations: Decreased step length - right, Decreased step length - left, Decreased dorsiflexion - right, Decreased dorsiflexion - left, Decreased stride length, Shuffle; decreased stance time L LE and difficulty advancing L LE at times Gait velocity: decreased     General Gait Details: intermittent vc's for upright posture; chair follow   Stairs             Wheelchair Mobility     Tilt Bed    Modified Rankin (Stroke Patients Only)       Balance Overall balance assessment: Needs assistance Sitting-balance support: No upper extremity supported, Feet supported Sitting balance-[name]: Good Sitting balance - Comments: steady reaching within BOS   Standing balance support: Bilateral upper extremity supported, Reliant on assistive device for balance Standing balance-[name]: Fair Standing balance comment: steady static standing with B UE support on RW                            Cognition Arousal: Alert Behavior During Therapy: WFL for tasks assessed/performed Overall Cognitive Status: Impaired/Different from baseline Area of Impairment: Safety/judgement, Attention,  Following commands, Problem solving                 Orientation Level: Disoriented to, Time Current Attention Level: Sustained Memory: Decreased short-term memory Following Commands: Follows one step  commands inconsistently, Follows multi-step commands with increased time Safety/Judgement: Decreased awareness of safety Awareness: Emergent Problem Solving: Slow processing, Requires verbal cues, Requires tactile cues General Comments: Tangential in speech        Exercises      General Comments  Pt agreeable to PT session.      Pertinent Vitals/Pain Pain Assessment Pain Assessment: No/denies pain Pain Intervention(s): Limited activity within patient's tolerance, Monitored during session, Repositioned Vitals (HR and SpO2 on room air) stable and WFL throughout treatment session.    Home Living                          Prior Function            PT Goals (current goals can now be found in the care plan section) Acute Rehab PT Goals Patient Stated Goal: to get stronger, return to independence PT Goal Formulation: With patient Time For Goal Achievement: 02/28/23 Potential to Achieve Goals: Good Progress towards PT goals: Progressing toward goals    Frequency    Min 1X/week      PT Plan      Co-evaluation              AM-PAC PT "6 Clicks" Mobility   Outcome Measure  Help needed turning from your back to your side while in a flat bed without using bedrails?: A Lot Help needed moving from lying on your back to sitting on the side of a flat bed without using bedrails?: A Lot Help needed moving to and from a bed to a chair (including a wheelchair)?: A Lot Help needed standing up from a chair using your arms (e.g., wheelchair or bedside chair)?: A Lot Help needed to walk in hospital room?: A Lot Help needed climbing 3-5 steps with a railing? : Total 6 Click Score: 11    End of Session Equipment Utilized During Treatment: Gait belt Activity Tolerance: Patient tolerated treatment well Patient left: in chair;with call bell/phone within reach;with chair alarm set Nurse Communication: Mobility status;Precautions PT Visit Diagnosis: Other abnormalities  of gait and mobility (R26.89);Muscle weakness (generalized) (M62.81);Difficulty in walking, not elsewhere classified (R26.2);Unsteadiness on feet (R26.81)     Time: 6387-5643 PT Time Calculation (min) (ACUTE ONLY): 25 min  Charges:    $Gait Training: 8-22 mins $Therapeutic Activity: 8-22 mins PT General Charges $$ ACUTE PT VISIT: 1 Visit                     Hendricks Limes, PT 02/18/23, 2:04 PM

## 2023-02-18 NOTE — Progress Notes (Signed)
Occupational Therapy Treatment Patient Details Name: Thomas [name] MRN: [id number] DOB: 1930/08/11 Today's Date: 02/18/2023   History of present illness Pt is an 89 y.o. male admitted on 10/4 after being found down on the bathroom floor at home. Patient was confused and unable to provide any accurate history. 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 PMH including but not limited to HTN, GERD.   OT comments  Chart reviewed, pt greeted in room, oriented to self, tangential throughout. Family in room at completion of tx session. Tx session targeted improving functional activity tolerance in the setting of ADL tasks, improving dynamic/static standing balance in prep for improved functional transfers. MAX A required for multiple STS however pt able to maintain static standing balance with MOD A for approx 1 minute 3 different attempts. Improved step pivot transfers with MIN-MOD A. Pt is making progress towards goals, discharge remains appropriate. OT will follow acutely.       If plan is discharge home, recommend the following:  A lot of help with walking and/or transfers;A lot of help with bathing/dressing/bathroom;Supervision due to cognitive status;Direct supervision/assist for financial management;Assistance with cooking/housework;Assist for transportation;Help with stairs or ramp for entrance;Direct supervision/assist for medications management   Equipment Recommendations       Recommendations for Other Services      Precautions / Restrictions Precautions Precautions:  Fall Restrictions Weight Bearing Restrictions: No       Mobility Bed Mobility Overal bed mobility: Needs Assistance Bed Mobility: Supine to Sit     Supine to sit: Mod assist, HOB elevated, +2 for safety/equipment Sit to supine: Mod assist, HOB elevated, +2 for safety/equipment        Transfers Overall transfer level: Needs assistance Equipment used: Rolling walker (2 wheels) Transfers: Sit to/from Stand Sit to Stand: Max assist - multiple attempts      Step pivot transfers: MIN-Mod assist, +2 physical assistance     General transfer comment: frequent vcs for technique     Balance Overall balance assessment: Needs assistance Sitting-balance support: No upper extremity supported, Feet supported Sitting balance-[name]: Good   Postural control: Right lateral lean Standing balance support: Bilateral upper extremity supported, Reliant on assistive device for balance Standing balance-[name]: Poor- sustained static/dynamic standing tasks with MOD A. Pt initiates weight shifting to maintain balance approx 50% of the time                              ADL either performed or assessed with clinical judgement   ADL Overall ADL's : Needs assistance/impaired Eating/Feeding: Set up;Sitting   Grooming: Wash/dry face; MIN A, standing at sink level                    Toilet Transfer: Minimal assistance;Moderate assistance;Rolling walker (2 wheels) Toilet Transfer Details (indicate cue type and reason): step pivot to bedside chair Toileting- Clothing Manipulation and Hygiene: Maximal assistance Toileting - Clothing Manipulation Details (indicate cue type and reason): urinal use standing on edge of bed     Functional mobility during ADLs: Minimal assistance;Moderate assistance;Rolling walker (2 wheels)  Extremity/Trunk Assessment              Vision       Perception     Praxis      Cognition Arousal: Alert Behavior During Therapy:  WFL for tasks assessed/performed Overall Cognitive Status: Impaired/Different from baseline Area of Impairment: Orientation, Attention, Memory, Following commands, Safety/judgement, Awareness, Problem solving                 Orientation Level: Disoriented to, Place, Time, Situation Current Attention Level: Sustained Memory: Decreased short-term memory Following Commands: Follows one step commands inconsistently, Follows multi-step commands with increased time Safety/Judgement: Decreased awareness of safety Awareness: Emergent Problem Solving: Slow processing, Requires verbal cues, Requires tactile cues General Comments: tangential        Exercises      Shoulder Instructions       General Comments      Pertinent Vitals/ Pain       Pain Assessment Pain Assessment: No/denies pain  Home Living                                          Prior Functioning/Environment              Frequency  Min 1X/week        Progress Toward Goals  OT Goals(current goals can now be found in the care plan section)  Progress towards OT goals: Progressing toward goals     Plan      Co-evaluation                 AM-PAC OT "6 Clicks" Daily Activity     Outcome Measure   Help from another person eating meals?: A Little Help from another person taking care of personal grooming?: A Little Help from another person toileting, which includes using toliet, bedpan, or urinal?: A Lot Help from another person bathing (including washing, rinsing, drying)?: A Lot Help from another person to put on and taking off regular upper body clothing?: A Lot Help from another person to put on and taking off regular lower body clothing?: A Lot 6 Click Score: 14    End of Session Equipment Utilized During Treatment: Rolling walker (2 wheels)  OT Visit Diagnosis: Unsteadiness on feet (R26.81);Other abnormalities of gait and mobility (R26.89);Repeated falls (R29.6);Muscle  weakness (generalized) (M62.81);History of falling (Z91.81)   Activity Tolerance Patient tolerated treatment well   Patient Left in chair;with call bell/phone within reach;with chair alarm set;with family/visitor present   Nurse Communication Mobility status        Time: 1405-1430 OT Time Calculation (min): 25 min  Charges: OT General Charges $OT Visit: 1 Visit OT Treatments $Self Care/Home Management : 8-22 mins $Neuromuscular Re-education: 8-22 mins Oleta Mouse, OTD OTR/L  02/18/23, 2:50 PM

## 2023-02-19 DIAGNOSIS — I639 Cerebral infarction, unspecified: Secondary | ICD-10-CM | POA: Diagnosis not present

## 2023-02-19 LAB — CREATININE, SERUM
Creatinine, Ser: 1 mg/dL (ref 0.61–1.24)
GFR, Estimated: >60 mL/min (ref >60)

## 2023-02-19 MED ORDER — BISACODYL 5 MG PO TBEC: 10.0000 mg | DELAYED_RELEASE_TABLET | Freq: Every day | ORAL | Status: DC

## 2023-02-19 MED ORDER — BISACODYL 10 MG RE SUPP: 10.0000 mg | Freq: Every day | RECTAL | Status: AC | PRN

## 2023-02-19 MED ORDER — CLOPIDOGREL BISULFATE 75 MG PO TABS: 75.0000 mg | ORAL_TABLET | Freq: Every day | ORAL | Status: DC

## 2023-02-19 MED ORDER — BISACODYL 5 MG PO TBEC: 10.0000 mg | DELAYED_RELEASE_TABLET | Freq: Every day | ORAL | Status: AC

## 2023-02-19 MED ORDER — PANTOPRAZOLE SODIUM 40 MG PO TBEC: 40.0000 mg | DELAYED_RELEASE_TABLET | Freq: Every day | ORAL | Status: DC

## 2023-02-19 MED ORDER — POLYETHYLENE GLYCOL 3350 17 G PO PACK: 17.0000 g | PACK | Freq: Every day | ORAL | Status: DC

## 2023-02-19 MED ORDER — BISACODYL 5 MG PO TBEC
10.0000 mg | DELAYED_RELEASE_TABLET | Freq: Once | ORAL | Status: AC
  Administered 2023-02-19: 10 mg via ORAL
  Filled 2023-02-19: qty 2

## 2023-02-19 MED ORDER — POLYETHYLENE GLYCOL 3350 17 G PO PACK
17.0000 g | PACK | Freq: Every day | ORAL | Status: DC
  Administered 2023-02-19: 17 g via ORAL
  Filled 2023-02-19: qty 1

## 2023-02-19 MED ORDER — BISACODYL 10 MG RE SUPP: 10.0000 mg | Freq: Every day | RECTAL | Status: DC | PRN

## 2023-02-19 NOTE — Evaluation (Signed)
Occupational Therapy Treatment  Patient Details Name: Thomas [name] MRN: [id number] DOB: 1930-05-25 Today's Date: 02/19/2023   History of Present Illness Pt is an 89 y.o. male admitted on 10/4 after being found down on the bathroom floor at home. Patient was confused and unable to provide any accurate history. 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 PMH including but not limited to HTN, GERD.   Clinical Impression   Chart reviewed prior to tx. Pt seen for OT treatment on this date. Upon arrival to room pt seated in recliner finishing breakfast. Pt motivated and alert during session. Tx session targeted improving ADL activity tolerance. Pt completed seated oral care and face washing; step by step verbal/tactile cues and MIN physical assist to complete task. Pt completed standing peri care requiring MAX A for peri care, MIN A for static standing balance. Pt required MIN A - MOD A to STS from recliner, x2 attempts needed to widen BOS and further scoot to the edge of the chair + frequent verbal/tactile cues for sequencing. MD in room to communicate d/c update to Pt. Pt making good progress toward goals, will continue to follow POC. Discharge recommendation remains appropriate. OT will follow acutely.       If plan is discharge home, recommend the following: A lot of help with walking and/or transfers;A lot of help with bathing/dressing/bathroom;Supervision due to cognitive status;Direct supervision/assist for financial management;Assistance with cooking/housework;Assist for transportation;Help with  stairs or ramp for entrance;Direct supervision/assist for medications management    Functional Status Assessment  Patient has had a recent decline in their functional status and demonstrates the ability to make significant improvements in function in a reasonable and predictable amount of time.  Equipment Recommendations       Recommendations for Other Services       Precautions / Restrictions Precautions Precautions: Fall Precaution Comments: L lat tibial wound Restrictions Weight Bearing Restrictions: No      Mobility Bed Mobility Overal bed mobility: Needs Assistance (NT pt in recliener pre/post tx)                  Transfers Overall transfer level: Needs assistance Equipment used: Rolling walker (2 wheels) Transfers: Sit to/from Stand, Bed to chair/wheelchair/BSC Sit to Stand: Min assist, Max assist           General transfer comment: First attempt STS Pt stated he needed a better grip on floor + MOD A + RW. Second STS attempt MOD A for lift off, MIN A + RW during standing for peri care      Balance Overall balance assessment: Needs assistance Sitting-balance support: No upper extremity supported, Feet supported Sitting balance-[name]: Good     Standing balance support: Bilateral upper extremity supported, Reliant on assistive device for balance, During functional activity Standing balance-[name]: Fair Standing balance comment: able to maintain static standing balance with BUE support on RW; heavy reliance on AD during standing peri care                           ADL either performed or assessed with clinical judgement   ADL Overall  ADL's : Needs assistance/impaired Eating/Feeding: Set up;Sitting Eating/Feeding Details (indicate cue type and reason): Assist with coffee/water cup, needing verbal cue to continue bringing cup to mouth Grooming: Wash/dry face;Set up;Sitting;Oral care;Minimal assistance;Cueing for sequencing Grooming  Details (indicate cue type and reason): frequent verbal cues for sequencing                     Toileting- Clothing Manipulation and Hygiene: Maximal assistance;Sit to/from stand Toileting - Clothing Manipulation Details (indicate cue type and reason): urinal use standing infront of recliner     Functional mobility during ADLs: Minimal assistance;Moderate assistance;Rolling walker (2 wheels) General ADL Comments: Inital verbal/tactle cues for widening BOS in standing. Pt standing during peri seemed imporved today, with no LOB noted.     Vision         Perception         Praxis         Pertinent Vitals/Pain Pain Assessment Pain Assessment: No/denies pain     Extremity/Trunk Assessment             Communication Communication Communication: No apparent difficulties Following commands: Follows one step commands with increased time Cueing Techniques: Verbal cues;Tactile cues   Cognition Arousal: Alert Behavior During Therapy: WFL for tasks assessed/performed Overall Cognitive Status: Within Functional Limits for tasks assessed Area of Impairment: Orientation, Attention, Memory, Following commands, Safety/judgement, Awareness, Problem solving                 Orientation Level: Disoriented to, Place, Time, Situation Current Attention Level: Sustained Memory: Decreased short-term memory Following Commands: Follows one step commands with increased time  Safety/Judgement: Decreased awareness of safety Awareness: Emergent Problem Solving: Slow processing, Requires verbal cues, Requires tactile cues General Comments: pleasant and cooperative througout OT session     General Comments  Pt L knee bandage intact pre/post tx.    Exercises Other Exercises Other Exercises: Re edu: Widen BOS during STS   Shoulder Instructions      Home Living                                          Prior Functioning/Environment                           OT Problem List: Decreased strength;Decreased safety awareness;Decreased activity tolerance;Impaired balance (sitting and/or standing);Decreased knowledge of use of DME or AE      OT Treatment/Interventions: Self-care/ADL training;Therapeutic exercise;Neuromuscular education;DME and/or AE instruction;Manual therapy;Balance training;Patient/family education    OT Goals(Current goals can be found in the care plan section) Acute Rehab OT Goals Patient Stated Goal: go to [facility] OT Goal Formulation: With patient Time For Goal Achievement: 02/27/23 Potential to Achieve Goals: Good  OT Frequency: Min 1X/week    Co-evaluation              AM-PAC OT "6 Clicks" Daily Activity     Outcome Measure Help from another person eating meals?: A Little Help from another person taking care of personal grooming?: A Little Help from another person toileting, which includes using toliet, bedpan, or urinal?: A Lot Help from another person bathing (including washing, rinsing, drying)?: A Lot Help from another person to put on and taking off regular upper body clothing?: A Lot Help from another person to put on and taking off regular lower body clothing?: A Lot 6  Click Score: 14   End of Session Equipment Utilized During Treatment: Rolling walker (2 wheels) Nurse Communication: Mobility status  Activity Tolerance: Patient tolerated treatment well Patient left: in chair;with call bell/phone within reach;with chair alarm set  OT Visit Diagnosis: Unsteadiness on feet (R26.81);Other abnormalities of gait and mobility (R26.89);Repeated falls (R29.6);Muscle weakness (generalized) (M62.81);History of falling (Z91.81)                Time: 8416-6063 OT Time Calculation (min): 26 min Charges:  OT General Charges $OT Visit: 1 Visit OT Treatments $Self Care/Home Management : 23-37 mins  [employer], OTS

## 2023-02-19 NOTE — Progress Notes (Signed)
Mobility Specialist - Progress Note   02/19/23 0957  Mobility  Activity Ambulated with assistance in room;Transferred from bed to chair  Level of Assistance Minimal assist, patient does 75% or more  Assistive Device Front wheel walker  Distance Ambulated (ft) 4 ft  Activity Response Tolerated well  $Mobility charge 1 Mobility  Mobility Specialist Start Time (ACUTE ONLY) [id number]  Mobility Specialist Stop Time (ACUTE ONLY) 0914  Mobility Specialist Time Calculation (min) (ACUTE ONLY) 10 min   Pt supine upon entry, utilizing RA. Pt agreeable to transfer to the recliner this date. Pt completed bed mob MinA to bring BLE EOB, HHA for trunk support. Pt STS to RW MinA +2, transferred to the recliner MinA +2 for safety--tactile cueing for hand placement on the RW, MinA to weight shift to progress LLE, manual manipulation of the RW required. Pt left seated in the recliner with alarm set and needs within reach.  Zetta Bills Mobility Specialist 02/19/23 10:02 AM

## 2023-02-19 NOTE — Progress Notes (Addendum)
Physical Therapy Treatment Patient Details Name: Thomas [name] MRN: [id number] DOB: 27-Feb-1931 Today's Date: 02/19/2023   History of Present Illness Pt is an 89 y.o. male admitted on 10/4 after being found down on the bathroom floor at home. Patient was confused and unable to provide any accurate history. 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 PMH including but not limited to HTN, GERD.    PT Comments  Pt is received in recliner, he is agreeable to PT session. Pt performs bed mobility minA x2, transfers min-max A, and amb mod A with a chair follow for safety. Pt able to amb approx 20 ft using RW mod A but requires frequent multimodal cuing to promote upright posture during amb activity. Intermittent decreased L>R stride length noted and able to correct with cuing and facilitation by PT. Additionally, Pt able to perform 3x STS from recliner with cuing to scoot to edge of seat to facilitate task; able to progress from max A to min A during the last 2 STS. Pt requires B feet blocking during STS to prevent anterior slide of feet. Overall, Pt demonstrates steady progression towards PT goals during today's session. Pt would benefit from continued skilled PT to address above deficits and promote optimal return to PLOF.    If plan is discharge home, recommend the following: Two people to help with walking and/or transfers;A lot of help with bathing/dressing/bathroom;Assistance with cooking/housework;Assistance with feeding;Direct supervision/assist for medications management;Direct supervision/assist for  financial management;Assist for transportation;Help with stairs or ramp for entrance;Supervision due to cognitive status   Can travel by private vehicle        Equipment Recommendations  Other (comment) (TBD at next facility)    Recommendations for Other Services Rehab consult     Precautions / Restrictions Precautions Precautions: Fall Precaution Comments: L lat tibial wound Restrictions Weight Bearing Restrictions: No     Mobility  Bed Mobility Overal bed mobility: Needs Assistance Bed Mobility: Sit to Supine       Sit to supine: Min assist, +2 for physical assistance, Used rails   General bed mobility comments: Pt able to perform bed mobility min A x2 for trunk and BLE management    Transfers Overall transfer level: Needs assistance Equipment used: Rolling walker (2 wheels) Transfers: Sit to/from Stand, Bed to chair/wheelchair/BSC Sit to Stand: Min assist, Max assist   Step pivot transfers: Mod assist       General transfer comment: frequent cuing for technique and scooting edge of seat; 1st STS requires max A for completion of task but able to progress to min A for other 2 STS; B feet blocking to prevent forward slide    Ambulation/Gait Ambulation/Gait assistance: Mod assist Gait Distance (Feet): 20 Feet Assistive device: Rolling walker (2 wheels) Gait Pattern/deviations: Decreased step length - right, Decreased step length - left, Decreased dorsiflexion - right, Decreased dorsiflexion - left, Decreased stride length, Shuffle Gait velocity: decreased     General Gait Details: intermittent decrease stride length on L>R during amb; frequent multimodal cuing for upright posture during amb; chair follow for safety   Stairs             Wheelchair  Mobility     Tilt Bed    Modified Rankin (Stroke Patients Only)       Balance Overall balance assessment: Needs assistance Sitting-balance support: No upper extremity supported, Feet  supported Sitting balance-[name]: Good Sitting balance - Comments: able to maintain seated edge of seat balance during functional activities Postural control: Posterior lean Standing balance support: Bilateral upper extremity supported, Reliant on assistive device for balance Standing balance-[name]: Fair Standing balance comment: able to maintain static standing balance with BUE support on RW; heavy reliance on AD                            Cognition Arousal: Alert Behavior During Therapy: WFL for tasks assessed/performed Overall Cognitive Status: Within Functional Limits for tasks assessed                                 General Comments: pleasant and cooperative with PT session        Exercises      General Comments General comments (skin integrity, edema, etc.): Pt L knee bandage in tact pre/post tx.      Pertinent Vitals/Pain Pain Assessment Pain Assessment: No/denies pain    Home Living                          Prior Function            PT Goals (current goals can now be found in the care plan section) Acute Rehab PT Goals Patient Stated Goal: to get stronger, return to independence PT Goal Formulation: With patient Time For Goal Achievement: 02/28/23 Potential to Achieve Goals: Good Progress towards PT goals: Progressing toward goals    Frequency    Min 1X/week      PT Plan      Co-evaluation              AM-PAC PT "6 Clicks" Mobility   Outcome Measure  Help needed turning from your back to your side while in a flat bed without using bedrails?: A Little Help needed moving from lying on your back to sitting on the side of a flat bed without using bedrails?: A Little Help needed moving to and from a bed to a chair (including a wheelchair)?: A Lot Help needed standing up from a chair using your arms (e.g., wheelchair or bedside chair)?: A Lot Help needed to walk in hospital room?: A Lot Help needed  climbing 3-5 steps with a railing? : Total 6 Click Score: 13    End of Session Equipment Utilized During Treatment: Gait belt Activity Tolerance: Patient tolerated treatment well Patient left: in bed;with call bell/phone within reach;with bed alarm set;with nursing/sitter in room Nurse Communication: Mobility status;Precautions PT Visit Diagnosis: Other abnormalities of gait and mobility (R26.89);Muscle weakness (generalized) (M62.81);Difficulty in walking, not elsewhere classified (R26.2);Unsteadiness on feet (R26.81)     Time: 1610-9604 PT Time Calculation (min) (ACUTE ONLY): 23 min  Charges:    $Gait Training: 8-22 mins $Therapeutic Activity: 8-22 mins PT General Charges $$ ACUTE PT VISIT: 1 Visit                     Elmon Else, SPT    Fiorella Barreda 02/19/2023, 11:02 AM  This entire session was guided, instructed, and directly supervised by Hendricks Limes, PT.  This note has been  reviewed and this clinician agrees with the information provided.  Hendricks Limes, PT 02/19/23, 12:55 PM

## 2023-02-19 NOTE — TOC Transition Note (Signed)
Transition of Care [facility]) - CM/SW Discharge Note   Patient Details  Name: Thomas [name] MRN: [id number] Date of Birth: 06-28-1930  Transition of Care [facility]) CM/SW Contact:  Garret Reddish, RN Phone Number: 02/19/2023, 10:26 AM   Clinical Narrative:     Chart reviewed.  I have received call from Onalee Hua, Admissions Coordinator with [facility] in [place].  His contact number is [phone].  Onalee Hua informs me that patient has received approval for Acute rehab.  Onalee Hua reports that patient will be going to room 3H219 and the number to call report is [phone].  Onalee Hua reports that he will need the [facility] and discharge summary faxed to [phone].  I have faxed Discharge summary to above number.  I have asked Nursing Unit to fax the [facility] to the above number as they need today's Medication record.    I have arranged EMS transport via [facility].  [facility] will transport patient today at 1030 am.    I have  informed patient and his  son-in-law Gigi Gin  that patient has received approval to to [facility] today.  I have informed  Mrs. Melvyn Neth and Tim that [facility] will transport patient to the rehab today.  I have also informed Tim that patient's room number is 3H219.  I have informed staff nurse of the above information.     Final next level of care: IP Rehab Facility [facility]) Barriers to Discharge: No Barriers Identified   Patient Goals and CMS Choice CMS Medicare.gov Compare Post Acute Care list provided to:: Patient Choice offered to / list presented to : Patient  Discharge Placement                Patient chooses bed at:  [facility]) Patient to be transferred to facility by: [facility] Name of family member notified: Gigi Gin Patient and family notified of of transfer: 02/19/23  Discharge Plan and Services Additional resources added to the After Visit Summary for                                        Social Determinants of Health (SDOH) Interventions SDOH Screenings   Food Insecurity: No Food Insecurity (02/13/2023)  Housing: Low Risk  (02/13/2023)  Transportation Needs: No Transportation Needs (02/13/2023)  Utilities: Not At Risk (02/13/2023)  Financial Resource Strain: Low Risk  (10/12/2022)   Received from [facility]  Tobacco Use: Low Risk  (02/13/2023)     Readmission Risk Interventions     No data to display

## 2023-02-19 NOTE — Progress Notes (Signed)
Called report to Foye Spurling, at [facility]  rehab.

## 2023-02-19 NOTE — Discharge Summary (Signed)
[facility] Discharge Summary   Patient: Thomas [name] ZOX:[id number]  PCP: Barbette Reichmann, MD  Date of admission: 02/12/2023   Date of discharge:  02/19/2023     Discharge Diagnoses:  Principal Problem:   Acute CVA (cerebrovascular accident) [facility]) Active Problems:   Rhabdomyolysis   Admitted From: Home Disposition:  [facility]  Recommendations for Outpatient Follow-up:  F/u PCP F/u Neurologist in 1-2 weeks Follow up LABS/TEST:     Diet recommendation: Cardiac diet  Activity: The patient is advised to gradually reintroduce usual activities, as tolerated  Discharge Condition: stable  Code Status: Limited code   History of present illness: As per the H and P dictated on admission Hospital Course:  89 y.o. male with medical history significant of essential hypertension, GERD who lives alone who was brought to the ED due to generalized weakness.  Found down at home.  Patient lives alone and family checks on him and brings in meals.  Last seen approximately 631-day prior to admission when they took him to dinner and he was in his usual state of health.  Returned the next morning approximately 1 PM to find him on the bathroom floor.  Patient is confused and unable to provide accurate history.  Does deny pain. On initial presentation patient is hemodynamically stable.  He has some scattered bruises but is not endorsing any pain.  Laboratory investigation significant for mildly elevated troponin to 134.  EKG nonischemic.  Patient has evidence of rhabdomyolysis.  Relatively mild. 10/5: MRI positive for multiple acute infarcts.  Suspect cardioembolic origin.  Neurology consulted. 10/6: Discussed with neurology.  Etiology of CVA likely ischemic small vessel disease rather than cardioembolic.  On 10/6 AM called to bedside by nursing as patient was in notable pain located to the right upper abdomen.  Labs reassuring.  CT abdomen pelvis without acute findings.  Received morphine and  pain improving.   Assessment and Plan: # Acute CVA, presented with left sided weakness MRI confirmed multifocal infarcts.  Per neurology likely small vessel ischemic disease DAPT aspirin and Plavix for 90 days, starting Sep 16 to Dec 14 as per Neurologist. Echo with bubble study, completed, negative. Seen by PT/OT/ SLP as per protocol. Weakness is improving. Acute rehab placement was recommended by PT. TOC following, pt got bed offer today. Stable to dc now.  # Abdominal pain, resolved  Noted on 10/6 AM.  CT abdomen pelvis without acute findings.  Laboratory workup reassuring.  Suspect possible indigestion/gas pain. S/p simethicone 80 mg 4 times daily.  Diet as tolerated. Started PPI    # Acute encephalopathy, Resolved Likely secondary to acute infarct, Background of cognitive decline/dementia Suspect concommitant hospital delirium. Continue Frequent orienting and  Delirium precautions # Rhabdomyolysis, Resolved Pt was Found down. Ck normal now, No further IV fluids. Encourage p.o. intake. Medically ready for discharge to I[facility] # Elevated troponin: Suspect supply/demand ischemia in the setting of acute infarct. Troponin down-trending. No chest pain  # Essential hypertension: pt s not on any medication. BP remained stable, continue to monitor and start Amlodipine 5 mg po daily if SBP >140 mmHg.  # GERD continue PPI # Constipation: use laxatives   Patient was seen by physical therapy, who recommended Therapy, Acute Rehab which was arranged. On the day of the discharge the patient's vitals were stable, and no other acute medical condition were reported by patient. the patient was felt safe to be discharge at Acute Rehab.  Consultants: Neurology Procedures: None  Discharge Exam: General:  Appear in no distress, no Rash; Oral Mucosa Clear, moist. Cardiovascular: S1 and S2 Present, no Murmur, Respiratory: normal respiratory effort, Bilateral Air entry present and no Crackles, no  wheezes Abdomen: Bowel Sound present, Soft and no tenderness, no hernia Extremities: no Pedal edema, no calf tenderness Neurology: Left lower extremity weakness, power 4/5 affect appropriate.  There were no vitals filed for this visit. Vitals:   02/19/23 0554 02/19/23 0808  BP: (!) 152/87 138/77  Pulse: 69 (!) 58  Resp: 20 18  Temp: (!) 97.5 F (36.4 C) 98.1 F (36.7 C)  SpO2: 96% 100%    DISCHARGE MEDICATION: Allergies as of 02/19/2023   No Known Allergies      Medication List     STOP taking these medications    albuterol 108 (90 Base) MCG/ACT inhaler Commonly known as: VENTOLIN HFA   ascorbic acid 500 MG tablet Commonly known as: VITAMIN C   furosemide 20 MG tablet Commonly known as: Lasix   gabapentin 100 MG capsule Commonly known as: NEURONTIN   guaiFENesin-dextromethorphan 100-10 MG/5ML syrup Commonly known as: ROBITUSSIN DM   omeprazole 20 MG capsule Commonly known as: PRILOSEC Replaced by: pantoprazole 40 MG tablet   Vitamin D (Ergocalciferol) 1.25 MG (50000 UNIT) Caps capsule Commonly known as: DRISDOL       TAKE these medications    acetaminophen 325 MG tablet Commonly known as: TYLENOL Take 2 tablets (650 mg total) by mouth every 6 (six) hours as needed for fever, headache or moderate pain.   aspirin EC 81 MG tablet Take by mouth.   clopidogrel 75 MG tablet Commonly known as: PLAVIX Take 1 tablet (75 mg total) by mouth daily. Total 90 days from Sep 16 to Dec 14th as per Neurologist Start taking on: February 20, 2023   cyanocobalamin 1000 MCG tablet Commonly known as: VITAMIN B12 Take 1,000 mcg by mouth daily.   lovastatin 40 MG tablet Commonly known as: MEVACOR Take by mouth.   pantoprazole 40 MG tablet Commonly known as: PROTONIX Take 1 tablet (40 mg total) by mouth daily. Start taking on: February 20, 2023 Replaces: omeprazole 20 MG capsule   senna 8.6 MG Tabs tablet Commonly known as: SENOKOT Take 1 tablet (8.6 mg total)  by mouth at bedtime as needed for mild constipation.               Discharge Care Instructions  (From admission, onward)           Start     Ordered   02/19/23 0000  Discharge wound care:       Comments: As above   02/19/23 0952           No Known Allergies Discharge Instructions     Call MD for:   Complete by: As directed    Weakness or Numbness   Call MD for:  extreme fatigue   Complete by: As directed    Call MD for:  persistant dizziness or light-headedness   Complete by: As directed    Call MD for:  severe uncontrolled pain   Complete by: As directed    Diet - low sodium heart healthy   Complete by: As directed    Discharge instructions   Complete by: As directed    F/u PCP F/u Neurologist in 1-2 weeks   Discharge wound care:   Complete by: As directed    As above   Increase activity slowly   Complete by: As directed  The results of significant diagnostics from this hospitalization (including imaging, microbiology, ancillary and laboratory) are listed below for reference.    Significant Diagnostic Studies: ECHOCARDIOGRAM COMPLETE BUBBLE STUDY  Result Date: 02/15/2023    ECHOCARDIOGRAM REPORT   Patient Name:   BENTLIE MANSKER Date of Exam: 02/15/2023 Medical Rec #:  [id number]    Height:       65.0 in Accession #:    [id number]   Weight:       180.8 lb Date of Birth:  1930/06/10    BSA:          1.895 m Patient Age:    89 years     BP:           122/80 mmHg Patient Gender: M            HR:           87 bpm. Exam Location:  [facility] Procedure: 2D Echo, Cardiac Doppler, Color Doppler and Saline Contrast Bubble            Study Indications:     Stroke 434.91 /I63.9  History:         Patient has no prior history of Echocardiogram examinations.                  Risk Factors:Dyslipidemia and Hypertension.  Sonographer:     Cristela Blue Referring Phys:  [id number] Tresa Moore Diagnosing Phys: Arvilla Meres MD IMPRESSIONS  1. Left ventricular ejection  fraction, by estimation, is 60 to 65%. The left ventricle has normal function. The left ventricle has no regional wall motion abnormalities. There is mild concentric left ventricular hypertrophy of the basal-septal segment. Left ventricular diastolic parameters are consistent with Grade I diastolic dysfunction (impaired relaxation).  2. Right ventricular systolic function is normal. The right ventricular size is normal.  3. The mitral valve is normal in structure. Trivial mitral valve regurgitation. No evidence of mitral stenosis. Moderate mitral annular calcification.  4. The aortic valve is tricuspid. There is mild calcification of the aortic valve. Aortic valve regurgitation is not visualized. Mild aortic valve stenosis.  5. Aortic dilatation noted. There is borderline dilatation of the aortic root, measuring 38 mm.  6. Agitated saline contrast bubble study was negative, with no evidence of any interatrial shunt. FINDINGS  Left Ventricle: Left ventricular ejection fraction, by estimation, is 60 to 65%. The left ventricle has normal function. The left ventricle has no regional wall motion abnormalities. The left ventricular internal cavity size was normal in size. There is  mild concentric left ventricular hypertrophy of the basal-septal segment. Left ventricular diastolic parameters are consistent with Grade I diastolic dysfunction (impaired relaxation). Right Ventricle: The right ventricular size is normal. No increase in right ventricular wall thickness. Right ventricular systolic function is normal. Left Atrium: Left atrial size was normal in size. Right Atrium: Right atrial size was normal in size. Pericardium: There is no evidence of pericardial effusion. Mitral Valve: The mitral valve is normal in structure. Moderate mitral annular calcification. Trivial mitral valve regurgitation. No evidence of mitral valve stenosis. MV peak gradient, 5.0 mmHg. The mean mitral valve gradient is 2.0 mmHg. Tricuspid Valve:  The tricuspid valve is normal in structure. Tricuspid valve regurgitation is trivial. No evidence of tricuspid stenosis. Aortic Valve: The aortic valve is tricuspid. There is mild calcification of the aortic valve. Aortic valve regurgitation is not visualized. Mild aortic stenosis is present. Aortic valve mean gradient measures 8.0 mmHg. Aortic valve peak gradient  measures  14.1 mmHg. Aortic valve area, by VTI measures 1.81 cm. Pulmonic Valve: The pulmonic valve was not well visualized. Pulmonic valve regurgitation is not visualized. No evidence of pulmonic stenosis. Aorta: Aortic dilatation noted. There is borderline dilatation of the aortic root, measuring 38 mm. Venous: The inferior vena cava was not well visualized. IAS/Shunts: No atrial level shunt detected by color flow Doppler. Agitated saline contrast was given intravenously to evaluate for intracardiac shunting. Agitated saline contrast bubble study was negative, with no evidence of any interatrial shunt.  LEFT VENTRICLE PLAX 2D LVIDd:         3.70 cm   Diastology LVIDs:         2.20 cm   LV e' medial:    5.22 cm/s LV PW:         1.10 cm   LV E/e' medial:  11.4 LV IVS:        1.40 cm   LV e' lateral:   6.53 cm/s LVOT diam:     2.10 cm   LV E/e' lateral: 9.1 LV SV:         60 LV SV Index:   32 LVOT Area:     3.46 cm  RIGHT VENTRICLE RV Basal diam:  2.60 cm RV Mid diam:    2.40 cm RV S prime:     14.50 cm/s TAPSE (M-mode): 1.5 cm LEFT ATRIUM             Index        RIGHT ATRIUM           Index LA diam:        2.90 cm 1.53 cm/m   RA Area:     11.20 cm LA Vol (A2C):   60.3 ml 31.82 ml/m  RA Volume:   21.10 ml  11.13 ml/m LA Vol (A4C):   51.9 ml 27.39 ml/m LA Biplane Vol: 60.8 ml 32.08 ml/m  AORTIC VALVE AV Area (Vmax):    1.59 cm AV Area (Vmean):   1.60 cm AV Area (VTI):     1.81 cm AV Vmax:           188.00 cm/s AV Vmean:          125.333 cm/s AV VTI:            0.333 m AV Peak Grad:      14.1 mmHg AV Mean Grad:      8.0 mmHg LVOT Vmax:          86.20 cm/s LVOT Vmean:        57.900 cm/s LVOT VTI:          0.174 m LVOT/AV VTI ratio: 0.52  AORTA Ao Root diam: 3.80 cm MITRAL VALVE               TRICUSPID VALVE MV Area (PHT): 4.89 cm    TR Peak grad:   20.2 mmHg MV Area VTI:   2.07 cm    TR Vmax:        225.00 cm/s MV Peak grad:  5.0 mmHg MV Mean grad:  2.0 mmHg    SHUNTS MV Vmax:       1.12 m/s    Systemic VTI:  0.17 m MV Vmean:      60.0 cm/s   Systemic Diam: 2.10 cm MV Decel Time: 155 msec MV E velocity: 59.70 cm/s MV A velocity: 99.60 cm/s MV E/A ratio:  0.60 Arvilla Meres MD Electronically signed by Arvilla Meres  MD Signature Date/Time: 02/15/2023/4:14:03 PM    Final    CT ABDOMEN PELVIS WO CONTRAST  Result Date: 02/14/2023 CLINICAL DATA:  Acute, nonlocalized abdominal pain EXAM: CT ABDOMEN AND PELVIS WITHOUT CONTRAST TECHNIQUE: Multidetector CT imaging of the abdomen and pelvis was performed following the standard protocol without IV contrast. RADIATION DOSE REDUCTION: This exam was performed according to the departmental dose-optimization program which includes automated exposure control, adjustment of the mA and/or kV according to patient size and/or use of iterative reconstruction technique. COMPARISON:  None Available. FINDINGS: Lower chest: Atheromatous calcification and small to moderate hiatal hernia. Subpleural reticulation in the lower lungs attributed to scarring/fibrosis. Hepatobiliary: No focal liver abnormality.Rounded gas bubbles in the gallbladder which could be within calculi or refluxed. No evidence of acute cholecystitis. Pancreas: Generalized atrophy Spleen: Unremarkable. Adrenals/Urinary Tract: Negative adrenals. Minimal high-density within the collecting system related to recent CTA with continued excretion. There are bilateral renal cystic densities measuring up to 7 cm at the left lower pole where there is a thin benign mural calcification. No follow-up imaging is recommended. Unremarkable bladder. Stomach/Bowel:  No  obstruction. No visible bowel inflammation. Vascular/Lymphatic: No acute vascular abnormality. Extensive atheromatous calcification. Fusiform infrarenal abdominal aorta measuring 3.5 cm in diameter. No mass or adenopathy. Reproductive:Enlarged prostate measuring 5 cm anterior to posterior and projecting into the bladder base. Other: No ascites or pneumoperitoneum. Musculoskeletal: No acute abnormalities. Remote T12 and L1 endplate fractures. Generalized spondylitic spurring. IMPRESSION: 1. No acute finding. 2. Gas bubbles in the gallbladder which could be within calculi or refluxed. No detected right upper quadrant inflammation. 3. Atherosclerosis with infrarenal abdominal aortic aneurysm measuring up to 3.5 cm. Recommend follow-up ultrasound every 2 years. This recommendation follows ACR consensus guidelines: White Paper of the ACR Incidental Findings Committee II on Vascular Findings. J Am Coll Radiol 2013; 10:789-794. Electronically Signed   By: Tiburcio Pea M.D.   On: 02/14/2023 10:56   MR BRAIN WO CONTRAST  Result Date: 02/12/2023 CLINICAL DATA:  Neuro deficit, acute, stroke suspected. Patient was found on the bathroom floor. Family states mild left-sided weakness. EXAM: MRI HEAD WITHOUT CONTRAST TECHNIQUE: Multiplanar, multiecho pulse sequences of the brain and surrounding structures were obtained without intravenous contrast. COMPARISON:  CT head without contrast 02/12/2019 2:15 p.m. FINDINGS: Brain: A 6 mm acute nonhemorrhagic infarct is present within the superior right thalamus. A lesion in the right centrum semi ovale demonstrates peripheral diffusion changes measuring 7 mm. A subcortical 4 mm acute infarct is present in the right corona radiata. No significant left-sided infarcts scratched at no acute left-sided infarcts are present. Advanced atrophy and diffuse white matter disease is present. Remote lacunar infarcts are present in basal ganglia bilaterally and both cerebellar hemispheres. Acute  hemorrhage or mass lesion is present. The ventricles are proportionate to the degree of atrophy. Brainstem is unremarkable. The internal auditory canals are within normal limits. Midline structures are within normal limits. Vascular: Flow is present in the major intracranial arteries. Skull and upper cervical spine: The craniocervical junction is normal. Upper cervical spine is within normal limits. Marrow signal is unremarkable. Sinuses/Orbits: Minimal fluid is present and posterior ethmoid air cells bilaterally. A small left mastoid effusion is present. Bilateral lens replacements are noted. Globes and orbits are otherwise unremarkable. IMPRESSION: 1. 6 mm acute nonhemorrhagic infarct involving the superior right thalamus. 2. 7 mm acute/subacute nonhemorrhagic infarct involving the right centrum semi ovale. 3. 4 mm subcortical acute infarct in the right corona radiata. 4. Multiple remote lacunar infarcts involving the  basal ganglia, thalami and bilateral cerebellum. 5. Advanced atrophy and diffuse white matter disease likely reflects the sequela of chronic microvascular ischemia. Electronically Signed   By: Marin Roberts M.D.   On: 02/12/2023 21:37   CT ANGIO HEAD NECK W WO CM  Result Date: 02/12/2023 CLINICAL DATA:  Patient found down. Deficit, acute, stroke suspected. Left-sided weakness. EXAM: CT ANGIOGRAPHY HEAD AND NECK WITH AND WITHOUT CONTRAST TECHNIQUE: Multidetector CT imaging of the head and neck was performed using the standard protocol during bolus administration of intravenous contrast. Multiplanar CT image reconstructions and MIPs were obtained to evaluate the vascular anatomy. Carotid stenosis measurements (when applicable) are obtained utilizing NASCET criteria, using the distal internal carotid diameter as the denominator. RADIATION DOSE REDUCTION: This exam was performed according to the departmental dose-optimization program which includes automated exposure control, adjustment of the  mA and/or kV according to patient size and/or use of iterative reconstruction technique. CONTRAST:  75mL OMNIPAQUE IOHEXOL 350 MG/ML SOLN COMPARISON:  MR head without contrast 02/12/2023. CT head without contrast 02/12/2023. FINDINGS: CTA NECK FINDINGS Aortic arch: Atherosclerotic calcifications are present at the aortic arch and great vessel origins without focal stenosis greater than 50% relative to the more distal vessel. No aneurysm is present. Right carotid system: The right common carotid artery is somewhat tortuous. No focal stenosis is present. Calcified and noncalcified plaque is present at the right carotid bifurcation and proximal right ICA. The lumen is narrowed to 2.6 mm this compares with a more distal measurement of 6 mm. Mild tortuosity and additional distal mural calcifications are present without other focal stenosis. Left carotid system: The left common carotid artery demonstrates some mural calcification without focal stenosis. Dense calcifications are present at the left carotid bifurcation and proximal left ICA without focal stenosis. Moderate tortuosity is present in the cervical left ICA without focal stenosis. Vertebral arteries: The right vertebral artery is the dominant vessel. Both vertebral arteries originate from the subclavian arteries without significant stenosis. No significant stenosis is present either vertebral artery in the neck. Skeleton: Multilevel degenerative changes are present in the cervical spine. Slight anterolisthesis is present at C3-4 and C4-5. No focal osseous lesions are present. The patient is edentulous. Other neck: A heterogeneous right level 3 and level 4 neck mass is well circumscribed measuring 4.1 x 3.5 x 3.5 cm. Other focal cervical lesion is present. No significant cervical adenopathy is present. The submandibular and parotid glands and ducts are within normal limits. Upper chest: Mild dependent atelectasis is present both lung apices. Thoracic inlet is  within normal limits. Review of the MIP images confirms the above findings CTA HEAD FINDINGS Anterior circulation: Atherosclerotic calcifications are present within the cavernous internal carotid arteries bilaterally without focal stenosis. The ICA termini are within normal limits bilaterally. The left A1 segment is aplastic. An azygous right A2 segment fills from the right. M1 segments are normal bilaterally. The MCA bifurcations are within normal limits. MCA branch vessels are normal bilaterally. Distal ACA branch vessels are within normal limits. Posterior circulation: Atherosclerotic calcifications are present at the dural margin of the left vertebral artery. The PICA origins are visualized and normal. The vertebrobasilar junction and basilar artery are normal. Basilar artery terminates at the superior cerebellar arteries. Bilateral fetal type posterior cerebral arteries are present. Moderate proximal P2 segment stenoses are present. Tandem more high-grade P2 segment stenoses are present bilaterally, more proximal on the left. A high-grade stenosis of the inferior left P3 segment is noted. The distal PCA branch vessels opacify  bilaterally. Venous sinuses: The dural sinuses are patent. The straight sinus and deep cerebral veins are intact. Cortical veins are within normal limits. No significant vascular malformation is evident. Anatomic variants: None Review of the MIP images confirms the above findings IMPRESSION: 1. Moderate proximal P2 segment stenoses bilaterally. This may correspond with the acute right thalamic infarct. 2. Tandem more high-grade P2 segment stenoses bilaterally, more proximal on the left. 3. High-grade stenosis of the inferior left P3 segment. 4. Atherosclerotic changes at the carotid bifurcations bilaterally and cavernous internal carotid arteries bilaterally without focal stenosis. 5. 4.1 x 3.5 x 3.5 cm heterogeneous right level 3 and level 4 neck mass. The lesion is well-circumscribed.  Differential diagnosis includes a schwannoma. Other nerve sheath tumors are considered as well. This is somewhat inferior for paraganglioma. Malignancy is considered less likely. 6. Multilevel spondylosis of the cervical spine. 7.  Aortic Atherosclerosis (ICD10-I70.0). These results were called by telephone at the time of interpretation on 02/12/2023 at 9:27 pm to provider [facility], who verbally acknowledged these results. Electronically Signed   By: Marin Roberts M.D.   On: 02/12/2023 21:28   DG Humerus Left  Result Date: 02/12/2023 CLINICAL DATA:  Arm pain after fall EXAM: LEFT HUMERUS - 2+ VIEW COMPARISON:  None Available. FINDINGS: No fracture or dislocation. Soft tissues are unremarkable. Probable calcific tendinopathy at the humeral head IMPRESSION: No acute osseous abnormality Electronically Signed   By: Jasmine Pang M.D.   On: 02/12/2023 15:59   DG Chest 1 View  Result Date: 02/12/2023 CLINICAL DATA:  Pain left chest wall EXAM: CHEST  1 VIEW COMPARISON:  01/25/2023 FINDINGS: No acute airspace disease or effusion. Stable cardiomediastinal silhouette with aortic atherosclerosis. No pneumothorax. IMPRESSION: No active disease. Electronically Signed   By: Jasmine Pang M.D.   On: 02/12/2023 15:58   DG Hip Unilat W or Wo Pelvis 2-3 Views Left  Result Date: 02/12/2023 CLINICAL DATA:  Left hip pain after fall EXAM: DG HIP (WITH OR WITHOUT PELVIS) 2-3V LEFT COMPARISON:  None Available. FINDINGS: SI joint are non widened. Pubic symphysis and rami appear intact. No fracture or malalignment. Mild hip degenerative change IMPRESSION: No acute osseous abnormality. Electronically Signed   By: Jasmine Pang M.D.   On: 02/12/2023 15:57   CT Head Wo Contrast  Result Date: 02/12/2023 CLINICAL DATA:  Head trauma found on bathroom floor EXAM: CT HEAD WITHOUT CONTRAST CT CERVICAL SPINE WITHOUT CONTRAST TECHNIQUE: Multidetector CT imaging of the head and cervical spine was performed following the  standard protocol without intravenous contrast. Multiplanar CT image reconstructions of the cervical spine were also generated. RADIATION DOSE REDUCTION: This exam was performed according to the departmental dose-optimization program which includes automated exposure control, adjustment of the mA and/or kV according to patient size and/or use of iterative reconstruction technique. COMPARISON:  CT brain 01/25/2023, CT brain and cervical spine 05/19/2021 FINDINGS: CT HEAD FINDINGS Brain: No acute territorial infarction, hemorrhage or intracranial mass. Atrophy and extensive chronic small vessel ischemic changes of the white matter. Small chronic cerebellar infarcts. Chronic lacunar infarcts within the thalamus and basal ganglia. Stable ventricle size. Vascular: No hyperdense vessels.  Carotid vascular calcification Skull: Normal. Negative for fracture or focal lesion. Sinuses/Orbits: Mild mucosal thickening in the sinuses. Incompletely visualized expansile lytic lesion within the anterior maxilla, this measures 2.4 cm. Other: None CT CERVICAL SPINE FINDINGS Alignment: Reversal of cervical lordosis. Trace anterolisthesis C3 on C4, C4 on C5 and C7 on T1. Trace retrolisthesis C5 on C6. Facet alignment  is within normal limits Skull base and vertebrae: No acute fracture. No primary bone lesion or focal pathologic process. Soft tissues and spinal canal: No prevertebral fluid or swelling. No visible canal hematoma. Disc levels: Advanced multilevel degenerative change. Severe disc space narrowing C4-C5, C5-C6, C6-C7 and C7-T1. Facet degenerative changes at multiple levels with foraminal narrowing Upper chest: Lung apices are clear. 3.8 x 3.6 cm right supraclavicular mass, stable to slightly enlarged, correlate with prior biopsy results. Other: None IMPRESSION: 1. No CT evidence for acute intracranial abnormality. Atrophy and extensive chronic small vessel ischemic changes of the white matter. 2. Reversal of cervical lordosis  with advanced multilevel degenerative change. No acute osseous abnormality. 3. 3.8 cm right supraclavicular mass, stable to slightly enlarged. Correlate with prior biopsy results 4. Incompletely visualized expansile lytic lesion within the anterior maxilla. This may be evaluated with nonemergent facial CT and potential oral surgery consultation. Electronically Signed   By: Jasmine Pang M.D.   On: 02/12/2023 15:56   CT Cervical Spine Wo Contrast  Result Date: 02/12/2023 CLINICAL DATA:  Head trauma found on bathroom floor EXAM: CT HEAD WITHOUT CONTRAST CT CERVICAL SPINE WITHOUT CONTRAST TECHNIQUE: Multidetector CT imaging of the head and cervical spine was performed following the standard protocol without intravenous contrast. Multiplanar CT image reconstructions of the cervical spine were also generated. RADIATION DOSE REDUCTION: This exam was performed according to the departmental dose-optimization program which includes automated exposure control, adjustment of the mA and/or kV according to patient size and/or use of iterative reconstruction technique. COMPARISON:  CT brain 01/25/2023, CT brain and cervical spine 05/19/2021 FINDINGS: CT HEAD FINDINGS Brain: No acute territorial infarction, hemorrhage or intracranial mass. Atrophy and extensive chronic small vessel ischemic changes of the white matter. Small chronic cerebellar infarcts. Chronic lacunar infarcts within the thalamus and basal ganglia. Stable ventricle size. Vascular: No hyperdense vessels.  Carotid vascular calcification Skull: Normal. Negative for fracture or focal lesion. Sinuses/Orbits: Mild mucosal thickening in the sinuses. Incompletely visualized expansile lytic lesion within the anterior maxilla, this measures 2.4 cm. Other: None CT CERVICAL SPINE FINDINGS Alignment: Reversal of cervical lordosis. Trace anterolisthesis C3 on C4, C4 on C5 and C7 on T1. Trace retrolisthesis C5 on C6. Facet alignment is within normal limits Skull base and  vertebrae: No acute fracture. No primary bone lesion or focal pathologic process. Soft tissues and spinal canal: No prevertebral fluid or swelling. No visible canal hematoma. Disc levels: Advanced multilevel degenerative change. Severe disc space narrowing C4-C5, C5-C6, C6-C7 and C7-T1. Facet degenerative changes at multiple levels with foraminal narrowing Upper chest: Lung apices are clear. 3.8 x 3.6 cm right supraclavicular mass, stable to slightly enlarged, correlate with prior biopsy results. Other: None IMPRESSION: 1. No CT evidence for acute intracranial abnormality. Atrophy and extensive chronic small vessel ischemic changes of the white matter. 2. Reversal of cervical lordosis with advanced multilevel degenerative change. No acute osseous abnormality. 3. 3.8 cm right supraclavicular mass, stable to slightly enlarged. Correlate with prior biopsy results 4. Incompletely visualized expansile lytic lesion within the anterior maxilla. This may be evaluated with nonemergent facial CT and potential oral surgery consultation. Electronically Signed   By: Jasmine Pang M.D.   On: 02/12/2023 15:56   DG Chest Portable 1 View  Result Date: 01/25/2023 CLINICAL DATA:  Shortness of breath EXAM: PORTABLE CHEST 1 VIEW COMPARISON:  Radiograph 05/19/2021 FINDINGS: Stable cardiomegaly. Aortic atherosclerotic calcification. Pulmonary vascular congestion and increased interstitial coarsening compared with 05/19/2021. Bibasilar atelectasis or scarring. No pleural effusion or  pneumothorax. IMPRESSION: Findings suggestive of CHF with mild interstitial edema. Electronically Signed   By: Minerva Fester M.D.   On: 01/25/2023 19:43   CT HEAD WO CONTRAST ( )  Result Date: 01/25/2023 CLINICAL DATA:  Neuro deficit with acute stroke suspected. Urinary frequency with poor smell. EXAM: CT HEAD WITHOUT CONTRAST TECHNIQUE: Contiguous axial images were obtained from the base of the skull through the vertex without intravenous contrast.  RADIATION DOSE REDUCTION: This exam was performed according to the departmental dose-optimization program which includes automated exposure control, adjustment of the mA and/or kV according to patient size and/or use of iterative reconstruction technique. COMPARISON:  05/19/2021 FINDINGS: Brain: Brain atrophy. There is some degree of callosum angle narrowing and disproportionate subarachnoid spaces but the degree of ventriculomegaly is congruent with the degree of atrophy. Confluent chronic small vessel ischemia in the cerebral white matter with chronic bilateral thalamic infarct. Small bilateral cerebellar infarcts. No evidence of acute infarct, hemorrhage, obstructive hydrocephalus, or mass. Vascular: No hyperdense vessel or unexpected calcification. Skull: Normal. Negative for fracture or focal lesion. Sinuses/Orbits: No acute finding. IMPRESSION: 1. No acute finding. 2. Advanced atrophy and chronic small vessel disease. Electronically Signed   By: Tiburcio Pea M.D.   On: 01/25/2023 18:45    Microbiology: Recent Results (from the past 240 hour(s))  SARS Coronavirus 2 by RT PCR (hospital order, performed in [facility] hospital lab) *cepheid single result test* Anterior Nasal Swab     Status: None   Collection Time: 02/12/23  3:10 PM   Specimen: Anterior Nasal Swab  Result Value Ref Range Status   SARS Coronavirus 2 by RT PCR NEGATIVE NEGATIVE Final    Comment: Performed at [facility], [address] [place], [place] [address]     Labs: CBC: Recent Labs  Lab 02/12/23 1400 02/13/23 0540 02/14/23 0831 02/18/23 0810  WBC 10.4 8.1 6.2 6.0  NEUTROABS 9.1*  --  4.1  --   HGB 15.8 14.4 13.0 12.3*  HCT 47.1 43.4 38.9* 37.5*  MCV 89.9 90.6 90.7 92.4  PLT 237 206 193 228   Basic Metabolic Panel: Recent Labs  Lab 02/12/23 1400 02/13/23 0540 02/14/23 0831 02/16/23 0818 02/18/23 0810 02/19/23 0349  NA 138 136 139 139 138  --   K 3.9 3.6 4.0 3.7 3.6  --   CL 105 106 107 110  106  --   CO2 23 22 23 24 25   --   GLUCOSE 126* 94 90 91 91  --   BUN 16 13 15 16 19   --   CREATININE 1.09 0.99 1.11 1.00 1.09 1.00  CALCIUM 9.3 8.6* 8.7* 8.8* 8.6*  --   MG  --   --   --  2.2 2.1  --   PHOS  --   --   --   --  3.4  --    Liver Function Tests: Recent Labs  Lab 02/12/23 1400 02/13/23 0540  AST 47* 44*  ALT 22 22  ALKPHOS 80 65  BILITOT 1.0 1.4*  PROT 7.8 6.7  ALBUMIN 4.2 3.6   Recent Labs  Lab 02/12/23 1400  LIPASE 21   No results for input(s): "AMMONIA" in the last 168 hours. Cardiac Enzymes: Recent Labs  Lab 02/12/23 1400 02/13/23 0537 02/15/23 1251  CKTOTAL 1,410* 1,152* 206   BNP (last 3 results) Recent Labs    01/25/23 1357  BNP 117.9*   CBG: No results for input(s): "GLUCAP" in the last 168 hours.  Time spent:  35 minutes  Signed:  Gillis Santa  [facility]  02/19/2023 9:52 AM

## 2023-02-19 NOTE — Care Management Important Message (Signed)
Important Message  Patient Details  Name: Thomas [name] MRN: [id number] Date of Birth: 06/07/1930   Important Message Given:  Yes - Medicare IM     Olegario Messier A Andreika Vandagriff 02/19/2023, 10:12 AM

## 2023-03-04 NOTE — Plan of Care (Signed)
[facility] Tonsillectomy/Adenoidectomy, Postoperative PEDS care plan entered in error.

## 2023-03-11 ENCOUNTER — Other Ambulatory Visit: Payer: Self-pay

## 2023-03-11 ENCOUNTER — Emergency Department: Payer: Medicare (Managed Care)

## 2023-03-11 ENCOUNTER — Inpatient Hospital Stay
Admission: EM | Admit: 2023-03-11 | Discharge: 2023-04-19 | DRG: 037 | Disposition: A | Discharge status: Skilled Nursing Facility | Payer: Medicare (Managed Care) | Attending: Student | Admitting: Student

## 2023-03-11 DIAGNOSIS — I2510 Atherosclerotic heart disease of native coronary artery without angina pectoris: Secondary | ICD-10-CM | POA: Diagnosis present

## 2023-03-11 DIAGNOSIS — Z808 Family history of malignant neoplasm of other organs or systems: Secondary | ICD-10-CM

## 2023-03-11 DIAGNOSIS — K219 Gastro-esophageal reflux disease without esophagitis: Secondary | ICD-10-CM | POA: Diagnosis present

## 2023-03-11 DIAGNOSIS — F05 Delirium due to known physiological condition: Secondary | ICD-10-CM | POA: Diagnosis not present

## 2023-03-11 DIAGNOSIS — I639 Cerebral infarction, unspecified: Secondary | ICD-10-CM | POA: Diagnosis not present

## 2023-03-11 DIAGNOSIS — D649 Anemia, unspecified: Secondary | ICD-10-CM | POA: Diagnosis present

## 2023-03-11 DIAGNOSIS — Z7982 Long term (current) use of aspirin: Secondary | ICD-10-CM

## 2023-03-11 DIAGNOSIS — T8383XA Hemorrhage of genitourinary prosthetic devices, implants and grafts, initial encounter: Secondary | ICD-10-CM | POA: Diagnosis not present

## 2023-03-11 DIAGNOSIS — Z79899 Other long term (current) drug therapy: Secondary | ICD-10-CM

## 2023-03-11 DIAGNOSIS — E860 Dehydration: Secondary | ICD-10-CM | POA: Diagnosis not present

## 2023-03-11 DIAGNOSIS — N472 Paraphimosis: Secondary | ICD-10-CM | POA: Diagnosis not present

## 2023-03-11 DIAGNOSIS — Z801 Family history of malignant neoplasm of trachea, bronchus and lung: Secondary | ICD-10-CM

## 2023-03-11 DIAGNOSIS — Z8249 Family history of ischemic heart disease and other diseases of the circulatory system: Secondary | ICD-10-CM

## 2023-03-11 DIAGNOSIS — N4889 Other specified disorders of penis: Secondary | ICD-10-CM | POA: Diagnosis present

## 2023-03-11 DIAGNOSIS — Z800 Family history of malignant neoplasm of digestive organs: Secondary | ICD-10-CM

## 2023-03-11 DIAGNOSIS — G928 Other toxic encephalopathy: Secondary | ICD-10-CM | POA: Diagnosis not present

## 2023-03-11 DIAGNOSIS — J322 Chronic ethmoidal sinusitis: Secondary | ICD-10-CM | POA: Diagnosis present

## 2023-03-11 DIAGNOSIS — I10 Essential (primary) hypertension: Secondary | ICD-10-CM | POA: Diagnosis present

## 2023-03-11 DIAGNOSIS — K5900 Constipation, unspecified: Secondary | ICD-10-CM | POA: Diagnosis present

## 2023-03-11 DIAGNOSIS — Y658 Other specified misadventures during surgical and medical care: Secondary | ICD-10-CM | POA: Diagnosis not present

## 2023-03-11 DIAGNOSIS — R001 Bradycardia, unspecified: Secondary | ICD-10-CM | POA: Diagnosis not present

## 2023-03-11 DIAGNOSIS — Z66 Do not resuscitate: Secondary | ICD-10-CM | POA: Diagnosis present

## 2023-03-11 DIAGNOSIS — K8000 Calculus of gallbladder with acute cholecystitis without obstruction: Secondary | ICD-10-CM | POA: Diagnosis not present

## 2023-03-11 DIAGNOSIS — R1114 Bilious vomiting: Secondary | ICD-10-CM | POA: Diagnosis not present

## 2023-03-11 DIAGNOSIS — I672 Cerebral atherosclerosis: Secondary | ICD-10-CM | POA: Diagnosis present

## 2023-03-11 DIAGNOSIS — I671 Cerebral aneurysm, nonruptured: Secondary | ICD-10-CM | POA: Diagnosis present

## 2023-03-11 DIAGNOSIS — R338 Other retention of urine: Secondary | ICD-10-CM | POA: Diagnosis not present

## 2023-03-11 DIAGNOSIS — I6523 Occlusion and stenosis of bilateral carotid arteries: Secondary | ICD-10-CM | POA: Diagnosis present

## 2023-03-11 DIAGNOSIS — N179 Acute kidney failure, unspecified: Secondary | ICD-10-CM | POA: Diagnosis not present

## 2023-03-11 DIAGNOSIS — Z7902 Long term (current) use of antithrombotics/antiplatelets: Secondary | ICD-10-CM

## 2023-03-11 DIAGNOSIS — R531 Weakness: Principal | ICD-10-CM

## 2023-03-11 DIAGNOSIS — R310 Gross hematuria: Secondary | ICD-10-CM | POA: Diagnosis not present

## 2023-03-11 DIAGNOSIS — Z8673 Personal history of transient ischemic attack (TIA), and cerebral infarction without residual deficits: Secondary | ICD-10-CM

## 2023-03-11 DIAGNOSIS — M47812 Spondylosis without myelopathy or radiculopathy, cervical region: Secondary | ICD-10-CM | POA: Diagnosis present

## 2023-03-11 DIAGNOSIS — I771 Stricture of artery: Secondary | ICD-10-CM | POA: Diagnosis present

## 2023-03-11 DIAGNOSIS — N401 Enlarged prostate with lower urinary tract symptoms: Secondary | ICD-10-CM | POA: Diagnosis present

## 2023-03-11 DIAGNOSIS — I6359 Cerebral infarction due to unspecified occlusion or stenosis of other cerebral artery: Principal | ICD-10-CM | POA: Diagnosis present

## 2023-03-11 DIAGNOSIS — R9431 Abnormal electrocardiogram [ECG] [EKG]: Secondary | ICD-10-CM | POA: Diagnosis not present

## 2023-03-11 DIAGNOSIS — Z96653 Presence of artificial knee joint, bilateral: Secondary | ICD-10-CM | POA: Diagnosis present

## 2023-03-11 DIAGNOSIS — R260 Ataxic gait: Secondary | ICD-10-CM | POA: Diagnosis present

## 2023-03-11 DIAGNOSIS — R5381 Other malaise: Secondary | ICD-10-CM | POA: Diagnosis present

## 2023-03-11 DIAGNOSIS — R29706 NIHSS score 6: Secondary | ICD-10-CM | POA: Diagnosis present

## 2023-03-11 DIAGNOSIS — R6881 Early satiety: Secondary | ICD-10-CM | POA: Diagnosis present

## 2023-03-11 DIAGNOSIS — K810 Acute cholecystitis: Secondary | ICD-10-CM

## 2023-03-11 DIAGNOSIS — Z1152 Encounter for screening for COVID-19: Secondary | ICD-10-CM

## 2023-03-11 DIAGNOSIS — E785 Hyperlipidemia, unspecified: Secondary | ICD-10-CM | POA: Diagnosis present

## 2023-03-11 DIAGNOSIS — A419 Sepsis, unspecified organism: Secondary | ICD-10-CM | POA: Diagnosis not present

## 2023-03-11 DIAGNOSIS — E876 Hypokalemia: Secondary | ICD-10-CM | POA: Diagnosis not present

## 2023-03-11 LAB — URINE DRUG SCREEN, QUALITATIVE (ARMC ONLY)
Amphetamines, Ur Screen: NOT DETECTED
Barbiturates, Ur Screen: NOT DETECTED
Benzodiazepine, Ur Scrn: NOT DETECTED
Cannabinoid 50 Ng, Ur ~~LOC~~: NOT DETECTED
Cocaine Metabolite,Ur ~~LOC~~: NOT DETECTED
MDMA (Ecstasy)Ur Screen: NOT DETECTED
Methadone Scn, Ur: NOT DETECTED
Opiate, Ur Screen: NOT DETECTED
Phencyclidine (PCP) Ur S: NOT DETECTED
Tricyclic, Ur Screen: NOT DETECTED

## 2023-03-11 LAB — DIFFERENTIAL
Abs Immature Granulocytes: 0.01 10*3/uL (ref 0.00–0.07)
Basophils Absolute: 0 10*3/uL (ref 0.0–0.1)
Basophils Relative: 0 %
Eosinophils Absolute: 0.3 10*3/uL (ref 0.0–0.5)
Eosinophils Relative: 5 %
Immature Granulocytes: 0 %
Lymphocytes Relative: 23 %
Lymphs Abs: 1.3 10*3/uL (ref 0.7–4.0)
Monocytes Absolute: 0.4 10*3/uL (ref 0.1–1.0)
Monocytes Relative: 7 %
Neutro Abs: 3.8 10*3/uL (ref 1.7–7.7)
Neutrophils Relative %: 65 %

## 2023-03-11 LAB — COMPREHENSIVE METABOLIC PANEL
ALT: 18 U/L (ref 0–44)
AST: 21 U/L (ref 15–41)
Albumin: 3.7 g/dL (ref 3.5–5.0)
Alkaline Phosphatase: 98 U/L (ref 38–126)
Anion gap: 9 (ref 5–15)
BUN: 12 mg/dL (ref 8–23)
CO2: 27 mmol/L (ref 22–32)
Calcium: 9.2 mg/dL (ref 8.9–10.3)
Chloride: 104 mmol/L (ref 98–111)
Creatinine, Ser: 0.94 mg/dL (ref 0.61–1.24)
GFR, Estimated: >60 mL/min (ref >60)
Glucose, Bld: 94 mg/dL (ref 70–99)
Potassium: 4 mmol/L (ref 3.5–5.1)
Sodium: 140 mmol/L (ref 135–145)
Total Bilirubin: 0.7 mg/dL (ref 0.3–1.2)
Total Protein: 6.8 g/dL (ref 6.5–8.1)

## 2023-03-11 LAB — URINALYSIS, ROUTINE W REFLEX MICROSCOPIC
Bilirubin Urine: NEGATIVE
Glucose, UA: NEGATIVE mg/dL
Hgb urine dipstick: NEGATIVE
Ketones, ur: NEGATIVE mg/dL
Leukocytes,Ua: NEGATIVE
Nitrite: NEGATIVE
Protein, ur: NEGATIVE mg/dL
Specific Gravity, Urine: 1.017 (ref 1.005–1.030)
pH: 7 (ref 5.0–8.0)

## 2023-03-11 LAB — CBC
HCT: 43.2 % (ref 39.0–52.0)
Hemoglobin: 14.2 g/dL (ref 13.0–17.0)
MCH: 30.3 pg (ref 26.0–34.0)
MCHC: 32.9 g/dL (ref 30.0–36.0)
MCV: 92.3 fL (ref 80.0–100.0)
Platelets: 206 10*3/uL (ref 150–400)
RBC: 4.68 MIL/uL (ref 4.22–5.81)
RDW: 13.8 % (ref 11.5–15.5)
WBC: 5.8 10*3/uL (ref 4.0–10.5)
nRBC: 0 % (ref 0.0–0.2)

## 2023-03-11 LAB — CBG MONITORING, ED: Glucose-Capillary: 103 mg/dL — ABNORMAL HIGH (ref 70–99)

## 2023-03-11 LAB — PROTIME-INR
INR: 1.1 (ref 0.8–1.2)
Prothrombin Time: 14.1 s (ref 11.4–15.2)

## 2023-03-11 LAB — SARS CORONAVIRUS 2 BY RT PCR: SARS Coronavirus 2 by RT PCR: NEGATIVE

## 2023-03-11 LAB — ETHANOL: Alcohol, Ethyl (B): <10 mg/dL (ref <10)

## 2023-03-11 LAB — APTT: aPTT: 32 s (ref 24–36)

## 2023-03-11 MED ORDER — ASPIRIN 81 MG PO TBEC
81.0000 mg | DELAYED_RELEASE_TABLET | Freq: Every day | ORAL | Status: DC
  Administered 2023-03-12 – 2023-04-03 (×22): 81 mg via ORAL
  Filled 2023-03-11 (×23): qty 1

## 2023-03-11 MED ORDER — HYDRALAZINE HCL 20 MG/ML IJ SOLN: 10.0000 mg | Freq: Four times a day (QID) | INTRAMUSCULAR | Status: DC | PRN

## 2023-03-11 MED ORDER — STROKE: EARLY STAGES OF RECOVERY BOOK: Freq: Once | Status: AC

## 2023-03-11 MED ORDER — ENOXAPARIN SODIUM 40 MG/0.4ML IJ SOSY
40.0000 mg | PREFILLED_SYRINGE | INTRAMUSCULAR | Status: DC
  Administered 2023-03-11 – 2023-03-17 (×7): 40 mg via SUBCUTANEOUS
  Filled 2023-03-11 (×7): qty 0.4

## 2023-03-11 MED ORDER — PANTOPRAZOLE SODIUM 40 MG PO TBEC
40.0000 mg | DELAYED_RELEASE_TABLET | Freq: Every day | ORAL | Status: DC
  Administered 2023-03-12 – 2023-03-18 (×7): 40 mg via ORAL
  Filled 2023-03-11 (×7): qty 1

## 2023-03-11 MED ORDER — SODIUM CHLORIDE 0.9 % IV BOLUS
500.0000 mL | Freq: Once | INTRAVENOUS | Status: AC
  Administered 2023-03-11: 500 mL via INTRAVENOUS

## 2023-03-11 MED ORDER — ACETAMINOPHEN 650 MG RE SUPP: 650.0000 mg | RECTAL | Status: DC | PRN

## 2023-03-11 MED ORDER — PRAVASTATIN SODIUM 20 MG PO TABS
10.0000 mg | ORAL_TABLET | Freq: Every day | ORAL | Status: DC
  Administered 2023-03-12 – 2023-03-18 (×7): 10 mg via ORAL
  Filled 2023-03-11 (×7): qty 1

## 2023-03-11 MED ORDER — ACETAMINOPHEN 160 MG/5ML PO SOLN: 650.0000 mg | ORAL | Status: DC | PRN

## 2023-03-11 MED ORDER — POLYETHYLENE GLYCOL 3350 17 G PO PACK
17.0000 g | PACK | Freq: Every day | ORAL | Status: DC
  Administered 2023-03-12 – 2023-03-18 (×7): 17 g via ORAL
  Filled 2023-03-11 (×7): qty 1

## 2023-03-11 MED ORDER — SODIUM CHLORIDE 0.9 % IV SOLN: INTRAVENOUS | Status: DC

## 2023-03-11 MED ORDER — BISACODYL 5 MG PO TBEC
10.0000 mg | DELAYED_RELEASE_TABLET | Freq: Every day | ORAL | Status: DC
  Administered 2023-03-12 – 2023-03-18 (×6): 10 mg via ORAL
  Filled 2023-03-11 (×8): qty 2

## 2023-03-11 MED ORDER — ACETAMINOPHEN 325 MG PO TABS
650.0000 mg | ORAL_TABLET | ORAL | Status: DC | PRN
  Administered 2023-03-18: 650 mg via ORAL
  Filled 2023-03-11: qty 2

## 2023-03-11 MED ORDER — CLOPIDOGREL BISULFATE 75 MG PO TABS
75.0000 mg | ORAL_TABLET | Freq: Every day | ORAL | Status: DC
  Administered 2023-03-12 – 2023-03-22 (×10): 75 mg via ORAL
  Filled 2023-03-11 (×10): qty 1

## 2023-03-11 MED ORDER — BISACODYL 10 MG RE SUPP: 10.0000 mg | Freq: Every day | RECTAL | Status: DC | PRN

## 2023-03-11 MED ORDER — SENNA 8.6 MG PO TABS: 1.0000 | ORAL_TABLET | Freq: Every evening | ORAL | Status: DC | PRN

## 2023-03-11 NOTE — ED Provider Notes (Signed)
Emergency department handoff note  Care of this patient was signed out to me at the end of the previous provider shift.  All pertinent patient information was conveyed and all questions were answered.  Patient pending MRI results that show a new acute thalamic stroke.  Given patient has failed the swallow study as well as had increasing weakness and inability to perform ADLs, he will require admission to the internal medicine service for further evaluation and management.   Merwyn Katos, MD 03/11/23 [phone]

## 2023-03-11 NOTE — H&P (Signed)
History and Physical    Thomas [name] MVH:[id number] DOB: [birth year] DOA: 03/11/2023  PCP: Barbette Reichmann, MD  Patient coming from: Home  I have personally briefly reviewed patient's old medical records in [facility]  Chief Complaint: Weakness and dizziness  HPI: Thomas [name] is a 89 y.o. male with medical history significant of hypertension, hyperlipidemia, GERD, recent CVA 3 weeks ago treated at [facility] was brought to the ED due to generalized weakness and dizziness being off balance x 1 day.  Patient came home from [facility] rehab 7 days ago after multifocal small vessel infarcts.  Was doing well at home.  Ambulating on his own with good functional status, able to perform basic ADLs.  1 night before presentation to [facility] family states the patient did not want to eat dinner and started complaining of dizziness.  The morning of presentation patient was having trouble with coordination unable to raise himself out of bed.  No evidence of trauma.  On presentation to the ED the patient is hemodynamically stable.  MRI reveals new acute CVA.  On my interview the patient is resting comfortably in bed.  Son-in-law at bedside.  Primary caregiver.  States that the patient was discharged on dual antiplatelet therapy aspirin and Plavix and has been compliant with all medications.   ED Course: CT and MRI findings as above.  Patient was started on intravenous fluids.  Hospitalist contacted for admission.  Review of Systems: As per HPI otherwise 14  point review of systems negative.    Past Medical History:  Diagnosis Date   Arthritis    Carpal tunnel syndrome on both sides    GERD (gastroesophageal reflux disease)    Hyperlipidemia    Hypertension     Past Surgical History:  Procedure Laterality Date   INGUINAL HERNIA REPAIR  2001   REPLACEMENT TOTAL KNEE Bilateral      reports that he has never smoked. He has never used smokeless tobacco. He reports that he does not drink alcohol and does not  use drugs.  No Known Allergies  Family History  Problem Relation Age of Onset   Cancer Sister        Liver cancer   Melanoma Sister    Cancer Brother        Pancreatic   Cancer Brother        Lung cancer   Heart disease Brother      Prior to Admission medications   Medication Sig Start Date End Date Taking? Authorizing Provider  acetaminophen (TYLENOL) 325 MG tablet Take 2 tablets (650 mg total) by mouth every 6 (six) hours as needed for fever, headache or moderate pain. 05/31/20   Alford Highland, MD  aspirin EC 81 MG tablet Take by mouth. Patient not taking: Reported on 05/20/2021    [provider]  bisacodyl (DULCOLAX) 10 MG suppository Place 1 suppository (10 mg total) rectally daily as needed for severe constipation. 02/19/23   Gillis Santa, MD  bisacodyl (DULCOLAX) 5 MG EC tablet Take 2 tablets (10 mg total) by mouth at bedtime. 02/19/23   Gillis Santa, MD  clopidogrel (PLAVIX) 75 MG tablet Take 1 tablet (75 mg total) by mouth daily. Total 90 days from Sep 16 to Dec 14th as per Neurologist 02/20/23 04/24/23  Gillis Santa, MD  cyanocobalamin (VITAMIN B12) 1000 MCG tablet Take 1,000 mcg by mouth daily. 09/19/21   [provider]  lovastatin (MEVACOR) 40 MG tablet Take by mouth. 03/14/12   [provider]  pantoprazole (PROTONIX) 40 MG tablet Take 1 tablet (40 mg total) by mouth daily. 02/20/23   Gillis Santa, MD  polyethylene glycol (MIRALAX / GLYCOLAX) 17 g packet Take 17 g by mouth daily. 02/19/23   Gillis Santa, MD  senna (SENOKOT) 8.6 MG TABS tablet Take 1 tablet (8.6 mg total) by mouth at bedtime as needed for mild constipation. 05/24/21   Pennie Banter, DO    Physical Exam: Vitals:   03/11/23 1400 03/11/23 1500 03/11/23 1530 03/11/23 1600  BP: (!) 158/74 (!) 152/65 (!) 143/74 (!) 152/78  Pulse:  (!) 52 (!) 55 (!) 54  Resp: 19 (!) 0 11 12  Temp:      TempSrc:      SpO2:  97% 96% 96%  Height:        Vitals:   03/11/23 1400 03/11/23  1500 03/11/23 1530 03/11/23 1600  BP: (!) 158/74 (!) 152/65 (!) 143/74 (!) 152/78  Pulse:  (!) 52 (!) 55 (!) 54  Resp: 19 (!) 0 11 12  Temp:      TempSrc:      SpO2:  97% 96% 96%  Height:       General: No apparent distress, patient appears well HEENT: Normocephalic, atraumatic Neck, supple, trachea midline, no tenderness Heart: Regular rate and rhythm, S1/S2 normal, no murmurs Lungs: Lungs clear.  Normal work of breathing.  Room air Abdomen: Soft, nontender, nondistended, positive bowel sounds Extremities: Normal, atraumatic, no clubbing or cyanosis, normal muscle tone Skin: No rashes or lesions, normal color Neurologic: Cranial nerves grossly intact.  Poor past-pointing.  Gait not assessed.  Oriented x 2. Psychiatric: Normal affect   Labs on Admission: I have personally reviewed following labs and imaging studies  CBC: Recent Labs  Lab 03/11/23 1202  WBC 5.8  NEUTROABS 3.8  HGB 14.2  HCT 43.2  MCV 92.3  PLT 206   Basic Metabolic Panel: Recent Labs  Lab 03/11/23 1202  NA 140  K 4.0  CL 104  CO2 27  GLUCOSE 94  BUN 12  CREATININE 0.94  CALCIUM 9.2   GFR: CrCl cannot be calculated (Unknown ideal weight.). Liver Function Tests: Recent Labs  Lab 03/11/23 1202  AST 21  ALT 18  ALKPHOS 98  BILITOT 0.7  PROT 6.8  ALBUMIN 3.7   No results for input(s): "LIPASE", "AMYLASE" in the last 168 hours. No results for input(s): "AMMONIA" in the last 168 hours. Coagulation Profile: Recent Labs  Lab 03/11/23 1359  INR 1.1   Cardiac Enzymes: No results for input(s): "CKTOTAL", "CKMB", "CKMBINDEX", "TROPONINI" in the last 168 hours. BNP (last 3 results) No results for input(s): "PROBNP" in the last 8760 hours. HbA1C: No results for input(s): "HGBA1C" in the last 72 hours. CBG: Recent Labs  Lab 03/11/23 1149  GLUCAP 103*   Lipid Profile: No results for input(s): "CHOL", "HDL", "LDLCALC", "TRIG", "CHOLHDL", "LDLDIRECT" in the last 72 hours. Thyroid Function  Tests: No results for input(s): "TSH", "T4TOTAL", "FREET4", "T3FREE", "THYROIDAB" in the last 72 hours. Anemia Panel: No results for input(s): "VITAMINB12", "FOLATE", "FERRITIN", "TIBC", "IRON", "RETICCTPCT" in the last 72 hours. Urine analysis:    Component Value Date/Time   COLORURINE YELLOW (A) 03/11/2023 1202   APPEARANCEUR CLEAR (A) 03/11/2023 1202   APPEARANCEUR Clear 10/19/2011 0819   LABSPEC 1.017 03/11/2023 1202   LABSPEC 1.016 10/19/2011 0819   PHURINE 7.0 03/11/2023 1202   GLUCOSEU NEGATIVE 03/11/2023 1202   GLUCOSEU Negative 10/19/2011 0819   HGBUR NEGATIVE 03/11/2023 1202  BILIRUBINUR NEGATIVE 03/11/2023 1202   BILIRUBINUR Negative 10/19/2011 0819   KETONESUR NEGATIVE 03/11/2023 1202   PROTEINUR NEGATIVE 03/11/2023 1202   NITRITE NEGATIVE 03/11/2023 1202   LEUKOCYTESUR NEGATIVE 03/11/2023 1202   LEUKOCYTESUR Negative 10/19/2011 0819    Radiological Exams on Admission: MR BRAIN WO CONTRAST  Result Date: 03/11/2023 CLINICAL DATA:  Transient ischemic attack (TIA). EXAM: MRI HEAD WITHOUT CONTRAST TECHNIQUE: Multiplanar, multiecho pulse sequences of the brain and surrounding structures were obtained without intravenous contrast. COMPARISON:  MRI brain 02/12/2023. FINDINGS: Brain: Acute infarct of the right anterior thalamus. No acute hemorrhage or significant mass effect. 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 No hydrocephalus or extra-axial collection. Vascular: Normal flow voids. Skull and upper cervical spine: Normal marrow signal. Sinuses/Orbits: No acute findings. Other: None. IMPRESSION: 1. Acute infarct of the right anterior thalamus. No acute hemorrhage or significant mass effect. 2. 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  Electronically Signed   By: Orvan Falconer M.D.   On: 03/11/2023 16:21   CT HEAD WO CONTRAST  Result Date: 03/11/2023 CLINICAL DATA:  Neuro deficit with acute stroke suspected EXAM: CT HEAD WITHOUT CONTRAST TECHNIQUE: Contiguous axial images were obtained from the base of the skull through the vertex without intravenous contrast. RADIATION DOSE REDUCTION: This exam was performed according to the departmental dose-optimization program which includes automated exposure control, adjustment of the mA and/or kV according to patient size and/or use of iterative reconstruction technique. COMPARISON:  Brain MRI 02/12/2023 FINDINGS: Brain: No evidence of acute infarction, hemorrhage, hydrocephalus, extra-axial collection or mass lesion/mass effect. Chronic small vessel ischemia in the cerebral white matter with chronic perforator infarcts in the deep gray nuclei and chronic. Small vessel infarcts in the bilateral cerebellum. There is brain atrophy that is pronounced with ventriculomegaly. Vascular: No hyperdense vessel or unexpected calcification. Skull: Normal. Negative for fracture or focal lesion. Sinuses/Orbits: No acute finding. IMPRESSION: Advanced chronic small vessel disease and brain atrophy. No acute superimposed finding. Electronically Signed   By: Tiburcio Pea M.D.   On: 03/11/2023 12:49    EKG: Independently reviewed.  Sinus rhythm  Assessment/Plan  Acute CVA Patient presents 3 weeks after previous hospitalization and discharged from [facility].  Now with evidence of new acute CVA on imaging.  Patient son-in-law at bedside reports compliance with dual antiplatelet therapy and antihyperlipidemic therapy at home. Plan: Place in observation Neurology consult Resume DAPT aspirin and Plavix N.p.o. until speech evaluation PT OT evaluations Cardiac monitoring for now  Essential hypertension Allow for permissive hypertension for the next 48 hours Do not treat unless SBP greater than  220  Hyperlipidemia Resume statin  GERD PPI  DVT prophylaxis: SQ Lovenox Code Status: DNR.  Discussed with son-in-law at bedside Family Communication: Son-in-law at bedside Disposition Plan: Anticipate discharge to [facility] Consults called: Neurology-Dr, Wilford Corner Admission status: Observation, medical telemetry   Tresa Moore MD [facility]  If 7PM-7AM, please contact night-coverage   03/11/2023, 4:45 PM

## 2023-03-11 NOTE — ED Provider Triage Note (Signed)
Emergency Medicine Provider Triage Evaluation Note  Thomas [name] , a 89 y.o. male  was evaluated in triage.  Pt complains of strokelike sx, last known well yesterday, recent stroke but was getting better, altered and weaker today.  Review of Systems  Positive:  Negative:   Physical Exam  BP 138/84 (BP Location: Left Arm)   Pulse 63   Temp 99 F (37.2 C) (Oral)   Resp 16   Ht 5\' 8"  (1.727 m)   SpO2 98%   BMI 24.94 kg/m  Gen:   Awake, no distress   Resp:  Normal effort  MSK:   In wheelchair Other:    Medical Decision Making  Medically screening exam initiated at 11:58 AM.  Appropriate orders placed.  [name] was informed that the remainder of the evaluation will be completed by another provider, this initial triage assessment does not replace that evaluation, and the importance of remaining in the ED until their evaluation is complete.     Faythe Ghee, PA-C 03/11/23 1159

## 2023-03-11 NOTE — ED Notes (Signed)
Notified nurse bill of change in B/P 127/101

## 2023-03-11 NOTE — ED Triage Notes (Signed)
Assisted from car.  Son in law reports patient with recent stroke.  Discharged from [facility] Friday and has been walking at home.  Went to bed last night at 2000 and called Son in [place] in this morning at around 0930 to go to the bathroom.  Patient had been incontinent of urine and was unable to stand/walk.    AAOx3.  Skin warm and dry. Speech clear.

## 2023-03-11 NOTE — ED Triage Notes (Signed)
Pt son reports pt having weakness and dizziness that started this morning when he woke up. Pt LKW is last night aroung 8pm. Pt has hx of CVA 3 weeks ago.

## 2023-03-11 NOTE — ED Provider Notes (Signed)
[facility] Provider Note    Event Date/Time   First MD Initiated Contact with Patient 03/11/23 1328     (approximate)   History   Chief Complaint: Weakness   HPI  Thomas [name] is a 89 y.o. male with a history of hypertension hyperlipidemia GERD and recent stroke 3 weeks ago he was brought to the ED due to generalized weakness and dizziness, being off balance this morning.  Came home from [facility] rehab 7 days ago after multifocal small vessel infarcts.  Was doing well, ambulating on his own with good functional status, able to get in and out of bed on his own. last night patient did not want to eat dinner, and at bedtime started complaining of dizziness.  This morning, was having trouble with his coordination, unable to get himself out of bed, and when his son tried to help him, the patient was unable to maintain his weight upright and fell back into the bed immediately.  No new trauma, no fever or neck pain.  No chest pain or shortness of breath.     Physical Exam   Triage Vital Signs: ED Triage Vitals  Encounter Vitals Group     BP 03/11/23 1151 138/84     Systolic BP Percentile --      Diastolic BP Percentile --      Pulse Rate 03/11/23 1151 63     Resp 03/11/23 1151 16     Temp 03/11/23 1151 99 F (37.2 C)     Temp Source 03/11/23 1151 Oral     SpO2 03/11/23 1151 98 %     Weight --      Height 03/11/23 1152 5\' 8"  (1.727 m)     Head Circumference --      Peak Flow --      Pain Score 03/11/23 1152 0     Pain Loc --      Pain Education --      Exclude from Growth Chart --     Most recent vital signs: Vitals:   03/11/23 1151 03/11/23 1400  BP: 138/84 (!) 158/74  Pulse: 63   Resp: 16 19  Temp: 99 F (37.2 C)   SpO2: 98%     General: Awake, no distress.  CV:  Good peripheral perfusion.  Regular rate and rhythm Resp:  Normal effort.  Clear to auscultation bilaterally Abd:  No distention.  Soft nontender Other:  Cranial nerves II through XII  intact.  No visual field deficit.  No motor drift.  Mild dysmetria on left upper extremity compared to right.  Normal speech and language.  Sensation intact and symmetric.  No neglect.   ED Results / Procedures / Treatments   Labs (all labs ordered are listed, but only abnormal results are displayed) Labs Reviewed  URINALYSIS, ROUTINE W REFLEX MICROSCOPIC - Abnormal; Notable for the following components:      Result Value   Color, Urine YELLOW (*)    APPearance CLEAR (*)    All other components within normal limits  CBG MONITORING, ED - Abnormal; Notable for the following components:   Glucose-Capillary 103 (*)    All other components within normal limits  SARS CORONAVIRUS 2 BY RT PCR  CBC  DIFFERENTIAL  URINE DRUG SCREEN, QUALITATIVE ([facility] ONLY)  PROTIME-INR  APTT  COMPREHENSIVE METABOLIC PANEL  ETHANOL     EKG Interpreted by me Sinus rhythm, rate of 58.  Left axis, first-degree AV block.  Normal QRS ST  segments and T waves   RADIOLOGY CT head interpreted by me, no intracranial hemorrhage or obvious acute stroke.  Radiology report reviewed   PROCEDURES:  Procedures   MEDICATIONS ORDERED IN ED: Medications  sodium chloride 0.9 % bolus 500 mL (500 mLs Intravenous New Bag/Given 03/11/23 1440)     IMPRESSION / MDM / ASSESSMENT AND PLAN / ED COURSE  I reviewed the triage vital signs and the nursing notes.  DDx: TIA, orthostatic symptoms, dehydration, electrolyte abnormality, acute stroke, intracranial hemorrhage  Patient's presentation is most consistent with acute presentation with potential threat to life or bodily function.  Patient presents with dizziness, balance difficulty this morning.  Metabolic workup with CBC, urinalysis unremarkable.  Serum chemistry panel pending.  CT head negative.  Possible cerebellar infarct, will obtain MRI brain.  Patient is outside of window for TNK consideration.  LVO screen negative, not a candidate for code stroke  protocol.  ----------------------------------------- 3:09 PM on 03/11/2023 ----------------------------------------- Awaiting MRI report.      FINAL CLINICAL IMPRESSION(S) / ED DIAGNOSES   Final diagnoses:  Generalized weakness     Rx / DC Orders   ED Discharge Orders     None        Note:  This document was prepared using Dragon voice recognition software and may include unintentional dictation errors.   Sharman Cheek, MD 03/11/23 [phone]

## 2023-03-12 ENCOUNTER — Observation Stay: Payer: Medicare (Managed Care)

## 2023-03-12 DIAGNOSIS — I639 Cerebral infarction, unspecified: Secondary | ICD-10-CM | POA: Diagnosis not present

## 2023-03-12 LAB — LIPID PANEL
Cholesterol: 147 mg/dL (ref 0–200)
HDL: 28 mg/dL — ABNORMAL LOW (ref >40)
LDL Cholesterol: 87 mg/dL (ref 0–99)
Total CHOL/HDL Ratio: 5.3 {ratio}
Triglycerides: 162 mg/dL — ABNORMAL HIGH (ref <150)
VLDL: 32 mg/dL (ref 0–40)

## 2023-03-12 MED ORDER — IOHEXOL 350 MG/ML SOLN
75.0000 mL | Freq: Once | INTRAVENOUS | Status: AC | PRN
  Administered 2023-03-12: 75 mL via INTRAVENOUS

## 2023-03-12 NOTE — TOC Initial Note (Signed)
Transition of Care [facility]) - Initial/Assessment Note    Patient Details  Name: Thomas [name] MRN: [id number] Date of Birth: [birth year]  [facility]) CM/SW Contact:    Allena Katz, LCSW Phone Number: 03/12/2023, 2:39 PM  Clinical Narrative:     Pt admitted with acute CVA after being discharged from [facility] 7 days ago.  Patient not fully oriented. Patient lives alone.  PCP is Dr. Marcello Fennel. [facility]acy [facility] [place]. Patient has a RW, cane, shower seat, and BSC. Family provides transportation to appointments. Pt has had Barbara Cower with [facility] in the past. CSW to call daughter to discuss rehab recommendation.   Expected Discharge Plan: Skilled Nursing Facility Barriers to Discharge: Continued Medical Work up   Patient Goals and CMS Choice Patient states their goals for this hospitalization and ongoing recovery are:: go to rehab          Expected Discharge Plan and Services                                              Prior Living Arrangements/Services   Lives with:: Adult Children Patient language and need for interpreter reviewed:: Yes            Current home services: DME    Activities of Daily Living      Permission Sought/Granted               Permission granted to share info w Contact Information: daughter  Emotional Assessment     Affect (typically observed): Accepting Orientation: : Fluctuating Orientation (Suspected and/or reported Sundowners)      Admission diagnosis:  Generalized weakness [R53.1] Acute CVA (cerebrovascular accident) [facility]) [I63.9] Patient Active Problem List   Diagnosis Date Noted   Acute CVA (cerebrovascular accident) (HCC) 02/13/2023   Rhabdomyolysis 02/12/2023   Hypophosphatemia 05/22/2021   Traumatic rhabdomyolysis (HCC)    Neuropathy    Rhabdomyolysis due to COVID-19 05/19/2021   Weakness    Acute respiratory failure with hypoxia (HCC)    Insomnia    Acute metabolic encephalopathy    COVID-19  virus infection 05/28/2020   GERD (gastroesophageal reflux disease)    Hypertension    HLD (hyperlipidemia)    Multiple lung nodules 12/05/2018   Mass of right side of neck 01/27/2018   PCP:  Barbette Reichmann, MD Pharmacy:   [phone] [place], [place] - [address] [place] [address] Phone: [phone] Fax: [phone]     Social Determinants of Health (SDOH) Social History: SDOH Screenings   Food Insecurity: No Food Insecurity (02/13/2023)  Housing: Low Risk  (02/13/2023)  Transportation Needs: No Transportation Needs (03/12/2023)   Received from [facility]  Utilities: Not At Risk (02/13/2023)  Financial Resource Strain: Low Risk  (10/12/2022)   Received from [facility]  Tobacco Use: Low Risk  (03/11/2023)  Health Literacy: High Risk (03/12/2023)   Received from [facility]   SDOH Interventions:     Readmission Risk Interventions     No data to display

## 2023-03-12 NOTE — Progress Notes (Signed)
PROGRESS NOTE    Thomas [name]  UJW:[id number] DOB: [birth year] DOA: 03/11/2023 PCP: Barbette Reichmann, MD    Brief Narrative:   89 y.o. male with medical history significant of hypertension, hyperlipidemia, GERD, recent CVA 3 weeks ago treated at [facility] was brought to the ED due to generalized weakness and dizziness being off balance x 1 day.  Patient came home from [facility] rehab 7 days ago after multifocal small vessel infarcts.  Was doing well at home.  Ambulating on his own with good functional status, able to perform basic ADLs.   1 night before presentation to [facility] family states the patient did not want to eat dinner and started complaining of dizziness.  The morning of presentation patient was having trouble with coordination unable to raise himself out of bed.  No evidence of trauma.   On presentation to the ED the patient is hemodynamically stable.  MRI reveals new acute CVA.  On my interview the patient is resting comfortably in bed.  Son-in-law at bedside.  Primary caregiver.  States that the patient was discharged on dual antiplatelet therapy aspirin and Plavix and has been compliant with all medications.  11/1: Patient seen in consultation by neurology who ordered CTA to evaluate carotid anatomy.  Pending CTA results and possible vascular surgery consult.  Pending PT and OT evaluations.    Assessment & Plan:   Principal Problem:   Acute CVA (cerebrovascular accident) (HCC)  Acute CVA Patient presents 3 weeks after previous hospitalization and discharged from [facility].  Now with evidence of new acute CVA on imaging.  Patient son-in-law at bedside reports compliance with dual antiplatelet therapy and antihyperlipidemic therapy at home. Seen in consultation by neurology Plan: CTA head and neck per neurology recommendations Possible vascular consult for carotid revascularization Continue DAPT Continue statin, lipid profile at goal PT and OT evaluations Diet advanced per SLP  recommendations Continue cardiac monitoring    Essential hypertension Allow for permissive hypertension for the next 48 hours Treat as needed systolic greater than 220   Hyperlipidemia PTA statin.  Lipid profile at goal   GERD PPI    DVT prophylaxis: SQ Lovenox Code Status: DNR Family Communication: None today.  Spoke with son-in-law at bedside 10/31 Disposition Plan: Status is: Observation The patient will require care spanning > 2 midnights and should be moved to inpatient because: Acute CVA.  Further workup in progress.   Level of care: Telemetry Medical  Consultants:  Neurology  Procedures:  None  Antimicrobials: None   Subjective: Seen and examined.  Sitting up in chair.  No visible distress.  States swallowing is improved.  States upper extremity strength is improved.  Objective: Vitals:   03/11/23 2120 03/12/23 0009 03/12/23 0506 03/12/23 0740  BP: (!) 152/78 (!) 143/79 133/70 (!) 168/96  Pulse: 62 (!) 56 (!) 54 61  Resp: 20 16 16 17   Temp: 98 F (36.7 C) 97.9 F (36.6 C) (!) 97.4 F (36.3 C) 97.7 F (36.5 C)  TempSrc:      SpO2: 96% 91% 95% 97%  Height:        Intake/Output Summary (Last 24 hours) at 03/12/2023 1128 Last data filed at 03/11/2023 1845 Gross per 24 hour  Intake 491.67 ml  Output 350 ml  Net 141.67 ml   There were no vitals filed for this visit.  Examination:  General exam: Appears calm and comfortable  Respiratory system: Clear to auscultation. Respiratory effort normal. Cardiovascular system: S1-2, RRR, no murmurs, no pedal edema Gastrointestinal system:  Soft, NT/ND, normal bowel sounds Central nervous system: Alert.  Oriented x 3.  Upper extremity weakness Extremities: Symmetric 5 x 5 power. Skin: No rashes, lesions or ulcers Psychiatry: Judgement and insight appear normal. Mood & affect appropriate.     Data Reviewed: I have personally reviewed following labs and imaging studies  CBC: Recent Labs  Lab  03/11/23 1202  WBC 5.8  NEUTROABS 3.8  HGB 14.2  HCT 43.2  MCV 92.3  PLT 206   Basic Metabolic Panel: Recent Labs  Lab 03/11/23 1202  NA 140  K 4.0  CL 104  CO2 27  GLUCOSE 94  BUN 12  CREATININE 0.94  CALCIUM 9.2   GFR: CrCl cannot be calculated (Unknown ideal weight.). Liver Function Tests: Recent Labs  Lab 03/11/23 1202  AST 21  ALT 18  ALKPHOS 98  BILITOT 0.7  PROT 6.8  ALBUMIN 3.7   No results for input(s): "LIPASE", "AMYLASE" in the last 168 hours. No results for input(s): "AMMONIA" in the last 168 hours. Coagulation Profile: Recent Labs  Lab 03/11/23 1359  INR 1.1   Cardiac Enzymes: No results for input(s): "CKTOTAL", "CKMB", "CKMBINDEX", "TROPONINI" in the last 168 hours. BNP (last 3 results) No results for input(s): "PROBNP" in the last 8760 hours. HbA1C: No results for input(s): "HGBA1C" in the last 72 hours. CBG: Recent Labs  Lab 03/11/23 1149  GLUCAP 103*   Lipid Profile: Recent Labs    03/12/23 0500  CHOL 147  HDL 28*  LDLCALC 87  TRIG 956*  CHOLHDL 5.3   Thyroid Function Tests: No results for input(s): "TSH", "T4TOTAL", "FREET4", "T3FREE", "THYROIDAB" in the last 72 hours. Anemia Panel: No results for input(s): "VITAMINB12", "FOLATE", "FERRITIN", "TIBC", "IRON", "RETICCTPCT" in the last 72 hours. Sepsis Labs: No results for input(s): "PROCALCITON", "LATICACIDVEN" in the last 168 hours.  Recent Results (from the past 240 hour(s))  SARS Coronavirus 2 by RT PCR (hospital order, performed in [facility] hospital lab) *cepheid single result test* Anterior Nasal Swab     Status: None   Collection Time: 03/11/23  1:59 PM   Specimen: Anterior Nasal Swab  Result Value Ref Range Status   SARS Coronavirus 2 by RT PCR NEGATIVE NEGATIVE Final    Comment: (NOTE) SARS-CoV-2 target nucleic acids are NOT DETECTED.  The SARS-CoV-2 RNA is generally detectable in upper and lower respiratory specimens during the acute phase of infection. The  lowest concentration of SARS-CoV-2 viral copies this assay can detect is 250 copies / mL. A negative result does not preclude SARS-CoV-2 infection and should not be used as the sole basis for treatment or other patient management decisions.  A negative result may occur with improper specimen collection / handling, submission of specimen other than nasopharyngeal swab, presence of viral mutation(s) within the areas targeted by this assay, and inadequate number of viral copies (<250 copies / mL). A negative result must be combined with clinical observations, patient history, and epidemiological information.  Fact Sheet for Patients:   RoadLapTop.co.za  Fact Sheet for Healthcare Providers: http://kim-miller.com/  This test is not yet approved or  cleared by the [place] FDA and has been authorized for detection and/or diagnosis of SARS-CoV-2 by FDA under an Emergency Use Authorization (EUA).  This EUA will remain in effect (meaning this test can be used) for the duration of the COVID-19 declaration under Section 564(b)(1) of the Act, 21 U.S.C. section 360bbb-3(b)(1), unless the authorization is terminated or revoked sooner.  Performed at [facility], [phone]  [address] [place], [place] [address]          Radiology Studies: MR BRAIN WO CONTRAST  Result Date: 03/11/2023 CLINICAL DATA:  Transient ischemic attack (TIA). EXAM: MRI HEAD WITHOUT CONTRAST TECHNIQUE: Multiplanar, multiecho pulse sequences of the brain and surrounding structures were obtained without intravenous contrast. COMPARISON:  MRI brain 02/12/2023. FINDINGS: Brain: Acute infarct of the right anterior thalamus. No acute hemorrhage or significant mass effect. 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 No hydrocephalus or extra-axial collection.  Vascular: Normal flow voids. Skull and upper cervical spine: Normal marrow signal. Sinuses/Orbits: No acute findings. Other: None. IMPRESSION: 1. Acute infarct of the right anterior thalamus. No acute hemorrhage or significant mass effect. 2. 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 Electronically Signed   By: Orvan Falconer M.D.   On: 03/11/2023 16:21   CT HEAD WO CONTRAST  Result Date: 03/11/2023 CLINICAL DATA:  Neuro deficit with acute stroke suspected EXAM: CT HEAD WITHOUT CONTRAST TECHNIQUE: Contiguous axial images were obtained from the base of the skull through the vertex without intravenous contrast. RADIATION DOSE REDUCTION: This exam was performed according to the departmental dose-optimization program which includes automated exposure control, adjustment of the mA and/or kV according to patient size and/or use of iterative reconstruction technique. COMPARISON:  Brain MRI 02/12/2023 FINDINGS: Brain: No evidence of acute infarction, hemorrhage, hydrocephalus, extra-axial collection or mass lesion/mass effect. Chronic small vessel ischemia in the cerebral white matter with chronic perforator infarcts in the deep gray nuclei and chronic. Small vessel infarcts in the bilateral cerebellum. There is brain atrophy that is pronounced with ventriculomegaly. Vascular: No hyperdense vessel or unexpected calcification. Skull: Normal. Negative for fracture or focal lesion. Sinuses/Orbits: No acute finding. IMPRESSION: Advanced chronic small vessel disease and brain atrophy. No acute superimposed finding. Electronically Signed   By: Tiburcio Pea M.D.   On: 03/11/2023 12:49        Scheduled Meds:  aspirin EC  81 mg Oral Daily   bisacodyl  10 mg Oral QHS   clopidogrel  75 mg Oral Daily   enoxaparin (LOVENOX) injection  40 mg Subcutaneous Q24H   pantoprazole  40 mg Oral Daily    polyethylene glycol  17 g Oral Daily   pravastatin  10 mg Oral q1800   Continuous Infusions:   LOS: 0 days     Tresa Moore, MD [facility]   If 7PM-7AM, please contact night-coverage  03/12/2023, 11:28 AM

## 2023-03-12 NOTE — Evaluation (Signed)
Physical Therapy Evaluation Patient Details Name: Thomas [name] MRN: [id number] DOB: [birth year] Today's Date: 03/12/2023  History of Present Illness  Pt is an 89 yo male with incoordination, trouble getting out of bed, and weakness the morning of presentation to the ED. MRI reveals new acute CVA. Per MD note, pt had a "recent CVA 3 weeks ago treated at [facility] was brought to the ED due to generalized weakness and dizziness being off balance x 1 day. Patient came home from [facility] rehab 7 days ago after multifocal small vessel infarcts. 1 night before presentation to [facility] family states the patient did not want to eat dinner and started complaining of dizziness." Other PMH includes HTN, hyperlipidemia, GERD, and B/L carpal tunnel.   Clinical Impression  Pt alert and disoriented to time, place, and situation, and unable to recall information t/o session. Pt denies pain. No caregiver or family present to clarify home setup or baseline PLOF/cognition, but per recent hospitalization pt lives alone in a one-story home with 2 STE, family available PRN, ambulates with RW , and able to perform basic ADLs. During session, pt very distractible and required constant sequencing cues. Pt performed bed mob with CGA and increased effort/time, STS with min-modAx2 and RW, and step pivot transfer with min-maxAx2 and RW. Pt presents with BLE incoordination, L hemiplegia, significant cognitive deficits, balance impairments, and fatigue. Pt would benefit from further skilled PT to address these functional deficits and progress toward mobility goals.       If plan is discharge home, recommend the following: Two people to help with walking and/or transfers;Two people to help with bathing/dressing/bathroom;Assistance with cooking/housework;Assistance with feeding;Direct supervision/assist for medications management;Assist for transportation;Supervision due to cognitive status;Help with stairs or ramp for entrance   Can travel by private  vehicle    no    Equipment Recommendations Other (comment) (TBD)  Recommendations for Other Services       Functional Status Assessment Patient has had a recent decline in their functional status and demonstrates the ability to make significant improvements in function in a reasonable and predictable amount of time.     Precautions / Restrictions Precautions Precautions: Fall Restrictions Weight Bearing Restrictions: No      Mobility  Bed Mobility Overal bed mobility: Needs Assistance Bed Mobility: Supine to Sit     Supine to sit: HOB elevated, Used rails, Contact guard     General bed mobility comments: increased effort and time; very distractable and requires lots of sequencing cues    Transfers Overall transfer level: Needs assistance Equipment used: Rolling walker (2 wheels) Transfers: Sit to/from Stand, Bed to chair/wheelchair/BSC Sit to Stand: Min assist, Mod assist, +2 physical assistance   Step pivot transfers: Min assist, Max assist, +2 physical assistance       General transfer comment: pt stood to urinate for ~3 min; required sequencing cues for step pivot transfer and physical assist to move RW    Ambulation/Gait               General Gait Details: deferred 2/2 fatigue  Stairs            Wheelchair Mobility     Tilt Bed    Modified Rankin (Stroke Patients Only)       Balance Overall balance assessment: Needs assistance Sitting-balance support: Feet supported, Bilateral upper extremity supported Sitting balance-[name]: Fair Sitting balance - Comments: CGA to sit EOB when performing strength assessment   Standing balance support: Bilateral upper extremity supported, Reliant on  assistive device for balance, During functional activity Standing balance-[name]: Poor Standing balance comment: heavy reliance on RW during t/f                             Pertinent Vitals/Pain Pain Assessment Pain Assessment:  No/denies pain    Home Living Family/patient expects to be discharged to:: Private residence Living Arrangements: Alone Available Help at Discharge: Family Type of Home: House Home Access: Stairs to enter   Secretary/administrator of Steps: 2   Home Layout: One level Home Equipment: Pharmacist, hospital (2 wheels)      Prior Function Prior Level of Function : Patient poor historian/Family not available             Mobility Comments: ambulates with RW ADLs Comments: Per chart review: Able to perform basic ADLs     Extremity/Trunk Assessment   Upper Extremity Assessment Upper Extremity Assessment: Defer to OT evaluation  Lower Extremity Assessment Lower Extremity Assessment: RLE deficits/detail;LLE deficits/detail RLE Deficits / Details: hip flex 4/5, knee flex/ext 4/5, DF 4-/5; no increased tone noted; intact proprioception RLE Sensation: WNL RLE Coordination: decreased gross motor LLE Deficits / Details: hip flex 3+/5, knee flex/ext 3+/5, DF 4-/5; no increased tone noted; intact proprioception LLE Sensation: WNL LLE Coordination: decreased gross motor    Cervical / Trunk Assessment Cervical / Trunk Assessment: Normal  Communication   Communication Communication: Difficulty communicating thoughts/reduced clarity of speech;Hearing impairment Following commands: Follows one step commands with increased time Cueing Techniques: Verbal cues;Tactile cues;Visual cues  Cognition Arousal: Alert Behavior During Therapy: WFL for tasks assessed/performed Overall Cognitive Status: No family/caregiver present to determine baseline cognitive functioning Area of Impairment: Orientation, Memory, Following commands, Safety/judgement, Problem solving, Awareness                 Orientation Level: Disoriented to, Place, Time, Situation   Memory: Decreased short-term memory Following Commands: Follows one step commands with increased time Safety/Judgement: Decreased  awareness of safety, Decreased awareness of deficits Awareness: Emergent Problem Solving: Slow processing, Requires verbal cues, Requires tactile cues, Decreased initiation, Difficulty sequencing          General Comments      Exercises     Assessment/Plan    PT Assessment Patient needs continued PT services  PT Problem List Decreased strength;Decreased mobility;Decreased safety awareness;Decreased range of motion;Decreased coordination;Decreased activity tolerance;Decreased cognition;Decreased balance;Decreased knowledge of use of DME       PT Treatment Interventions DME instruction;Therapeutic exercise;Gait training;Balance training;Stair training;Neuromuscular re-education;Functional mobility training;Cognitive remediation;Therapeutic activities;Patient/family education    PT Goals (Current goals can be found in the Care Plan section)  Acute Rehab PT Goals Patient Stated Goal: to feel better PT Goal Formulation: With patient Time For Goal Achievement: 03/26/23 Potential to Achieve Goals: Good    Frequency Min 1X/week     Co-evaluation               AM-PAC PT "6 Clicks" Mobility  Outcome Measure Help needed turning from your back to your side while in a flat bed without using bedrails?: A Little Help needed moving from lying on your back to sitting on the side of a flat bed without using bedrails?: A Little Help needed moving to and from a bed to a chair (including a wheelchair)?: A Lot Help needed standing up from a chair using your arms (e.g., wheelchair or bedside chair)?: A Lot Help needed to walk in hospital room?: Total Help  needed climbing 3-5 steps with a railing? : Total 6 Click Score: 12    End of Session Equipment Utilized During Treatment: Gait belt Activity Tolerance: Patient limited by fatigue Patient left: with call bell/phone within reach;in chair;with chair alarm set;Other (comment) (with speech therapist beginning session) Nurse  Communication: Mobility status PT Visit Diagnosis: Unsteadiness on feet (R26.81);Other abnormalities of gait and mobility (R26.89);Repeated falls (R29.6);Muscle weakness (generalized) (M62.81);Hemiplegia and hemiparesis Hemiplegia - Right/Left: Left Hemiplegia - caused by: Cerebral infarction    Time: 1610-9604 PT Time Calculation (min) (ACUTE ONLY): 31 min   Charges:   PT Evaluation $PT Eval Low Complexity: 1 Low PT Treatments $Therapeutic Activity: 8-22 mins PT General Charges $$ ACUTE PT VISIT: 1 Visit          Shauna Hugh, SPT 03/12/2023, 12:49 PM

## 2023-03-12 NOTE — Evaluation (Signed)
Occupational Therapy Evaluation Patient Details Name: Thomas [name] MRN: [id number] DOB: Jan 17, 1931 Today's Date: 03/12/2023   History of Present Illness Pt is an 89 yo male with incoordination, trouble getting out of bed, and weakness the morning of presentation to the ED. MRI reveals new acute CVA. Per MD note, pt had a "recent CVA 3 weeks ago treated at [facility] was brought to the ED due to generalized weakness and dizziness being off balance x 1 day. Patient came home from [facility] rehab 87 days ago after multifocal small vessel infarcts. 1 night before presentation to [facility] family states the patient did not want to eat dinner and started complaining of dizziness." Other PMH includes HTN, hyperlipidemia, GERD, and B/L carpal tunnel.   Clinical Impression   Pt was seen for OT evaluation this date. Per chart review, PTA and following [facility] admission, pt completed basic ADL, assist with IADL amb with RW at baseline. Pt is a poor historian, need to confirm PLOF and AD equipment needs. Pt alert and ?orientation throughout session, seemly anxious and very fearful of movement. Unable to follow one step directions consistently. Will continue to assess functional vision, pt unable to follow directions of visual assessment on this date. L shoulder flexion 3+/5 MMT strength compared to R 4/5. Slight delay in finger opposition on the L digits side.   Pt presents to acute OT demonstrating impaired ADL performance and functional mobility 2/2 (See OT problem list for additional functional deficits). Pt did not attempt to donn socks despite verbal cues, MAX A on this date. Pt currently requires MODA + 2 and RW for all OOB mobility. Pt t/f from recliner <> BSC with MODA + 2 and RW, requiring verbal/tactile cues to facilitate steps, hand placement and DME management. Pt reported he no longer needed to use bathroom, t/f back to recliner. Pt would benefit from skilled OT services to address noted impairments and functional limitations  (see below for any additional details) in order to maximize safety and independence while minimizing falls risk and caregiver burden. OT will follow acutely.       If plan is discharge home, recommend the following: A lot of help with walking and/or transfers;A lot of help with bathing/dressing/bathroom;Supervision due to cognitive status;Direct supervision/assist for financial management;Assistance with cooking/housework;Assist for transportation;Help with stairs or ramp for entrance;Direct supervision/assist for medications management    Functional Status Assessment  Patient has had a recent decline in their functional status and demonstrates the ability to make significant improvements in function in a reasonable and predictable amount of time.  Equipment Recommendations  Other (comment) (defer to next venue)    Recommendations for Other Services       Precautions / Restrictions Precautions Precautions: Fall Restrictions Weight Bearing Restrictions: No      Mobility Bed Mobility               General bed mobility comments: NT pt in recliner pre/post session    Transfers Overall transfer level: Needs assistance Equipment used: Rolling walker (2 wheels) Transfers: Sit to/from Stand, Bed to chair/wheelchair/BSC Sit to Stand: Mod assist, +2 physical assistance     Step pivot transfers: Mod assist, +2 physical assistance            Balance Overall balance assessment: Needs assistance Sitting-balance support: Feet supported, Single extremity supported Sitting balance-[name]: Fair     Standing balance support: Bilateral upper extremity supported, Reliant on assistive device for balance, During functional activity Standing balance-[name]: Poor Standing balance  comment: able to maintain static standing balance with BUE support on RW; heavy reliance on RW during t/f                           ADL either performed or assessed with clinical  judgement   ADL Overall ADL's : Needs assistance/impaired Eating/Feeding: Set up;Sitting Eating/Feeding Details (indicate cue type and reason): Effortful to bring food to mouth, communicated to RN about supervision during meals                 Lower Body Dressing: Maximal assistance (Sitting in recliner) Lower Body Dressing Details (indicate cue type and reason): Pt attempted to reach for feet but unable to complete on this date, states he is having trouble. Toilet Transfer: Modified Independent;+2 for physical assistance;Rolling walker (2 wheels);BSC/3in1 Toilet Transfer Details (indicate cue type and reason): step by step tactile/verbal cues, and physical assist for trunk managment + RW  control         Functional mobility during ADLs: Moderate assistance;+2 for physical assistance General ADL Comments: Pt stated he needed to have BM, but once t/f to [facility] pt stated he no longer needed to go. Pt transfer back to recliner set up for breakfast.     Vision Baseline Vision/History: 1 Wears glasses Vision Assessment?: Yes Ocular Range of Motion: Impaired-to be further tested in functional context Alignment/Gaze Preference: Within Defined Limits Tracking/Visual Pursuits: Requires cues, head turns, or add eye shifts to track Saccades: Additional head turns occurred during testing Convergence: Within functional limits Visual Fields: No apparent deficits Additional Comments: Will continue to assess functionally, pt difficult to understand cues, continued to turn head throughout visual assessment.     Perception Perception: Within Functional Limits       Praxis Praxis: Impaired Praxis Impairment Details: Initiation, Motor planning, Organization     Pertinent Vitals/Pain Pain Assessment Pain Assessment: No/denies pain     Extremity/Trunk Assessment Upper Extremity Assessment Upper Extremity Assessment: Defer to OT evaluation LUE Deficits / Details: Decreased strength in L  shoulder flexion; 3+/5 MMT LUE Sensation:  (Will continue to assess, pt not understanding cues on this date) LUE Coordination: decreased gross motor;decreased fine motor Lower extremity assessment: generalized weak       Cervical / Trunk Assessment Cervical / Trunk Assessment: Normal   Communication Communication Communication: Difficulty communicating thoughts/reduced clarity of speech;Hearing impairment Following commands: Follows one step commands with increased time Cueing Techniques: Verbal cues;Tactile cues;Visual cues   Cognition Arousal: Alert Behavior During Therapy: WFL for tasks assessed/performed (anxious at time, able to consoule with conversation) Overall Cognitive Status: Impaired/Different from baseline Area of Impairment: Orientation, Attention, Memory, Following commands, Safety/judgement, Awareness, Problem solving                 Orientation Level: Disoriented to, Time Current Attention Level: Sustained Memory: Decreased short-term memory Following Commands: Follows one step commands with increased time Safety/Judgement: Decreased awareness of safety, Decreased awareness of deficits Awareness: Emergent Problem Solving: Slow processing, Requires verbal cues, Requires tactile cues, Decreased initiation, Difficulty sequencing       General Comments       Exercises Other Exercises Other Exercises: Edu: Safe ADL completion, step-by-step t/f sequencing with RW   Shoulder Instructions      Home Living Family/patient expects to be discharged to:: Private residence Living Arrangements: Alone Available Help at Discharge: Family Type of Home: House Home Access: Stairs to enter [employer] of Steps: 2   Home  Layout: One level     Bathroom Shower/Tub: Chief Strategy Officer: Standard     Home Equipment: Pharmacist, hospital (2 wheels)      Lives With: Alone    Prior Functioning/Environment Prior Level of Function :  Patient poor historian/Family not available             Mobility Comments: ambulates with RW ADLs Comments: Per chart review: Able to preform basic ADLs        OT Problem List: Decreased strength;Decreased safety awareness;Decreased activity tolerance;Impaired balance (sitting and/or standing);Decreased knowledge of use of DME or AE;Decreased cognition;Decreased coordination      OT Treatment/Interventions: Self-care/ADL training;Therapeutic exercise;Neuromuscular education;DME and/or AE instruction;Manual therapy;Balance training;Patient/family education;Energy conservation    OT Goals(Current goals can be found in the care plan section) Acute Rehab OT Goals Patient Stated Goal: To get better OT Goal Formulation: With patient Time For Goal Achievement: 03/26/23 Potential to Achieve Goals: Good ADL Goals Pt Will Perform Grooming: standing;with min assist Pt Will Perform Lower Body Dressing: sit to/from stand;with min assist Pt Will Transfer to Toilet: with min assist;bedside commode;ambulating Pt Will Perform Toileting - Clothing Manipulation and hygiene: with min assist;sit to/from stand  OT Frequency: Min 1X/week    Co-evaluation              AM-PAC OT "6 Clicks" Daily Activity     Outcome Measure Help from another person eating meals?: A Little Help from another person taking care of personal grooming?: A Lot Help from another person toileting, which includes using toliet, bedpan, or urinal?: A Lot Help from another person bathing (including washing, rinsing, drying)?: A Lot Help from another person to put on and taking off regular upper body clothing?: A Lot Help from another person to put on and taking off regular lower body clothing?: A Lot 6 Click Score: 13   End of Session Equipment Utilized During Treatment: Rolling walker (2 wheels) Nurse Communication: Mobility status;Other (comment) (The possible need for supervision during self feeding with pt during first  few meals)  Activity Tolerance: Patient tolerated treatment well Patient left: in chair;with call bell/phone within reach;with chair alarm set  OT Visit Diagnosis: Unsteadiness on feet (R26.81);Other abnormalities of gait and mobility (R26.89);Repeated falls (R29.6);Muscle weakness (generalized) (M62.81);History of falling (Z91.81)                Time: 4166-0630 OT Time Calculation (min): 28 min Charges:  OT General Charges $OT Visit: 1 Visit OT Evaluation $OT Eval Moderate Complexity: 1 Mod  [employer], OTS

## 2023-03-12 NOTE — Progress Notes (Signed)
Pt had a comfortable night. Continues to be NPO till cleared by ST. Unable to complete admission because pt was too sleepy to answer the questions.

## 2023-03-12 NOTE — Consult Note (Signed)
NEUROLOGY CONSULT NOTE   Date of service: March 12, 2023 Patient Name: Thomas [name] MRN:  [id number] DOB:  25-Jul-1930 Chief Complaint: "Weakness" Requesting Provider: Tresa Moore, MD  History of Present Illness  Thomas [name] is a 89 y.o. male  has a past medical history of Arthritis, Carpal tunnel syndrome on both sides, GERD (gastroesophageal reflux disease), Hyperlipidemia, and Hypertension.-Recent multifocal infarctions in early October, brought into the ED for weakness dizziness and being off balance for 1 day.  He came home from [facility] rehab about 7 days ago and was presumably doing well and ambulating on his own was able to perform basic ADLs but at night before the presentation, family reports the patient had decreased appetite, worsening coordination and difficulty with gait and getting out of bed.  MRI was performed in the emergency department at [facility] and showed a new acute infarct in the right thalamus.  Prior imaging from October for in PACS showed multiple subcortical infarcts-thought to be concomitant small vessel disease.  Was discharged home on DAPT for 3 months along with high intensity statin. CT angiography of the head and neck showed moderate proximal P2 segment stenosis bilaterally along with tandem more high-grade P2 segment stenosis more proximal on the left.  High-grade stenosis of the inferior left P3 segment.  Atherosclerotic changes at the carotid bifurcations bilaterally and cavernous internal carotid arteries bilaterally.  Possible schwannoma in the neck.  Multilevel cervical spine DJD.   LKW: Unclear-some change in his gait and balance about 2 days ago Modified rankin score: 3-Moderate disability-requires help but walks WITHOUT assistance IV Thrombolysis: Outside the window, recent strokes EVT: Outside the window, recent strokes   ROS  Comprehensive ROS performed and pertinent positives documented in HPI  Past History   Past Medical History:   Diagnosis Date   Arthritis    Carpal tunnel syndrome on both sides    GERD (gastroesophageal reflux disease)    Hyperlipidemia    Hypertension     Past Surgical History:  Procedure Laterality Date   INGUINAL HERNIA REPAIR  2001   REPLACEMENT TOTAL KNEE Bilateral     Family History: Family History  Problem Relation Age of Onset   Cancer Sister        Liver cancer   Melanoma Sister    Cancer Brother        Pancreatic   Cancer Brother        Lung cancer   Heart disease Brother     Social History  reports that he has never smoked. He has never used smokeless tobacco. He reports that he does not drink alcohol and does not use drugs.  No Known Allergies  Medications   Current Facility-Administered Medications:    acetaminophen (TYLENOL) tablet 650 mg, 650 mg, Oral, Q4H PRN **OR** acetaminophen (TYLENOL) 160 MG/5ML solution 650 mg, 650 mg, Per Tube, Q4H PRN **OR** acetaminophen (TYLENOL) suppository 650 mg, 650 mg, Rectal, Q4H PRN, Sreenath, Sudheer B, MD   aspirin EC tablet 81 mg, 81 mg, Oral, Daily, Sreenath, Sudheer B, MD   bisacodyl (DULCOLAX) EC tablet 10 mg, 10 mg, Oral, QHS, Sreenath, Sudheer B, MD   bisacodyl (DULCOLAX) suppository 10 mg, 10 mg, Rectal, Daily PRN, Sreenath, Sudheer B, MD   clopidogrel (PLAVIX) tablet 75 mg, 75 mg, Oral, Daily, Sreenath, Sudheer B, MD   enoxaparin (LOVENOX) injection 40 mg, 40 mg, Subcutaneous, Q24H, Sreenath, Sudheer B, MD, 40 mg at 03/11/23 1821   hydrALAZINE (APRESOLINE) injection 10 mg, 10  mg, Intravenous, Q6H PRN, Sreenath, Sudheer B, MD   pantoprazole (PROTONIX) EC tablet 40 mg, 40 mg, Oral, Daily, Sreenath, Sudheer B, MD   polyethylene glycol (MIRALAX / GLYCOLAX) packet 17 g, 17 g, Oral, Daily, Sreenath, Sudheer B, MD   pravastatin (PRAVACHOL) tablet 10 mg, 10 mg, Oral, q1800, Sreenath, Sudheer B, MD   senna (SENOKOT) tablet 8.6 mg, 1 tablet, Oral, QHS PRN, Lolita Patella B, MD  Vitals   Vitals:   03/11/23 2120 03/12/23  0009 03/12/23 0506 03/12/23 0740  BP: (!) 152/78 (!) 143/79 133/70 (!) 168/96  Pulse: 62 (!) 56 (!) 54 61  Resp: 20 16 16 17   Temp: 98 F (36.7 C) 97.9 F (36.6 C) (!) 97.4 F (36.3 C) 97.7 F (36.5 C)  TempSrc:      SpO2: 96% 91% 95% 97%  Height:        Body mass index is 24.94 kg/m.  Physical Exam  General: Awake alert pleasantly confused HEENT: Normocephalic/atraumatic Lungs: Clear Cardiovascular: Regular rhythm Neurological exam Awake alert oriented to self Unable to tell me the exact name of the hospital where he is or his correct age but did tell me that his birthday is [birth year]. Upon asking of the month and also telling him what month it is, on repeated questioning he still got the month wrong. Diminished attention concentration Naming comprehension and repetition are intact Mild dysarthria Cranial nerves II to XII-pupils equal round react light, extraocular movements intact, visual fields full, facial sensation intact, question some subtle left nasolabial fold flattening but he is able to smile normally, tongue and palate midline. Motor examination with no drift in any of the 4 extremities Sensation intact light touch Coordination: As gait ataxia and is ataxic on heel-knee-shin bilaterally. NIH stroke scale 1a Level of Conscious.: 0 1b LOC Questions: 2 1c LOC Commands: 0 2 Best Gaze: 0 3 Visual: 0 4 Facial Palsy: 1 5a Motor Arm - left: 0 5b Motor Arm - Right: 0 6a Motor Leg - Left: 0 6b Motor Leg - Right: 0 7 Limb Ataxia: 2 8 Sensory: 0 9 Best Language: 0 10 Dysarthria: 1 11 Extinct. and Inatten.: 0 TOTAL: 6   Labs   CBC:  Recent Labs  Lab 03/11/23 1202  WBC 5.8  NEUTROABS 3.8  HGB 14.2  HCT 43.2  MCV 92.3  PLT 206    Basic Metabolic Panel:  Lab Results  Component Value Date   NA 140 03/11/2023   K 4.0 03/11/2023   CO2 27 03/11/2023   GLUCOSE 94 03/11/2023   BUN 12 03/11/2023   CREATININE 0.94 03/11/2023   CALCIUM 9.2 03/11/2023    GFRNONAA >60 03/11/2023   GFRAA >60 10/28/2011   Lipid Panel:  Lab Results  Component Value Date   LDLCALC 87 03/12/2023   HgbA1c:  Lab Results  Component Value Date   HGBA1C 5.4 02/12/2023   Urine Drug Screen:     Component Value Date/Time   LABOPIA NONE DETECTED 03/11/2023 1202   COCAINSCRNUR NONE DETECTED 03/11/2023 1202   LABBENZ NONE DETECTED 03/11/2023 1202   AMPHETMU NONE DETECTED 03/11/2023 1202   THCU NONE DETECTED 03/11/2023 1202   LABBARB NONE DETECTED 03/11/2023 1202    Alcohol Level     Component Value Date/Time   ETH <10 03/11/2023 1359   INR  Lab Results  Component Value Date   INR 1.1 03/11/2023   APTT  Lab Results  Component Value Date   APTT 32 03/11/2023  CT Head without contrast(Personally reviewed): No acute findings.  MRI Brain(Personally reviewed): Acute right thalamic infarct.  No hemorrhage.  CT angio head and neck done during last admission-on my review has about 50% stenosis of the right ICA.  Multifocal stenosis and bilateral posterior circulation with fetal PCAs.  Atherosclerotic changes at the carotid bifurcations as well as cavernous ICAs-official read without focal stenosis but right ICA looks about 50% stenosed.  2D echo-LVEF 60 to 65%.  Mild concentric LVH with grade 1 diastolic dysfunction mitral valve normal.  Trivial mitral regurgitation.  Aortic valve tricuspid-mild calcification.  Negative agitated saline contrast study.  Left atrium size normal.  Impression   EDDIE PAYETTE is a 89 y.o. male past medical history of arthritis, hypertension hyperlipidemia, and recent multifocal infarcts that involved multiple subcortical territories and workup revealed significant posterior circulation atherosclerosis with the etiology of the stroke deemed to be concomitant small vessel disease versus cardioembolic source, who is back home after rehabilitation and started having generalized weakness and balance issues.  Further evaluation with  MRI revealed a new acute right thalamic infarct. CT angiography during last admission reveals significant intracranial atherosclerosis, also involving the posterior circulation but with the interesting caveat that his posterior circulation generally is driving most of his supply from the anterior circulation due to the fetal PCAs. What this could imply is that his right carotid stenosis is actually contributing to his strokes which are all in the right hemisphere. This may need looking into by vascular surgery.  Recommendations  Continue dual antiplatelets for now Continue home statin with a goal LDL less than 70 A1c is at goal Continue therapy assessments Repeat CT angiography head and neck Once CT angiogram is completed, we will consult vascular surgery for consideration for evaluation and possible revascularization of the right carotid Avoid hypotension.  Goal at discharge will be normotension. No need to repeat A1c, 2D echo or lipid panel. Plan discussed with Dr. Georgeann Oppenheim  -- Milon Dikes, MD Neurologist [facility]

## 2023-03-12 NOTE — Evaluation (Signed)
Clinical/Bedside Swallow Evaluation Patient Details  Name: Thomas [name] MRN: [id number] Date of Birth: 04-30-1931  Today's Date: 03/12/2023 Time: SLP Start Time (ACUTE ONLY): 0905 SLP Stop Time (ACUTE ONLY): 1005 SLP Time Calculation (min) (ACUTE ONLY): 60 min  Past Medical History:  Past Medical History:  Diagnosis Date   Arthritis    Carpal tunnel syndrome on both sides    GERD (gastroesophageal reflux disease)    Hyperlipidemia    Hypertension    Past Surgical History:  Past Surgical History:  Procedure Laterality Date   INGUINAL HERNIA REPAIR  2001   REPLACEMENT TOTAL KNEE Bilateral    HPI:  Pt  is a 89 y.o. male with medical history significant of hypertension, baselien chronic Dementia per chart notes, hyperlipidemia, GERD, recent CVA 3 weeks ago treated at [facility] was brought to the ED due to generalized weakness and dizziness being off balance x 1 day.  Patient came home from [facility] rehab 7 days ago after multifocal small vessel infarcts.  Was doing well at home.  Ambulating on his own with good functional status, able to perform basic ADLs.  The night before presentation to [facility] family states the patient did not want to eat dinner and started complaining of dizziness.  The morning of presentation patient was having trouble with coordination unable to raise himself out of bed.   MRI: Acute infarct of the right anterior thalamus. No acute hemorrhage  or significant mass effect.  2. Background of severe chronic small-vessel disease with old  infarcts in the bilateral thalami and basal ganglia, as well as the  bilateral cerebellar hemispheres. Noted similar last Imaging 02/2023.  No CXR.    Assessment / Plan / Recommendation  Clinical Impression   Pt seen for BSE. Pt awake, alert. Talkative and could ID his aspiration precautions. Noted slow UEs during self-feeding. Min slower talking and processing -- noted previous cognitive-linguistic assessment last admit 02/2023.  On RA, afebrile. WBC  WNL.  Pt appears to present w/ functional oropharyngeal phase swallowing w/ No oropharyngeal phase dysphagia noted, No neuromuscular deficits noted when following general aspiration precautions as he knows to do. Pt consumed po trials w/ No overt, clinical s/s of aspiration during po's.  Pt appears at reduced risk for aspiration following general aspiration precautions and monitoring foods in his diet -- use of easy to eat foods. However, pt does have challenging factors that could impact his oropharyngeal swallowing to include deconditioning/weakness, prior CVA, Baseline Cognitive decline per chart, and advanced age as well as current slowness in UEs hampering his self-feeding abilities. These factors can increase risk for aspiration, dysphagia as well as decreased oral intake overall.   During po trials, pt consumed all consistencies w/ no overt coughing, decline in vocal quality, or change in respiratory presentation during/post trials. O2 sats 98% when checked. Oral phase appeared grossly [facility] w/ timely bolus management, mastication, and control of bolus propulsion for A-P transfer for swallowing. Oral clearing achieved w/ all trial consistencies -- moistened, soft foods and Time given w/ the increased textured trials d/t no lower Dentition plate.  OM Exam appeared grossly [facility] w/ no lingual weakness and only slight left unilateral facial/labial weakness noted. No bolus loss. Speech Clear. Pt fed self w/ setup support.   Recommend a more Mech Soft diet consistency w/ well-Cut meats, moistened foods easy to chew; Thin liquids -- carefully monitor and pt should Hold Cup when drinking. Use of the Dysphagia drink cup recommended. Dtr updated on this and agreed.  Recommend general aspiration precautions, sitting fully upright w/ all oral intake -- NO STRAWS. Reduce distractions during meals. Small bites/sips slowly. Pills WHOLE vs Crushed in Puree for safer, easier swallowing -- pt has done this in the past, and  it was encourged now and for D/C again to the Dtr.  Education given on Pills in Puree; food consistencies and easy to eat options; general aspiration precautions; Dysphagia drink Cup to pt and Dtr. No further skilled ST services indicated as pt appears at/close to his baseline when following his precautions. MD/NSG to reconsult if any new needs arise during admit. NSG updated, agreed. MD updated. Recommend Dietician f/u for support. SLP Visit Diagnosis: Dysphagia, unspecified (R13.10) (no lower Dentition; slow UEs; Cognitive decline baseline)    Aspiration Risk  Mild aspiration risk;Risk for inadequate nutrition/hydration (reduced following general aspiration precautions)    Diet Recommendation   Thin;Dysphagia 3 (mechanical soft) (gravies) = a more Mech Soft diet consistency w/ well-Cut meats, moistened foods easy to chew; Thin liquids -- carefully monitor and pt should Hold Cup when drinking. Use of the Dysphagia drink cup recommended. Dtr updated on this and agreed. Recommend general aspiration precautions, sitting fully upright w/ all oral intake -- NO STRAWS. Reduce distractions during meals. Small bites/sips slowly.   Medication Administration: Whole meds with puree (vs Crushed in puree)    Other  Recommendations Recommended Consults:  (Dietician f/u; Palliative Care f/u) Oral Care Recommendations: Oral care BID;Oral care before and after PO;Staff/trained caregiver to provide oral care (denture care)    Recommendations for follow up therapy are one component of a multi-disciplinary discharge planning process, led by the attending physician.  Recommendations may be updated based on patient status, additional functional criteria and insurance authorization.  Follow up Recommendations Follow physician's recommendations for discharge plan and follow up therapies      Assistance Recommended at Discharge  Intermittent  Functional Status Assessment Patient has had a recent decline in their  functional status and demonstrates the ability to make significant improvements in function in a reasonable and predictable amount of time.  Frequency and Duration  (n/a)   (n/a)       Prognosis Prognosis for improved oropharyngeal function: Fair (-Good) Barriers to Reach Goals: Cognitive deficits;Language deficits;Time post onset;Severity of deficits Barriers/Prognosis Comment: baseline Cognitive decline per chart; missing lower Dentition; slow to self-feed d/t UEs      Swallow Study   General Date of Onset: 03/11/23 HPI: Pt  is a 89 y.o. male with medical history significant of hypertension, baselien chronic Dementia per chart notes, hyperlipidemia, GERD, recent CVA 3 weeks ago treated at [facility] was brought to the ED due to generalized weakness and dizziness being off balance x 1 day.  Patient came home from [facility] rehab 7 days ago after multifocal small vessel infarcts.  Was doing well at home.  Ambulating on his own with good functional status, able to perform basic ADLs.  The night before presentation to [facility] family states the patient did not want to eat dinner and started complaining of dizziness.  The morning of presentation patient was having trouble with coordination unable to raise himself out of bed.   MRI: Acute infarct of the right anterior thalamus. No acute hemorrhage  or significant mass effect.  2. Background of severe chronic small-vessel disease with old  infarcts in the bilateral thalami and basal ganglia, as well as the  bilateral cerebellar hemispheres. Noted similar last Imaging 02/2023.  No CXR. Type of Study: Bedside Swallow Evaluation  Previous Swallow Assessment: none Diet Prior to this Study: Regular;Thin liquids (Level 0) Temperature Spikes Noted: No (wbc 5.8) Respiratory Status: Room air History of Recent Intubation: No Behavior/Cognition: Alert;Cooperative;Pleasant mood;Distractible;Requires cueing (baseline deficits per chart) Oral Cavity Assessment: Within Functional  Limits Oral Care Completed by SLP: Recent completion by staff Oral Cavity - Dentition: Dentures, top (not wearing his lower dentures) Vision: Functional for self-feeding Self-Feeding Abilities: Able to feed self;Needs assist;Needs set up (slow UEs) Patient Positioning: Upright in chair Baseline Vocal Quality: Normal Volitional Cough: Strong Volitional Swallow: Able to elicit    Oral/Motor/Sensory Function Overall Oral Motor/Sensory Function: Mild impairment (slight) Facial ROM: Reduced left;Suspected CN VII (facial) dysfunction (slight) Facial Symmetry: Abnormal symmetry left;Suspected CN VII (facial) dysfunction (slight) Facial Strength: Reduced left;Suspected CN VII (facial) dysfunction (slight) Lingual ROM: Within Functional Limits Lingual Symmetry: Within Functional Limits Lingual Strength: Within Functional Limits Lingual Sensation: Within Functional Limits Velum: Within Functional Limits Mandible: Within Functional Limits   Ice Chips Ice chips: Within functional limits Presentation: Spoon (fed; 2 trials)   Thin Liquid Thin Liquid: Within functional limits Presentation: Cup;Self Fed (10 trials) Other Comments: single sips slowly    Nectar Thick Nectar Thick Liquid: Not tested   Honey Thick Honey Thick Liquid: Not tested   Puree Puree: Within functional limits Presentation: Self Fed;Spoon (~3 ozs) Other Comments: applesauce, pudding   Solid     Solid: Within functional limits (grossly) Presentation: Self Fed (5 trials) Other Comments: missing lower dentition - extra time        Jerilynn Som, MS, CCC-SLP Speech Language Pathologist Rehab Services; [facility] [place] [phone] (ascom) Joevon Holliman 03/12/2023,11:46 AM

## 2023-03-13 DIAGNOSIS — I639 Cerebral infarction, unspecified: Secondary | ICD-10-CM | POA: Diagnosis not present

## 2023-03-13 MED ORDER — MELATONIN 5 MG PO TABS
2.5000 mg | ORAL_TABLET | Freq: Every day | ORAL | Status: DC
  Administered 2023-03-13 – 2023-03-18 (×5): 2.5 mg via ORAL
  Filled 2023-03-13 (×6): qty 1

## 2023-03-13 NOTE — Progress Notes (Signed)
PROGRESS NOTE    Thomas [name]  ZOX:[id number] DOB: Jan 06, 1931 DOA: 03/11/2023 PCP: Barbette Reichmann, MD    Brief Narrative:   89 y.o. male with medical history significant of hypertension, hyperlipidemia, GERD, recent CVA 3 weeks ago treated at [facility] was brought to the ED due to generalized weakness and dizziness being off balance x 1 day.  Patient came home from [facility] rehab 7 days ago after multifocal small vessel infarcts.  Was doing well at home.  Ambulating on his own with good functional status, able to perform basic ADLs.   1 night before presentation to [facility] family states the patient did not want to eat dinner and started complaining of dizziness.  The morning of presentation patient was having trouble with coordination unable to raise himself out of bed.  No evidence of trauma.   On presentation to the ED the patient is hemodynamically stable.  MRI reveals new acute CVA.  On my interview the patient is resting comfortably in bed.  Son-in-law at bedside.  Primary caregiver.  States that the patient was discharged on dual antiplatelet therapy aspirin and Plavix and has been compliant with all medications.  11/1: Patient seen in consultation by neurology who ordered CTA to evaluate carotid anatomy.  Pending CTA results and possible vascular surgery consult.  Pending PT and OT evaluations.  11/2: Case discussed at length with neurology.  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  Vascular surgery engaged for comment consideration of carotid endarterectomy    Assessment & Plan:   Principal Problem:   Acute CVA (cerebrovascular accident) (HCC)  Acute CVA Patient presents 3 weeks after previous hospitalization and discharged from [facility].  Now with evidence of new acute CVA on imaging.  Patient son-in-law at bedside reports compliance with dual antiplatelet therapy and antihyperlipidemic therapy at  home. Seen in consultation by neurology Concerned that recurrent strokes within the same vascular territory are secondary to calcium emboli Plan: Continue DAPT Continue statin, lipid profile at goal PT and OT evaluations Diet advanced per SLP recommendations Continue cardiac monitoring Vascular consult.  Dr. Lenell Antu engaged.  Consideration for carotid endarterectomy TOC consult for placement.  Will need skilled nursing facility    Essential hypertension Blood pressure controlled Normotensive blood pressure goals   Hyperlipidemia PTA statin.  Lipid profile at goal   GERD PPI    DVT prophylaxis: SQ Lovenox Code Status: DNR Family Communication:  Disposition Plan: Status is: Observation The patient will require care spanning > 2 midnights and should be moved to inpatient because: Acute CVA.  Concern for calcium emboli within the PCA distribution.  Vascular surgery engaged.  Pending official consult and recommendations.   Level of care: Telemetry Medical  Consultants:  Neurology  Procedures:  None  Antimicrobials: None   Subjective: Seen and examined.  Sitting up in chair.  No visible distress.  Pleasant.  No pain complaints  Objective: Vitals:   03/12/23 2121 03/13/23 0006 03/13/23 0400 03/13/23 0857  BP: 135/76 116/72 (!) 160/62 (!) 124/90  Pulse: 75 68 67 (!) 59  Resp: 16 18  19   Temp: 98.4 F (36.9 C) (!) 97.5 F (36.4 C) 98.2 F (36.8 C) 97.9 F (36.6 C)  TempSrc: Oral  Axillary Oral  SpO2: 97% 95% 100% 95%  Height:        Intake/Output Summary (Last 24 hours) at 03/13/2023 1125 Last data filed at 03/13/2023 1105 Gross per 24 hour  Intake 220 ml  Output 1200 ml  Net -980 ml   There were no vitals filed for this visit.  Examination:  General exam: No acute distress Respiratory system: Lungs clear.  Normal work of breathing.  Room air Cardiovascular system: S1-2, RRR, no murmurs, no pedal edema Gastrointestinal system: Soft, NT/ND, normal bowel  sounds Central nervous system: Alert.  Oriented x 3.  Upper extremity weakness Extremities: Symmetric 5 x 5 power.  Gait not assessed Skin: No rashes, lesions or ulcers Psychiatry: Judgement and insight appear normal. Mood & affect appropriate.     Data Reviewed: I have personally reviewed following labs and imaging studies  CBC: Recent Labs  Lab 03/11/23 1202  WBC 5.8  NEUTROABS 3.8  HGB 14.2  HCT 43.2  MCV 92.3  PLT 206   Basic Metabolic Panel: Recent Labs  Lab 03/11/23 1202  NA 140  K 4.0  CL 104  CO2 27  GLUCOSE 94  BUN 12  CREATININE 0.94  CALCIUM 9.2   GFR: CrCl cannot be calculated (Unknown ideal weight.). Liver Function Tests: Recent Labs  Lab 03/11/23 1202  AST 21  ALT 18  ALKPHOS 98  BILITOT 0.7  PROT 6.8  ALBUMIN 3.7   No results for input(s): "LIPASE", "AMYLASE" in the last 168 hours. No results for input(s): "AMMONIA" in the last 168 hours. Coagulation Profile: Recent Labs  Lab 03/11/23 1359  INR 1.1   Cardiac Enzymes: No results for input(s): "CKTOTAL", "CKMB", "CKMBINDEX", "TROPONINI" in the last 168 hours. BNP (last 3 results) No results for input(s): "PROBNP" in the last 8760 hours. HbA1C: No results for input(s): "HGBA1C" in the last 72 hours. CBG: Recent Labs  Lab 03/11/23 1149  GLUCAP 103*   Lipid Profile: Recent Labs    03/12/23 0500  CHOL 147  HDL 28*  LDLCALC 87  TRIG 244*  CHOLHDL 5.3   Thyroid Function Tests: No results for input(s): "TSH", "T4TOTAL", "FREET4", "T3FREE", "THYROIDAB" in the last 72 hours. Anemia Panel: No results for input(s): "VITAMINB12", "FOLATE", "FERRITIN", "TIBC", "IRON", "RETICCTPCT" in the last 72 hours. Sepsis Labs: No results for input(s): "PROCALCITON", "LATICACIDVEN" in the last 168 hours.  Recent Results (from the past 240 hour(s))  SARS Coronavirus 2 by RT PCR (hospital order, performed in [facility] hospital lab) *cepheid single result test* Anterior Nasal Swab     Status:  None   Collection Time: 03/11/23  1:59 PM   Specimen: Anterior Nasal Swab  Result Value Ref Range Status   SARS Coronavirus 2 by RT PCR NEGATIVE NEGATIVE Final    Comment: (NOTE) SARS-CoV-2 target nucleic acids are NOT DETECTED.  The SARS-CoV-2 RNA is generally detectable in upper and lower respiratory specimens during the acute phase of infection. The lowest concentration of SARS-CoV-2 viral copies this assay can detect is 250 copies / mL. A negative result does not preclude SARS-CoV-2 infection and should not be used as the sole basis for treatment or other patient management decisions.  A negative result may occur with improper specimen collection / handling, submission of specimen other than nasopharyngeal swab, presence of viral mutation(s) within the areas targeted by this assay, and inadequate number of viral copies (<250 copies / mL). A negative result must be combined with clinical observations, patient history, and epidemiological information.  Fact Sheet for Patients:   RoadLapTop.co.za  Fact Sheet for Healthcare Providers: http://kim-miller.com/  This test is not yet approved or  cleared by the [place] FDA and has been authorized for detection and/or  diagnosis of SARS-CoV-2 by FDA under an Emergency Use Authorization (EUA).  This EUA will remain in effect (meaning this test can be used) for the duration of the COVID-19 declaration under Section 564(b)(1) of the Act, 21 U.S.C. section 360bbb-3(b)(1), unless the authorization is terminated or revoked sooner.  Performed at [facility], [address], [place], [place] [address]          Radiology Studies: CT ANGIO HEAD NECK W WO CM  Result Date: 03/12/2023 CLINICAL DATA:  Stroke/TIA, determine embolic source. EXAM: CT ANGIOGRAPHY HEAD AND NECK WITH AND WITHOUT CONTRAST TECHNIQUE: Multidetector CT imaging of the head and neck was performed using the  standard protocol during bolus administration of intravenous contrast. Multiplanar CT image reconstructions and MIPs were obtained to evaluate the vascular anatomy. Carotid stenosis measurements (when applicable) are obtained utilizing NASCET criteria, using the distal internal carotid diameter as the denominator. RADIATION DOSE REDUCTION: This exam was performed according to the departmental dose-optimization program which includes automated exposure control, adjustment of the mA and/or kV according to patient size and/or use of iterative reconstruction technique. CONTRAST:  75mL OMNIPAQUE IOHEXOL 350 MG/ML SOLN COMPARISON:  Brain MRI 1 day prior, CTA head/neck 02/12/2023 FINDINGS: CT HEAD FINDINGS Brain: The known infarct in the right thalamus is again seen, better seen on prior MRI. There is no evidence of new acute infarct. There is no acute intracranial hemorrhage or extra-axial fluid collection. Parenchymal volume loss with prominence of the ventricular system and extra-axial CSF spaces is stable. The ventricles are stable in size. Advanced background chronic small-vessel ischemic change is stable. Multiple small remote infarcts are again noted in the bilateral basal ganglia, thalami, and cerebellar hemispheres. The pituitary and suprasellar region are normal. There is no mass lesion. There is no mass effect or midline shift. Vascular: See below. Skull: Normal. Negative for fracture or focal lesion. Sinuses/Orbits: The paranasal sinuses are clear. Bilateral lens implants are in place. The globes and orbits are otherwise unremarkable. Other: The mastoid air cells and middle ear cavities are clear. Review of the MIP images confirms the above findings CTA NECK FINDINGS Aortic arch: There is calcified plaque in the imaged aortic arch. The origins of the major branch vessels are patent. The subclavian arteries are patent to the level imaged. Right carotid system: The right common carotid artery is patent. There is  mixed plaque at the bifurcation resulting in approximately 50% stenosis of the proximal internal carotid artery, not significantly changed. The distal internal carotid artery is widely patent. The external carotid artery is patent. There is no evidence of dissection or aneurysm. Left carotid system: The left common carotid artery is patent. There is calcified plaque at the bifurcation suspected to result in approximately 50% stenosis of the proximal internal carotid artery, though measurement is made difficult by significant blooming artifact from the calcium. Findings are unchanged. The external carotid artery is patent. There is no evidence of dissection or aneurysm. Vertebral arteries: The vertebral arteries are patent, without hemodynamically significant stenosis or occlusion. There is no evidence of dissection or aneurysm. Skeleton: There is no acute osseous abnormality or suspicious osseous lesion. There is advanced multilevel degenerative change throughout the cervical spine. There is no visible canal hematoma or high-grade spinal canal stenosis. Other neck: A 3.7 cm x 4.8 cm x 4.2 cm pathologic lymph node in the right neck is not significantly changed, present since 2019. Upper chest: The imaged lung apices are clear. Review of the MIP images confirms the above findings  CTA HEAD FINDINGS Anterior circulation: There is calcified plaque in the intracranial ICAs without significant stenosis or occlusion The bilateral MCAs are patent, without proximal stenosis or occlusion. The left A1 segment is hypoplastic, a normal variant. The ACAS are otherwise patent, without proximal stenosis or occlusion. 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 Posterior circulation: The bilateral V4 segments are patent with mild stenosis on the left. The basilar artery is patent but diminutive distally. The major cerebellar arteries  appear patent. The PCAs are primarily fed by posterior communicating arteries bilaterally (fetal origins). There is new focal calcification in the proximal right P2 segment resulting in severe stenosis (11-99, 10-124). There is focal severe stenosis of the right P2/P3 segment (10-110), unchanged. There is multifocal severe stenosis of the left PCA at the proximal P2 segment (11-93), more distal P2 segment (11-102), and P3 segment (11-140). There is no aneurysm or AVM. Venous sinuses: Not well evaluated due to bolus timing. Anatomic variants: As above. Review of the MIP images confirms the above findings IMPRESSION: 1. The known right thalamic infarct is again seen, better evaluated on MRI from 1 day prior. No new acute intracranial pathology. 2. New focal calcification in the proximal right P2 segment resulting in severe stenosis. Additional multifocal severe stenosis of both PCAs is unchanged. 3. Mixed plaque resulting in approximately 50% stenosis of the proximal right ICA in the neck, unchanged. 4. Calcified plaque at the left carotid bifurcation is also suspected to resulting in approximately 50% stenosis, though evaluation is made difficult by blooming artifact from the calcium. 5. 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 6. Unchanged right neck mass. Electronically Signed   By: Lesia Hausen M.D.   On: 03/12/2023 12:51   MR BRAIN WO CONTRAST  Result Date: 03/11/2023 CLINICAL DATA:  Transient ischemic attack (TIA). EXAM: MRI HEAD WITHOUT CONTRAST TECHNIQUE: Multiplanar, multiecho pulse sequences of the brain and surrounding structures were obtained without intravenous contrast. COMPARISON:  MRI brain 02/12/2023. FINDINGS: Brain: Acute infarct of the right anterior thalamus. No acute hemorrhage or significant mass effect. 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 No  hydrocephalus or extra-axial collection. Vascular: Normal flow voids. Skull and upper cervical spine: Normal marrow signal. Sinuses/Orbits: No acute findings. Other: None. IMPRESSION: 1. Acute infarct of the right anterior thalamus. No acute hemorrhage or significant mass effect. 2. 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 Electronically Signed   By: Orvan Falconer M.D.   On: 03/11/2023 16:21   CT HEAD WO CONTRAST  Result Date: 03/11/2023 CLINICAL DATA:  Neuro deficit with acute stroke suspected EXAM: CT HEAD WITHOUT CONTRAST TECHNIQUE: Contiguous axial images were obtained from the base of the skull through the vertex without intravenous contrast. RADIATION DOSE REDUCTION: This exam was performed according to the departmental dose-optimization program which includes automated exposure control, adjustment of the mA and/or kV according to patient size and/or use of iterative reconstruction technique. COMPARISON:  Brain MRI 02/12/2023 FINDINGS: Brain: No evidence of acute infarction, hemorrhage, hydrocephalus, extra-axial collection or mass lesion/mass effect. Chronic small vessel ischemia in the cerebral white matter with chronic perforator infarcts in the deep gray nuclei and chronic. Small vessel infarcts in the  bilateral cerebellum. There is brain atrophy that is pronounced with ventriculomegaly. Vascular: No hyperdense vessel or unexpected calcification. Skull: Normal. Negative for fracture or focal lesion. Sinuses/Orbits: No acute finding. IMPRESSION: Advanced chronic small vessel disease and brain atrophy. No acute superimposed finding. Electronically Signed   By: Tiburcio Pea M.D.   On: 03/11/2023 12:49        Scheduled Meds:  aspirin EC  81 mg Oral Daily   bisacodyl  10 mg Oral QHS   clopidogrel  75 mg Oral Daily   enoxaparin (LOVENOX) injection  40 mg Subcutaneous Q24H    pantoprazole  40 mg Oral Daily   polyethylene glycol  17 g Oral Daily   pravastatin  10 mg Oral q1800   Continuous Infusions:   LOS: 0 days     Tresa Moore, MD [facility]   If 7PM-7AM, please contact night-coverage  03/13/2023, 11:25 AM

## 2023-03-13 NOTE — Plan of Care (Signed)
  Problem: Education: Goal: Knowledge of disease or condition will improve Outcome: Progressing   Problem: Ischemic Stroke/TIA Tissue Perfusion: Goal: Complications of ischemic stroke/TIA will be minimized Outcome: Progressing   Problem: Coping: Goal: Will verbalize positive feelings about self Outcome: Progressing Goal: Will identify appropriate support needs Outcome: Progressing   Problem: Health Behavior/Discharge Planning: Goal: Ability to manage health-related needs will improve Outcome: Progressing Goal: Goals will be collaboratively established with patient/family Outcome: Progressing

## 2023-03-13 NOTE — Progress Notes (Addendum)
Neurology Progress Note   S:// Seen and examined.  No acute changes   O:// Current vital signs: BP (!) 124/90 (BP Location: Right Arm)   Pulse (!) 59   Temp 97.9 F (36.6 C) (Oral)   Resp 19   Ht 5\' 8"  (1.727 m)   SpO2 95%   BMI 24.94 kg/m  Vital signs in last 24 hours: Temp:  [97.5 F (36.4 C)-98.4 F (36.9 C)] 97.9 F (36.6 C) (11/02 0857) Pulse Rate:  [59-75] 59 (11/02 0857) Resp:  [16-19] 19 (11/02 0857) BP: (116-160)/(62-90) 124/90 (11/02 0857) SpO2:  [95 %-100 %] 95 % (11/02 0857) General: Awake alert in no distress HEENT: Normocephalic dermatic Lungs: Clear Cardiovascular: Regular rhythm Neurological exam Is awake alert orientedX 2 today.  This is improved from yesterday. Diminished attention concentration Mild dysarthria Naming repetition and comprehension intact. Cranial nerves II to XII: Subtle left nasolabial fold flattening but otherwise unremarkable. Motor examination with no drift but subjectively the left upper and lower extremity somewhat weaker than the right. Sensation intact light touch Coordination of difficulty perform heel-knee-shin-ataxic.  Gait testing observed while walking with PT-extremely hunched, using a walker very unstable on his feet.  Medications  Current Facility-Administered Medications:    acetaminophen (TYLENOL) tablet 650 mg, 650 mg, Oral, Q4H PRN **OR** acetaminophen (TYLENOL) 160 MG/5ML solution 650 mg, 650 mg, Per Tube, Q4H PRN **OR** acetaminophen (TYLENOL) suppository 650 mg, 650 mg, Rectal, Q4H PRN, Georgeann Oppenheim, Sudheer B, MD   aspirin EC tablet 81 mg, 81 mg, Oral, Daily, Georgeann Oppenheim, Sudheer B, MD, 81 mg at 03/13/23 2440   bisacodyl (DULCOLAX) EC tablet 10 mg, 10 mg, Oral, QHS, Sreenath, Sudheer B, MD, 10 mg at 03/12/23 2043   bisacodyl (DULCOLAX) suppository 10 mg, 10 mg, Rectal, Daily PRN, Georgeann Oppenheim, Sudheer B, MD   clopidogrel (PLAVIX) tablet 75 mg, 75 mg, Oral, Daily, Georgeann Oppenheim, Sudheer B, MD, 75 mg at 03/13/23 0833    enoxaparin (LOVENOX) injection 40 mg, 40 mg, Subcutaneous, Q24H, Sreenath, Sudheer B, MD, 40 mg at 03/12/23 1822   hydrALAZINE (APRESOLINE) injection 10 mg, 10 mg, Intravenous, Q6H PRN, Georgeann Oppenheim, Sudheer B, MD   pantoprazole (PROTONIX) EC tablet 40 mg, 40 mg, Oral, Daily, Georgeann Oppenheim, Sudheer B, MD, 40 mg at 03/13/23 0833   polyethylene glycol (MIRALAX / GLYCOLAX) packet 17 g, 17 g, Oral, Daily, Georgeann Oppenheim, Sudheer B, MD, 17 g at 03/13/23 0833   pravastatin (PRAVACHOL) tablet 10 mg, 10 mg, Oral, q1800, Lolita Patella B, MD, 10 mg at 03/12/23 1822   senna (SENOKOT) tablet 8.6 mg, 1 tablet, Oral, QHS PRN, Lolita Patella B, MD  Labs CBC    Component Value Date/Time   WBC 5.8 03/11/2023 1202   RBC 4.68 03/11/2023 1202   HGB 14.2 03/11/2023 1202   HGB 10.6 (L) 10/28/2011 0622   HCT 43.2 03/11/2023 1202   HCT 40.5 10/19/2011 0819   PLT 206 03/11/2023 1202   PLT 161 10/28/2011 0622   MCV 92.3 03/11/2023 1202   MCV 91 10/19/2011 0819   MCH 30.3 03/11/2023 1202   MCHC 32.9 03/11/2023 1202   RDW 13.8 03/11/2023 1202   RDW 14.3 10/19/2011 0819   LYMPHSABS 1.3 03/11/2023 1202   MONOABS 0.4 03/11/2023 1202   EOSABS 0.3 03/11/2023 1202   BASOSABS 0.0 03/11/2023 1202    CMP     Component Value Date/Time   NA 140 03/11/2023 1202   NA 140 10/28/2011 0622   K 4.0 03/11/2023 1202   K 4.1 10/28/2011 0622  CL 104 03/11/2023 1202   CL 108 (H) 10/28/2011 0622   CO2 27 03/11/2023 1202   CO2 24 10/28/2011 0622   GLUCOSE 94 03/11/2023 1202   GLUCOSE 81 10/28/2011 0622   BUN 12 03/11/2023 1202   BUN 17 10/28/2011 0622   CREATININE 0.94 03/11/2023 1202   CREATININE 1.12 10/28/2011 0622   CALCIUM 9.2 03/11/2023 1202   CALCIUM 7.9 (L) 10/28/2011 0622   PROT 6.8 03/11/2023 1202   ALBUMIN 3.7 03/11/2023 1202   AST 21 03/11/2023 1202   ALT 18 03/11/2023 1202   ALKPHOS 98 03/11/2023 1202   BILITOT 0.7 03/11/2023 1202   GFRNONAA >60 03/11/2023 1202   GFRNONAA >60 10/28/2011 0622   GFRAA >60  10/28/2011 0622    Lipid Panel     Component Value Date/Time   CHOL 147 03/12/2023 0500   TRIG 162 (H) 03/12/2023 0500   HDL 28 (L) 03/12/2023 0500   CHOLHDL 5.3 03/12/2023 0500   VLDL 32 03/12/2023 0500   LDLCALC 87 03/12/2023 0500    Lab Results  Component Value Date   HGBA1C 5.4 02/12/2023     Imaging I have reviewed images in epic and the results pertinent to this consultation are: CT angiography head and neck repeated yesterday reveals new focal calcification (and right cervical on the image on the left from yesterday compared to the image from 3 weeks ago on the right) in the proximal right P2 segment resulting in severe stenosis-worse than from a few weeks ago.  Additional multifocal severe stenosis of both PCAs are unchanged.  Mixed plaque resulting in approximately 50% stenosis of the proximal right ICA in the neck.  Fetal PCAs bilaterally.  Left carotid in the neck also likely 50% stenosis   Assessment: 89 year old past history of arthritis, hypertension, hyperlipidemia, recent multifocal and works primarily in the right hemisphere only, in the setting of fetal PCAs likely are attributable to the right lateral carotid, now with a new thalamic stroke. The light of recurrent strokes and due to the fact that his PCAs are both fetal origin-coming from the internal carotids-his right carotid is not symptomatic vessel and may need vascular surgery evaluation for CEA.   Impression: Acute ischemic stroke, right internal carotid stenosis  Recommendations: Continue DAPT for now Continue home statin for goal LDL less than 70 Vascular surgery consulted.  Appreciate Dr. Lenell Antu evaluating the patient at some point. Plan was discussed with the vascular surgeon as well as the hospitalist. Continue therapy assessments and disposition per therapy assessments. Follow-up in the neurology clinic in 6 to 8 weeks-preferably stroke neurology at [facility]-stroke NP/stroke  MD.  Discussed the plan with the patient.  Called the daughter who was busy with something but will update her as soon as she calls back.   Addendum: 12:20 PM. I was able to speak with the daughter and updated her on the plan above. She will await communication with vascular surgery.  -- Milon Dikes, MD Neurologist [facility] Pager: [phone]

## 2023-03-13 NOTE — Progress Notes (Signed)
Physical Therapy Treatment Patient Details Name: Thomas [name] MRN: [id number] DOB: 29-Sep-1930 Today's Date: 03/13/2023   History of Present Illness Pt is an 89 yo male with incoordination, trouble getting out of bed, and weakness the morning of presentation to the ED. MRI reveals new acute CVA. Per MD note, pt had a "recent CVA 3 weeks ago treated at [facility] was brought to the ED due to generalized weakness and dizziness being off balance x 1 day. Patient came home from [facility] rehab 7 days ago after multifocal small vessel infarcts. 1 night before presentation to [facility] family states the patient did not want to eat dinner and started complaining of dizziness." Other PMH includes HTN, hyperlipidemia, GERD, and B/L carpal tunnel.    PT Comments  Pt is making progress with mobility performing gait training with RW, Mod A +2 (for safety and recliner following).  Initially, required manual cues to coordinate advancing feet forward, gradually pt advancing steps forward with more initiation/coordination. Cues for sequencing and walker management and posture. Pt continues to require Mod A +2  for stand pivot transfers (for safety, pt still has trouble coming into full extension and at times leans posteriorly and left).  Continued PT will assist pt towards greater coordination, postural awareness, and activity tolerance with mobility.   If plan is discharge home, recommend the following: Two people to help with walking and/or transfers;Two people to help with bathing/dressing/bathroom;Assistance with cooking/housework;Assistance with feeding;Direct supervision/assist for medications management;Assist for transportation;Supervision due to cognitive status;Help with stairs or ramp for entrance   Can travel by private vehicle     Yes  Equipment Recommendations       Recommendations for Other Services Rehab consult     Precautions / Restrictions Precautions Precautions: Fall Precaution Comments: L lat tibial  wound Restrictions Weight Bearing Restrictions: No     Mobility  Bed Mobility Overal bed mobility: Needs Assistance Bed Mobility: Supine to Sit     Supine to sit: HOB elevated, Used rails, Contact guard     General bed mobility comments: increased effort and time;  requires Mod amount of sequencing cues    Transfers Overall transfer level: Needs assistance Equipment used: Rolling walker (2 wheels) Transfers: Sit to/from Stand, Bed to chair/wheelchair/BSC Sit to Stand: Min assist, Mod assist, +2 physical assistance   Step pivot transfers: Min assist, +2 physical assistance, Mod assist       General transfer comment: required sequencing cues for step pivot transfer and physical assist to move RW    Ambulation/Gait Ambulation/Gait assistance: Mod assist, +2 safety/equipment Gait Distance (Feet): 15 Feet Assistive device: Rolling walker (2 wheels) Gait Pattern/deviations: Decreased step length - right, Decreased step length - left, Decreased dorsiflexion - right, Decreased dorsiflexion - left, Decreased stride length, Shuffle Gait velocity: decreased     General Gait Details: initally required manual cues to coordinate advancing feet forward, gradually pt advancing steps forward with more initiation/coordination.  Cues for sequencing and walker management and posture.   Stairs             Wheelchair Mobility     Tilt Bed    Modified Rankin (Stroke Patients Only)       Balance Overall balance assessment: Needs assistance Sitting-balance support: Feet supported, No upper extremity supported Sitting balance-[name]: Fair Sitting balance - Comments: Inital Left lean sitting EOB, work on pt's weight shifting to the R then increasing midline awareness with upper trunk stretching, afteward pt was able to sit upright without UE support  and with dynamic reaching outside BOS. Postural control: Left lateral lean Standing balance support: Bilateral upper extremity  supported, Reliant on assistive device for balance, During functional activity Standing balance-[name]: Poor Standing balance comment: heavy reliance on RW during t/f                            Cognition Arousal: Alert Behavior During Therapy: [facility] for tasks assessed/performed   Area of Impairment: Problem solving, Awareness                   Current Attention Level: Sustained   Following Commands: Follows one step commands with increased time   Awareness: Emergent Problem Solving: Slow processing, Requires verbal cues, Requires tactile cues, Decreased initiation, Difficulty sequencing General Comments: pleasant and cooperative througout PT session        Exercises      General Comments        Pertinent Vitals/Pain Pain Assessment Pain Assessment: No/denies pain    Home Living                          Prior Function            PT Goals (current goals can now be found in the care plan section) Acute Rehab PT Goals Patient Stated Goal: to feel better PT Goal Formulation: With patient Time For Goal Achievement: 03/26/23 Potential to Achieve Goals: Good Progress towards PT goals: Progressing toward goals    Frequency    Min 1X/week      PT Plan      Co-evaluation              AM-PAC PT "6 Clicks" Mobility   Outcome Measure  Help needed turning from your back to your side while in a flat bed without using bedrails?: A Little Help needed moving from lying on your back to sitting on the side of a flat bed without using bedrails?: A Little Help needed moving to and from a bed to a chair (including a wheelchair)?: A Lot Help needed standing up from a chair using your arms (e.g., wheelchair or bedside chair)?: A Lot Help needed to walk in hospital room?: Total Help needed climbing 3-5 steps with a railing? : Total 6 Click Score: 12    End of Session Equipment Utilized During Treatment: Gait belt Activity Tolerance:  Patient limited by fatigue Patient left: with call bell/phone within reach;in chair;with chair alarm set;with family/visitor present Nurse Communication: Mobility status PT Visit Diagnosis: Unsteadiness on feet (R26.81);Other abnormalities of gait and mobility (R26.89);Repeated falls (R29.6);Muscle weakness (generalized) (M62.81);Hemiplegia and hemiparesis Hemiplegia - Right/Left: Left Hemiplegia - caused by: Cerebral infarction     Time: 1027-1106 PT Time Calculation (min) (ACUTE ONLY): 39 min  Charges:    $Gait Training: 8-22 mins $Therapeutic Activity: 23-37 mins PT General Charges $$ ACUTE PT VISIT: 1 Visit                     Hortencia Conradi, PTA  03/13/23, 11:34 AM

## 2023-03-14 DIAGNOSIS — I639 Cerebral infarction, unspecified: Secondary | ICD-10-CM | POA: Diagnosis not present

## 2023-03-14 NOTE — Plan of Care (Signed)
  Problem: Education: Goal: Knowledge of disease or condition will improve 03/14/2023 1752 by Mathis Fare, RN Outcome: Progressing 03/14/2023 1645 by Mathis Fare, RN Outcome: Progressing Goal: Knowledge of secondary prevention will improve (MUST DOCUMENT ALL) 03/14/2023 1752 by Mathis Fare, RN Outcome: Progressing 03/14/2023 1645 by Mathis Fare, RN Outcome: Progressing Goal: Knowledge of patient specific risk factors will improve Loraine Leriche N/A or DELETE if not current risk factor) Outcome: Progressing   Problem: Ischemic Stroke/TIA Tissue Perfusion: Goal: Complications of ischemic stroke/TIA will be minimized 03/14/2023 1752 by Mathis Fare, RN Outcome: Progressing 03/14/2023 1645 by Mathis Fare, RN Outcome: Progressing   Problem: Coping: Goal: Will verbalize positive feelings about self 03/14/2023 1752 by Mathis Fare, RN Outcome: Progressing 03/14/2023 1645 by Mathis Fare, RN Outcome: Progressing Goal: Will identify appropriate support needs 03/14/2023 1752 by Mathis Fare, RN Outcome: Progressing 03/14/2023 1645 by Mathis Fare, RN Outcome: Progressing   Problem: Health Behavior/Discharge Planning: Goal: Ability to manage health-related needs will improve 03/14/2023 1752 by Mathis Fare, RN Outcome: Progressing 03/14/2023 1645 by Mathis Fare, RN Outcome: Progressing Goal: Goals will be collaboratively established with patient/family Outcome: Progressing

## 2023-03-14 NOTE — Progress Notes (Signed)
  Chaplain On-Call responded to a call from [employer], who stated that the patient seems lonely and could benefit from Chaplaincy support.  Chaplain arrived on the Unit and received medical update from [place], who described the patient's challenges in recovering from two recent strokes.  Chaplain met the patient and his son-in-law Gigi Gin. Chaplain offered compassionate listening and encouragement.  Chaplain provided prayer and spiritual and emotional support.  Chaplain Evelena Peat M.Div., [facility]

## 2023-03-14 NOTE — Plan of Care (Signed)
Pending vascular surgery consult.  No new recs from neurology  Please call with questions as needed.   -- Milon Dikes, MD Neurologist [facility]

## 2023-03-14 NOTE — Progress Notes (Signed)
Physical Therapy Treatment Patient Details Name: Thomas [name] MRN: [id number] DOB: 1931/03/29 Today's Date: 03/14/2023   History of Present Illness Pt is an 89 yo male with incoordination, trouble getting out of bed, and weakness the morning of presentation to the ED. MRI reveals new acute CVA. Per MD note, pt had a "recent CVA 3 weeks ago treated at [facility] was brought to the ED due to generalized weakness and dizziness being off balance x 1 day. Patient came home from [facility] rehab 7 days ago after multifocal small vessel infarcts. 1 night before presentation to [facility] family states the patient did not want to eat dinner and started complaining of dizziness." Other PMH includes HTN, hyperlipidemia, GERD, and B/L carpal tunnel.    PT Comments  Pt in bed, ready to participate.  He transitions to EOB with increased time, cues and min a x 1 to get fully to EOB.  Steady in sitting and needing to void.  Attempted to use urinal with no success. He stands at EOB with mod a x 1 from elevated surface and upon standing needs BSC.  Attempted to transfer with RW but he is unable to take and steps and increasing assist.  Sat and stand pivot to [facility] with mod a x 1 but he does take fair quality steps to [facility].  Large soft/liquid BM.  Stands to RW for care with mod a x 1 to stand and full assist of tech for care.  BSC is removed and recliner brought behind him to allow him to sit. Controlled descent into chair by writer due to fatigue.   If plan is discharge home, recommend the following: Two people to help with walking and/or transfers;Two people to help with bathing/dressing/bathroom;Assistance with cooking/housework;Assistance with feeding;Direct supervision/assist for medications management;Assist for transportation;Supervision due to cognitive status;Help with stairs or ramp for entrance   Can travel by private vehicle     Yes  Equipment Recommendations       Recommendations for Other Services Rehab consult      Precautions / Restrictions Precautions Precautions: Fall Precaution Comments: L lat tibial wound Restrictions Weight Bearing Restrictions: No     Mobility  Bed Mobility Overal bed mobility: Needs Assistance Bed Mobility: Supine to Sit     Supine to sit: Mod assist     General bed mobility comments: increased effort and time;  requires Mod amount of sequencing cues Patient Response: Cooperative  Transfers Overall transfer level: Needs assistance Equipment used: Rolling walker (2 wheels) Transfers: Sit to/from Stand, Bed to chair/wheelchair/BSC Sit to Stand: Mod assist, From elevated surface   Step pivot transfers: Mod assist       General transfer comment: unable to step with walker today and +1 assist to [facility].  stand pivot +1 to BSC and +2 to get off/care    Ambulation/Gait         Gait velocity: decreased     General Gait Details: unable to take functional steps today   Stairs             Wheelchair Mobility     Tilt Bed Tilt Bed Patient Response: Cooperative  Modified Rankin (Stroke Patients Only)       Balance Overall balance assessment: Needs assistance Sitting-balance support: Feet supported, No upper extremity supported Sitting balance-[name]: Fair     Standing balance support: Bilateral upper extremity supported, Reliant on assistive device for balance, During functional activity Standing balance-[name]: Poor Standing balance comment: heavy reliance on RW during t/f  and for staff assist                            Cognition Arousal: Alert Behavior During Therapy: [facility] for tasks assessed/performed Overall Cognitive Status: No family/caregiver present to determine baseline cognitive functioning                                          Exercises      General Comments        Pertinent Vitals/Pain Pain Assessment Pain Assessment: Faces Faces Pain Scale: Hurts little more Pain Location: L  great toe to don sock.  no redness/warmth etc and did not seem to bother him with gait. Pain Descriptors / Indicators: Sore Pain Intervention(s): Limited activity within patient's tolerance, Monitored during session, Repositioned    Home Living                          Prior Function            PT Goals (current goals can now be found in the care plan section) Progress towards PT goals: Progressing toward goals    Frequency    Min 1X/week      PT Plan      Co-evaluation              AM-PAC PT "6 Clicks" Mobility   Outcome Measure  Help needed turning from your back to your side while in a flat bed without using bedrails?: A Little Help needed moving from lying on your back to sitting on the side of a flat bed without using bedrails?: A Little Help needed moving to and from a bed to a chair (including a wheelchair)?: A Lot Help needed standing up from a chair using your arms (e.g., wheelchair or bedside chair)?: A Lot Help needed to walk in hospital room?: Total Help needed climbing 3-5 steps with a railing? : Total 6 Click Score: 12    End of Session Equipment Utilized During Treatment: Gait belt Activity Tolerance: Patient limited by fatigue Patient left: with call bell/phone within reach;in chair;with chair alarm set;with family/visitor present Nurse Communication: Mobility status PT Visit Diagnosis: Unsteadiness on feet (R26.81);Other abnormalities of gait and mobility (R26.89);Repeated falls (R29.6);Muscle weakness (generalized) (M62.81);Hemiplegia and hemiparesis Hemiplegia - Right/Left: Left Hemiplegia - caused by: Cerebral infarction     Time: 8841-6606 PT Time Calculation (min) (ACUTE ONLY): 25 min  Charges:    $Therapeutic Activity: 23-37 mins PT General Charges $$ ACUTE PT VISIT: 1 Visit                   Danielle Dess, PTA 03/14/23, 12:54 PM

## 2023-03-14 NOTE — Consult Note (Signed)
[facility] [place]  ASSESSMENT / PLAN: Thomas [name] is a 89 y.o. male with symptomatic right >50% carotid artery stenosis.   Recommend:  Abstinence from all tobacco products. Blood glucose control with goal A1c < 7%. Blood pressure control with goal blood pressure < 140/90 mmHg. Lipid reduction therapy with goal LDL-C <100 mg/dL  Aspirin 81mg  PO QD.  Clopidogrel 75mg  PO QD. Atorvastatin 40-80mg  PO QD (or other "high intensity" statin therapy).  Counseled patient briefly on options for therapy for carotid stenosis including best medical therapy, stenting, and endarterectomy.   I explained that any intervention carries with the risk of periprocedural stroke, cranial nerve injury, bleeding.  I encouraged him to think about these options and discuss them with Dr. Wyn Quaker or Dr. Gilda Crease tomorrow.  CHIEF COMPLAINT: Recurrent stroke  HISTORY OF PRESENT ILLNESS: Thomas [name] is a 89 y.o. male admitted to the hospital with recurrent stroke.  He was evaluated by Dr. Wilford Corner of neurology yesterday.  Previously, he was felt to have posterior circulation infarcts, and as such revascularization of the carotid arteries was not felt to be a good idea given the limited benefit for this vascular distribution.  On further review, the patient was found to have fetal posterior cerebellar arteries bilaterally making his carotids more likely the source of recurrent stroke.  I discussed the options available for management of symptomatic carotid artery stenosis including best medical therapy, carotid endarterectomy, and carotid stenting.  I explained that Dr. Wyn Quaker or Dr. Gilda Crease will evaluate these again with the patient and his family tomorrow and together they should make a decision about what they feel is best.  Past Medical History:  Diagnosis Date   Arthritis    Carpal tunnel syndrome on both sides    GERD (gastroesophageal reflux disease)    Hyperlipidemia    Hypertension     Past  Surgical History:  Procedure Laterality Date   INGUINAL HERNIA REPAIR  2001   REPLACEMENT TOTAL KNEE Bilateral     Family History  Problem Relation Age of Onset   Cancer Sister        Liver cancer   Melanoma Sister    Cancer Brother        Pancreatic   Cancer Brother        Lung cancer   Heart disease Brother     Social History   Socioeconomic History   Marital status: Widowed    Spouse name: Not on file   Number of children: Not on file   Years of education: Not on file   Highest education level: Not on file  Occupational History   Not on file  Tobacco Use   Smoking status: Never   Smokeless tobacco: Never  Vaping Use   Vaping status: Never Used  Substance and Sexual Activity   Alcohol use: Never   Drug use: Never   Sexual activity: Not on file  Other Topics Concern   Not on file  Social History Narrative   Not on file   Social Determinants of Health   Financial Resource Strain: Low Risk  (10/12/2022)   Received from [facility]   Overall Financial Resource Strain (CARDIA)    Difficulty of Paying Living Expenses: Not hard at all  Food Insecurity: No Food Insecurity (03/12/2023)   Hunger Vital Sign    Worried About Running Out of Food in the Last Year: Never true    Ran Out of Food in the Last Year:  Never true  Transportation Needs: No Transportation Needs (03/12/2023)   PRAPARE - Administrator, Civil Service (Medical): No    Lack of Transportation (Non-Medical): No  Physical Activity: Not on file  Stress: Not on file  Social Connections: Not on file  Intimate Partner Violence: Not At Risk (03/12/2023)   Humiliation, Afraid, Rape, and Kick questionnaire    Fear of Current or Ex-Partner: No    Emotionally Abused: No    Physically Abused: No    Sexually Abused: No    No Known Allergies  Current Facility-Administered Medications  Medication Dose Route Frequency Provider Last Rate Last Admin   acetaminophen (TYLENOL) tablet  650 mg  650 mg Oral Q4H PRN Lolita Patella B, MD       Or   acetaminophen (TYLENOL) 160 MG/5ML solution 650 mg  650 mg Per Tube Q4H PRN Lolita Patella B, MD       Or   acetaminophen (TYLENOL) suppository 650 mg  650 mg Rectal Q4H PRN Lolita Patella B, MD       aspirin EC tablet 81 mg  81 mg Oral Daily Lolita Patella B, MD   81 mg at 03/14/23 0828   bisacodyl (DULCOLAX) EC tablet 10 mg  10 mg Oral QHS Sreenath, Sudheer B, MD   10 mg at 03/13/23 2051   bisacodyl (DULCOLAX) suppository 10 mg  10 mg Rectal Daily PRN Lolita Patella B, MD       clopidogrel (PLAVIX) tablet 75 mg  75 mg Oral Daily Georgeann Oppenheim, Sudheer B, MD   75 mg at 03/14/23 0828   enoxaparin (LOVENOX) injection 40 mg  40 mg Subcutaneous Q24H Sreenath, Sudheer B, MD   40 mg at 03/13/23 1745   hydrALAZINE (APRESOLINE) injection 10 mg  10 mg Intravenous Q6H PRN Lolita Patella B, MD       melatonin tablet 2.5 mg  2.5 mg Oral QHS Sreenath, Sudheer B, MD   2.5 mg at 03/13/23 2051   pantoprazole (PROTONIX) EC tablet 40 mg  40 mg Oral Daily Lolita Patella B, MD   40 mg at 03/14/23 0828   polyethylene glycol (MIRALAX / GLYCOLAX) packet 17 g  17 g Oral Daily Lolita Patella B, MD   17 g at 03/14/23 0828   pravastatin (PRAVACHOL) tablet 10 mg  10 mg Oral q1800 Lolita Patella B, MD   10 mg at 03/13/23 1745   senna (SENOKOT) tablet 8.6 mg  1 tablet Oral QHS PRN Lolita Patella B, MD        PHYSICAL EXAM Vitals:   03/13/23 1949 03/13/23 2348 03/14/23 0348 03/14/23 0818  BP: 121/71 116/63 128/69 122/78  Pulse: 68 63 63 (!) 59  Resp: 12 20 12 16   Temp: 98.3 F (36.8 C) 97.6 F (36.4 C) 98 F (36.7 C) 98 F (36.7 C)  TempSrc: Oral Oral Oral   SpO2: 94% 92% 95% 94%  Height:       Elderly man in no distress Regular rate and rhythm Unlabored breathing No focal neurologic deficits   PERTINENT LABORATORY AND RADIOLOGIC DATA  Most recent CBC    Latest Ref Rng & Units 03/11/2023   12:02 PM 02/18/2023    8:10  AM 02/14/2023    8:31 AM  CBC  WBC 4.0 - 10.5 K/uL 5.8  6.0  6.2   Hemoglobin 13.0 - 17.0 g/dL 82.9  56.2  13.0   Hematocrit 39.0 - 52.0 % 43.2  37.5  38.9   Platelets 150 -  400 K/uL 206  228  193      Most recent CMP    Latest Ref Rng & Units 03/11/2023   12:02 PM 02/19/2023    3:49 AM 02/18/2023    8:10 AM  CMP  Glucose 70 - 99 mg/dL 94   91   BUN 8 - 23 mg/dL 12   19   Creatinine 1.60 - 1.24 mg/dL 1.09  3.23  5.57   Sodium 135 - 145 mmol/L 140   138   Potassium 3.5 - 5.1 mmol/L 4.0   3.6   Chloride 98 - 111 mmol/L 104   106   CO2 22 - 32 mmol/L 27   25   Calcium 8.9 - 10.3 mg/dL 9.2   8.6   Total Protein 6.5 - 8.1 g/dL 6.8     Total Bilirubin 0.3 - 1.2 mg/dL 0.7     Alkaline Phos 38 - 126 U/L 98     AST 15 - 41 U/L 21     ALT 0 - 44 U/L 18       Renal function CrCl cannot be calculated (Unknown ideal weight.).  Hgb A1c MFr Bld (%)  Date Value  02/12/2023 5.4    LDL Cholesterol  Date Value Ref Range Status  03/12/2023 87 0 - 99 mg/dL Final    Comment:           Total Cholesterol/HDL:CHD Risk Coronary Heart Disease Risk Table                     Men   Women  1/2 Average Risk   3.4   3.3  Average Risk       5.0   4.4  2 X Average Risk   9.6   7.1  3 X Average Risk  23.4   11.0        Use the calculated Patient Ratio above and the CHD Risk Table to determine the patient's CHD Risk.        ATP III CLASSIFICATION (LDL):  <100     mg/dL   Optimal  322-025  mg/dL   Near or Above                    Optimal  130-159  mg/dL   Borderline  427-062  mg/dL   High  >376     mg/dL   Very High Performed at [facility], [address] [place], [place] [address]     CT angiogram personally reviewed.  Right carotid artery stenosis appears visually worse than 50% estimated by radiologist measurement.  There is both calcific and soft atheroma in the carotid artery.  MRI brain 1. Acute infarct of the right anterior thalamus. No acute hemorrhage or  significant mass effect. 2. 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  Rande Brunt. Lenell Antu, MD FACS [facility] Phone Number: [phone] 03/14/2023 1:06 PM   Total time spent on preparing this encounter including chart review, data review, collecting history, examining the patient, coordinating care for this new patient, 60 minutes.  Portions of this report may have been transcribed using voice recognition software.  Every effort has been made to ensure accuracy; however, inadvertent computerized transcription errors may still be present.

## 2023-03-14 NOTE — Progress Notes (Signed)
PROGRESS NOTE    Thomas [name]  NFA:[id number] DOB: [birth year] DOA: 03/11/2023 PCP: Barbette Reichmann, MD    Brief Narrative:   89 y.o. male with medical history significant of hypertension, hyperlipidemia, GERD, recent CVA 3 weeks ago treated at [facility] was brought to the ED due to generalized weakness and dizziness being off balance x 1 day.  Patient came home from [facility] rehab 7 days ago after multifocal small vessel infarcts.  Was doing well at home.  Ambulating on his own with good functional status, able to perform basic ADLs.   1 night before presentation to [facility] family states the patient did not want to eat dinner and started complaining of dizziness.  The morning of presentation patient was having trouble with coordination unable to raise himself out of bed.  No evidence of trauma.   On presentation to the ED the patient is hemodynamically stable.  MRI reveals new acute CVA.  On my interview the patient is resting comfortably in bed.  Son-in-law at bedside.  Primary caregiver.  States that the patient was discharged on dual antiplatelet therapy aspirin and Plavix and has been compliant with all medications.  11/1: Patient seen in consultation by neurology who ordered CTA to evaluate carotid anatomy.  Pending CTA results and possible vascular surgery consult.  Pending PT and OT evaluations.  11/2: Case discussed at length with neurology.  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  Vascular surgery engaged for comment consideration of carotid endarterectomy  11/3: Pending vascular surgery evaluation and recommendations    Assessment & Plan:   Principal Problem:   Acute CVA (cerebrovascular accident) [facility])  Acute CVA Patient presents 3 weeks after previous hospitalization and discharged from [facility].  Now with evidence of new acute CVA on imaging.  Patient son-in-law at bedside reports compliance with  dual antiplatelet therapy and antihyperlipidemic therapy at home. Seen in consultation by neurology Concerned that recurrent strokes within the same vascular territory are secondary to calcium emboli Plan: Continue DAPT Continue statin, lipid profile at goal Continue therapy as tolerated Diet advanced per SLP recommendations Disc cardiac monitor Vascular consult.  Dr. Lenell Antu engaged.  Consideration for carotid endarterectomy TOC consult for placement.  Will need skilled nursing facility    Essential hypertension Blood pressure controlled Normotensive blood pressure goals   Hyperlipidemia PTA statin.  Lipid profile at goal   GERD PPI    DVT prophylaxis: SQ Lovenox Code Status: DNR Family Communication:  Disposition Plan: Status is: Observation The patient will require care spanning > 2 midnights and should be moved to inpatient because: Acute CVA.  Concern for calcium emboli within the PCA distribution.  Vascular surgery engaged.  Pending official consult and recommendations.   Level of care: Telemetry Medical  Consultants:  Neurology  Procedures:  None  Antimicrobials: None   Subjective: Seen and examined.  Sitting up in chair.  No visible distress  Objective: Vitals:   03/13/23 1949 03/13/23 2348 03/14/23 0348 03/14/23 0818  BP: 121/71 116/63 128/69 122/78  Pulse: 68 63 63 (!) 59  Resp: 12 20 12 16   Temp: 98.3 F (36.8 C) 97.6 F (36.4 C) 98 F (36.7 C) 98 F (36.7 C)  TempSrc: Oral Oral Oral   SpO2: 94% 92% 95% 94%  Height:        Intake/Output Summary (Last 24 hours) at 03/14/2023 1107 Last data filed at 03/14/2023 0903 Gross per 24 hour  Intake  480 ml  Output 350 ml  Net 130 ml   There were no vitals filed for this visit.  Examination:  General exam NAD Respiratory system: Lungs clear.  Normal work of breathing.  Room air Cardiovascular system: S1-2, RRR, no murmurs, no pedal edema Gastrointestinal system: Soft, NT/ND, normal bowel  sounds Central nervous system: Alert.  Oriented x 2.  Over extremity weakness noted. Extremities: Symmetric 5 x 5 power.  Gait not assessed Skin: No rashes, lesions or ulcers Psychiatry: Judgement and insight appear normal. Mood & affect appropriate.     Data Reviewed: I have personally reviewed following labs and imaging studies  CBC: Recent Labs  Lab 03/11/23 1202  WBC 5.8  NEUTROABS 3.8  HGB 14.2  HCT 43.2  MCV 92.3  PLT 206   Basic Metabolic Panel: Recent Labs  Lab 03/11/23 1202  NA 140  K 4.0  CL 104  CO2 27  GLUCOSE 94  BUN 12  CREATININE 0.94  CALCIUM 9.2   GFR: CrCl cannot be calculated (Unknown ideal weight.). Liver Function Tests: Recent Labs  Lab 03/11/23 1202  AST 21  ALT 18  ALKPHOS 98  BILITOT 0.7  PROT 6.8  ALBUMIN 3.7   No results for input(s): "LIPASE", "AMYLASE" in the last 168 hours. No results for input(s): "AMMONIA" in the last 168 hours. Coagulation Profile: Recent Labs  Lab 03/11/23 1359  INR 1.1   Cardiac Enzymes: No results for input(s): "CKTOTAL", "CKMB", "CKMBINDEX", "TROPONINI" in the last 168 hours. BNP (last 3 results) No results for input(s): "PROBNP" in the last 8760 hours. HbA1C: No results for input(s): "HGBA1C" in the last 72 hours. CBG: Recent Labs  Lab 03/11/23 1149  GLUCAP 103*   Lipid Profile: Recent Labs    03/12/23 0500  CHOL 147  HDL 28*  LDLCALC 87  TRIG 161*  CHOLHDL 5.3   Thyroid Function Tests: No results for input(s): "TSH", "T4TOTAL", "FREET4", "T3FREE", "THYROIDAB" in the last 72 hours. Anemia Panel: No results for input(s): "VITAMINB12", "FOLATE", "FERRITIN", "TIBC", "IRON", "RETICCTPCT" in the last 72 hours. Sepsis Labs: No results for input(s): "PROCALCITON", "LATICACIDVEN" in the last 168 hours.  Recent Results (from the past 240 hour(s))  SARS Coronavirus 2 by RT PCR (hospital order, performed in [facility] hospital lab) *cepheid single result test* Anterior Nasal Swab      Status: None   Collection Time: 03/11/23  1:59 PM   Specimen: Anterior Nasal Swab  Result Value Ref Range Status   SARS Coronavirus 2 by RT PCR NEGATIVE NEGATIVE Final    Comment: (NOTE) SARS-CoV-2 target nucleic acids are NOT DETECTED.  The SARS-CoV-2 RNA is generally detectable in upper and lower respiratory specimens during the acute phase of infection. The lowest concentration of SARS-CoV-2 viral copies this assay can detect is 250 copies / mL. A negative result does not preclude SARS-CoV-2 infection and should not be used as the sole basis for treatment or other patient management decisions.  A negative result may occur with improper specimen collection / handling, submission of specimen other than nasopharyngeal swab, presence of viral mutation(s) within the areas targeted by this assay, and inadequate number of viral copies (<250 copies / mL). A negative result must be combined with clinical observations, patient history, and epidemiological information.  Fact Sheet for Patients:   RoadLapTop.co.za  Fact Sheet for Healthcare Providers: http://kim-miller.com/  This test is not yet approved or  cleared by the [place] FDA and has been authorized for detection and/or diagnosis of  SARS-CoV-2 by FDA under an Emergency Use Authorization (EUA).  This EUA will remain in effect (meaning this test can be used) for the duration of the COVID-19 declaration under Section 564(b)(1) of the Act, 21 U.S.C. section 360bbb-3(b)(1), unless the authorization is terminated or revoked sooner.  Performed at [facility], [address], [place], [place] [address]          Radiology Studies: CT ANGIO HEAD NECK W WO CM  Result Date: 03/12/2023 CLINICAL DATA:  Stroke/TIA, determine embolic source. EXAM: CT ANGIOGRAPHY HEAD AND NECK WITH AND WITHOUT CONTRAST TECHNIQUE: Multidetector CT imaging of the head and neck was performed using  the standard protocol during bolus administration of intravenous contrast. Multiplanar CT image reconstructions and MIPs were obtained to evaluate the vascular anatomy. Carotid stenosis measurements (when applicable) are obtained utilizing NASCET criteria, using the distal internal carotid diameter as the denominator. RADIATION DOSE REDUCTION: This exam was performed according to the departmental dose-optimization program which includes automated exposure control, adjustment of the mA and/or kV according to patient size and/or use of iterative reconstruction technique. CONTRAST:  75mL OMNIPAQUE IOHEXOL 350 MG/ML SOLN COMPARISON:  Brain MRI 1 day prior, CTA head/neck 02/12/2023 FINDINGS: CT HEAD FINDINGS Brain: The known infarct in the right thalamus is again seen, better seen on prior MRI. There is no evidence of new acute infarct. There is no acute intracranial hemorrhage or extra-axial fluid collection. Parenchymal volume loss with prominence of the ventricular system and extra-axial CSF spaces is stable. The ventricles are stable in size. Advanced background chronic small-vessel ischemic change is stable. Multiple small remote infarcts are again noted in the bilateral basal ganglia, thalami, and cerebellar hemispheres. The pituitary and suprasellar region are normal. There is no mass lesion. There is no mass effect or midline shift. Vascular: See below. Skull: Normal. Negative for fracture or focal lesion. Sinuses/Orbits: The paranasal sinuses are clear. Bilateral lens implants are in place. The globes and orbits are otherwise unremarkable. Other: The mastoid air cells and middle ear cavities are clear. Review of the MIP images confirms the above findings CTA NECK FINDINGS Aortic arch: There is calcified plaque in the imaged aortic arch. The origins of the major branch vessels are patent. The subclavian arteries are patent to the level imaged. Right carotid system: The right common carotid artery is patent. There  is mixed plaque at the bifurcation resulting in approximately 50% stenosis of the proximal internal carotid artery, not significantly changed. The distal internal carotid artery is widely patent. The external carotid artery is patent. There is no evidence of dissection or aneurysm. Left carotid system: The left common carotid artery is patent. There is calcified plaque at the bifurcation suspected to result in approximately 50% stenosis of the proximal internal carotid artery, though measurement is made difficult by significant blooming artifact from the calcium. Findings are unchanged. The external carotid artery is patent. There is no evidence of dissection or aneurysm. Vertebral arteries: The vertebral arteries are patent, without hemodynamically significant stenosis or occlusion. There is no evidence of dissection or aneurysm. Skeleton: There is no acute osseous abnormality or suspicious osseous lesion. There is advanced multilevel degenerative change throughout the cervical spine. There is no visible canal hematoma or high-grade spinal canal stenosis. Other neck: A 3.7 cm x 4.8 cm x 4.2 cm pathologic lymph node in the right neck is not significantly changed, present since 2019. Upper chest: The imaged lung apices are clear. Review of the MIP images confirms the above findings CTA HEAD  FINDINGS Anterior circulation: There is calcified plaque in the intracranial ICAs without significant stenosis or occlusion The bilateral MCAs are patent, without proximal stenosis or occlusion. The left A1 segment is hypoplastic, a normal variant. The ACAS are otherwise patent, without proximal stenosis or occlusion. 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 Posterior circulation: The bilateral V4 segments are patent with mild stenosis on the left. The basilar artery is patent but diminutive distally. The major cerebellar arteries  appear patent. The PCAs are primarily fed by posterior communicating arteries bilaterally (fetal origins). There is new focal calcification in the proximal right P2 segment resulting in severe stenosis (11-99, 10-124). There is focal severe stenosis of the right P2/P3 segment (10-110), unchanged. There is multifocal severe stenosis of the left PCA at the proximal P2 segment (11-93), more distal P2 segment (11-102), and P3 segment (11-140). There is no aneurysm or AVM. Venous sinuses: Not well evaluated due to bolus timing. Anatomic variants: As above. Review of the MIP images confirms the above findings IMPRESSION: 1. The known right thalamic infarct is again seen, better evaluated on MRI from 1 day prior. No new acute intracranial pathology. 2. New focal calcification in the proximal right P2 segment resulting in severe stenosis. Additional multifocal severe stenosis of both PCAs is unchanged. 3. Mixed plaque resulting in approximately 50% stenosis of the proximal right ICA in the neck, unchanged. 4. Calcified plaque at the left carotid bifurcation is also suspected to resulting in approximately 50% stenosis, though evaluation is made difficult by blooming artifact from the calcium. 5. 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 6. Unchanged right neck mass. Electronically Signed   By: Lesia Hausen M.D.   On: 03/12/2023 12:51        Scheduled Meds:  aspirin EC  81 mg Oral Daily   bisacodyl  10 mg Oral QHS   clopidogrel  75 mg Oral Daily   enoxaparin (LOVENOX) injection  40 mg Subcutaneous Q24H   melatonin  2.5 mg Oral QHS   pantoprazole  40 mg Oral Daily   polyethylene glycol  17 g Oral Daily   pravastatin  10 mg Oral q1800   Continuous Infusions:   LOS: 0 days     Tresa Moore, MD [facility]   If 7PM-7AM, please contact night-coverage  03/14/2023, 11:07 AM

## 2023-03-14 NOTE — Plan of Care (Signed)
  Problem: Education: Goal: Knowledge of disease or condition will improve Outcome: Progressing Goal: Knowledge of secondary prevention will improve (MUST DOCUMENT ALL) Outcome: Progressing   Problem: Ischemic Stroke/TIA Tissue Perfusion: Goal: Complications of ischemic stroke/TIA will be minimized Outcome: Progressing   Problem: Coping: Goal: Will verbalize positive feelings about self Outcome: Progressing Goal: Will identify appropriate support needs Outcome: Progressing   Problem: Health Behavior/Discharge Planning: Goal: Ability to manage health-related needs will improve Outcome: Progressing Goal: Goals will be collaboratively established with patient/family Outcome: Progressing

## 2023-03-15 DIAGNOSIS — I672 Cerebral atherosclerosis: Secondary | ICD-10-CM

## 2023-03-15 DIAGNOSIS — I10 Essential (primary) hypertension: Secondary | ICD-10-CM | POA: Diagnosis not present

## 2023-03-15 DIAGNOSIS — Z7902 Long term (current) use of antithrombotics/antiplatelets: Secondary | ICD-10-CM

## 2023-03-15 DIAGNOSIS — Q283 Other malformations of cerebral vessels: Secondary | ICD-10-CM | POA: Diagnosis not present

## 2023-03-15 DIAGNOSIS — Z79899 Other long term (current) drug therapy: Secondary | ICD-10-CM

## 2023-03-15 DIAGNOSIS — I2510 Atherosclerotic heart disease of native coronary artery without angina pectoris: Secondary | ICD-10-CM | POA: Diagnosis not present

## 2023-03-15 DIAGNOSIS — I6521 Occlusion and stenosis of right carotid artery: Secondary | ICD-10-CM | POA: Diagnosis not present

## 2023-03-15 DIAGNOSIS — E785 Hyperlipidemia, unspecified: Secondary | ICD-10-CM

## 2023-03-15 DIAGNOSIS — I639 Cerebral infarction, unspecified: Secondary | ICD-10-CM | POA: Diagnosis not present

## 2023-03-15 LAB — BASIC METABOLIC PANEL
Anion gap: 7 (ref 5–15)
BUN: 16 mg/dL (ref 8–23)
CO2: 25 mmol/L (ref 22–32)
Calcium: 9.1 mg/dL (ref 8.9–10.3)
Chloride: 106 mmol/L (ref 98–111)
Creatinine, Ser: 1.02 mg/dL (ref 0.61–1.24)
GFR, Estimated: >60 mL/min (ref >60)
Glucose, Bld: 143 mg/dL — ABNORMAL HIGH (ref 70–99)
Potassium: 3.6 mmol/L (ref 3.5–5.1)
Sodium: 138 mmol/L (ref 135–145)

## 2023-03-15 LAB — CBC WITH DIFFERENTIAL/PLATELET
Abs Immature Granulocytes: 0.01 10*3/uL (ref 0.00–0.07)
Basophils Absolute: 0 10*3/uL (ref 0.0–0.1)
Basophils Relative: 1 %
Eosinophils Absolute: 0.3 10*3/uL (ref 0.0–0.5)
Eosinophils Relative: 8 %
HCT: 41.6 % (ref 39.0–52.0)
Hemoglobin: 13.7 g/dL (ref 13.0–17.0)
Immature Granulocytes: 0 %
Lymphocytes Relative: 27 %
Lymphs Abs: 1.1 10*3/uL (ref 0.7–4.0)
MCH: 30 pg (ref 26.0–34.0)
MCHC: 32.9 g/dL (ref 30.0–36.0)
MCV: 91.2 fL (ref 80.0–100.0)
Monocytes Absolute: 0.2 10*3/uL (ref 0.1–1.0)
Monocytes Relative: 4 %
Neutro Abs: 2.6 10*3/uL (ref 1.7–7.7)
Neutrophils Relative %: 60 %
Platelets: 206 10*3/uL (ref 150–400)
RBC: 4.56 MIL/uL (ref 4.22–5.81)
RDW: 13.7 % (ref 11.5–15.5)
WBC: 4.3 10*3/uL (ref 4.0–10.5)
nRBC: 0 % (ref 0.0–0.2)

## 2023-03-15 MED ORDER — SERTRALINE HCL 50 MG PO TABS
25.0000 mg | ORAL_TABLET | Freq: Every day | ORAL | Status: DC
  Administered 2023-03-15 – 2023-04-19 (×35): 25 mg via ORAL
  Filled 2023-03-15 (×35): qty 1

## 2023-03-15 MED ORDER — DICLOFENAC SODIUM 1 % EX GEL
4.0000 g | Freq: Four times a day (QID) | CUTANEOUS | Status: DC
  Administered 2023-03-15 – 2023-03-18 (×15): 4 g via TOPICAL
  Filled 2023-03-15: qty 100

## 2023-03-15 NOTE — Plan of Care (Signed)
  Problem: Education: Goal: Knowledge of disease or condition will improve Outcome: Progressing   Problem: Ischemic Stroke/TIA Tissue Perfusion: Goal: Complications of ischemic stroke/TIA will be minimized Outcome: Progressing   Problem: Coping: Goal: Will identify appropriate support needs Outcome: Progressing   Problem: Self-Care: Goal: Ability to participate in self-care as condition permits will improve Outcome: Progressing

## 2023-03-15 NOTE — H&P (View-Only) (Signed)
 [facility]  Daily Progress Note   Subjective  -   Patient sitting up in a chair eating dinner.  His daughter is present.  Patient feels he still has some weakness he worked with physical therapy and is having some difficulty with walking  Objective Vitals:   03/14/23 2019 03/15/23 0556 03/15/23 1811 03/15/23 2001  BP: 118/84 (!) 140/83 121/75 123/88  Pulse: 71 (!) 58 78 64  Resp: 18 18 18 17   Temp: 98.2 F (36.8 C) 97.7 F (36.5 C) 98.1 F (36.7 C) 98.2 F (36.8 C)  TempSrc:      SpO2: 95% 95% 94% 95%  Height:        Intake/Output Summary (Last 24 hours) at 03/15/2023 2058 Last data filed at 03/15/2023 3329 Gross per 24 hour  Intake --  Output 400 ml  Net -400 ml    PULM  Normal effort , no use of accessory muscles CV  No JVD, RRR Abd      No distended, nontender Neuro no evidence for aphasia.  No facial droop.  Upper and lower extremities demonstrate good strength and motion.  Laboratory CBC    Component Value Date/Time   WBC 4.3 03/15/2023 1128   HGB 13.7 03/15/2023 1128   HGB 10.6 (L) 10/28/2011 0622   HCT 41.6 03/15/2023 1128   HCT 40.5 10/19/2011 0819   PLT 206 03/15/2023 1128   PLT 161 10/28/2011 0622    BMET    Component Value Date/Time   NA 138 03/15/2023 1128   NA 140 10/28/2011 0622   K 3.6 03/15/2023 1128   K 4.1 10/28/2011 0622   CL 106 03/15/2023 1128   CL 108 (H) 10/28/2011 0622   CO2 25 03/15/2023 1128   CO2 24 10/28/2011 0622   GLUCOSE 143 (H) 03/15/2023 1128   GLUCOSE 81 10/28/2011 0622   BUN 16 03/15/2023 1128   BUN 17 10/28/2011 0622   CREATININE 1.02 03/15/2023 1128   CREATININE 1.12 10/28/2011 0622   CALCIUM 9.1 03/15/2023 1128   CALCIUM 7.9 (L) 10/28/2011 0622   GFRNONAA >60 03/15/2023 1128   GFRNONAA >60 10/28/2011 0622   GFRAA >60 10/28/2011 0622    Assessment/Planning: Carotid stenosis: Recommend:  The patient is symptomatic with respect to the right internal carotid artery stenosis.  I  have personally reviewed the CT angiogram and believe the patient has now progressed and has a lesion the is >75%.  Patient's CT angiography of the carotid arteries confirms >75% right ICA stenosis.  The anatomical considerations support surgery over stenting.  This was discussed in detail with the patient.  The patient does indeed need surgery, therefore, cardiac clearance will be arranged. Once cleared the patient will be scheduled for surgery.  I have completed Shared Decision-Making with Madden Garron Lewisprior to carotid surgery/intervention. The conversation included: -Discussion of all treatment options including carotid endarterectomy (CEA), carotid artery stenting (which includes transcarotid artery revascularization (TCAR)), and optimal medical therapy (OMT). -Explanation of risks and benefits for each option specific to Antionette Char Claw's clinical situation. -Integration of clinical guidelines as it relates to the patient's history and comorbidities. -Discussion and incorporation of [name] and their personal preferences and priorities in choosing a treatment plan plan  If the patient was unable to participate in Shared Decision Making this process was done with both the patient and his daughter.  The patient's NIHSS score is as follows: 4 Mild: 1 - 5 Mild to Moderately Severe: 5 - 14  Severe: 15 - 24 Very Severe: >25  The risks, benefits and alternative therapies were reviewed in detail with the patient.  All questions were answered.  The patient agrees to proceed with surgery of the right carotid artery.  Continue antiplatelet therapy as prescribed. Continue management of CAD, HTN and Hyperlipidemia. Healthy heart diet, encouraged exercise at least 4 times per week.   2.  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  Given this the thalamic distribution is likely dependent on the carotid flow and not the posterior circulation.  This supports treating his right carotid stenosis.  Otherwise, he will continue his dual antiplatelet therapy and high-dose statin therapy.  3.  Hypertension: Continue antihypertensive medications as already ordered, these medications have been reviewed and there are no changes at this time.    4.  Hyperlipidemia: Continue statin as ordered and reviewed, no changes at this time   Levora Dredge  03/15/2023, 8:58 PM

## 2023-03-15 NOTE — Progress Notes (Signed)
Physical Therapy Treatment Patient Details Name: Thomas [name] MRN: [id number] DOB: Sep 27, 1930 Today's Date: 03/15/2023   History of Present Illness Pt is an 89 yo male with incoordination, trouble getting out of bed, and weakness the morning of presentation to the ED. MRI reveals new acute CVA. Per MD note, pt had a "recent CVA 3 weeks ago treated at [facility] was brought to the ED due to generalized weakness and dizziness being off balance x 1 day. Patient came home from [facility] rehab 7 days ago after multifocal small vessel infarcts. 1 night before presentation to [facility] family states the patient did not want to eat dinner and started complaining of dizziness." Other PMH includes HTN, hyperlipidemia, GERD, and B/L carpal tunnel.    PT Comments  Co-tx with OT due to level of assist needed for mobility.  He remains motivated for session but continues to need mod a x 1 or min a x 2 for bed mobility and sit to stand transfers.  Gait remains challenging for pt and he needs heavy cues and mod a x 2 to attempt sidesteps and complete transfer to chair.  Heavy left lean with cues and assist to attempt to correct.  Generally fatigued with activity but remained in recliner after session.  Tray had arrived and OT remains in room to assist with feeding interventions.   If plan is discharge home, recommend the following: Two people to help with walking and/or transfers;Two people to help with bathing/dressing/bathroom;Assistance with cooking/housework;Assistance with feeding;Direct supervision/assist for medications management;Assist for transportation;Supervision due to cognitive status;Help with stairs or ramp for entrance   Can travel by private vehicle        Equipment Recommendations       Recommendations for Other Services       Precautions / Restrictions Precautions Precautions: Fall Restrictions Weight Bearing Restrictions: No     Mobility  Bed Mobility Overal bed mobility: Needs Assistance Bed  Mobility: Supine to Sit     Supine to sit: Mod assist, +2 for physical assistance       Patient Response: Cooperative  Transfers Overall transfer level: Needs assistance Equipment used: Rolling walker (2 wheels) Transfers: Sit to/from Stand, Bed to chair/wheelchair/BSC Sit to Stand: Mod assist, Min assist, +2 physical assistance   Step pivot transfers: Mod assist, +2 physical assistance       General transfer comment: cues to sequence, assist for RW advancement and to offload L side for LLE advancement    Ambulation/Gait Ambulation/Gait assistance: Mod assist, +2 safety/equipment Gait Distance (Feet): 3 Feet Assistive device: Rolling walker (2 wheels)   Gait velocity: decreased     General Gait Details: sidesteps to chair at bedside - struggles   Stairs             Wheelchair Mobility     Tilt Bed Tilt Bed Patient Response: Cooperative  Modified Rankin (Stroke Patients Only)       Balance Overall balance assessment: Needs assistance Sitting-balance support: Feet supported, No upper extremity supported Sitting balance-[name]: Fair     Standing balance support: Bilateral upper extremity supported, Reliant on assistive device for balance, During functional activity Standing balance-[name]: Poor                              Cognition Arousal: Alert Behavior During Therapy: WFL for tasks assessed/performed Overall Cognitive Status: No family/caregiver present to determine baseline cognitive functioning Area of Impairment: Following commands, Safety/judgement, Problem  solving                       Following Commands: Follows one step commands with increased time Safety/Judgement: Decreased awareness of safety, Decreased awareness of deficits   Problem Solving: Slow processing, Requires verbal cues, Requires tactile cues, Decreased initiation, Difficulty sequencing General Comments: increased difficulty following  commands as pt fatigued        Exercises      General Comments        Pertinent Vitals/Pain Pain Assessment Pain Assessment: Faces Faces Pain Scale: Hurts a little bit Pain Location: L great toe to touch Pain Descriptors / Indicators: Sore Pain Intervention(s): Limited activity within patient's tolerance, Monitored during session    Home Living                          Prior Function            PT Goals (current goals can now be found in the care plan section) Progress towards PT goals: Progressing toward goals    Frequency    Min 1X/week      PT Plan      Co-evaluation PT/OT/SLP Co-Evaluation/Treatment: Yes Reason for Co-Treatment: For patient/therapist safety;To address functional/ADL transfers PT goals addressed during session: Mobility/safety with mobility OT goals addressed during session: ADL's and self-care      AM-PAC PT "6 Clicks" Mobility   Outcome Measure  Help needed turning from your back to your side while in a flat bed without using bedrails?: A Lot Help needed moving from lying on your back to sitting on the side of a flat bed without using bedrails?: A Lot Help needed moving to and from a bed to a chair (including a wheelchair)?: A Lot Help needed standing up from a chair using your arms (e.g., wheelchair or bedside chair)?: A Lot Help needed to walk in hospital room?: A Lot Help needed climbing 3-5 steps with a railing? : Total 6 Click Score: 11    End of Session Equipment Utilized During Treatment: Gait belt Activity Tolerance: Patient limited by fatigue Patient left: with call bell/phone within reach;in chair;with chair alarm set Nurse Communication: Mobility status PT Visit Diagnosis: Unsteadiness on feet (R26.81);Other abnormalities of gait and mobility (R26.89);Repeated falls (R29.6);Muscle weakness (generalized) (M62.81);Hemiplegia and hemiparesis Hemiplegia - Right/Left: Left Hemiplegia - caused by: Cerebral  infarction     Time: 0950-1006 PT Time Calculation (min) (ACUTE ONLY): 16 min  Charges:    $Therapeutic Activity: 8-22 mins PT General Charges $$ ACUTE PT VISIT: 1 Visit                   Danielle Dess, PTA 03/15/23, 11:16 AM

## 2023-03-15 NOTE — Plan of Care (Signed)

## 2023-03-15 NOTE — Progress Notes (Signed)
PROGRESS NOTE    BRISTOL SOY  ZOX:[id number] DOB: 18-May-1930 DOA: 03/11/2023 PCP: Barbette Reichmann, MD    Brief Narrative:   89 y.o. male with medical history significant of hypertension, hyperlipidemia, GERD, recent CVA 3 weeks ago treated at [facility] was brought to the ED due to generalized weakness and dizziness being off balance x 1 day.  Patient came home from [facility] rehab 7 days ago after multifocal small vessel infarcts.  Was doing well at home.  Ambulating on his own with good functional status, able to perform basic ADLs.   1 night before presentation to [facility] family states the patient did not want to eat dinner and started complaining of dizziness.  The morning of presentation patient was having trouble with coordination unable to raise himself out of bed.  No evidence of trauma.   On presentation to the ED the patient is hemodynamically stable.  MRI reveals new acute CVA.  On my interview the patient is resting comfortably in bed.  Son-in-law at bedside.  Primary caregiver.  States that the patient was discharged on dual antiplatelet therapy aspirin and Plavix and has been compliant with all medications.  11/1: Patient seen in consultation by neurology who ordered CTA to evaluate carotid anatomy.  Pending CTA results and possible vascular surgery consult.  Pending PT and OT evaluations.  11/2: Case discussed at length with neurology.  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  Vascular surgery engaged for comment consideration of carotid endarterectomy  11/3: Pending vascular surgery evaluation and recommendations    Assessment & Plan:   Principal Problem:   Acute CVA (cerebrovascular accident) [facility])  Acute CVA Patient presents 3 weeks after previous hospitalization and discharged from [facility].  Now with evidence of new acute CVA on imaging.  Patient son-in-law at bedside reports compliance with  dual antiplatelet therapy and antihyperlipidemic therapy at home. Seen in consultation by neurology Concerned that recurrent strokes within the same vascular territory are secondary to calcium emboli Plan: Continue DAPT Continue statin, lipid profile at goal Continue PT/OT/mobilization efforts Diet advanced per SLP recommendations Vascular consult.  Dr. Lenell Antu engaged.  Consideration for carotid endarterectomy TOC consult for placement.  Will need skilled nursing facility    Essential hypertension Blood pressure controlled Normotensive blood pressure goals   Hyperlipidemia PTA statin.  Lipid profile at goal   GERD PPI    DVT prophylaxis: SQ Lovenox Code Status: DNR Family Communication: None today Disposition Plan: Status is: Observation The patient will require care spanning > 2 midnights and should be moved to inpatient because: Acute CVA.  Concern for calcium emboli within the PCA distribution.  Vascular surgery engaged.  Pending official consult and recommendations.   Level of care: Telemetry Medical  Consultants:  Neurology  Procedures:  None  Antimicrobials: None   Subjective: Seen and examined.  Sitting up in chair.  No distress.  Objective: Vitals:   03/14/23 1610 03/14/23 1610 03/14/23 2019 03/15/23 0556  BP: 122/84 122/84 118/84 (!) 140/83  Pulse: 70 70 71 (!) 58  Resp: 16 16 18 18   Temp: 98.4 F (36.9 C) 98.4 F (36.9 C) 98.2 F (36.8 C) 97.7 F (36.5 C)  TempSrc:      SpO2: 92% 92% 95% 95%  Height:        Intake/Output Summary (Last 24 hours) at 03/15/2023 1133 Last data filed at 03/15/2023 0649 Gross per 24 hour  Intake 400 ml  Output 650 ml  Net -250 ml   There were no vitals filed for this visit.  Examination:  General exam No acute distress Respiratory system: Lungs clear.  Normal work of breathing.  Room air Cardiovascular system: S1-2, RRR, no murmurs, no pedal edema Gastrointestinal system: Soft, NT/ND, normal bowel  sounds Central nervous system: Alert.  Oriented x 2.  Upper left extremity weakness noted. Extremities: Symmetric 5 x 5 power.  Gait not assessed Skin: No rashes, lesions or ulcers Psychiatry: Judgement and insight appear normal. Mood & affect appropriate.     Data Reviewed: I have personally reviewed following labs and imaging studies  CBC: Recent Labs  Lab 03/11/23 1202  WBC 5.8  NEUTROABS 3.8  HGB 14.2  HCT 43.2  MCV 92.3  PLT 206   Basic Metabolic Panel: Recent Labs  Lab 03/11/23 1202  NA 140  K 4.0  CL 104  CO2 27  GLUCOSE 94  BUN 12  CREATININE 0.94  CALCIUM 9.2   GFR: CrCl cannot be calculated (Unknown ideal weight.). Liver Function Tests: Recent Labs  Lab 03/11/23 1202  AST 21  ALT 18  ALKPHOS 98  BILITOT 0.7  PROT 6.8  ALBUMIN 3.7   No results for input(s): "LIPASE", "AMYLASE" in the last 168 hours. No results for input(s): "AMMONIA" in the last 168 hours. Coagulation Profile: Recent Labs  Lab 03/11/23 1359  INR 1.1   Cardiac Enzymes: No results for input(s): "CKTOTAL", "CKMB", "CKMBINDEX", "TROPONINI" in the last 168 hours. BNP (last 3 results) No results for input(s): "PROBNP" in the last 8760 hours. HbA1C: No results for input(s): "HGBA1C" in the last 72 hours. CBG: Recent Labs  Lab 03/11/23 1149  GLUCAP 103*   Lipid Profile: No results for input(s): "CHOL", "HDL", "LDLCALC", "TRIG", "CHOLHDL", "LDLDIRECT" in the last 72 hours.  Thyroid Function Tests: No results for input(s): "TSH", "T4TOTAL", "FREET4", "T3FREE", "THYROIDAB" in the last 72 hours. Anemia Panel: No results for input(s): "VITAMINB12", "FOLATE", "FERRITIN", "TIBC", "IRON", "RETICCTPCT" in the last 72 hours. Sepsis Labs: No results for input(s): "PROCALCITON", "LATICACIDVEN" in the last 168 hours.  Recent Results (from the past 240 hour(s))  SARS Coronavirus 2 by RT PCR (hospital order, performed in [facility] hospital lab) *cepheid single result test* Anterior  Nasal Swab     Status: None   Collection Time: 03/11/23  1:59 PM   Specimen: Anterior Nasal Swab  Result Value Ref Range Status   SARS Coronavirus 2 by RT PCR NEGATIVE NEGATIVE Final    Comment: (NOTE) SARS-CoV-2 target nucleic acids are NOT DETECTED.  The SARS-CoV-2 RNA is generally detectable in upper and lower respiratory specimens during the acute phase of infection. The lowest concentration of SARS-CoV-2 viral copies this assay can detect is 250 copies / mL. A negative result does not preclude SARS-CoV-2 infection and should not be used as the sole basis for treatment or other patient management decisions.  A negative result may occur with improper specimen collection / handling, submission of specimen other than nasopharyngeal swab, presence of viral mutation(s) within the areas targeted by this assay, and inadequate number of viral copies (<250 copies / mL). A negative result must be combined with clinical observations, patient history, and epidemiological information.  Fact Sheet for Patients:   RoadLapTop.co.za  Fact Sheet for Healthcare Providers: http://kim-miller.com/  This test is not yet approved or  cleared by the [place] FDA and has been authorized for detection and/or diagnosis of SARS-CoV-2 by FDA under an Emergency Use Authorization (  EUA).  This EUA will remain in effect (meaning this test can be used) for the duration of the COVID-19 declaration under Section 564(b)(1) of the Act, 21 U.S.C. section 360bbb-3(b)(1), unless the authorization is terminated or revoked sooner.  Performed at [facility], [address], [place], [place] [address]          Radiology Studies: No results found.      Scheduled Meds:  aspirin EC  81 mg Oral Daily   bisacodyl  10 mg Oral QHS   clopidogrel  75 mg Oral Daily   enoxaparin (LOVENOX) injection  40 mg Subcutaneous Q24H   melatonin  2.5 mg Oral QHS    pantoprazole  40 mg Oral Daily   polyethylene glycol  17 g Oral Daily   pravastatin  10 mg Oral q1800   Continuous Infusions:   LOS: 0 days     Tresa Moore, MD [facility]   If 7PM-7AM, please contact night-coverage  03/15/2023, 11:33 AM

## 2023-03-15 NOTE — Care Management Obs Status (Signed)
MEDICARE OBSERVATION STATUS NOTIFICATION   Patient Details  Name: MADOC HOLQUIN MRN: [id number] Date of Birth: 1930/12/21   Medicare Observation Status Notification Given:  Yes    Aubria Vanecek, LCSW 03/15/2023, 9:01 AM

## 2023-03-15 NOTE — Progress Notes (Signed)
Occupational Therapy Treatment Patient Details Name: Thomas [name] MRN: [id number] DOB: Oct 04, 1930 Today's Date: 03/15/2023   History of present illness Pt is an 89 yo male with incoordination, trouble getting out of bed, and weakness the morning of presentation to the ED. MRI reveals new acute CVA. Per MD note, pt had a "recent CVA 3 weeks ago treated at [facility] was brought to the ED due to generalized weakness and dizziness being off balance x 1 day. Patient came home from [facility] rehab 7 days ago after multifocal small vessel infarcts. 1 night before presentation to [facility] family states the patient did not want to eat dinner and started complaining of dizziness." Other PMH includes HTN, hyperlipidemia, GERD, and B/L carpal tunnel.   OT comments  Thomas [name] was seen for OT/PT co-treatment on this date. Upon arrival to room pt in bed, agreeable to tx. Pt requires MIN-MOD A + RW sit<>stand x3 trials. MOD A x2 + RW bed>chair step pivot t/f, cues to sequence, assist for RW advancement and to offload L side for LLE advancement. MAX A don B socks seated EOB. SETUP self-feeding, MIN cues for aspiration precautions. Pt making good progress toward goals, will continue to follow POC. Discharge recommendation remains appropriate.        If plan is discharge home, recommend the following:  A lot of help with walking and/or transfers;A lot of help with bathing/dressing/bathroom;Supervision due to cognitive status;Direct supervision/assist for financial management;Assistance with cooking/housework;Assist for transportation;Help with stairs or ramp for entrance;Direct supervision/assist for medications management   Equipment Recommendations  Other (comment) (defer)    Recommendations for Other Services      Precautions / Restrictions Precautions Precautions: Fall Restrictions Weight Bearing Restrictions: No       Mobility Bed Mobility Overal bed mobility: Needs Assistance Bed Mobility: Supine to Sit      Supine to sit: Mod assist, +2 for physical assistance          Transfers Overall transfer level: Needs assistance Equipment used: Rolling walker (2 wheels) Transfers: Sit to/from Stand, Bed to chair/wheelchair/BSC Sit to Stand: Mod assist, Min assist, +2 physical assistance     Step pivot transfers: Mod assist, +2 physical assistance     General transfer comment: cues to sequence, assist for RW advancement and to offload L side for LLE advancement     Balance Overall balance assessment: Needs assistance Sitting-balance support: Feet supported, No upper extremity supported Sitting balance-[name]: Fair     Standing balance support: Bilateral upper extremity supported, Reliant on assistive device for balance, During functional activity Standing balance-[name]: Poor                             ADL either performed or assessed with clinical judgement   ADL Overall ADL's : Needs assistance/impaired                                       General ADL Comments: MOD A x2 + RW for ADL t/f, cues to sequence steps. MAX A don B socks seated EOB. SETUP self-feeding, MIN cues for aspiration precautions     Vision   Additional Comments: HoH limiting assessment however pt locates all items on meal tray          Cognition Arousal: Alert Behavior During Therapy: WFL for tasks assessed/performed Overall Cognitive Status: No family/caregiver  present to determine baseline cognitive functioning Area of Impairment: Following commands, Safety/judgement, Problem solving                       Following Commands: Follows one step commands with increased time Safety/Judgement: Decreased awareness of safety, Decreased awareness of deficits   Problem Solving: Slow processing, Requires verbal cues, Requires tactile cues, Decreased initiation, Difficulty sequencing General Comments: increased difficulty following commands as pt fatigued                    Pertinent Vitals/ Pain       Pain Assessment Pain Assessment: Faces Faces Pain Scale: Hurts a little bit Pain Location: L great toe to touch Pain Descriptors / Indicators: Sore Pain Intervention(s): Limited activity within patient's tolerance, Repositioned   Frequency  Min 1X/week        Progress Toward Goals  OT Goals(current goals can now be found in the care plan section)  Progress towards OT goals: Progressing toward goals  Acute Rehab OT Goals Patient Stated Goal: to return to PLOF OT Goal Formulation: With patient Time For Goal Achievement: 03/26/23 Potential to Achieve Goals: Good ADL Goals Pt Will Perform Grooming: standing;with min assist Pt Will Perform Lower Body Dressing: sit to/from stand;with min assist Pt Will Transfer to Toilet: with min assist;bedside commode;ambulating Pt Will Perform Toileting - Clothing Manipulation and hygiene: with min assist;sit to/from stand  Plan      Co-evaluation    PT/OT/SLP Co-Evaluation/Treatment: Yes Reason for Co-Treatment: For patient/therapist safety;To address functional/ADL transfers PT goals addressed during session: Mobility/safety with mobility OT goals addressed during session: ADL's and self-care      AM-PAC OT "6 Clicks" Daily Activity     Outcome Measure   Help from another person eating meals?: A Little Help from another person taking care of personal grooming?: A Little Help from another person toileting, which includes using toliet, bedpan, or urinal?: A Lot Help from another person bathing (including washing, rinsing, drying)?: A Lot Help from another person to put on and taking off regular upper body clothing?: A Little Help from another person to put on and taking off regular lower body clothing?: A Lot 6 Click Score: 15    End of Session Equipment Utilized During Treatment: Gait belt;Rolling walker (2 wheels)  OT Visit Diagnosis: Unsteadiness on feet (R26.81);Other abnormalities of  gait and mobility (R26.89);Repeated falls (R29.6);Muscle weakness (generalized) (M62.81);History of falling (Z91.81)   Activity Tolerance Patient tolerated treatment well   Patient Left in chair;with call bell/phone within reach;with chair alarm set   Nurse Communication          Time: [phone] OT Time Calculation (min): 24 min  Charges: OT General Charges $OT Visit: 1 Visit OT Treatments $Self Care/Home Management : 8-22 mins  Kathie Dike, M.S. OTR/L  03/15/23, 10:49 AM  ascom [phone]

## 2023-03-15 NOTE — Progress Notes (Signed)
[facility]  Daily Progress Note   Subjective  -   Patient sitting up in a chair eating dinner.  His daughter is present.  Patient feels he still has some weakness he worked with physical therapy and is having some difficulty with walking  Objective Vitals:   03/14/23 2019 03/15/23 0556 03/15/23 1811 03/15/23 2001  BP: 118/84 (!) 140/83 121/75 123/88  Pulse: 71 (!) 58 78 64  Resp: 18 18 18 17   Temp: 98.2 F (36.8 C) 97.7 F (36.5 C) 98.1 F (36.7 C) 98.2 F (36.8 C)  TempSrc:      SpO2: 95% 95% 94% 95%  Height:        Intake/Output Summary (Last 24 hours) at 03/15/2023 2058 Last data filed at 03/15/2023 3329 Gross per 24 hour  Intake --  Output 400 ml  Net -400 ml    PULM  Normal effort , no use of accessory muscles CV  No JVD, RRR Abd      No distended, nontender Neuro no evidence for aphasia.  No facial droop.  Upper and lower extremities demonstrate good strength and motion.  Laboratory CBC    Component Value Date/Time   WBC 4.3 03/15/2023 1128   HGB 13.7 03/15/2023 1128   HGB 10.6 (L) 10/28/2011 0622   HCT 41.6 03/15/2023 1128   HCT 40.5 10/19/2011 0819   PLT 206 03/15/2023 1128   PLT 161 10/28/2011 0622    BMET    Component Value Date/Time   NA 138 03/15/2023 1128   NA 140 10/28/2011 0622   K 3.6 03/15/2023 1128   K 4.1 10/28/2011 0622   CL 106 03/15/2023 1128   CL 108 (H) 10/28/2011 0622   CO2 25 03/15/2023 1128   CO2 24 10/28/2011 0622   GLUCOSE 143 (H) 03/15/2023 1128   GLUCOSE 81 10/28/2011 0622   BUN 16 03/15/2023 1128   BUN 17 10/28/2011 0622   CREATININE 1.02 03/15/2023 1128   CREATININE 1.12 10/28/2011 0622   CALCIUM 9.1 03/15/2023 1128   CALCIUM 7.9 (L) 10/28/2011 0622   GFRNONAA >60 03/15/2023 1128   GFRNONAA >60 10/28/2011 0622   GFRAA >60 10/28/2011 0622    Assessment/Planning: Carotid stenosis: Recommend:  The patient is symptomatic with respect to the right internal carotid artery stenosis.  I  have personally reviewed the CT angiogram and believe the patient has now progressed and has a lesion the is >75%.  Patient's CT angiography of the carotid arteries confirms >75% right ICA stenosis.  The anatomical considerations support surgery over stenting.  This was discussed in detail with the patient.  The patient does indeed need surgery, therefore, cardiac clearance will be arranged. Once cleared the patient will be scheduled for surgery.  I have completed Shared Decision-Making with Madden Garron Lewisprior to carotid surgery/intervention. The conversation included: -Discussion of all treatment options including carotid endarterectomy (CEA), carotid artery stenting (which includes transcarotid artery revascularization (TCAR)), and optimal medical therapy (OMT). -Explanation of risks and benefits for each option specific to Antionette Char Claw's clinical situation. -Integration of clinical guidelines as it relates to the patient's history and comorbidities. -Discussion and incorporation of [name] and their personal preferences and priorities in choosing a treatment plan plan  If the patient was unable to participate in Shared Decision Making this process was done with both the patient and his daughter.  The patient's NIHSS score is as follows: 4 Mild: 1 - 5 Mild to Moderately Severe: 5 - 14  Severe: 15 - 24 Very Severe: >25  The risks, benefits and alternative therapies were reviewed in detail with the patient.  All questions were answered.  The patient agrees to proceed with surgery of the right carotid artery.  Continue antiplatelet therapy as prescribed. Continue management of CAD, HTN and Hyperlipidemia. Healthy heart diet, encouraged exercise at least 4 times per week.   2.  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  Given this the thalamic distribution is likely dependent on the carotid flow and not the posterior circulation.  This supports treating his right carotid stenosis.  Otherwise, he will continue his dual antiplatelet therapy and high-dose statin therapy.  3.  Hypertension: Continue antihypertensive medications as already ordered, these medications have been reviewed and there are no changes at this time.    4.  Hyperlipidemia: Continue statin as ordered and reviewed, no changes at this time   Levora Dredge  03/15/2023, 8:58 PM

## 2023-03-16 DIAGNOSIS — I6521 Occlusion and stenosis of right carotid artery: Secondary | ICD-10-CM | POA: Diagnosis not present

## 2023-03-16 DIAGNOSIS — I671 Cerebral aneurysm, nonruptured: Secondary | ICD-10-CM | POA: Diagnosis present

## 2023-03-16 DIAGNOSIS — R310 Gross hematuria: Secondary | ICD-10-CM | POA: Diagnosis not present

## 2023-03-16 DIAGNOSIS — I2510 Atherosclerotic heart disease of native coronary artery without angina pectoris: Secondary | ICD-10-CM | POA: Diagnosis present

## 2023-03-16 DIAGNOSIS — I6359 Cerebral infarction due to unspecified occlusion or stenosis of other cerebral artery: Secondary | ICD-10-CM | POA: Diagnosis present

## 2023-03-16 DIAGNOSIS — K810 Acute cholecystitis: Secondary | ICD-10-CM | POA: Diagnosis not present

## 2023-03-16 DIAGNOSIS — T8383XA Hemorrhage of genitourinary prosthetic devices, implants and grafts, initial encounter: Secondary | ICD-10-CM | POA: Diagnosis not present

## 2023-03-16 DIAGNOSIS — Z8249 Family history of ischemic heart disease and other diseases of the circulatory system: Secondary | ICD-10-CM | POA: Diagnosis not present

## 2023-03-16 DIAGNOSIS — E785 Hyperlipidemia, unspecified: Secondary | ICD-10-CM | POA: Diagnosis present

## 2023-03-16 DIAGNOSIS — R4182 Altered mental status, unspecified: Secondary | ICD-10-CM | POA: Diagnosis not present

## 2023-03-16 DIAGNOSIS — Y658 Other specified misadventures during surgical and medical care: Secondary | ICD-10-CM | POA: Diagnosis not present

## 2023-03-16 DIAGNOSIS — E876 Hypokalemia: Secondary | ICD-10-CM | POA: Diagnosis not present

## 2023-03-16 DIAGNOSIS — R10811 Right upper quadrant abdominal tenderness: Secondary | ICD-10-CM | POA: Diagnosis not present

## 2023-03-16 DIAGNOSIS — A419 Sepsis, unspecified organism: Secondary | ICD-10-CM | POA: Diagnosis not present

## 2023-03-16 DIAGNOSIS — I6523 Occlusion and stenosis of bilateral carotid arteries: Secondary | ICD-10-CM | POA: Diagnosis present

## 2023-03-16 DIAGNOSIS — Z1152 Encounter for screening for COVID-19: Secondary | ICD-10-CM | POA: Diagnosis not present

## 2023-03-16 DIAGNOSIS — F05 Delirium due to known physiological condition: Secondary | ICD-10-CM | POA: Diagnosis not present

## 2023-03-16 DIAGNOSIS — Z66 Do not resuscitate: Secondary | ICD-10-CM | POA: Diagnosis present

## 2023-03-16 DIAGNOSIS — K8000 Calculus of gallbladder with acute cholecystitis without obstruction: Secondary | ICD-10-CM | POA: Diagnosis not present

## 2023-03-16 DIAGNOSIS — R531 Weakness: Secondary | ICD-10-CM | POA: Diagnosis not present

## 2023-03-16 DIAGNOSIS — G928 Other toxic encephalopathy: Secondary | ICD-10-CM | POA: Diagnosis not present

## 2023-03-16 DIAGNOSIS — N179 Acute kidney failure, unspecified: Secondary | ICD-10-CM | POA: Diagnosis not present

## 2023-03-16 DIAGNOSIS — D649 Anemia, unspecified: Secondary | ICD-10-CM | POA: Diagnosis present

## 2023-03-16 DIAGNOSIS — E860 Dehydration: Secondary | ICD-10-CM | POA: Diagnosis not present

## 2023-03-16 DIAGNOSIS — Z79899 Other long term (current) drug therapy: Secondary | ICD-10-CM | POA: Diagnosis not present

## 2023-03-16 DIAGNOSIS — R338 Other retention of urine: Secondary | ICD-10-CM | POA: Diagnosis not present

## 2023-03-16 DIAGNOSIS — I639 Cerebral infarction, unspecified: Secondary | ICD-10-CM | POA: Diagnosis present

## 2023-03-16 DIAGNOSIS — I10 Essential (primary) hypertension: Secondary | ICD-10-CM | POA: Diagnosis present

## 2023-03-16 DIAGNOSIS — N401 Enlarged prostate with lower urinary tract symptoms: Secondary | ICD-10-CM | POA: Diagnosis present

## 2023-03-16 DIAGNOSIS — Z96653 Presence of artificial knee joint, bilateral: Secondary | ICD-10-CM | POA: Diagnosis present

## 2023-03-16 DIAGNOSIS — I771 Stricture of artery: Secondary | ICD-10-CM | POA: Diagnosis present

## 2023-03-16 DIAGNOSIS — K219 Gastro-esophageal reflux disease without esophagitis: Secondary | ICD-10-CM | POA: Diagnosis present

## 2023-03-16 NOTE — Plan of Care (Signed)
  Problem: Education: Goal: Knowledge of disease or condition will improve Outcome: Progressing Goal: Knowledge of secondary prevention will improve (MUST DOCUMENT ALL) Outcome: Progressing Goal: Knowledge of patient specific risk factors will improve Loraine Leriche N/A or DELETE if not current risk factor) Outcome: Progressing   Problem: Coping: Goal: Will verbalize positive feelings about self Outcome: Progressing Goal: Will identify appropriate support needs Outcome: Progressing   Problem: Health Behavior/Discharge Planning: Goal: Ability to manage health-related needs will improve Outcome: Progressing   Problem: Self-Care: Goal: Ability to participate in self-care as condition permits will improve Outcome: Progressing Goal: Verbalization of feelings and concerns over difficulty with self-care will improve Outcome: Progressing   Problem: Nutrition: Goal: Risk of aspiration will decrease Outcome: Progressing   Problem: Education: Goal: Knowledge of General Education information will improve Description: Including pain rating scale, medication(s)/side effects and non-pharmacologic comfort measures Outcome: Progressing   Problem: Clinical Measurements: Goal: Will remain free from infection Outcome: Progressing Goal: Diagnostic test results will improve Outcome: Progressing Goal: Respiratory complications will improve Outcome: Progressing   Problem: Coping: Goal: Level of anxiety will decrease Outcome: Progressing   Problem: Pain Management: Goal: General experience of comfort will improve Outcome: Progressing

## 2023-03-16 NOTE — Consult Note (Signed)
[facility] CLINIC CARDIOLOGY CONSULT NOTE       Patient ID: Thomas [name] MRN: [id number] DOB/AGE: 12-06-1930 89 y.o.  Admit date: 03/11/2023 Referring Physician Dr. Vilinda Flake Primary Physician Barbette Reichmann, MD  Primary Cardiologist none Reason for Consultation POC  HPI: Thomas [name] is a 89 y.o. male  with a past medical history of hypertension, hyperlipidemia, GERD, recent CVA 3 weeks ago treated at [facility] who presented to the ED on 03/11/2023 for weakness and dizziness. New CVA this admission, concern for significant R carotid stenosis, vascular is planning for R CEA. Cardiology was consulted for preoperative evaluation.   Patient reports that he was hospitalized last month for stroke. Had been recovering well without any significant issues. Had noticed some functional limitations after CVA 10/11. On the morning of 10/31 while reaching for a blanket in a cabinet he noticed that his L arm felt weak. He reports generalized weakness as well on this date and overall did not feel like himself. He was brought to the ED by his daughter and son-in-law for further evaluation. Found to have acute CVA on MRI brain. CTA head neck with progressive R ICA stenosis per vascular surgery, feel this lesion is contributing to recurrent CVA. They are planning to take the patient for R CEA later this week. Patient needed cardiac evaluation prior to proceeding with surgery.   At the time of my evaluation this morning he is resting comfortably in hospital bed with no family present at bedside. He reports that overall he feels better now. States that weakness has improved. He denies any headache, visual changes, or dizziness. States that he lives at home, his daughter and son-in-law recently moved in to help care for the patient and his wife. His family helps transport him to appointments, he does not drive. Otherwise he states he is able to do the things he wishes around his house, feels he has some functional  limitations since his stroke last month. Denies any exertional symptoms, chest pain, shortness of breath, or palpitations. He denies any prior cardiac issues, reports he has never experienced chest pain or other anginal symptoms.   Review of systems complete and found to be negative unless listed above    Past Medical History:  Diagnosis Date   Arthritis    Carpal tunnel syndrome on both sides    GERD (gastroesophageal reflux disease)    Hyperlipidemia    Hypertension     Past Surgical History:  Procedure Laterality Date   INGUINAL HERNIA REPAIR  2001   REPLACEMENT TOTAL KNEE Bilateral     Medications Prior to Admission  Medication Sig Dispense Refill Last Dose   acetaminophen (TYLENOL) 325 MG tablet Take 2 tablets (650 mg total) by mouth every 6 (six) hours as needed for fever, headache or moderate pain.   prn at unk   bisacodyl (DULCOLAX) 10 MG suppository Place 1 suppository (10 mg total) rectally daily as needed for severe constipation.   prn at unk   bisacodyl (DULCOLAX) 5 MG EC tablet Take 2 tablets (10 mg total) by mouth at bedtime.   prn at unk   pravastatin (PRAVACHOL) 40 MG tablet Take 1 tablet by mouth daily.      sertraline (ZOLOFT) 25 MG tablet Take 1 tablet by mouth daily.      aspirin EC 81 MG tablet Take by mouth. (Patient not taking: Reported on 05/20/2021)      clopidogrel (PLAVIX) 75 MG tablet Take 1 tablet (75 mg total) by mouth  daily. Total 90 days from Sep 16 to Dec 14th as per Neurologist      cyanocobalamin (VITAMIN B12) 1000 MCG tablet Take 1,000 mcg by mouth daily.      lovastatin (MEVACOR) 40 MG tablet Take by mouth.      pantoprazole (PROTONIX) 40 MG tablet Take 1 tablet (40 mg total) by mouth daily.      polyethylene glycol (MIRALAX / GLYCOLAX) 17 g packet Take 17 g by mouth daily.      senna (SENOKOT) 8.6 MG TABS tablet Take 1 tablet (8.6 mg total) by mouth at bedtime as needed for mild constipation. 120 tablet 0    Social History   Socioeconomic  History   Marital status: Widowed    Spouse name: Not on file   Number of children: Not on file   Years of education: Not on file   Highest education level: Not on file  Occupational History   Not on file  Tobacco Use   Smoking status: Never   Smokeless tobacco: Never  Vaping Use   Vaping status: Never Used  Substance and Sexual Activity   Alcohol use: Never   Drug use: Never   Sexual activity: Not on file  Other Topics Concern   Not on file  Social History Narrative   Not on file   Social Determinants of Health   Financial Resource Strain: Low Risk  (10/12/2022)   Received from [facility]   Overall Financial Resource Strain (CARDIA)    Difficulty of Paying Living Expenses: Not hard at all  Food Insecurity: No Food Insecurity (03/12/2023)   Hunger Vital Sign    Worried About Running Out of Food in the Last Year: Never true    Ran Out of Food in the Last Year: Never true  Transportation Needs: No Transportation Needs (03/12/2023)   PRAPARE - Administrator, Civil Service (Medical): No    Lack of Transportation (Non-Medical): No  Physical Activity: Not on file  Stress: Not on file  Social Connections: Not on file  Intimate Partner Violence: Not At Risk (03/12/2023)   Humiliation, Afraid, Rape, and Kick questionnaire    Fear of Current or Ex-Partner: No    Emotionally Abused: No    Physically Abused: No    Sexually Abused: No    Family History  Problem Relation Age of Onset   Cancer Sister        Liver cancer   Melanoma Sister    Cancer Brother        Pancreatic   Cancer Brother        Lung cancer   Heart disease Brother      Vitals:   03/15/23 0556 03/15/23 1811 03/15/23 2001 03/16/23 0441  BP: (!) 140/83 121/75 123/88 139/89  Pulse: (!) 58 78 64 72  Resp: 18 18 17 17   Temp: 97.7 F (36.5 C) 98.1 F (36.7 C) 98.2 F (36.8 C) 98.3 F (36.8 C)  TempSrc:    Oral  SpO2: 95% 94% 95% 96%  Height:        PHYSICAL EXAM General:  Well appearing elderly male, well nourished, in no acute distress sitting upright in hospital bed. HEENT: Normocephalic and atraumatic. Neck: No JVD.  Lungs: Normal respiratory effort on room air. Clear bilaterally to auscultation. No wheezes, crackles, rhonchi.  Heart: HRRR. Normal S1 and S2 without gallops or murmurs.  Abdomen: Non-distended appearing.  Msk: Normal strength and tone for age. Extremities: Warm and well  perfused. No clubbing, cyanosis. No edema.  Neuro: Alert and oriented X 3. Psych: Answers questions appropriately.   Labs: Basic Metabolic Panel: Recent Labs    03/15/23 1128  NA 138  K 3.6  CL 106  CO2 25  GLUCOSE 143*  BUN 16  CREATININE 1.02  CALCIUM 9.1   Liver Function Tests: No results for input(s): "AST", "ALT", "ALKPHOS", "BILITOT", "PROT", "ALBUMIN" in the last 72 hours. No results for input(s): "LIPASE", "AMYLASE" in the last 72 hours. CBC: Recent Labs    03/15/23 1128  WBC 4.3  NEUTROABS 2.6  HGB 13.7  HCT 41.6  MCV 91.2  PLT 206   Cardiac Enzymes: No results for input(s): "CKTOTAL", "CKMB", "CKMBINDEX", "TROPONINIHS" in the last 72 hours. BNP: No results for input(s): "BNP" in the last 72 hours. D-Dimer: No results for input(s): "DDIMER" in the last 72 hours. Hemoglobin A1C: No results for input(s): "HGBA1C" in the last 72 hours. Fasting Lipid Panel: No results for input(s): "CHOL", "HDL", "LDLCALC", "TRIG", "CHOLHDL", "LDLDIRECT" in the last 72 hours. Thyroid Function Tests: No results for input(s): "TSH", "T4TOTAL", "T3FREE", "THYROIDAB" in the last 72 hours.  Invalid input(s): "FREET3" Anemia Panel: No results for input(s): "VITAMINB12", "FOLATE", "FERRITIN", "TIBC", "IRON", "RETICCTPCT" in the last 72 hours.   Radiology: CT ANGIO HEAD NECK W WO CM  Result Date: 03/12/2023 CLINICAL DATA:  Stroke/TIA, determine embolic source. EXAM: CT ANGIOGRAPHY HEAD AND NECK WITH AND WITHOUT CONTRAST TECHNIQUE: Multidetector CT imaging of the  head and neck was performed using the standard protocol during bolus administration of intravenous contrast. Multiplanar CT image reconstructions and MIPs were obtained to evaluate the vascular anatomy. Carotid stenosis measurements (when applicable) are obtained utilizing NASCET criteria, using the distal internal carotid diameter as the denominator. RADIATION DOSE REDUCTION: This exam was performed according to the departmental dose-optimization program which includes automated exposure control, adjustment of the mA and/or kV according to patient size and/or use of iterative reconstruction technique. CONTRAST:  75mL OMNIPAQUE IOHEXOL 350 MG/ML SOLN COMPARISON:  Brain MRI 1 day prior, CTA head/neck 02/12/2023 FINDINGS: CT HEAD FINDINGS Brain: The known infarct in the right thalamus is again seen, better seen on prior MRI. There is no evidence of new acute infarct. There is no acute intracranial hemorrhage or extra-axial fluid collection. Parenchymal volume loss with prominence of the ventricular system and extra-axial CSF spaces is stable. The ventricles are stable in size. Advanced background chronic small-vessel ischemic change is stable. Multiple small remote infarcts are again noted in the bilateral basal ganglia, thalami, and cerebellar hemispheres. The pituitary and suprasellar region are normal. There is no mass lesion. There is no mass effect or midline shift. Vascular: See below. Skull: Normal. Negative for fracture or focal lesion. Sinuses/Orbits: The paranasal sinuses are clear. Bilateral lens implants are in place. The globes and orbits are otherwise unremarkable. Other: The mastoid air cells and middle ear cavities are clear. Review of the MIP images confirms the above findings CTA NECK FINDINGS Aortic arch: There is calcified plaque in the imaged aortic arch. The origins of the major branch vessels are patent. The subclavian arteries are patent to the level imaged. Right carotid system: The right  common carotid artery is patent. There is mixed plaque at the bifurcation resulting in approximately 50% stenosis of the proximal internal carotid artery, not significantly changed. The distal internal carotid artery is widely patent. The external carotid artery is patent. There is no evidence of dissection or aneurysm. Left carotid system: The left common carotid  artery is patent. There is calcified plaque at the bifurcation suspected to result in approximately 50% stenosis of the proximal internal carotid artery, though measurement is made difficult by significant blooming artifact from the calcium. Findings are unchanged. The external carotid artery is patent. There is no evidence of dissection or aneurysm. Vertebral arteries: The vertebral arteries are patent, without hemodynamically significant stenosis or occlusion. There is no evidence of dissection or aneurysm. Skeleton: There is no acute osseous abnormality or suspicious osseous lesion. There is advanced multilevel degenerative change throughout the cervical spine. There is no visible canal hematoma or high-grade spinal canal stenosis. Other neck: A 3.7 cm x 4.8 cm x 4.2 cm pathologic lymph node in the right neck is not significantly changed, present since 2019. Upper chest: The imaged lung apices are clear. Review of the MIP images confirms the above findings CTA HEAD FINDINGS Anterior circulation: There is calcified plaque in the intracranial ICAs without significant stenosis or occlusion The bilateral MCAs are patent, without proximal stenosis or occlusion. The left A1 segment is hypoplastic, a normal variant. The ACAS are otherwise patent, without proximal stenosis or occlusion. 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 Posterior circulation: The bilateral V4 segments are patent with mild stenosis on the left. The basilar artery is patent but diminutive  distally. The major cerebellar arteries appear patent. The PCAs are primarily fed by posterior communicating arteries bilaterally (fetal origins). There is new focal calcification in the proximal right P2 segment resulting in severe stenosis (11-99, 10-124). There is focal severe stenosis of the right P2/P3 segment (10-110), unchanged. There is multifocal severe stenosis of the left PCA at the proximal P2 segment (11-93), more distal P2 segment (11-102), and P3 segment (11-140). There is no aneurysm or AVM. Venous sinuses: Not well evaluated due to bolus timing. Anatomic variants: As above. Review of the MIP images confirms the above findings IMPRESSION: 1. The known right thalamic infarct is again seen, better evaluated on MRI from 1 day prior. No new acute intracranial pathology. 2. New focal calcification in the proximal right P2 segment resulting in severe stenosis. Additional multifocal severe stenosis of both PCAs is unchanged. 3. Mixed plaque resulting in approximately 50% stenosis of the proximal right ICA in the neck, unchanged. 4. Calcified plaque at the left carotid bifurcation is also suspected to resulting in approximately 50% stenosis, though evaluation is made difficult by blooming artifact from the calcium. 5. 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 6. Unchanged right neck mass. Electronically Signed   By: Lesia Hausen M.D.   On: 03/12/2023 12:51   MR BRAIN WO CONTRAST  Result Date: 03/11/2023 CLINICAL DATA:  Transient ischemic attack (TIA). EXAM: MRI HEAD WITHOUT CONTRAST TECHNIQUE: Multiplanar, multiecho pulse sequences of the brain and surrounding structures were obtained without intravenous contrast. COMPARISON:  MRI brain 02/12/2023. FINDINGS: Brain: Acute infarct of the right anterior thalamus. No acute hemorrhage or significant mass effect. Background of severe chronic small-vessel disease with old infarcts in the bilateral thalami and basal ganglia, as well as the  bilateral cerebellar hemispheres. No hydrocephalus or extra-axial collection. Vascular: Normal flow voids. Skull and upper cervical spine: Normal marrow signal. Sinuses/Orbits: No acute findings. Other: None. IMPRESSION: 1. Acute infarct of the right anterior thalamus. No acute hemorrhage or significant mass effect. 2. Background of severe chronic small-vessel disease with old infarcts in the  bilateral thalami and basal ganglia, as well as the bilateral cerebellar hemispheres. Electronically Signed   By: Orvan Falconer M.D.   On: 03/11/2023 16:21   CT HEAD WO CONTRAST  Result Date: 03/11/2023 CLINICAL DATA:  Neuro deficit with acute stroke suspected EXAM: CT HEAD WITHOUT CONTRAST TECHNIQUE: Contiguous axial images were obtained from the base of the skull through the vertex without intravenous contrast. RADIATION DOSE REDUCTION: This exam was performed according to the departmental dose-optimization program which includes automated exposure control, adjustment of the mA and/or kV according to patient size and/or use of iterative reconstruction technique. COMPARISON:  Brain MRI 02/12/2023 FINDINGS: Brain: No evidence of acute infarction, hemorrhage, hydrocephalus, extra-axial collection or mass lesion/mass effect. Chronic small vessel ischemia in the cerebral white matter with chronic perforator infarcts in the deep gray nuclei and chronic. Small vessel infarcts in the bilateral cerebellum. There is brain atrophy that is pronounced with ventriculomegaly. Vascular: No hyperdense vessel or unexpected calcification. Skull: Normal. Negative for fracture or focal lesion. Sinuses/Orbits: No acute finding. IMPRESSION: Advanced chronic small vessel disease and brain atrophy. No acute superimposed finding. Electronically Signed   By: Tiburcio Pea M.D.   On: 03/11/2023 12:49   ECHOCARDIOGRAM COMPLETE BUBBLE STUDY  Result Date: 02/15/2023    ECHOCARDIOGRAM REPORT   Patient Name:   LAURO MANLOVE Date of Exam:  02/15/2023 Medical Rec #:  [id number]    Height:       65.0 in Accession #:    [id number]   Weight:       180.8 lb Date of Birth:  June 01, 1930    BSA:          1.895 m Patient Age:    89 years     BP:           122/80 mmHg Patient Gender: M            HR:           87 bpm. Exam Location:  [facility] Procedure: 2D Echo, Cardiac Doppler, Color Doppler and Saline Contrast Bubble            Study Indications:     Stroke 434.91 /I63.9  History:         Patient has no prior history of Echocardiogram examinations.                  Risk Factors:Dyslipidemia and Hypertension.  Sonographer:     Cristela Blue Referring Phys:  [id number] Tresa Moore Diagnosing Phys: Arvilla Meres MD IMPRESSIONS  1. Left ventricular ejection fraction, by estimation, is 60 to 65%. The left ventricle has normal function. The left ventricle has no regional wall motion abnormalities. There is mild concentric left ventricular hypertrophy of the basal-septal segment. Left ventricular diastolic parameters are consistent with Grade I diastolic dysfunction (impaired relaxation).  2. Right ventricular systolic function is normal. The right ventricular size is normal.  3. The mitral valve is normal in structure. Trivial mitral valve regurgitation. No evidence of mitral stenosis. Moderate mitral annular calcification.  4. The aortic valve is tricuspid. There is mild calcification of the aortic valve. Aortic valve regurgitation is not visualized. Mild aortic valve stenosis.  5. Aortic dilatation noted. There is borderline dilatation of the aortic root, measuring 38 mm.  6. Agitated saline contrast bubble study was negative, with no evidence of any interatrial shunt. FINDINGS  Left Ventricle: Left ventricular ejection fraction, by estimation, is 60 to 65%. The left ventricle has normal function. The left ventricle  has no regional wall motion abnormalities. The left ventricular internal cavity size was normal in size. There is  mild concentric left ventricular  hypertrophy of the basal-septal segment. Left ventricular diastolic parameters are consistent with Grade I diastolic dysfunction (impaired relaxation). Right Ventricle: The right ventricular size is normal. No increase in right ventricular wall thickness. Right ventricular systolic function is normal. Left Atrium: Left atrial size was normal in size. Right Atrium: Right atrial size was normal in size. Pericardium: There is no evidence of pericardial effusion. Mitral Valve: The mitral valve is normal in structure. Moderate mitral annular calcification. Trivial mitral valve regurgitation. No evidence of mitral valve stenosis. MV peak gradient, 5.0 mmHg. The mean mitral valve gradient is 2.0 mmHg. Tricuspid Valve: The tricuspid valve is normal in structure. Tricuspid valve regurgitation is trivial. No evidence of tricuspid stenosis. Aortic Valve: The aortic valve is tricuspid. There is mild calcification of the aortic valve. Aortic valve regurgitation is not visualized. Mild aortic stenosis is present. Aortic valve mean gradient measures 8.0 mmHg. Aortic valve peak gradient measures  14.1 mmHg. Aortic valve area, by VTI measures 1.81 cm. Pulmonic Valve: The pulmonic valve was not well visualized. Pulmonic valve regurgitation is not visualized. No evidence of pulmonic stenosis. Aorta: Aortic dilatation noted. There is borderline dilatation of the aortic root, measuring 38 mm. Venous: The inferior vena cava was not well visualized. IAS/Shunts: No atrial level shunt detected by color flow Doppler. Agitated saline contrast was given intravenously to evaluate for intracardiac shunting. Agitated saline contrast bubble study was negative, with no evidence of any interatrial shunt.  LEFT VENTRICLE PLAX 2D LVIDd:         3.70 cm   Diastology LVIDs:         2.20 cm   LV e' medial:    5.22 cm/s LV PW:         1.10 cm   LV E/e' medial:  11.4 LV IVS:        1.40 cm   LV e' lateral:   6.53 cm/s LVOT diam:     2.10 cm   LV E/e'  lateral: 9.1 LV SV:         60 LV SV Index:   32 LVOT Area:     3.46 cm  RIGHT VENTRICLE RV Basal diam:  2.60 cm RV Mid diam:    2.40 cm RV S prime:     14.50 cm/s TAPSE (M-mode): 1.5 cm LEFT ATRIUM             Index        RIGHT ATRIUM           Index LA diam:        2.90 cm 1.53 cm/m   RA Area:     11.20 cm LA Vol (A2C):   60.3 ml 31.82 ml/m  RA Volume:   21.10 ml  11.13 ml/m LA Vol (A4C):   51.9 ml 27.39 ml/m LA Biplane Vol: 60.8 ml 32.08 ml/m  AORTIC VALVE AV Area (Vmax):    1.59 cm AV Area (Vmean):   1.60 cm AV Area (VTI):     1.81 cm AV Vmax:           188.00 cm/s AV Vmean:          125.333 cm/s AV VTI:            0.333 m AV Peak Grad:      14.1 mmHg AV Mean Grad:  8.0 mmHg LVOT Vmax:         86.20 cm/s LVOT Vmean:        57.900 cm/s LVOT VTI:          0.174 m LVOT/AV VTI ratio: 0.52  AORTA Ao Root diam: 3.80 cm MITRAL VALVE               TRICUSPID VALVE MV Area (PHT): 4.89 cm    TR Peak grad:   20.2 mmHg MV Area VTI:   2.07 cm    TR Vmax:        225.00 cm/s MV Peak grad:  5.0 mmHg MV Mean grad:  2.0 mmHg    SHUNTS MV Vmax:       1.12 m/s    Systemic VTI:  0.17 m MV Vmean:      60.0 cm/s   Systemic Diam: 2.10 cm MV Decel Time: 155 msec MV E velocity: 59.70 cm/s MV A velocity: 99.60 cm/s MV E/A ratio:  0.60 Arvilla Meres MD Electronically signed by Arvilla Meres MD Signature Date/Time: 02/15/2023/4:14:03 PM    Final     ECHO as above  TELEMETRY reviewed by me 03/16/2023: not on telemetry  EKG reviewed by me: sinus rhythm 1st degree AVB rate 58 bpm  Data reviewed by me 03/16/2023: last 24h vitals tele labs imaging I/O ED provider note, admission H&P  Principal Problem:   Acute CVA (cerebrovascular accident) (HCC)    ASSESSMENT AND PLAN:  ATREYU MAK is a 89 y.o. male  with a past medical history of hypertension, hyperlipidemia, GERD, recent CVA 3 weeks ago treated at [facility] who presented to the ED on 03/11/2023 for weakness and dizziness. New CVA this admission, concern for  significant R carotid stenosis, vascular is planning for R CEA. Cardiology was consulted for preoperative evaluation.   # Preoperative cardiac evaluation # Acute CVA, R carotid artery stenosis Patient with recent CVA treated at [facility] presented with worsening dizziness, weakness. Found to have acute infarct of the right anterior thalamus on MRI this admission. CTA Head neck concerning for R carotid stenosis, likely cause of recent CVAs. Plan for R CEA on Friday with Dr. Gilda Crease. Echo this admission with preserved EF 60-65%, mild LVH, negative bubble study. No hx of or recent cardiac issues. Denies any chest pain, DOE, exertional or anginal symptoms.  -Continue aspirin 81 mg daily, plavix 75 mg daily.   -Patient is at elevated but acceptable risk for proceeding with carotid endarterectomy as scheduled with vascular surgery. No further cardiac workup indicated at this time.   # Hypertension  # Hyperlipidemia Patient with history of hypertension and hyperlipidemia. Lipid panel this admission with TC 147, LDL 87, triglycerides 162. BP controlled.  -Continue lovastatin 40 mg daily.    This patient's plan of care was discussed and created with Dr. Melton Alar and she is in agreement.  Signed: Gale Journey, PA-C  03/16/2023, 1:12 PM [facility]

## 2023-03-16 NOTE — Progress Notes (Signed)
PROGRESS NOTE    Thomas [name]  WUJ:[id number] DOB: 1931-03-16 DOA: 03/11/2023 PCP: Barbette Reichmann, MD    Brief Narrative:   89 y.o. male with medical history significant of hypertension, hyperlipidemia, GERD, recent CVA 3 weeks ago treated at [facility] was brought to the ED due to generalized weakness and dizziness being off balance x 1 day.  Patient came home from [facility] rehab 7 days ago after multifocal small vessel infarcts.  Was doing well at home.  Ambulating on his own with good functional status, able to perform basic ADLs.   1 night before presentation to [facility] family states the patient did not want to eat dinner and started complaining of dizziness.  The morning of presentation patient was having trouble with coordination unable to raise himself out of bed.  No evidence of trauma.   On presentation to the ED the patient is hemodynamically stable.  MRI reveals new acute CVA.  On my interview the patient is resting comfortably in bed.  Son-in-law at bedside.  Primary caregiver.  States that the patient was discharged on dual antiplatelet therapy aspirin and Plavix and has been compliant with all medications.  11/1: Patient seen in consultation by neurology who ordered CTA to evaluate carotid anatomy.  Pending CTA results and possible vascular surgery consult.  Pending PT and OT evaluations.  11/2: Case discussed at length with neurology.  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  Vascular surgery engaged for comment consideration of carotid endarterectomy  11/3: Pending vascular surgery evaluation and recommendations    Assessment & Plan:   Principal Problem:   Acute CVA (cerebrovascular accident) [facility])  Acute CVA Patient presents 3 weeks after previous hospitalization and discharged from [facility].  Now with evidence of new acute CVA on imaging.  Patient son-in-law at bedside reports compliance with  dual antiplatelet therapy and antihyperlipidemic therapy at home. Seen in consultation by neurology Concerned that recurrent strokes within the same vascular territory are secondary to calcium emboli Plan: Continue DAPT Continue statin, lipid profile at goal Continue PT/OT/mobilization efforts Diet advanced per SLP recommendations Vascular consult.  Patient considered a possible candidate for surgical endarterectomy.  Will need cardiac clearance.  May be deferred outpatient.   Essential hypertension Blood pressure controlled Normotensive blood pressure goals   Hyperlipidemia PTA statin.  Lipid profile at goal   GERD PPI    DVT prophylaxis: SQ Lovenox Code Status: DNR Family Communication: None today Disposition Plan: Status is: Observation The patient will require care spanning > 2 midnights and should be moved to inpatient because: Acute CVA.  Concern for calcium emboli within the PCA distribution.  Vascular surgery engaged.  Pending official consult and recommendations.   Level of care: Telemetry Medical  Consultants:  Neurology  Procedures:  None  Antimicrobials: None   Subjective: Seen and examined.  Resting in bed.  No visible distress.  Objective: Vitals:   03/15/23 0556 03/15/23 1811 03/15/23 2001 03/16/23 0441  BP: (!) 140/83 121/75 123/88 139/89  Pulse: (!) 58 78 64 72  Resp: 18 18 17 17   Temp: 97.7 F (36.5 C) 98.1 F (36.7 C) 98.2 F (36.8 C) 98.3 F (36.8 C)  TempSrc:    Oral  SpO2: 95% 94% 95% 96%  Height:        Intake/Output Summary (Last 24 hours) at 03/16/2023 1122 Last data filed at 03/16/2023 0300 Gross per 24 hour  Intake 200 ml  Output  550 ml  Net -350 ml   There were no vitals filed for this visit.  Examination:  General exam: NAD Respiratory system: Lungs clear.  Normal work of breathing.  Room air Cardiovascular system: S1-2, RRR, no murmurs, no pedal edema Gastrointestinal system: Soft, NT/ND, normal bowel  sounds Central nervous system: Alert.  Oriented x 2.  No focal deficits Extremities: Symmetric 5 x 5 power.  Gait not assessed Skin: No rashes, lesions or ulcers Psychiatry: Judgement and insight appear normal. Mood & affect appropriate.     Data Reviewed: I have personally reviewed following labs and imaging studies  CBC: Recent Labs  Lab 03/11/23 1202 03/15/23 1128  WBC 5.8 4.3  NEUTROABS 3.8 2.6  HGB 14.2 13.7  HCT 43.2 41.6  MCV 92.3 91.2  PLT 206 206   Basic Metabolic Panel: Recent Labs  Lab 03/11/23 1202 03/15/23 1128  NA 140 138  K 4.0 3.6  CL 104 106  CO2 27 25  GLUCOSE 94 143*  BUN 12 16  CREATININE 0.94 1.02  CALCIUM 9.2 9.1   GFR: CrCl cannot be calculated (Unknown ideal weight.). Liver Function Tests: Recent Labs  Lab 03/11/23 1202  AST 21  ALT 18  ALKPHOS 98  BILITOT 0.7  PROT 6.8  ALBUMIN 3.7   No results for input(s): "LIPASE", "AMYLASE" in the last 168 hours. No results for input(s): "AMMONIA" in the last 168 hours. Coagulation Profile: Recent Labs  Lab 03/11/23 1359  INR 1.1   Cardiac Enzymes: No results for input(s): "CKTOTAL", "CKMB", "CKMBINDEX", "TROPONINI" in the last 168 hours. BNP (last 3 results) No results for input(s): "PROBNP" in the last 8760 hours. HbA1C: No results for input(s): "HGBA1C" in the last 72 hours. CBG: Recent Labs  Lab 03/11/23 1149  GLUCAP 103*   Lipid Profile: No results for input(s): "CHOL", "HDL", "LDLCALC", "TRIG", "CHOLHDL", "LDLDIRECT" in the last 72 hours.  Thyroid Function Tests: No results for input(s): "TSH", "T4TOTAL", "FREET4", "T3FREE", "THYROIDAB" in the last 72 hours. Anemia Panel: No results for input(s): "VITAMINB12", "FOLATE", "FERRITIN", "TIBC", "IRON", "RETICCTPCT" in the last 72 hours. Sepsis Labs: No results for input(s): "PROCALCITON", "LATICACIDVEN" in the last 168 hours.  Recent Results (from the past 240 hour(s))  SARS Coronavirus 2 by RT PCR (hospital order, performed  in [facility] hospital lab) *cepheid single result test* Anterior Nasal Swab     Status: None   Collection Time: 03/11/23  1:59 PM   Specimen: Anterior Nasal Swab  Result Value Ref Range Status   SARS Coronavirus 2 by RT PCR NEGATIVE NEGATIVE Final    Comment: (NOTE) SARS-CoV-2 target nucleic acids are NOT DETECTED.  The SARS-CoV-2 RNA is generally detectable in upper and lower respiratory specimens during the acute phase of infection. The lowest concentration of SARS-CoV-2 viral copies this assay can detect is 250 copies / mL. A negative result does not preclude SARS-CoV-2 infection and should not be used as the sole basis for treatment or other patient management decisions.  A negative result may occur with improper specimen collection / handling, submission of specimen other than nasopharyngeal swab, presence of viral mutation(s) within the areas targeted by this assay, and inadequate number of viral copies (<250 copies / mL). A negative result must be combined with clinical observations, patient history, and epidemiological information.  Fact Sheet for Patients:   RoadLapTop.co.za  Fact Sheet for Healthcare Providers: http://kim-miller.com/  This test is not yet approved or  cleared by the [place] and has been authorized  for detection and/or diagnosis of SARS-CoV-2 by FDA under an Emergency Use Authorization (EUA).  This EUA will remain in effect (meaning this test can be used) for the duration of the COVID-19 declaration under Section 564(b)(1) of the Act, 21 U.S.C. section 360bbb-3(b)(1), unless the authorization is terminated or revoked sooner.  Performed at [facility], [address] [place], [place] [address]          Radiology Studies: No results found.      Scheduled Meds:  aspirin EC  81 mg Oral Daily   bisacodyl  10 mg Oral QHS   clopidogrel  75 mg Oral Daily   diclofenac Sodium  4 g  Topical QID   enoxaparin (LOVENOX) injection  40 mg Subcutaneous Q24H   melatonin  2.5 mg Oral QHS   pantoprazole  40 mg Oral Daily   polyethylene glycol  17 g Oral Daily   pravastatin  10 mg Oral q1800   sertraline  25 mg Oral Daily   Continuous Infusions:   LOS: 0 days     Tresa Moore, MD [facility]   If 7PM-7AM, please contact night-coverage  03/16/2023, 11:22 AM

## 2023-03-16 NOTE — NC FL2 (Signed)
[place] MEDICAID FL2 LEVEL OF CARE FORM     IDENTIFICATION  Patient Name: Thomas [name] Birthdate: 05/13/1930 Sex: male Admission Date (Current Location): 03/11/2023  [facility] and [place] Number:  Chiropodist and Address:  [facility], [address], [place], [place] [address]      Provider Number: [id number]  Attending Physician Name and Address:  Tresa Moore, MD  Relative Name and Phone Number:       Current Level of Care: Hospital Recommended Level of Care: Skilled Nursing Facility Prior Approval Number:    Date Approved/Denied:   PASRR Number: [id number]  Discharge Plan:      Current Diagnoses: Patient Active Problem List   Diagnosis Date Noted   Acute CVA (cerebrovascular accident) (HCC) 02/13/2023   Rhabdomyolysis 02/12/2023   Hypophosphatemia 05/22/2021   Traumatic rhabdomyolysis (HCC)    Neuropathy    Rhabdomyolysis due to COVID-19 05/19/2021   Weakness    Acute respiratory failure with hypoxia (HCC)    Insomnia    Acute metabolic encephalopathy    COVID-19 virus infection 05/28/2020   GERD (gastroesophageal reflux disease)    Hypertension    HLD (hyperlipidemia)    Multiple lung nodules 12/05/2018   Mass of right side of neck 01/27/2018    Orientation RESPIRATION BLADDER Height & Weight     Self, Place      Weight:   Height:  5\' 8"  (172.7 cm)  BEHAVIORAL SYMPTOMS/MOOD NEUROLOGICAL BOWEL NUTRITION STATUS           AMBULATORY STATUS COMMUNICATION OF NEEDS Skin     Verbally Normal                       Personal Care Assistance Level of Assistance              Functional Limitations Info             SPECIAL CARE FACTORS FREQUENCY        PT Frequency: 5 times a week OT Frequency: 5 times a week            Contractures Contractures Info: Not present    Additional Factors Info  Code Status               Current Medications (03/16/2023):  This is the current hospital  active medication list Current Facility-Administered Medications  Medication Dose Route Frequency Provider Last Rate Last Admin   acetaminophen (TYLENOL) tablet 650 mg  650 mg Oral Q4H PRN Sreenath, Sudheer B, MD       Or   acetaminophen (TYLENOL) 160 MG/5ML solution 650 mg  650 mg Per Tube Q4H PRN Sreenath, Sudheer B, MD       Or   acetaminophen (TYLENOL) suppository 650 mg  650 mg Rectal Q4H PRN Lolita Patella B, MD       aspirin EC tablet 81 mg  81 mg Oral Daily Georgeann Oppenheim, Sudheer B, MD   81 mg at 03/16/23 1032   bisacodyl (DULCOLAX) EC tablet 10 mg  10 mg Oral QHS Sreenath, Sudheer B, MD   10 mg at 03/15/23 2124   bisacodyl (DULCOLAX) suppository 10 mg  10 mg Rectal Daily PRN Lolita Patella B, MD       clopidogrel (PLAVIX) tablet 75 mg  75 mg Oral Daily Sreenath, Sudheer B, MD   75 mg at 03/16/23 1032   diclofenac Sodium (VOLTAREN) 1 % topical gel 4 g  4 g Topical  QID Lolita Patella B, MD   4 g at 03/16/23 1041   enoxaparin (LOVENOX) injection 40 mg  40 mg Subcutaneous Q24H Lolita Patella B, MD   40 mg at 03/15/23 1733   hydrALAZINE (APRESOLINE) injection 10 mg  10 mg Intravenous Q6H PRN Lolita Patella B, MD       melatonin tablet 2.5 mg  2.5 mg Oral QHS Georgeann Oppenheim, Sudheer B, MD   2.5 mg at 03/15/23 2124   pantoprazole (PROTONIX) EC tablet 40 mg  40 mg Oral Daily Georgeann Oppenheim, Sudheer B, MD   40 mg at 03/16/23 1032   polyethylene glycol (MIRALAX / GLYCOLAX) packet 17 g  17 g Oral Daily Georgeann Oppenheim, Sudheer B, MD   17 g at 03/16/23 1040   pravastatin (PRAVACHOL) tablet 10 mg  10 mg Oral q1800 Lolita Patella B, MD   10 mg at 03/15/23 1732   senna (SENOKOT) tablet 8.6 mg  1 tablet Oral QHS PRN Lolita Patella B, MD       sertraline (ZOLOFT) tablet 25 mg  25 mg Oral Daily Lolita Patella B, MD   25 mg at 03/16/23 1032     Discharge Medications: Please see discharge summary for a list of discharge medications.  Relevant Imaging Results:  Relevant Lab Results:   Additional  Information [id number]  Allena Katz, LCSW

## 2023-03-16 NOTE — Plan of Care (Signed)
  Problem: Coping: Goal: Will verbalize positive feelings about self Outcome: Progressing   Problem: Health Behavior/Discharge Planning: Goal: Ability to manage health-related needs will improve Outcome: Progressing Goal: Goals will be collaboratively established with patient/family Outcome: Progressing   Problem: Self-Care: Goal: Ability to participate in self-care as condition permits will improve Outcome: Progressing Goal: Verbalization of feelings and concerns over difficulty with self-care will improve Outcome: Progressing Goal: Ability to communicate needs accurately will improve Outcome: Progressing

## 2023-03-17 DIAGNOSIS — I639 Cerebral infarction, unspecified: Secondary | ICD-10-CM | POA: Diagnosis not present

## 2023-03-17 NOTE — Progress Notes (Signed)
Occupational Therapy Treatment Patient Details Name: Thomas [name] MRN: [id number] DOB: 1931/01/18 Today's Date: 03/17/2023   History of present illness Pt is an 89 yo male with incoordination, trouble getting out of bed, and weakness the morning of presentation to the ED. MRI reveals new acute CVA. Per MD note, pt had a "recent CVA 3 weeks ago treated at [facility] was brought to the ED due to generalized weakness and dizziness being off balance x 1 day. Patient came home from [facility] rehab 7 days ago after multifocal small vessel infarcts. 1 night before presentation to [facility] family states the patient did not want to eat dinner and started complaining of dizziness." Other PMH includes HTN, hyperlipidemia, GERD, and B/L carpal tunnel.   OT comments  Thomas [name] was seen for OT treatment on this date. Upon arrival to room pt seated in chair, agreeable to tx. Pt requires SETUP self-feeding, pt reports improvement of carpal tunnel symptoms with use of voltaren gel. MAX A + RW sit<>stand x4 trials, poor standing balance/tolerance and heavy posterior lean. Educated on safe transfer technique and HEP. Pt making progress toward goals, will continue to follow POC. Discharge recommendation remains appropriate.       If plan is discharge home, recommend the following:  A lot of help with walking and/or transfers;A lot of help with bathing/dressing/bathroom;Supervision due to cognitive status;Direct supervision/assist for financial management;Assistance with cooking/housework;Assist for transportation;Help with stairs or ramp for entrance;Direct supervision/assist for medications management   Equipment Recommendations  Other (comment) (defer)    Recommendations for Other Services      Precautions / Restrictions Precautions Precautions: Fall Restrictions Weight Bearing Restrictions: No       Mobility Bed Mobility               General bed mobility comments: not tested    Transfers Overall transfer  level: Needs assistance Equipment used: Rolling walker (2 wheels) Transfers: Sit to/from Stand Sit to Stand: Max assist           General transfer comment: x3 standing trials, poor eccentric control     Balance Overall balance assessment: Needs assistance Sitting-balance support: Feet supported, No upper extremity supported Sitting balance-[name]: Fair     Standing balance support: Bilateral upper extremity supported, Reliant on assistive device for balance, During functional activity Standing balance-[name]: Poor                             ADL either performed or assessed with clinical judgement   ADL Overall ADL's : Needs assistance/impaired                                       General ADL Comments: SETUP self-feeding, pt reports improvement of carpal tunnel symptoms with use of voltaren gel. MAX A simulated BSC t/f, poor standing balance/tolerance      Cognition Arousal: Alert Behavior During Therapy: WFL for tasks assessed/performed Overall Cognitive Status: No family/caregiver present to determine baseline cognitive functioning                                 General Comments: follows commands with step by step cues                   Pertinent Vitals/ Pain  Pain Assessment Pain Assessment: No/denies pain   Frequency  Min 1X/week        Progress Toward Goals  OT Goals(current goals can now be found in the care plan section)  Progress towards OT goals: Progressing toward goals  Acute Rehab OT Goals Patient Stated Goal: to go to rehab OT Goal Formulation: With patient Time For Goal Achievement: 03/26/23 Potential to Achieve Goals: Good ADL Goals Pt Will Perform Grooming: standing;with min assist Pt Will Perform Lower Body Dressing: sit to/from stand;with min assist Pt Will Transfer to Toilet: with min assist;bedside commode;ambulating Pt Will Perform Toileting - Clothing Manipulation  and hygiene: with min assist;sit to/from stand  Plan      Co-evaluation                 AM-PAC OT "6 Clicks" Daily Activity     Outcome Measure   Help from another person eating meals?: None Help from another person taking care of personal grooming?: A Little Help from another person toileting, which includes using toliet, bedpan, or urinal?: A Lot Help from another person bathing (including washing, rinsing, drying)?: A Lot Help from another person to put on and taking off regular upper body clothing?: A Little Help from another person to put on and taking off regular lower body clothing?: A Lot 6 Click Score: 16    End of Session    OT Visit Diagnosis: Unsteadiness on feet (R26.81);Other abnormalities of gait and mobility (R26.89);Repeated falls (R29.6);Muscle weakness (generalized) (M62.81);History of falling (Z91.81)   Activity Tolerance Patient tolerated treatment well   Patient Left in chair;with call bell/phone within reach;with chair alarm set   Nurse Communication          Time: 6962-9528 OT Time Calculation (min): 10 min  Charges: OT General Charges $OT Visit: 1 Visit OT Treatments $Therapeutic Exercise: 8-22 mins  Kathie Dike, M.S. OTR/L  03/17/23, 10:37 AM  ascom [phone]

## 2023-03-17 NOTE — Plan of Care (Signed)

## 2023-03-17 NOTE — Progress Notes (Signed)
Physical Therapy Treatment Patient Details Name: Thomas [name] MRN: [id number] DOB: 1930/10/05 Today's Date: 03/17/2023   History of Present Illness Pt is an 89 yo male with incoordination, trouble getting out of bed, and weakness the morning of presentation to the ED. MRI reveals new acute CVA. Per MD note, pt had a "recent CVA 3 weeks ago treated at [facility] was brought to the ED due to generalized weakness and dizziness being off balance x 1 day. Patient came home from [facility] rehab 7 days ago after multifocal small vessel infarcts. 1 night before presentation to [facility] family states the patient did not want to eat dinner and started complaining of dizziness." Other PMH includes HTN, hyperlipidemia, GERD, and B/L carpal tunnel.    PT Comments  Pt very pleasant and eager to work with PT.  He showed good effort, but needed excessive and almost constant cuing to stay on task, organize the next movement, insure safety/AD use, etc.  He was able to rise with mod A X 4 t/o the session (initial standing attempt revealed strong posterior lean), adjusted walker height and cued for upright posturing and, though still unsteady, he was able to maintain static standing in the walker w/o LOBs and even managed a few small steps w/o need for direct assist to maintain upright, though close CGA needed t/o due to his inconsistency.  Pt will benefit from further PT, continue with POC.     If plan is discharge home, recommend the following: Two people to help with walking and/or transfers;Two people to help with bathing/dressing/bathroom;Assistance with cooking/housework;Assistance with feeding;Direct supervision/assist for medications management;Assist for transportation;Supervision due to cognitive status;Help with stairs or ramp for entrance   Can travel by private vehicle     Yes  Equipment Recommendations  Other (comment) (TBD at next venue)    Recommendations for Other Services Rehab consult     Precautions /  Restrictions Precautions Precautions: Fall Restrictions Weight Bearing Restrictions: No     Mobility  Bed Mobility               General bed mobility comments: not tested - recliner pre/post session    Transfers Overall transfer level: Needs assistance Equipment used: Rolling walker (2 wheels) Transfers: Sit to/from Stand Sit to Stand: Mod assist           General transfer comment: multiple standing efforts.  After heavy posterior lean on first trial PT lowered walker and explicitly cued him to get his weight forward and over the walker.  On subsequent standing attempts he still needed modA to attain upright but was much better able to maintain standing balance, albeit not with ideal posture.  Despite cuing before all return to sitting efforts he was inconsitent about hand placement and showed poor eccentric control "crashing" back to the seat.    Ambulation/Gait Ambulation/Gait assistance: Mod assist Gait Distance (Feet): 6 Feet Assistive device: Rolling walker (2 wheels)         General Gait Details: Pt struggled to show any consistency with steppage and had inconsistent clearance and step length with each discordant step, self selected wide BOS. Despite repeated and clear cuing hecould not put more than 1-2 steps together at a time to actually show a real cadence.  Very slow with poor confidence during this short distance but relatively long duration bout of "ambulation"   Stairs             Wheelchair Mobility     Tilt Bed  Modified Rankin (Stroke Patients Only)       Balance Overall balance assessment: Needs assistance Sitting-balance support: Feet supported, No upper extremity supported Sitting balance-[name]: Fair     Standing balance support: Bilateral upper extremity supported, Reliant on assistive device for balance, During functional activity Standing balance-[name]: Poor Standing balance comment: initially losing balance  backward, improved weight shift/posture with cuing and lowered walker height                            Cognition Arousal: Alert Behavior During Therapy: WFL for tasks assessed/performed Overall Cognitive Status: Difficult to assess                                 General Comments: follows commands with step by step cues - needing consistent cuing for redirection        Exercises      General Comments        Pertinent Vitals/Pain Pain Assessment Pain Assessment: No/denies pain    Home Living                          Prior Function            PT Goals (current goals can now be found in the care plan section) Progress towards PT goals: Progressing toward goals    Frequency    Min 1X/week      PT Plan      Co-evaluation              AM-PAC PT "6 Clicks" Mobility   Outcome Measure  Help needed turning from your back to your side while in a flat bed without using bedrails?: A Lot Help needed moving from lying on your back to sitting on the side of a flat bed without using bedrails?: A Lot Help needed moving to and from a bed to a chair (including a wheelchair)?: A Lot Help needed standing up from a chair using your arms (e.g., wheelchair or bedside chair)?: A Lot Help needed to walk in hospital room?: A Lot Help needed climbing 3-5 steps with a railing? : Total 6 Click Score: 11    End of Session Equipment Utilized During Treatment: Gait belt Activity Tolerance: Patient limited by fatigue Patient left: with call bell/phone within reach;in chair;with chair alarm set Nurse Communication: Mobility status PT Visit Diagnosis: Unsteadiness on feet (R26.81);Other abnormalities of gait and mobility (R26.89);Repeated falls (R29.6);Muscle weakness (generalized) (M62.81);Hemiplegia and hemiparesis Hemiplegia - Right/Left: Left Hemiplegia - caused by: Cerebral infarction     Time: 0160-1093 PT Time Calculation (min) (ACUTE  ONLY): 16 min  Charges:    $Therapeutic Activity: 8-22 mins PT General Charges $$ ACUTE PT VISIT: 1 Visit                     Malachi Pro, DPT 03/17/2023, 3:51 PM

## 2023-03-17 NOTE — Progress Notes (Signed)
PROGRESS NOTE    Thomas [name]  WUJ:[id number] DOB: 06-21-1930 DOA: 03/11/2023 PCP: Barbette Reichmann, MD  118A/118A-AA  LOS: 1 day   Brief hospital course:   Assessment & Plan:  89 y.o. male with medical history significant of hypertension, hyperlipidemia, GERD, recent CVA 3 weeks ago treated at [facility] was brought to the ED due to generalized weakness and dizziness being off balance x 1 day.  Patient came home from [facility] rehab 7 days ago after multifocal small vessel infarcts.  Was doing well at home.  Ambulating on his own with good functional status, able to perform basic ADLs.   1 night before presentation to [facility] family states the patient did not want to eat dinner and started complaining of dizziness.  The morning of presentation patient was having trouble with coordination unable to raise himself out of bed.  No evidence of trauma.   On presentation to the ED the patient is hemodynamically stable.  MRI reveals new acute CVA.  On my interview the patient is resting comfortably in bed.  Son-in-law at bedside.  Primary caregiver.  States that the patient was discharged on dual antiplatelet therapy aspirin and Plavix and has been compliant with all medications.   11/1: Patient seen in consultation by neurology who ordered CTA to evaluate carotid anatomy.  Pending CTA results and possible vascular surgery consult.  Pending PT and OT evaluations.   11/2: Case discussed at length with neurology.  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  Vascular surgery engaged for comment consideration of carotid endarterectomy   Acute CVA Patient presents 3 weeks after previous hospitalization for stroke and discharged from [facility].  Now with evidence of new acute CVA on imaging.  Patient son-in-law at bedside reports compliance with dual antiplatelet therapy and antihyperlipidemic therapy at home. Seen in consultation by  neurology Concerned that recurrent strokes within the same vascular territory are secondary to calcium emboli Plan: Continue DAPT Continue statin, lipid profile at goal --Carotid endarterectomy on Friday   Essential hypertension Blood pressure controlled Normotensive blood pressure goals   Hyperlipidemia --LDL 87 --cont statin   GERD --cont PPI   DVT prophylaxis: Lovenox SQ Code Status: DNR  Family Communication: son-in-law updated at bedside today Level of care: Telemetry Medical Dispo:   The patient is from: home Anticipated d/c is to: SNF rehab Anticipated d/c date is: > 3 days   Subjective and Interval History:  Pt complained of pain in right toe.  Normal oral intake, urination and BM.   Objective: Vitals:   03/17/23 0431 03/17/23 0903 03/17/23 1137 03/17/23 1511  BP: 126/83 (!) 138/102 107/69 112/69  Pulse: 62 (!) 57 77 70  Resp:  19 18 17   Temp: 97.6 F (36.4 C) 97.6 F (36.4 C) 97.9 F (36.6 C) 98.4 F (36.9 C)  TempSrc:  Oral  Oral  SpO2: 92% 98% 92% 96%  Height:        Intake/Output Summary (Last 24 hours) at 03/17/2023 1934 Last data filed at 03/17/2023 1312 Gross per 24 hour  Intake --  Output 200 ml  Net -200 ml   There were no vitals filed for this visit.  Examination:   Constitutional: NAD, AAOx3 HEENT: conjunctivae and lids normal, EOMI CV: No cyanosis.   RESP: normal respiratory effort, on RA Extremities: mild erythema just below the nail bed of right big toe SKIN: warm, dry Psych: Normal mood and affect.  Appropriate judgement and reason   Data Reviewed: I have personally reviewed labs and imaging studies  Time spent: 35 minutes  [name], MD [facility] If 7PM-7AM, please contact night-coverage 03/17/2023, 7:34 PM

## 2023-03-18 DIAGNOSIS — I639 Cerebral infarction, unspecified: Secondary | ICD-10-CM | POA: Diagnosis not present

## 2023-03-18 LAB — TYPE AND SCREEN
ABO/RH(D): A POS
Antibody Screen: NEGATIVE

## 2023-03-18 LAB — CREATININE, SERUM
Creatinine, Ser: 1.07 mg/dL (ref 0.61–1.24)
GFR, Estimated: >60 mL/min (ref >60)

## 2023-03-18 LAB — ABO/RH: ABO/RH(D): A POS

## 2023-03-18 MED ORDER — CHLORHEXIDINE GLUCONATE 4 % EX SOLN
60.0000 mL | Freq: Once | CUTANEOUS | Status: AC
  Administered 2023-03-18: 4 via TOPICAL

## 2023-03-18 MED ORDER — METHYLPREDNISOLONE SODIUM SUCC 125 MG IJ SOLR: 125.0000 mg | Freq: Once | INTRAMUSCULAR | Status: DC | PRN

## 2023-03-18 MED ORDER — SODIUM CHLORIDE 0.9 % IV SOLN: INTRAVENOUS | Status: DC

## 2023-03-18 MED ORDER — DIPHENHYDRAMINE HCL 50 MG/ML IJ SOLN: 50.0000 mg | Freq: Once | INTRAMUSCULAR | Status: DC | PRN

## 2023-03-18 MED ORDER — CHLORHEXIDINE GLUCONATE 4 % EX SOLN: 60.0000 mL | Freq: Once | CUTANEOUS | Status: DC

## 2023-03-18 MED ORDER — CEFAZOLIN SODIUM-DEXTROSE 2-4 GM/100ML-% IV SOLN
2.0000 g | INTRAVENOUS | Status: AC
  Administered 2023-03-19: 2 g via INTRAVENOUS
  Filled 2023-03-18: qty 100

## 2023-03-18 MED ORDER — FAMOTIDINE 20 MG PO TABS: 40.0000 mg | ORAL_TABLET | Freq: Once | ORAL | Status: DC | PRN

## 2023-03-18 NOTE — Plan of Care (Signed)
  Problem: Education: Goal: Knowledge of disease or condition will improve Outcome: Progressing   Problem: Coping: Goal: Will verbalize positive feelings about self Outcome: Progressing   Problem: Nutrition: Goal: Risk of aspiration will decrease Outcome: Progressing   Problem: Education: Goal: Knowledge of General Education information will improve Description: Including pain rating scale, medication(s)/side effects and non-pharmacologic comfort measures Outcome: Progressing   Problem: Health Behavior/Discharge Planning: Goal: Ability to manage health-related needs will improve Outcome: Progressing   Problem: Clinical Measurements: Goal: Ability to maintain clinical measurements within normal limits will improve Outcome: Progressing   Problem: Activity: Goal: Risk for activity intolerance will decrease Outcome: Progressing   Problem: Coping: Goal: Level of anxiety will decrease Outcome: Progressing

## 2023-03-18 NOTE — Progress Notes (Signed)
PROGRESS NOTE    Thomas [name]  EXB:[id number] DOB: 01/26/1931 DOA: 03/11/2023 PCP: Barbette Reichmann, MD  118A/118A-AA  LOS: 2 days   Brief hospital course:   Assessment & Plan:  89 y.o. male with medical history significant of hypertension, hyperlipidemia, GERD, recent CVA 3 weeks ago treated at [facility] was brought to the ED due to generalized weakness and dizziness being off balance x 1 day.  Patient came home from [facility] rehab 7 days ago after multifocal small vessel infarcts.  Was doing well at home.  Ambulating on his own with good functional status, able to perform basic ADLs.   1 night before presentation to [facility] family states the patient did not want to eat dinner and started complaining of dizziness.  The morning of presentation patient was having trouble with coordination unable to raise himself out of bed.  No evidence of trauma.   On presentation to the ED the patient is hemodynamically stable.  MRI reveals new acute CVA.  On my interview the patient is resting comfortably in bed.  Son-in-law at bedside.  Primary caregiver.  States that the patient was discharged on dual antiplatelet therapy aspirin and Plavix and has been compliant with all medications.   11/1: Patient seen in consultation by neurology who ordered CTA to evaluate carotid anatomy.  Pending CTA results and possible vascular surgery consult.  Pending PT and OT evaluations.   11/2: Case discussed at length with neurology.  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  Vascular surgery engaged for comment consideration of carotid endarterectomy   Acute CVA Patient presents 3 weeks after previous hospitalization for stroke and discharged from [facility].  Now with evidence of new acute CVA on imaging.  Patient son-in-law at bedside reports compliance with dual antiplatelet therapy and antihyperlipidemic therapy at home. Seen in consultation by  neurology Concerned that recurrent strokes within the same vascular territory are secondary to calcium emboli --LDL 87.   Plan: --cont ASA and plavix Continue statin --Carotid endarterectomy on Friday   Essential hypertension Blood pressure controlled Normotensive blood pressure goals   Hyperlipidemia --LDL 87 --cont statin   GERD --cont PPI   DVT prophylaxis: Lovenox SQ Code Status: DNR  Family Communication: sister updated at bedside today Level of care: Telemetry Medical Dispo:   The patient is from: home Anticipated d/c is to: SNF rehab Anticipated d/c date is: > 3 days   Subjective and Interval History:  No new complaint today.   Objective: Vitals:   03/18/23 0447 03/18/23 0721 03/18/23 1259 03/18/23 1511  BP: 128/75 127/79 118/70 114/69  Pulse: (!) 54 (!) 52 62 (!) 54  Resp:   17 17  Temp: 98.1 F (36.7 C)  98 F (36.7 C) 98.1 F (36.7 C)  TempSrc:   Oral   SpO2: 97% 97% 97% 97%  Height:        Intake/Output Summary (Last 24 hours) at 03/18/2023 1931 Last data filed at 03/18/2023 1900 Gross per 24 hour  Intake 0 ml  Output 375 ml  Net -375 ml   There were no vitals filed for this visit.  Examination:   Constitutional: NAD, AAOx3, sitting in recliner HEENT: conjunctivae and lids normal, EOMI CV: No cyanosis.   RESP: normal respiratory effort, on RA SKIN: warm, dry Neuro: II - XII grossly intact.   Psych: Normal mood and affect.     Data Reviewed: I have personally reviewed labs  and imaging studies  Time spent: 35 minutes  [name], MD [facility] If 7PM-7AM, please contact night-coverage 03/18/2023, 7:31 PM

## 2023-03-18 NOTE — Plan of Care (Signed)
Pt's cognitive status makes education retention difficult.   Problem: Education: Goal: Knowledge of disease or condition will improve 03/18/2023 0508 by Garrison Columbus, RN Outcome: Not Progressing 03/18/2023 0507 by Garrison Columbus, RN Outcome: Progressing Goal: Knowledge of secondary prevention will improve (MUST DOCUMENT ALL) 03/18/2023 0508 by Garrison Columbus, RN Outcome: Not Progressing 03/18/2023 0507 by Garrison Columbus, RN Outcome: Progressing Goal: Knowledge of patient specific risk factors will improve Loraine Leriche N/A or DELETE if not current risk factor) 03/18/2023 0508 by Garrison Columbus, RN Outcome: Not Progressing 03/18/2023 0507 by Garrison Columbus, RN Outcome: Progressing   Problem: Education: Goal: Knowledge of General Education information will improve Description: Including pain rating scale, medication(s)/side effects and non-pharmacologic comfort measures 03/18/2023 0508 by Garrison Columbus, RN Outcome: Not Progressing 03/18/2023 0507 by Garrison Columbus, RN Outcome: Progressing ------------------------------------------------------------------------------ Problem: Ischemic Stroke/TIA Tissue Perfusion: Goal: Complications of ischemic stroke/TIA will be minimized 03/18/2023 0508 by Garrison Columbus, RN Outcome: Progressing 03/18/2023 0507 by Garrison Columbus, RN Outcome: Progressing   Problem: Coping: Goal: Will verbalize positive feelings about self 03/18/2023 0508 by Garrison Columbus, RN Outcome: Progressing 03/18/2023 0507 by Garrison Columbus, RN Outcome: Progressing Goal: Will identify appropriate support needs 03/18/2023 0508 by Garrison Columbus, RN Outcome: Progressing 03/18/2023 0507 by Garrison Columbus, RN Outcome: Progressing   Problem: Health Behavior/Discharge Planning: Goal: Ability to manage health-related needs will improve 03/18/2023 0508 by Garrison Columbus, RN Outcome: Progressing 03/18/2023 0507 by Garrison Columbus, RN Outcome:  Progressing Goal: Goals will be collaboratively established with patient/family 03/18/2023 0508 by Garrison Columbus, RN Outcome: Progressing 03/18/2023 0507 by Garrison Columbus, RN Outcome: Progressing   Problem: Self-Care: Goal: Ability to participate in self-care as condition permits will improve 03/18/2023 0508 by Garrison Columbus, RN Outcome: Progressing 03/18/2023 0507 by Garrison Columbus, RN Outcome: Progressing Goal: Verbalization of feelings and concerns over difficulty with self-care will improve 03/18/2023 0508 by Garrison Columbus, RN Outcome: Progressing 03/18/2023 0507 by Garrison Columbus, RN Outcome: Progressing Goal: Ability to communicate needs accurately will improve 03/18/2023 0508 by Garrison Columbus, RN Outcome: Progressing 03/18/2023 0507 by Garrison Columbus, RN Outcome: Progressing   Problem: Nutrition: Goal: Risk of aspiration will decrease 03/18/2023 0508 by Garrison Columbus, RN Outcome: Progressing 03/18/2023 0507 by Garrison Columbus, RN Outcome: Progressing Goal: Dietary intake will improve 03/18/2023 0508 by Garrison Columbus, RN Outcome: Progressing 03/18/2023 0507 by Garrison Columbus, RN Outcome: Progressing   Problem: Health Behavior/Discharge Planning: Goal: Ability to manage health-related needs will improve 03/18/2023 0508 by Garrison Columbus, RN Outcome: Progressing 03/18/2023 0507 by Garrison Columbus, RN Outcome: Progressing   Problem: Clinical Measurements: Goal: Ability to maintain clinical measurements within normal limits will improve 03/18/2023 0508 by Garrison Columbus, RN Outcome: Progressing 03/18/2023 0507 by Garrison Columbus, RN Outcome: Progressing Goal: Will remain free from infection 03/18/2023 0508 by Garrison Columbus, RN Outcome: Progressing 03/18/2023 0507 by Garrison Columbus, RN Outcome: Progressing Goal: Diagnostic test results will improve 03/18/2023 0508 by Garrison Columbus, RN Outcome: Progressing 03/18/2023 0507 by Garrison Columbus, RN Outcome: Progressing Goal: Respiratory complications will improve 03/18/2023 0508 by Garrison Columbus, RN Outcome: Progressing 03/18/2023 0507 by Garrison Columbus, RN Outcome: Progressing Goal: Cardiovascular complication will be avoided 03/18/2023 0508 by Garrison Columbus, RN Outcome: Progressing 03/18/2023 0507 by Garrison Columbus, RN Outcome: Progressing   Problem: Activity: Goal: Risk for activity intolerance will decrease 03/18/2023 0508 by Garrison Columbus, RN Outcome: Progressing 03/18/2023 0507 by Garrison Columbus, RN Outcome: Progressing   Problem: Nutrition: Goal: Adequate nutrition will be maintained 03/18/2023 0508 by Garrison Columbus, RN Outcome: Progressing 03/18/2023 0507 by  Garrison Columbus, RN Outcome: Progressing   Problem: Coping: Goal: Level of anxiety will decrease 03/18/2023 0508 by Garrison Columbus, RN Outcome: Progressing 03/18/2023 0507 by Garrison Columbus, RN Outcome: Progressing   Problem: Elimination: Goal: Will not experience complications related to bowel motility 03/18/2023 0508 by Garrison Columbus, RN Outcome: Progressing 03/18/2023 0507 by Garrison Columbus, RN Outcome: Progressing Goal: Will not experience complications related to urinary retention 03/18/2023 0508 by Garrison Columbus, RN Outcome: Progressing 03/18/2023 0507 by Garrison Columbus, RN Outcome: Progressing   Problem: Pain Management: Goal: General experience of comfort will improve 03/18/2023 0508 by Garrison Columbus, RN Outcome: Progressing 03/18/2023 0507 by Garrison Columbus, RN Outcome: Progressing   Problem: Safety: Goal: Ability to remain free from injury will improve 03/18/2023 0508 by Garrison Columbus, RN Outcome: Progressing 03/18/2023 0507 by Garrison Columbus, RN Outcome: Progressing   Problem: Skin Integrity: Goal: Risk for impaired skin integrity will decrease 03/18/2023 0508 by Garrison Columbus, RN Outcome: Progressing 03/18/2023 0507 by Garrison Columbus,  RN Outcome: Progressing

## 2023-03-18 NOTE — Anesthesia Preprocedure Evaluation (Addendum)
Anesthesia Evaluation  Patient identified by MRN, date of birth, ID band Patient awake    Reviewed: Allergy & Precautions, NPO status , Patient's Chart, lab work & pertinent test results  History of Anesthesia Complications Negative for: history of anesthetic complications  Airway Mallampati: IV   Neck ROM: Full    Dental  (+) Edentulous Upper, Edentulous Lower   Pulmonary    Pulmonary exam normal breath sounds clear to auscultation       Cardiovascular hypertension, + Peripheral Vascular Disease (AAA)  Normal cardiovascular exam Rhythm:Regular Rate:Normal  ECG 03/11/23: Sinus bradycardia with 1st degree AVB; incomplete RBBB; LAFB   Neuro/Psych HOH CVA (3 weeks ago, and one acute found this admission; left-sided weakness)    GI/Hepatic ,GERD  ,,  Endo/Other  negative endocrine ROS    Renal/GU negative Renal ROS     Musculoskeletal  (+) Arthritis ,    Abdominal   Peds  Hematology negative hematology ROS (+)   Anesthesia Other Findings   Reproductive/Obstetrics                             Anesthesia Physical Anesthesia Plan  ASA: 4  Anesthesia Plan: General   Post-op Pain Management:    Induction: Intravenous  PONV Risk Score and Plan: 2 and Ondansetron, Dexamethasone and Treatment may vary due to age or medical condition  Airway Management Planned: Oral ETT  Additional Equipment: Arterial line  Intra-op Plan:   Post-operative Plan: Extubation in OR  Informed Consent: I have reviewed the patients History and Physical, chart, labs and discussed the procedure including the risks, benefits and alternatives for the proposed anesthesia with the patient or authorized representative who has indicated his/her understanding and acceptance.   Patient has DNR.  Discussed DNR with patient and Suspend DNR.   Dental advisory given  Plan Discussed with: CRNA  Anesthesia Plan  Comments: (Patient and family at bedside consented for risks of anesthesia including but not limited to:  - adverse reactions to medications - damage to eyes, teeth, lips or other oral mucosa - nerve damage due to positioning  - sore throat or hoarseness - damage to heart, brain, nerves, lungs, other parts of body or loss of life  Informed patient about role of CRNA in peri- and intra-operative care.  Patient voiced understanding.)        Anesthesia Quick Evaluation

## 2023-03-18 NOTE — Progress Notes (Addendum)
Physical Therapy Treatment Patient Details Name: Thomas [name] MRN: [id number] DOB: [birth year] Today's Date: 03/18/2023   History of Present Illness Pt is an 89 yo male with incoordination, trouble getting out of bed, and weakness the morning of presentation to the ED. MRI reveals new acute CVA. Per MD note, pt had a "recent CVA 3 weeks ago treated at [facility] was brought to the ED due to generalized weakness and dizziness being off balance x 1 day. Patient came home from [facility] rehab 7 days ago after multifocal small vessel infarcts. 1 night before presentation to [facility] family states the patient did not want to eat dinner and started complaining of dizziness." Other PMH includes HTN, hyperlipidemia, GERD, and B/L carpal tunnel.    PT Comments  Pt was pleasant and motivated to participate during the session and put forth good effort throughout. Pt required min A for bed mobility, mod/Max A at most for STS transfers, and Min A for amb with RW. Pt needing mod/Max A for initial STS transfer from EOB with pt unable to follow cues for forwards weight shift and sequencing. He performed additional STS from chair only needing Min A to complete with pt following VC's and TC's for hand placement, sequencing, and forwards weight shift. He was able to perform x2 amb bouts of ~8 feet with RW needing min A. Multi-modal cues given for gait pattern and RW management, otherwise pt unable to take consistent functional step despite consistent VC's and TC's. Pt VSS, noting no SOB or dizziness during activity. Pt will benefit from continued PT services upon discharge to safely address deficits listed in patient problem list for decreased caregiver assistance and eventual return to PLOF.      If plan is discharge home, recommend the following: Two people to help with walking and/or transfers;Two people to help with bathing/dressing/bathroom;Assistance with cooking/housework;Assistance with feeding;Direct supervision/assist for  medications management;Assist for transportation;Supervision due to cognitive status;Help with stairs or ramp for entrance   Can travel by private vehicle     Yes  Equipment Recommendations  Other (comment) (TBD at next venue)    Recommendations for Other Services       Precautions / Restrictions Precautions Precautions: Fall Restrictions Weight Bearing Restrictions: No     Mobility  Bed Mobility Overal bed mobility: Needs Assistance       Supine to sit: Min assist, Used rails, HOB elevated     General bed mobility comments: Min A with mod verbal cues for sequencing and hand placement, able to initiate and complete half of movement, but needing min A to sit upright    Transfers Overall transfer level: Needs assistance Equipment used: Rolling walker (2 wheels) Transfers: Sit to/from Stand Sit to Stand: Max assist           General transfer comment: x2 STS from different surfaces and heights. Initial STS performed from EOB, Constant VC's provided for forwards weight shift and hand placement, pt making attempts but needing heavy assist and facilitation to perform sequencing with Max/Mod A. Pt able to perform additional STS from chair with repeated VC's as before only needing Min A with correct hand positioning and forwards lean    Ambulation/Gait Ambulation/Gait assistance: Min assist Gait Distance (Feet): 8 Feet Assistive device: Rolling walker (2 wheels) Gait Pattern/deviations: Decreased step length - right, Decreased step length - left, Decreased dorsiflexion - right, Decreased dorsiflexion - left, Decreased stride length, Shuffle, Wide base of support, Steppage Gait velocity: decreased     General Gait  Details: Pt performed x2 short bouts of amb, needing constant one step cues/commands to maintain corrent sequencing and pattern. No overt LOB noted but pt is reliant on RW. repeated cues for postural adjustments. Pt only able to take a few shuflfing steps forwards  without use of VC and TC's. Overall slow and reliant on cues. No SOB noted.   Stairs             Wheelchair Mobility     Tilt Bed    Modified Rankin (Stroke Patients Only)       Balance Overall balance assessment: Needs assistance Sitting-balance support: Feet supported, No upper extremity supported Sitting balance-[name]: Fair     Standing balance support: Bilateral upper extremity supported, Reliant on assistive device for balance, During functional activity Standing balance-[name]: Poor Standing balance comment: Heavy reliance on RW in standing                            Cognition Arousal: Alert Behavior During Therapy: WFL for tasks assessed/performed Overall Cognitive Status: No family/caregiver present to determine baseline cognitive functioning                         Following Commands: Follows one step commands consistently       General Comments: one step commands consistent when kept simple        Exercises Other Exercises Other Exercises: Pt educated on benefits of frequent moblization in order to maintain functional strength    General Comments        Pertinent Vitals/Pain Pain Assessment Pain Assessment: No/denies pain Pain Intervention(s): Monitored during session    Home Living                          Prior Function            PT Goals (current goals can now be found in the care plan section) Progress towards PT goals: Progressing toward goals    Frequency    Min 1X/week      PT Plan      Co-evaluation              AM-PAC PT "6 Clicks" Mobility   Outcome Measure  Help needed turning from your back to your side while in a flat bed without using bedrails?: A Lot Help needed moving from lying on your back to sitting on the side of a flat bed without using bedrails?: A Lot Help needed moving to and from a bed to a chair (including a wheelchair)?: A Lot Help needed  standing up from a chair using your arms (e.g., wheelchair or bedside chair)?: A Lot Help needed to walk in hospital room?: A Lot Help needed climbing 3-5 steps with a railing? : Total 6 Click Score: 11    End of Session Equipment Utilized During Treatment: Gait belt Activity Tolerance: Patient limited by fatigue Patient left: with call bell/phone within reach;in chair;with chair alarm set;with family/visitor present Nurse Communication: Mobility status PT Visit Diagnosis: Unsteadiness on feet (R26.81);Other abnormalities of gait and mobility (R26.89);Repeated falls (R29.6);Muscle weakness (generalized) (M62.81);Hemiplegia and hemiparesis Hemiplegia - Right/Left: Left Hemiplegia - caused by: Cerebral infarction     Time: 1342-1410 PT Time Calculation (min) (ACUTE ONLY): 28 min  Charges:  Cecile Sheerer, SPT 03/18/23, 2:49 PM  This entire session was performed under direct supervision and direction of a licensed therapist/therapist assistant. I have personally read, edited and approve of the note as written.  Loran Senters, DPT

## 2023-03-19 ENCOUNTER — Inpatient Hospital Stay: Payer: Medicare (Managed Care) | Admitting: Anesthesiology

## 2023-03-19 ENCOUNTER — Other Ambulatory Visit: Payer: Self-pay

## 2023-03-19 ENCOUNTER — Encounter
Admission: EM | Disposition: A | Discharge status: Skilled Nursing Facility | Payer: Self-pay | Source: Home / Self Care | Attending: Hospitalist

## 2023-03-19 ENCOUNTER — Encounter: Payer: Self-pay | Admitting: Internal Medicine

## 2023-03-19 DIAGNOSIS — I6521 Occlusion and stenosis of right carotid artery: Secondary | ICD-10-CM

## 2023-03-19 DIAGNOSIS — I771 Stricture of artery: Secondary | ICD-10-CM

## 2023-03-19 DIAGNOSIS — I639 Cerebral infarction, unspecified: Secondary | ICD-10-CM | POA: Diagnosis not present

## 2023-03-19 HISTORY — PX: ENDARTERECTOMY: SHX5162

## 2023-03-19 LAB — MRSA NEXT GEN BY PCR, NASAL: MRSA by PCR Next Gen: NOT DETECTED

## 2023-03-19 LAB — GLUCOSE, CAPILLARY: Glucose-Capillary: 119 mg/dL — ABNORMAL HIGH (ref 70–99)

## 2023-03-19 SURGERY — ENDARTERECTOMY, CAROTID
Anesthesia: General | Site: Neck | Laterality: Right

## 2023-03-19 MED ORDER — PHENYLEPHRINE HCL-NACL 20-0.9 MG/250ML-% IV SOLN
INTRAVENOUS | Status: DC | PRN
  Administered 2023-03-19: 50 ug/min via INTRAVENOUS
  Administered 2023-03-19: 160 ug via INTRAVENOUS

## 2023-03-19 MED ORDER — FIBRIN SEALANT 2 ML SINGLE DOSE KIT
PACK | CUTANEOUS | Status: DC | PRN
  Administered 2023-03-19: 2 mL via TOPICAL

## 2023-03-19 MED ORDER — FENTANYL CITRATE (PF) 100 MCG/2ML IJ SOLN
INTRAMUSCULAR | Status: DC | PRN
  Administered 2023-03-19: 25 ug via INTRAVENOUS
  Administered 2023-03-19: 50 ug via INTRAVENOUS
  Administered 2023-03-19: 25 ug via INTRAVENOUS
  Administered 2023-03-19: 50 ug via INTRAVENOUS
  Administered 2023-03-19: 100 ug via INTRAVENOUS

## 2023-03-19 MED ORDER — SODIUM CHLORIDE 0.9 % IV SOLN: INTRAVENOUS | Status: DC

## 2023-03-19 MED ORDER — FIBRIN SEALANT 2 ML SINGLE DOSE KIT
PACK | CUTANEOUS | Status: AC
Start: 2023-03-19 — End: ?
  Filled 2023-03-19: qty 2

## 2023-03-19 MED ORDER — LIDOCAINE HCL (PF) 1 % IJ SOLN
INTRAMUSCULAR | Status: AC
Start: 2023-03-19 — End: ?
  Filled 2023-03-19: qty 30

## 2023-03-19 MED ORDER — SUGAMMADEX SODIUM 200 MG/2ML IV SOLN
INTRAVENOUS | Status: DC | PRN
  Administered 2023-03-19: 200 mg via INTRAVENOUS

## 2023-03-19 MED ORDER — FENTANYL CITRATE (PF) 100 MCG/2ML IJ SOLN: 25.0000 ug | INTRAMUSCULAR | Status: DC | PRN

## 2023-03-19 MED ORDER — GUAIFENESIN-DM 100-10 MG/5ML PO SYRP: 15.0000 mL | ORAL_SOLUTION | ORAL | Status: DC | PRN

## 2023-03-19 MED ORDER — ACETAMINOPHEN 10 MG/ML IV SOLN
INTRAVENOUS | Status: DC | PRN
  Administered 2023-03-19: 1000 mg via INTRAVENOUS

## 2023-03-19 MED ORDER — CHLORHEXIDINE GLUCONATE CLOTH 2 % EX PADS
6.0000 | MEDICATED_PAD | Freq: Every day | CUTANEOUS | Status: DC
  Administered 2023-03-19 – 2023-04-19 (×31): 6 via TOPICAL

## 2023-03-19 MED ORDER — LACTATED RINGERS IV SOLN: INTRAVENOUS | Status: DC | PRN

## 2023-03-19 MED ORDER — ACETAMINOPHEN 10 MG/ML IV SOLN: 1000.0000 mg | Freq: Once | INTRAVENOUS | Status: DC | PRN

## 2023-03-19 MED ORDER — LACTATED RINGERS IV SOLN: INTRAVENOUS | Status: AC

## 2023-03-19 MED ORDER — CEFAZOLIN SODIUM-DEXTROSE 2-4 GM/100ML-% IV SOLN
2.0000 g | Freq: Three times a day (TID) | INTRAVENOUS | Status: AC
  Administered 2023-03-19 – 2023-03-20 (×2): 2 g via INTRAVENOUS
  Filled 2023-03-19 (×2): qty 100

## 2023-03-19 MED ORDER — HEPARIN SODIUM (PORCINE) 1000 UNIT/ML IJ SOLN
INTRAMUSCULAR | Status: AC
  Filled 2023-03-19: qty 10

## 2023-03-19 MED ORDER — OXYCODONE HCL 5 MG PO TABS
5.0000 mg | ORAL_TABLET | ORAL | Status: DC | PRN
Start: 2023-03-19 — End: 2023-04-19
  Administered 2023-03-29 – 2023-04-19 (×5): 5 mg via ORAL
  Filled 2023-03-19 (×6): qty 1

## 2023-03-19 MED ORDER — OXYCODONE HCL 5 MG PO TABS: 5.0000 mg | ORAL_TABLET | Freq: Once | ORAL | Status: DC | PRN

## 2023-03-19 MED ORDER — ACETAMINOPHEN 650 MG RE SUPP: 325.0000 mg | RECTAL | Status: DC | PRN

## 2023-03-19 MED ORDER — HEPARIN SODIUM (PORCINE) 5000 UNIT/ML IJ SOLN
INTRAMUSCULAR | Status: AC
  Filled 2023-03-19: qty 1

## 2023-03-19 MED ORDER — ONDANSETRON HCL 4 MG/2ML IJ SOLN
4.0000 mg | Freq: Four times a day (QID) | INTRAMUSCULAR | Status: DC | PRN
  Administered 2023-03-21 – 2023-04-13 (×7): 4 mg via INTRAVENOUS
  Filled 2023-03-19 (×8): qty 2

## 2023-03-19 MED ORDER — ONDANSETRON HCL 4 MG/2ML IJ SOLN: 4.0000 mg | Freq: Once | INTRAMUSCULAR | Status: DC | PRN

## 2023-03-19 MED ORDER — FENTANYL CITRATE PF 50 MCG/ML IJ SOSY: 12.5000 ug | PREFILLED_SYRINGE | Freq: Once | INTRAMUSCULAR | Status: DC | PRN

## 2023-03-19 MED ORDER — PANTOPRAZOLE SODIUM 40 MG IV SOLR
40.0000 mg | Freq: Every day | INTRAVENOUS | Status: DC
  Administered 2023-03-19: 40 mg via INTRAVENOUS
  Filled 2023-03-19: qty 10

## 2023-03-19 MED ORDER — FENTANYL CITRATE (PF) 250 MCG/5ML IJ SOLN
INTRAMUSCULAR | Status: AC
  Filled 2023-03-19: qty 5

## 2023-03-19 MED ORDER — PROPOFOL 1000 MG/100ML IV EMUL
INTRAVENOUS | Status: AC
  Filled 2023-03-19: qty 100

## 2023-03-19 MED ORDER — ALUM & MAG HYDROXIDE-SIMETH 200-200-20 MG/5ML PO SUSP
15.0000 mL | ORAL | Status: DC | PRN
Start: 2023-03-19 — End: 2023-04-19

## 2023-03-19 MED ORDER — SODIUM CHLORIDE 0.9 % IV SOLN: 500.0000 mL | Freq: Once | INTRAVENOUS | Status: DC | PRN

## 2023-03-19 MED ORDER — ROCURONIUM BROMIDE 100 MG/10ML IV SOLN
INTRAVENOUS | Status: DC | PRN
  Administered 2023-03-19 (×2): 20 mg via INTRAVENOUS
  Administered 2023-03-19: 40 mg via INTRAVENOUS

## 2023-03-19 MED ORDER — DEXAMETHASONE SODIUM PHOSPHATE 10 MG/ML IJ SOLN
INTRAMUSCULAR | Status: DC | PRN
  Administered 2023-03-19: 10 mg via INTRAVENOUS

## 2023-03-19 MED ORDER — ONDANSETRON HCL 4 MG/2ML IJ SOLN: 4.0000 mg | Freq: Four times a day (QID) | INTRAMUSCULAR | Status: DC | PRN

## 2023-03-19 MED ORDER — SODIUM CHLORIDE 0.9 % IV SOLN
INTRAVENOUS | Status: DC | PRN
  Administered 2023-03-19: 500 mL

## 2023-03-19 MED ORDER — SORBITOL 70 % SOLN
30.0000 mL | Freq: Every day | Status: DC | PRN
  Filled 2023-03-19: qty 30

## 2023-03-19 MED ORDER — OXYCODONE HCL 5 MG/5ML PO SOLN: 5.0000 mg | Freq: Once | ORAL | Status: DC | PRN

## 2023-03-19 MED ORDER — DOCUSATE SODIUM 100 MG PO CAPS
100.0000 mg | ORAL_CAPSULE | Freq: Every day | ORAL | Status: DC
  Administered 2023-03-20 – 2023-04-11 (×21): 100 mg via ORAL
  Filled 2023-03-19 (×24): qty 1

## 2023-03-19 MED ORDER — MORPHINE SULFATE (PF) 2 MG/ML IV SOLN
2.0000 mg | INTRAVENOUS | Status: DC | PRN
Start: 2023-03-19 — End: 2023-03-21

## 2023-03-19 MED ORDER — LIDOCAINE HCL (CARDIAC) PF 100 MG/5ML IV SOSY
PREFILLED_SYRINGE | INTRAVENOUS | Status: DC | PRN
  Administered 2023-03-19: 60 mg via INTRAVENOUS

## 2023-03-19 MED ORDER — ONDANSETRON HCL 4 MG/2ML IJ SOLN
INTRAMUSCULAR | Status: DC | PRN
  Administered 2023-03-19: 4 mg via INTRAVENOUS

## 2023-03-19 MED ORDER — METOPROLOL TARTRATE 5 MG/5ML IV SOLN: 2.0000 mg | INTRAVENOUS | Status: DC | PRN

## 2023-03-19 MED ORDER — HEPARIN SODIUM (PORCINE) 1000 UNIT/ML IJ SOLN
INTRAMUSCULAR | Status: DC | PRN
  Administered 2023-03-19: 7000 [IU] via INTRAVENOUS

## 2023-03-19 MED ORDER — ACETAMINOPHEN 325 MG PO TABS
325.0000 mg | ORAL_TABLET | ORAL | Status: DC | PRN
  Administered 2023-03-19: 325 mg via ORAL
  Administered 2023-03-29 – 2023-04-19 (×5): 650 mg via ORAL
  Filled 2023-03-19 (×2): qty 2
  Filled 2023-03-19: qty 1
  Filled 2023-03-19 (×3): qty 2

## 2023-03-19 MED ORDER — GLYCOPYRROLATE 0.2 MG/ML IJ SOLN
INTRAMUSCULAR | Status: DC | PRN
  Administered 2023-03-19: .2 mg via INTRAVENOUS

## 2023-03-19 MED ORDER — PHENOL 1.4 % MT LIQD: 1.0000 | OROMUCOSAL | Status: DC | PRN

## 2023-03-19 MED ORDER — PROPOFOL 10 MG/ML IV BOLUS
INTRAVENOUS | Status: DC | PRN
  Administered 2023-03-19: 100 mg via INTRAVENOUS
  Administered 2023-03-19: 100 ug/kg/min via INTRAVENOUS

## 2023-03-19 MED ORDER — LABETALOL HCL 5 MG/ML IV SOLN
10.0000 mg | INTRAVENOUS | Status: AC | PRN
  Administered 2023-03-19 – 2023-04-12 (×4): 10 mg via INTRAVENOUS
  Filled 2023-03-19 (×4): qty 4

## 2023-03-19 SURGICAL SUPPLY — 75 items
5 PAIRS OF YELLOW SUTURE CLAMP (MISCELLANEOUS) ×2
ADH SKN CLS APL DERMABOND .7 (GAUZE/BANDAGES/DRESSINGS) ×1
APL PRP STRL LF DISP 70% ISPRP (MISCELLANEOUS) ×1
APPLIER CLIP 11 MED OPEN (CLIP)
APPLIER CLIP 9.375 SM OPEN (CLIP)
APR CLP MED 11 20 MLT OPN (CLIP)
APR CLP SM 9.3 20 MLT OPN (CLIP)
BAG DECANTER FOR FLEXI CONT (MISCELLANEOUS) ×1 IMPLANT
BLADE SURG 15 STRL LF DISP TIS (BLADE) ×1 IMPLANT
BLADE SURG 15 STRL SS (BLADE) ×1
BLADE SURG SZ11 CARB STEEL (BLADE) ×1 IMPLANT
BRUSH SCRUB EZ 4% CHG (MISCELLANEOUS) ×1 IMPLANT
CHLORAPREP W/TINT 26 (MISCELLANEOUS) ×1 IMPLANT
CLAMP SUTURE YELLOW 5 PAIRS (MISCELLANEOUS) ×2 IMPLANT
CLIP APPLIE 11 MED OPEN (CLIP) IMPLANT
CLIP APPLIE 9.375 SM OPEN (CLIP) IMPLANT
DERMABOND ADVANCED .7 DNX12 (GAUZE/BANDAGES/DRESSINGS) ×1 IMPLANT
DRAPE INCISE IOBAN 66X45 STRL (DRAPES) ×1 IMPLANT
DRAPE LAPAROTOMY 100X77 ABD (DRAPES) ×1 IMPLANT
DRAPE SHEET LG 3/4 BI-LAMINATE (DRAPES) IMPLANT
DRESSING SURGICEL FIBRLLR 1X2 (HEMOSTASIS) ×1 IMPLANT
DRSG SURGICEL FIBRILLAR 1X2 (HEMOSTASIS) ×1
ELECT CAUTERY BLADE 6.4 (BLADE) ×1 IMPLANT
ELECT REM PT RETURN 9FT ADLT (ELECTROSURGICAL) ×1
ELECTRODE REM PT RTRN 9FT ADLT (ELECTROSURGICAL) ×1 IMPLANT
GAUZE 4X4 16PLY ~~LOC~~+RFID DBL (SPONGE) IMPLANT
GLOVE BIO SURGEON STRL SZ7 (GLOVE) ×1 IMPLANT
GLOVE SURG SYN 8.0 (GLOVE) ×1 IMPLANT
GLOVE SURG SYN 8.0 PF PI (GLOVE) ×1 IMPLANT
GOWN STRL REUS W/ TWL LRG LVL3 (GOWN DISPOSABLE) IMPLANT
GOWN STRL REUS W/ TWL XL LVL3 (GOWN DISPOSABLE) ×2 IMPLANT
GOWN STRL REUS W/TWL LRG LVL3 (GOWN DISPOSABLE)
GOWN STRL REUS W/TWL XL LVL3 (GOWN DISPOSABLE) ×2
HOLDER FOLEY CATH W/STRAP (MISCELLANEOUS) ×1 IMPLANT
IV NS 500ML (IV SOLUTION) ×1
IV NS 500ML BAXH (IV SOLUTION) ×1 IMPLANT
KIT TURNOVER KIT A (KITS) ×1 IMPLANT
LABEL OR SOLS (LABEL) ×1 IMPLANT
LINER WHITE 40X46 (LINER) IMPLANT
LOOP VESSEL MAXI 1X406 RED (MISCELLANEOUS) ×2 IMPLANT
LOOP VESSEL MINI 0.8X406 BLUE (MISCELLANEOUS) ×1 IMPLANT
MANIFOLD NEPTUNE II (INSTRUMENTS) ×1 IMPLANT
NDL FILTER BLUNT 18X1 1/2 (NEEDLE) ×1 IMPLANT
NDL HYPO 25X1 1.5 SAFETY (NEEDLE) ×1 IMPLANT
NEEDLE FILTER BLUNT 18X1 1/2 (NEEDLE) IMPLANT
NEEDLE HYPO 25X1 1.5 SAFETY (NEEDLE) ×1 IMPLANT
NS IRRIG 500ML POUR BTL (IV SOLUTION) ×1 IMPLANT
PACK BASIN MAJOR ARMC (MISCELLANEOUS) ×1 IMPLANT
PATCH VASC XENOSURE .8CM X 8CM (Vascular Products) IMPLANT
SET WALTER ACTIVATION W/DRAPE (SET/KITS/TRAYS/PACK) IMPLANT
SHUNT CAROTID STR REINF 3.0X4. (MISCELLANEOUS) ×1 IMPLANT
SPONGE T-LAP 18X18 ~~LOC~~+RFID (SPONGE) IMPLANT
SUT MNCRL+ 5-0 UNDYED PC-3 (SUTURE) ×1 IMPLANT
SUT PROLENE 5-0 (SUTURE) ×1
SUT PROLENE 5-0 BB 36X2 ARM (SUTURE) ×1
SUT PROLENE 6 0 BV (SUTURE) ×8 IMPLANT
SUT PROLENE 7 0 BV 1 (SUTURE) ×6 IMPLANT
SUT SILK 2 0 (SUTURE) ×1
SUT SILK 2-0 18XBRD TIE 12 (SUTURE) ×1 IMPLANT
SUT SILK 3 0 (SUTURE) ×1
SUT SILK 3-0 18XBRD TIE 12 (SUTURE) ×1 IMPLANT
SUT SILK 4 0 (SUTURE) ×1
SUT SILK 4-0 18XBRD TIE 12 (SUTURE) ×1 IMPLANT
SUT VIC AB 3-0 SH 27 (SUTURE) ×1
SUT VIC AB 3-0 SH 27X BRD (SUTURE) ×1 IMPLANT
SUTURE PROLEN 5-0 BB 36X2 ARM (SUTURE) ×2 IMPLANT
SYR 10ML LL (SYRINGE) ×1 IMPLANT
SYR 20ML LL LF (SYRINGE) ×1 IMPLANT
SYR 3ML LL SCALE MARK (SYRINGE) ×1 IMPLANT
TAG SUTURE CLAMP YLW 5PR (MISCELLANEOUS) ×2
TRAP FLUID SMOKE EVACUATOR (MISCELLANEOUS) ×1 IMPLANT
TRAY FOLEY MTR SLVR 16FR STAT (SET/KITS/TRAYS/PACK) ×1 IMPLANT
TUBING CONNECTING 10 (TUBING) IMPLANT
VASC PATCH XENOSURE .8CM X 8CM (Vascular Products) ×1 IMPLANT
WATER STERILE IRR 500ML POUR (IV SOLUTION) ×1 IMPLANT

## 2023-03-19 NOTE — Anesthesia Procedure Notes (Signed)
Procedure Name: Intubation Date/Time: 03/19/2023 8:20 AM  Performed by: Maryla Morrow., CRNAPre-anesthesia Checklist: Patient identified, Patient being monitored, Timeout performed, Emergency Drugs available and Suction available Patient Re-evaluated:Patient Re-evaluated prior to induction Oxygen Delivery Method: Circle system utilized Preoxygenation: Pre-oxygenation with 100% oxygen Induction Type: IV induction Ventilation: Mask ventilation without difficulty Laryngoscope Size: McGraph and 4 Grade View: Grade I Tube type: Oral Tube size: 7.5 mm Number of attempts: 1 Airway Equipment and Method: Stylet Placement Confirmation: ETT inserted through vocal cords under direct vision, positive ETCO2 and breath sounds checked- equal and bilateral Secured at: 25 cm Tube secured with: Tape Dental Injury: Teeth and Oropharynx as per pre-operative assessment

## 2023-03-19 NOTE — Anesthesia Procedure Notes (Signed)
Arterial Line Insertion Start/End11/12/2022 7:54 AM, 03/19/2023 7:59 AM Performed by: Reed Breech, MD, anesthesiologist  Patient location: OR. Preanesthetic checklist: patient identified, IV checked, site marked, risks and benefits discussed, surgical consent, monitors and equipment checked, pre-op evaluation, timeout performed and anesthesia consent Patient sedated Left, radial was placed Catheter size: 20 G Hand hygiene performed  and maximum sterile barriers used   Attempts: 1 Procedure performed using ultrasound guided technique. Ultrasound Notes:anatomy identified, needle tip was noted to be adjacent to the nerve/plexus identified and no ultrasound evidence of intravascular and/or intraneural injection Following insertion, dressing applied. Post procedure assessment: normal and unchanged  Patient tolerated the procedure well with no immediate complications.

## 2023-03-19 NOTE — Anesthesia Postprocedure Evaluation (Signed)
Anesthesia Post Note  Patient: Thomas [name]  Procedure(s) Performed: ENDARTERECTOMY CAROTID (Right: Neck)  Patient location during evaluation: PACU Anesthesia Type: General Level of consciousness: awake and alert Pain management: pain level controlled Vital Signs Assessment: post-procedure vital signs reviewed and stable Respiratory status: spontaneous breathing, nonlabored ventilation, respiratory function stable and patient connected to nasal cannula oxygen Cardiovascular status: blood pressure returned to baseline and stable Postop Assessment: no apparent nausea or vomiting Anesthetic complications: no   There were no known notable events for this encounter.   Last Vitals:  Vitals:   03/19/23 1245 03/19/23 1300  BP: 127/84   Pulse: 76 78  Resp: 19 17  Temp:    SpO2: 97% 98%    Last Pain:  Vitals:   03/19/23 1245  TempSrc:   PainSc: 0-No pain                 Louie Boston

## 2023-03-19 NOTE — Transfer of Care (Signed)
Immediate Anesthesia Transfer of Care Note  Patient: Thomas [name]  Procedure(s) Performed: ENDARTERECTOMY CAROTID (Right: Neck)  Patient Location: PACU  Anesthesia Type:General  Level of Consciousness: drowsy and patient cooperative  Airway & Oxygen Therapy: Patient Spontanous Breathing and Patient connected to face mask oxygen  Post-op Assessment: Report given to RN and Post -op Vital signs reviewed and stable  Post vital signs: stable  Last Vitals:  Vitals Value Taken Time  BP 125/72 03/19/23 1110  Temp    Pulse 75 03/19/23 1115  Resp 15 03/19/23 1115  SpO2 97 % 03/19/23 1115  Vitals shown include unfiled device data.  Last Pain:  Vitals:   03/19/23 0657  TempSrc:   PainSc: 0-No pain      Patients Stated Pain Goal: 0 (03/17/23 2146)  Complications: No notable events documented.

## 2023-03-19 NOTE — Op Note (Signed)
[facility]   OPERATIVE NOTE  PROCEDURE:   1.  Right carotid endarterectomy with bovine pericardial patch reconstruction. 2.  Resection of a right internal carotid artery kink with removal of 20 mm of internal carotid artery and direct reanastomosis to the bulb  PRE-OPERATIVE DIAGNOSIS: 1.  Critical carotid stenosis 2.  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  Extensive calcified carotid plaque.  SPECIMEN(S):  Carotid plaque (sent to Pathology)  INDICATIONS:   Thomas [name] is a 89 y.o. y.o. male who presents with symptomatic right carotid stenosis of 90%.  This is also associated with significant kinking of the internal carotid artery with marked redundancy.  The risks, benefits, and alternatives to carotid endarterectomy were discussed with the patient. The differences between carotid stenting and carotid endarterectomy were reviewed.  The patient voiced understanding and appears to be aware that the risks of carotid endarterectomy include but are not limited to: bleeding, infection, stroke, myocardial infarction, death, cranial nerve injuries both temporary and permanent, neck hematoma, possible airway compromise, labile blood pressure post-operatively, cerebral hyperperfusion syndrome, and possible need for additional interventions in the future. The patient is aware of the risks and agrees to proceed forward with the procedure.  DESCRIPTION: After full informed written consent was obtained from the patient, the patient was brought back to the operating room and placed supine upon the operating table.  Prior to induction, the patient received IV antibiotics.  After obtaining adequate anesthesia, the patient was placed a supine position with a shoulder roll in  place and the patient's neck slightly hyperextended and rotated away from the surgical site.  The patient was prepped in the standard fashion for a carotid endarterectomy.    A first assistant was required to provide a safe and appropriate environment for executing the surgery.  The assistant was integral in providing retraction, exposure, running suture providing suction and in the closing process.  The incision was made anterior to the sternocleidomastoid muscle and dissected down through the subcutaneous tissue.  The platysmas was opened with electrocautery.  The internal jugular vein and facial vein were identified.  The facial vein is ligated and divided between 2-0 silk ties.  The omohyoid was identified in the common carotid artery exposed at this level. The dissection was there in carried out along the carotid artery in a cranial direction.  The dissection was then carried along periadventitial plane along the common carotid artery up to the bifurcation. The external carotid artery was identified. Vessel loops were then placed around the external carotid artery as well as the superior thyroid artery. In the process of this dissection, the hypoglossal nerve was identified and protected from harm.  As I carried the dissection along the internal carotid artery I noticed that there appeared to be significant plaque formation in the internal extending up to the first 180 degree turn.  I was fully aware that the patient had not 1 but two 180 degree turns and then what appeared to be approximately a 270 degree curl just distal to that.  This was evident on the CT angio.  However the CT angio suggested that the 2 cm leading up to the first band were free of plaque and it was now quite apparent that this was not the case.  I therefore  had to perform extensive dissection reducing the first 2 turns and straightening out the internal carotid artery until I had an area that was free of plaque.  The internal carotid  artery was then dissected circumferentially just beyond an area in the internal carotid artery distal to the plaque.  Given the significant redundancy that now reduced it was obvious that he would need to have a segment of internal carotid artery resected.  At this point, we gave the patient 7000 units of intravenous heparin.  After this was allowed to circulate for several minutes, the common carotid followed by the external carotid and then the internal carotid artery were clamped.  Arteriotomy was made in the common carotid artery with a 11 blade, and extended the arteriotomy with a Potts scissor down into the common carotid artery, then the arteriotomy was carried through the bifurcation into the internal carotid artery.  However, I was not able to easily advance the shunt dealing with the redundancy and the kinks of the internal carotid artery and ultimately before placing the shunt resected approximately 20 mm of internal carotid artery.  This allowed me to grab the end of the internal carotid artery and retracted in a proximal direction straightening it out and allow me to place the Sundt shunt..  At this point, a Sundt shunt was placed without further difficulty.  The endarterectomy was begun in the common carotid artery with a Runner, broadcasting/film/video and carried this dissection down into the common carotid artery circumferentially.  Then I transected the plaque at a segment where it was adherent using Potts scissors.  I then carried this dissection up into the external carotid artery.  The plaque was extracted by unclamping the external carotid artery and performing an eversion endarterectomy.  The dissection was then carried into the origin internal carotid artery where the plaque was removed in its entirety.  The more distal internal carotid artery at the level of the the transection was free of plaque and therefore the intimal edge was flush.  The distal internal carotid artery was then approximated to the  origin of the internal carotid artery and the bulb trimmed to a more appropriate with using Potts scissors.  2 interrupted 6-0 Prolene's were then placed at the edges to line up the carotid arteries and then the internal carotid artery was reanastomosed using a running 6-0 Prolene suture.  The plaque was passed off the field as a specimen.   A bovine pericardial patch was delivered onto the field and trimmed appropriately for the artery and sewed in place with 6-0 Prolene using a 4 quadrant technique.  The medial suture line was completed and the lateral suture line was run approximately one quarter the length of the arteriotomy.  Prior to completing this patch angioplasty, the shunt was removed, the internal carotid artery was flushed and there was excellent backbleeding.  The carotid artery repair was flushed with heparinized saline and then the patch angioplasty was completed in the usual fashion.  The flow was then reestablished first to the external carotid artery and then the internal carotid artery to prevent distal embolization.   Several minutes of pressure were held and 6-0 Prolene patch sutures were used as need for hemostasis.  At this point, I placed Surgicel and Vistaseal topical hemostatic agents.  There was no more active bleeding in the surgical site.  The sternocleidomastoid space was closed with three interrupted 3-0 Vicryl sutures. I then reapproximated the platysma muscle with a running stitch of 3-0  Vicryl.  The skin was then closed with a running subcuticular 4-0 Monocryl.  The skin was then cleaned, dried and Dermabond was used to reinforce the skin closure.  The patient awakened and was taken to the recovery room in stable condition, following commands and moving all four extremities without any apparent deficits.    COMPLICATIONS: none  CONDITION: stable  Levora Dredge 03/19/2023<11:01 AM

## 2023-03-19 NOTE — Progress Notes (Signed)
  PROGRESS NOTE    Thomas [name]  FIE:[id number] DOB: 05/21/1930 DOA: 03/11/2023 PCP: Barbette Reichmann, MD  [id number]/[id number]  LOS: 3 days   Brief hospital course:   Assessment & Plan:  89 y.o. male with medical history significant of hypertension, hyperlipidemia, GERD, recent CVA 3 weeks ago treated at [facility] was brought to the ED due to generalized weakness and dizziness being off balance x 1 day.  Patient came home from [facility] rehab 7 days ago after multifocal small vessel infarcts.  Was doing well at home.  Ambulating on his own with good functional status, able to perform basic ADLs.   1 night before presentation to [facility] family states the patient did not want to eat dinner and started complaining of dizziness.  The morning of presentation patient was having trouble with coordination unable to raise himself out of bed.  No evidence of trauma.   On presentation to the ED the patient is hemodynamically stable.  MRI reveals new acute CVA.  On my interview the patient is resting comfortably in bed.  Son-in-law at bedside.  Primary caregiver.  States that the patient was discharged on dual antiplatelet therapy aspirin and Plavix and has been compliant with all medications.   11/1: Patient seen in consultation by neurology who ordered CTA to evaluate carotid anatomy.  Pending CTA results and possible vascular surgery consult.  Pending PT and OT evaluations.   11/2: Case discussed at length with neurology.  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  Vascular surgery engaged for comment consideration of carotid endarterectomy   Acute CVA Patient presents 3 weeks after previous hospitalization for stroke and discharged from [facility].  Now with evidence of new acute CVA on imaging.  Patient son-in-law at bedside reports compliance with dual antiplatelet therapy and antihyperlipidemic therapy at home. Seen in consultation  by neurology Concerned that recurrent strokes within the same vascular territory are secondary to calcium emboli --LDL 87.   Plan: --cont ASA and plavix Continue statin --Right carotid endarterectomy today.   Essential hypertension Blood pressure controlled Normotensive blood pressure goals   Hyperlipidemia --LDL 87 --cont statin   GERD --cont PPI   DVT prophylaxis: Lovenox SQ Code Status: DNR  Family Communication:  Level of care: ICU Dispo:   The patient is from: home Anticipated d/c is to: SNF rehab Anticipated d/c date is: > 3 days   Subjective and Interval History:  Pt underwent Right carotid endarterectomy today.   Objective: Vitals:   03/19/23 1530 03/19/23 1548 03/19/23 1600 03/19/23 1630  BP: 129/63  131/85 126/85  Pulse: (!) 43 (!) 44 62 79  Resp: 12 11 12 17   Temp: 98.9 F (37.2 C)   98.2 F (36.8 C)  TempSrc:    Oral  SpO2: 95% 95% 95% 95%  Height:        Intake/Output Summary (Last 24 hours) at 03/19/2023 1704 Last data filed at 03/19/2023 1630 Gross per 24 hour  Intake 1043.34 ml  Output 1205 ml  Net -161.66 ml   There were no vitals filed for this visit.  Examination:   Constitutional: NAD, alert HEENT: conjunctivae and lids normal, EOMI CV: No cyanosis.   RESP: normal respiratory effort, on RA   Data Reviewed: I have personally reviewed labs and imaging studies  Time spent: 35 minutes  [name], MD [facility] If 7PM-7AM, please contact night-coverage 03/19/2023, 5:04 PM

## 2023-03-19 NOTE — Progress Notes (Signed)
Symmetrical facial expression. Pupils equal and reactive. Responds to voice and follows commands but has not used any verbal expression as of yet. R hand grip strong and able to wiggle toes and move leg on r side. L side much more sluggish in comparison. Barely  able to grip hand or move l leg or foot much. Per Dr Gilda Crease, family states he had some weakness on left side prior to surgery. Has had 2 prior strokes.

## 2023-03-19 NOTE — Plan of Care (Signed)
  Problem: Education: Goal: Knowledge of disease or condition will improve Outcome: Not Progressing Goal: Knowledge of secondary prevention will improve (MUST DOCUMENT ALL) Outcome: Not Progressing Goal: Knowledge of patient specific risk factors will improve Loraine Leriche N/A or DELETE if not current risk factor) Outcome: Not Progressing   Problem: Ischemic Stroke/TIA Tissue Perfusion: Goal: Complications of ischemic stroke/TIA will be minimized Outcome: Not Progressing   Problem: Coping: Goal: Will verbalize positive feelings about self Outcome: Not Progressing Goal: Will identify appropriate support needs Outcome: Not Progressing   Problem: Health Behavior/Discharge Planning: Goal: Ability to manage health-related needs will improve Outcome: Not Progressing Goal: Goals will be collaboratively established with patient/family Outcome: Not Progressing   Problem: Self-Care: Goal: Ability to participate in self-care as condition permits will improve Outcome: Not Progressing Goal: Verbalization of feelings and concerns over difficulty with self-care will improve Outcome: Not Progressing Goal: Ability to communicate needs accurately will improve Outcome: Not Progressing   Problem: Nutrition: Goal: Risk of aspiration will decrease Outcome: Not Progressing Goal: Dietary intake will improve Outcome: Not Progressing   Problem: Education: Goal: Knowledge of General Education information will improve Description: Including pain rating scale, medication(s)/side effects and non-pharmacologic comfort measures Outcome: Not Progressing   Problem: Health Behavior/Discharge Planning: Goal: Ability to manage health-related needs will improve Outcome: Not Progressing   Problem: Clinical Measurements: Goal: Ability to maintain clinical measurements within normal limits will improve Outcome: Not Progressing Goal: Will remain free from infection Outcome: Not Progressing Goal: Diagnostic test  results will improve Outcome: Not Progressing Goal: Respiratory complications will improve Outcome: Not Progressing Goal: Cardiovascular complication will be avoided Outcome: Not Progressing   Problem: Activity: Goal: Risk for activity intolerance will decrease Outcome: Not Progressing   Problem: Nutrition: Goal: Adequate nutrition will be maintained Outcome: Not Progressing   Problem: Coping: Goal: Level of anxiety will decrease Outcome: Not Progressing   Problem: Elimination: Goal: Will not experience complications related to bowel motility Outcome: Not Progressing Goal: Will not experience complications related to urinary retention Outcome: Not Progressing   Problem: Pain Management: Goal: General experience of comfort will improve Outcome: Not Progressing   Problem: Safety: Goal: Ability to remain free from injury will improve Outcome: Not Progressing   Problem: Skin Integrity: Goal: Risk for impaired skin integrity will decrease Outcome: Not Progressing   Problem: Education: Goal: Knowledge of discharge needs will improve Outcome: Not Progressing   Problem: Clinical Measurements: Goal: Postoperative complications will be avoided or minimized Outcome: Not Progressing   Problem: Respiratory: Goal: Ability to achieve and maintain a regular respiratory rate will improve Outcome: Not Progressing   Problem: Skin Integrity: Goal: Demonstration of wound healing without infection will improve Outcome: Not Progressing

## 2023-03-19 NOTE — Progress Notes (Signed)
OT Cancellation Note  Patient Details Name: BETH BEDSWORTH MRN: [id number] DOB: 07/15/1930   Cancelled Treatment:    Reason Eval/Treat Not Completed: Patient at procedure or test/ unavailable. Pt noted to be off the floor for procedure, unavailable at this time. Will continue to follow POC at later date/time as pt available.   Kathie Dike, M.S. OTR/L  03/19/23, 2:13 PM  ascom [phone]

## 2023-03-19 NOTE — Progress Notes (Signed)
Patient transported off the floor to pre-op room on stretcher, family present. Antibiotic given to transport personnel. Patient felt the urge to urinate but was unable to do so.

## 2023-03-19 NOTE — Interval H&P Note (Signed)
History and Physical Interval Note:  03/19/2023 7:21 AM  Thomas [name]  has presented today for surgery, with the diagnosis of Carotid Stenosis.  The various methods of treatment have been discussed with the patient and family. After consideration of risks, benefits and other options for treatment, the patient has consented to  Procedure(s): ENDARTERECTOMY CAROTID (Right) as a surgical intervention.  The patient's history has been reviewed, patient examined, no change in status, stable for surgery.  I have reviewed the patient's chart and labs.  Questions were answered to the patient's satisfaction.     Levora Dredge

## 2023-03-20 DIAGNOSIS — I639 Cerebral infarction, unspecified: Secondary | ICD-10-CM | POA: Diagnosis not present

## 2023-03-20 LAB — CBC
HCT: 36.7 % — ABNORMAL LOW (ref 39.0–52.0)
Hemoglobin: 12.4 g/dL — ABNORMAL LOW (ref 13.0–17.0)
MCH: 30.5 pg (ref 26.0–34.0)
MCHC: 33.8 g/dL (ref 30.0–36.0)
MCV: 90.4 fL (ref 80.0–100.0)
Platelets: 218 10*3/uL (ref 150–400)
RBC: 4.06 MIL/uL — ABNORMAL LOW (ref 4.22–5.81)
RDW: 13.4 % (ref 11.5–15.5)
WBC: 8.3 10*3/uL (ref 4.0–10.5)
nRBC: 0 % (ref 0.0–0.2)

## 2023-03-20 LAB — BASIC METABOLIC PANEL
Anion gap: 8 (ref 5–15)
BUN: 12 mg/dL (ref 8–23)
CO2: 21 mmol/L — ABNORMAL LOW (ref 22–32)
Calcium: 8.1 mg/dL — ABNORMAL LOW (ref 8.9–10.3)
Chloride: 108 mmol/L (ref 98–111)
Creatinine, Ser: 0.97 mg/dL (ref 0.61–1.24)
GFR, Estimated: >60 mL/min (ref >60)
Glucose, Bld: 109 mg/dL — ABNORMAL HIGH (ref 70–99)
Potassium: 3.7 mmol/L (ref 3.5–5.1)
Sodium: 137 mmol/L (ref 135–145)

## 2023-03-20 LAB — MAGNESIUM: Magnesium: 1.9 mg/dL (ref 1.7–2.4)

## 2023-03-20 MED ORDER — PANTOPRAZOLE SODIUM 40 MG PO TBEC
40.0000 mg | DELAYED_RELEASE_TABLET | Freq: Every day | ORAL | Status: DC
  Administered 2023-03-20 – 2023-04-18 (×29): 40 mg via ORAL
  Filled 2023-03-20 (×30): qty 1

## 2023-03-20 MED ORDER — SODIUM CHLORIDE 0.9% FLUSH
3.0000 mL | Freq: Two times a day (BID) | INTRAVENOUS | Status: DC
  Administered 2023-03-20 – 2023-04-18 (×56): 3 mL via INTRAVENOUS

## 2023-03-20 NOTE — Plan of Care (Signed)
  Problem: Education: Goal: Knowledge of patient specific risk factors will improve Thomas [name] N/A or DELETE if not current risk factor) Outcome: Progressing   Problem: Ischemic Stroke/TIA Tissue Perfusion: Goal: Complications of ischemic stroke/TIA will be minimized Outcome: Progressing   Problem: Coping: Goal: Will verbalize positive feelings about self Outcome: Progressing Goal: Will identify appropriate support needs Outcome: Progressing   Problem: Health Behavior/Discharge Planning: Goal: Ability to manage health-related needs will improve Outcome: Progressing Goal: Goals will be collaboratively established with patient/family Outcome: Progressing   Problem: Education: Goal: Knowledge of secondary prevention will improve (MUST DOCUMENT ALL) Outcome: Not Progressing

## 2023-03-20 NOTE — Progress Notes (Signed)
[facility]  Daily Progress Note   Subjective  -   POD#1 right CEA.  Had a good evening with baseline left side weakness (pre-op) perhaps slightly worse post-op but functionally the same.  Ate dinner.  Confused overnight but speaks fluently.  Has a right gaze preference but can cross midline.  Neck with ecchymosis on right surgical site but no hematoma.  No drainage.  Motor and sensory seems fairly symmetric.  He has no complaints.  Objective Vitals:   03/20/23 0330 03/20/23 0600 03/20/23 0700 03/20/23 0800  BP:  118/67 134/82 134/73  Pulse:  77 80 71  Resp:  (!) 23 20 20   Temp: 98.8 F (37.1 C)     TempSrc: Oral     SpO2: 97% 95% 97% 96%  Height:        Intake/Output Summary (Last 24 hours) at 03/20/2023 1610 Last data filed at 03/20/2023 0657 Gross per 24 hour  Intake 1941.44 ml  Output 1630 ml  Net 311.44 ml    PULM  CTAB CV  RRR VASC  No wound complications apparent.  Laboratory CBC    Component Value Date/Time   WBC 8.3 03/20/2023 0412   HGB 12.4 (L) 03/20/2023 0412   HGB 10.6 (L) 10/28/2011 0622   HCT 36.7 (L) 03/20/2023 0412   HCT 40.5 10/19/2011 0819   PLT 218 03/20/2023 0412   PLT 161 10/28/2011 0622    BMET    Component Value Date/Time   NA 137 03/20/2023 0412   NA 140 10/28/2011 0622   K 3.7 03/20/2023 0412   K 4.1 10/28/2011 0622   CL 108 03/20/2023 0412   CL 108 (H) 10/28/2011 0622   CO2 21 (L) 03/20/2023 0412   CO2 24 10/28/2011 0622   GLUCOSE 109 (H) 03/20/2023 0412   GLUCOSE 81 10/28/2011 0622   BUN 12 03/20/2023 0412   BUN 17 10/28/2011 0622   CREATININE 0.97 03/20/2023 0412   CREATININE 1.12 10/28/2011 0622   CALCIUM 8.1 (L) 03/20/2023 0412   CALCIUM 7.9 (L) 10/28/2011 0622   GFRNONAA >60 03/20/2023 0412   GFRNONAA >60 10/28/2011 0622   GFRAA >60 10/28/2011 0622    Assessment/Planning: POD #1 s/p R CEA with complex anatomy  Advance diet and OOB to chair today.  BP control and neuro checks.   Iline Oven  03/20/2023, 8:22 AM

## 2023-03-20 NOTE — Plan of Care (Signed)
  Problem: Education: Goal: Knowledge of disease or condition will improve Outcome: Progressing   Problem: Ischemic Stroke/TIA Tissue Perfusion: Goal: Complications of ischemic stroke/TIA will be minimized Outcome: Progressing   Problem: Coping: Goal: Will verbalize positive feelings about self Outcome: Progressing Goal: Will identify appropriate support needs Outcome: Progressing   Problem: Coping: Goal: Level of anxiety will decrease Outcome: Progressing

## 2023-03-20 NOTE — Progress Notes (Signed)
PROGRESS NOTE    Thomas [name]  QIH:[id number] DOB: 05/19/1930 DOA: 03/11/2023 PCP: Barbette Reichmann, MD  [id number]  LOS: 4 days   Brief hospital course:   89 y.o. male with medical history significant of hypertension, hyperlipidemia, GERD, recent CVA 3 weeks ago treated at [facility] was brought to the ED due to generalized weakness and dizziness being off balance x 1 day.  Patient came home from [facility] rehab 7 days ago after multifocal small vessel infarcts.  Was doing well at home.  Ambulating on his own with good functional status, able to perform basic ADLs.   1 night before presentation to [facility] family states the patient did not want to eat dinner and started complaining of dizziness.  The morning of presentation patient was having trouble with coordination unable to raise himself out of bed.  No evidence of trauma.   On presentation to the ED the patient is hemodynamically stable.  MRI reveals new acute CVA.  On my interview the patient is resting comfortably in bed.  Son-in-law at bedside.  Primary caregiver.  States that the patient was discharged on dual antiplatelet therapy aspirin and Plavix and has been compliant with all medications.   11/1: Patient seen in consultation by neurology who ordered CTA to evaluate carotid anatomy.  Pending CTA results and possible vascular surgery consult.  Pending PT and OT evaluations.   11/2: Case discussed at length with neurology.  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  Vascular surgery engaged for comment consideration of carotid endarterectomy.  11/9: S/p right carotid endarterectomy yesterday.  Stable, arterial line and Foley catheter removed today.  Overnight some confusion.  Assessment & Plan:   Acute CVA Patient presents 3 weeks after previous hospitalization for stroke and discharged from [facility].  Now with evidence of new acute CVA on imaging.   Patient son-in-law at bedside reports compliance with dual antiplatelet therapy and antihyperlipidemic therapy at home. Seen in consultation by neurology Concerned that recurrent strokes within the same vascular territory are secondary to calcium emboli --LDL 87.  S/p right carotid endarterectomy on 11/8 Plan: --cont ASA and plavix Continue statin   Essential hypertension Blood pressure controlled Normotensive blood pressure goals   Hyperlipidemia --LDL 87 --cont statin   GERD --cont PPI   DVT prophylaxis: Lovenox SQ Code Status: DNR  Family Communication:  Level of care: Progressive Dispo:   The patient is from: home Anticipated d/c is to: SNF rehab Anticipated d/c date is: 1-2 days   Subjective and Interval History:  Nursing concern of some overnight confusion but he was able to communicate well during morning rounds.  Mostly oriented to self.  Denies any pain.   Objective: Vitals:   03/20/23 1100 03/20/23 1217 03/20/23 1232 03/20/23 1300  BP:  (!) 171/119 107/63 98/68  Pulse: 92 88 87 82  Resp: 18 (!) 22 16 20   Temp: 98.9 F (37.2 C)     TempSrc: Oral     SpO2: 95% 93% 94% 94%  Height:        Intake/Output Summary (Last 24 hours) at 03/20/2023 1342 Last data filed at 03/20/2023 1100 Gross per 24 hour  Intake 1588.51 ml  Output 1175 ml  Net 413.51 ml   There were no vitals filed for this visit.  Examination:   General. Frail elderly man,In no acute distress, some anterior neck bruising. Pulmonary.  Lungs clear bilaterally, normal respiratory effort. CV.  Regular rate and rhythm, no JVD, rub or murmur. Abdomen.  Soft, nontender, nondistended, BS positive. CNS.  Alert and oriented .  No new focal neurologic deficit. Extremities.  No edema, no cyanosis, pulses intact and symmetrical.  Data Reviewed: I have personally reviewed labs and imaging studies  Time spent: 45 minutes  Arnetha Courser, MD [facility] If 7PM-7AM, please contact  night-coverage 03/20/2023, 1:42 PM

## 2023-03-20 NOTE — Progress Notes (Signed)
PHARMACIST - PHYSICIAN COMMUNICATION  CONCERNING: IV to Oral Route Change Policy  RECOMMENDATION: This patient is receiving pantoprazole by the intravenous route.  Based on criteria approved by the Pharmacy and Therapeutics Committee, the intravenous medication(s) is/are being converted to the equivalent oral dose form(s).  DESCRIPTION: These criteria include: The patient is eating (either orally or via tube) and/or has been taking other orally administered medications for a least 24 hours The patient has no evidence of active gastrointestinal bleeding or impaired GI absorption (gastrectomy, short bowel, patient on TNA or NPO).  If you have questions about this conversion, please contact the Pharmacy Department   Tressie Ellis, [facility] 03/20/2023 12:47 PM

## 2023-03-21 ENCOUNTER — Inpatient Hospital Stay: Payer: Medicare (Managed Care)

## 2023-03-21 DIAGNOSIS — I639 Cerebral infarction, unspecified: Secondary | ICD-10-CM | POA: Diagnosis not present

## 2023-03-21 LAB — MAGNESIUM: Magnesium: 2 mg/dL (ref 1.7–2.4)

## 2023-03-21 NOTE — Progress Notes (Signed)
[facility]  Daily Progress Note   Subjective  -   Speaks fluently.  Some seems not related to the questions but no apparent issues aside from confusion/delirium?  Says he feels full and not hungry.  Vomited small amount of bile 4 times.  Had medium soft BM this am.  Also complains of feeling warm.  Pulled on Foley yesterday and it bled and was removed and switched to a condom type system and is still bloody.  Objective Vitals:   03/21/23 0400 03/21/23 0800 03/21/23 0900 03/21/23 1200  BP: 135/82 (!) 167/86  (!) 151/84  Pulse: 72 84  (!) 32  Resp: 20 20  19   Temp:   98 F (36.7 C)   TempSrc:   Oral   SpO2: 96% 95%  96%  Height:        Intake/Output Summary (Last 24 hours) at 03/21/2023 1433 Last data filed at 03/21/2023 0900 Gross per 24 hour  Intake 100 ml  Output 725 ml  Net -625 ml    PULM  CTAB CV  RRR VASC  Ecchymosis of right neck without hematoma.  No drainage.  Tongue midline.  Bilateral arm and leg movement to command.  Wearing mitts.  No clear strength differences.  Mild left facial droop?  But similar to yesterday.  Laboratory CBC    Component Value Date/Time   WBC 8.3 03/20/2023 0412   HGB 12.4 (L) 03/20/2023 0412   HGB 10.6 (L) 10/28/2011 0622   HCT 36.7 (L) 03/20/2023 0412   HCT 40.5 10/19/2011 0819   PLT 218 03/20/2023 0412   PLT 161 10/28/2011 0622    BMET    Component Value Date/Time   NA 137 03/20/2023 0412   NA 140 10/28/2011 0622   K 3.7 03/20/2023 0412   K 4.1 10/28/2011 0622   CL 108 03/20/2023 0412   CL 108 (H) 10/28/2011 0622   CO2 21 (L) 03/20/2023 0412   CO2 24 10/28/2011 0622   GLUCOSE 109 (H) 03/20/2023 0412   GLUCOSE 81 10/28/2011 0622   BUN 12 03/20/2023 0412   BUN 17 10/28/2011 0622   CREATININE 0.97 03/20/2023 0412   CREATININE 1.12 10/28/2011 0622   CALCIUM 8.1 (L) 03/20/2023 0412   CALCIUM 7.9 (L) 10/28/2011 0622   GFRNONAA >60 03/20/2023 0412   GFRNONAA >60 10/28/2011 0622   GFRAA >60  10/28/2011 0622    Assessment/Planning: POD #2 s/p right CEA  Doing well from CEA but nausea with vomiting (small volume bile) foe unclear reasons.  KUB done but not read.  I see no stomach or bowel dilation.  Stool in colon.  Benign to my exam and X-ray read.  Await Radiology read.    Hematuria from Foley trauma.  Does not appear obstructive.    Feels warm but no fever.  No consolidation in lung bases on KUB but alert for signs of infection.  Most likely related to nausea which has an unclear cause.  Following carefully in ICU.  03/21/2023, 2:33 PM

## 2023-03-21 NOTE — Plan of Care (Signed)
  Problem: Education: Goal: Knowledge of disease or condition will improve Outcome: Progressing Goal: Knowledge of secondary prevention will improve (MUST DOCUMENT ALL) Outcome: Progressing Goal: Knowledge of patient specific risk factors will improve Loraine Leriche N/A or DELETE if not current risk factor) Outcome: Progressing   Problem: Ischemic Stroke/TIA Tissue Perfusion: Goal: Complications of ischemic stroke/TIA will be minimized Outcome: Progressing   Problem: Coping: Goal: Will verbalize positive feelings about self Outcome: Progressing Goal: Will identify appropriate support needs Outcome: Progressing   Problem: Health Behavior/Discharge Planning: Goal: Ability to manage health-related needs will improve Outcome: Progressing Goal: Goals will be collaboratively established with patient/family Outcome: Progressing   Problem: Self-Care: Goal: Ability to participate in self-care as condition permits will improve Outcome: Progressing Goal: Verbalization of feelings and concerns over difficulty with self-care will improve Outcome: Progressing Goal: Ability to communicate needs accurately will improve Outcome: Progressing   Problem: Nutrition: Goal: Risk of aspiration will decrease Outcome: Progressing Goal: Dietary intake will improve Outcome: Progressing   Problem: Education: Goal: Knowledge of General Education information will improve Description: Including pain rating scale, medication(s)/side effects and non-pharmacologic comfort measures Outcome: Progressing   Problem: Health Behavior/Discharge Planning: Goal: Ability to manage health-related needs will improve Outcome: Progressing   Problem: Clinical Measurements: Goal: Ability to maintain clinical measurements within normal limits will improve Outcome: Progressing Goal: Will remain free from infection Outcome: Progressing Goal: Diagnostic test results will improve Outcome: Progressing Goal: Respiratory  complications will improve Outcome: Progressing Goal: Cardiovascular complication will be avoided Outcome: Progressing   Problem: Activity: Goal: Risk for activity intolerance will decrease Outcome: Progressing   Problem: Nutrition: Goal: Adequate nutrition will be maintained Outcome: Progressing   Problem: Coping: Goal: Level of anxiety will decrease Outcome: Progressing   Problem: Elimination: Goal: Will not experience complications related to bowel motility Outcome: Progressing Goal: Will not experience complications related to urinary retention Outcome: Progressing   Problem: Pain Management: Goal: General experience of comfort will improve Outcome: Progressing   Problem: Safety: Goal: Ability to remain free from injury will improve Outcome: Progressing   Problem: Skin Integrity: Goal: Risk for impaired skin integrity will decrease Outcome: Progressing   Problem: Education: Goal: Knowledge of discharge needs will improve Outcome: Progressing   Problem: Clinical Measurements: Goal: Postoperative complications will be avoided or minimized Outcome: Progressing   Problem: Respiratory: Goal: Ability to achieve and maintain a regular respiratory rate will improve Outcome: Progressing   Problem: Skin Integrity: Goal: Demonstration of wound healing without infection will improve Outcome: Progressing

## 2023-03-21 NOTE — Plan of Care (Signed)
  Problem: Education: Goal: Knowledge of disease or condition will improve Outcome: Not Progressing Goal: Knowledge of secondary prevention will improve (MUST DOCUMENT ALL) Outcome: Not Progressing Goal: Knowledge of patient specific risk factors will improve Loraine Leriche N/A or DELETE if not current risk factor) Outcome: Not Progressing   Problem: Ischemic Stroke/TIA Tissue Perfusion: Goal: Complications of ischemic stroke/TIA will be minimized Outcome: Not Progressing   Problem: Coping: Goal: Will verbalize positive feelings about self Outcome: Not Progressing Goal: Will identify appropriate support needs Outcome: Not Progressing   Problem: Health Behavior/Discharge Planning: Goal: Ability to manage health-related needs will improve Outcome: Not Progressing Goal: Goals will be collaboratively established with patient/family Outcome: Not Progressing   Problem: Self-Care: Goal: Ability to participate in self-care as condition permits will improve Outcome: Not Progressing Goal: Verbalization of feelings and concerns over difficulty with self-care will improve Outcome: Not Progressing Goal: Ability to communicate needs accurately will improve Outcome: Not Progressing   Problem: Nutrition: Goal: Risk of aspiration will decrease Outcome: Not Progressing Goal: Dietary intake will improve Outcome: Not Progressing   Problem: Education: Goal: Knowledge of General Education information will improve Description: Including pain rating scale, medication(s)/side effects and non-pharmacologic comfort measures Outcome: Not Progressing   Problem: Health Behavior/Discharge Planning: Goal: Ability to manage health-related needs will improve Outcome: Not Progressing   Problem: Clinical Measurements: Goal: Ability to maintain clinical measurements within normal limits will improve Outcome: Not Progressing Goal: Will remain free from infection Outcome: Not Progressing Goal: Diagnostic test  results will improve Outcome: Not Progressing Goal: Respiratory complications will improve Outcome: Not Progressing Goal: Cardiovascular complication will be avoided Outcome: Not Progressing   Problem: Activity: Goal: Risk for activity intolerance will decrease Outcome: Not Progressing   Problem: Nutrition: Goal: Adequate nutrition will be maintained Outcome: Not Progressing   Problem: Coping: Goal: Level of anxiety will decrease Outcome: Not Progressing   Problem: Elimination: Goal: Will not experience complications related to bowel motility Outcome: Not Progressing Goal: Will not experience complications related to urinary retention Outcome: Not Progressing   Problem: Pain Management: Goal: General experience of comfort will improve Outcome: Not Progressing   Problem: Safety: Goal: Ability to remain free from injury will improve Outcome: Not Progressing   Problem: Skin Integrity: Goal: Risk for impaired skin integrity will decrease Outcome: Not Progressing   Problem: Education: Goal: Knowledge of discharge needs will improve Outcome: Not Progressing   Problem: Clinical Measurements: Goal: Postoperative complications will be avoided or minimized Outcome: Not Progressing   Problem: Respiratory: Goal: Ability to achieve and maintain a regular respiratory rate will improve Outcome: Not Progressing   Problem: Skin Integrity: Goal: Demonstration of wound healing without infection will improve Outcome: Not Progressing

## 2023-03-21 NOTE — Progress Notes (Signed)
PROGRESS NOTE    Thomas [name]  WUJ:[id number] DOB: [birth year] DOA: 03/11/2023 PCP: Barbette Reichmann, MD  [id number]  LOS: 5 days   Brief hospital course:   89 y.o. male with medical history significant of hypertension, hyperlipidemia, GERD, recent CVA 3 weeks ago treated at [facility] was brought to the ED due to generalized weakness and dizziness being off balance x 1 day.  Patient came home from [facility] rehab 7 days ago after multifocal small vessel infarcts.  Was doing well at home.  Ambulating on his own with good functional status, able to perform basic ADLs.   1 night before presentation to [facility] family states the patient did not want to eat dinner and started complaining of dizziness.  The morning of presentation patient was having trouble with coordination unable to raise himself out of bed.  No evidence of trauma.   On presentation to the ED the patient is hemodynamically stable.  MRI reveals new acute CVA.  On my interview the patient is resting comfortably in bed.  Son-in-law at bedside.  Primary caregiver.  States that the patient was discharged on dual antiplatelet therapy aspirin and Plavix and has been compliant with all medications.   11/1: Patient seen in consultation by neurology who ordered CTA to evaluate carotid anatomy.  Pending CTA results and possible vascular surgery consult.  Pending PT and OT evaluations.   11/2: Case discussed at length with neurology.  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  Vascular surgery engaged for comment consideration of carotid endarterectomy.  11/9: S/p right carotid endarterectomy yesterday.  Stable, arterial line and Foley catheter removed today.  Overnight some confusion.  Assessment & Plan:   Acute CVA Patient presents 3 weeks after previous hospitalization for stroke and discharged from [facility].  Now with evidence of new acute CVA on imaging.   Patient son-in-law at bedside reports compliance with dual antiplatelet therapy and antihyperlipidemic therapy at home. Seen in consultation by neurology Concerned that recurrent strokes within the same vascular territory are secondary to calcium emboli --LDL 87.  S/p right carotid endarterectomy on 11/8 Plan: --cont ASA and plavix --cont statin   Essential hypertension Blood pressure controlled Normotensive blood pressure goals   Hyperlipidemia --LDL 87 --cont statin   GERD --cont PPI  Hospital delirium --more confused since the surgery. --supportive care  Bilious vomiting --started this morning --KUB   DVT prophylaxis: Lovenox SQ Code Status: DNR  Family Communication: son-in-law updated at bedside today Level of care: Med-Surg Dispo:   The patient is from: home Anticipated d/c is to: SNF rehab Anticipated d/c date is: 1-2 days   Subjective and Interval History:  Pt had multiple episodes of vomiting this morning, green liquid.   Objective: Vitals:   03/21/23 0800 03/21/23 0900 03/21/23 1200 03/21/23 1600  BP: (!) 167/86  (!) 151/84 (!) 148/91  Pulse: 84  (!) 32 85  Resp: 20  19 16   Temp:  98 F (36.7 C) 97.8 F (36.6 C) 98 F (36.7 C)  TempSrc:  Oral Oral Oral  SpO2: 95%  96% 94%  Height:        Intake/Output Summary (Last 24 hours) at 03/21/2023 1805 Last data filed at 03/21/2023 1730 Gross per 24 hour  Intake 220 ml  Output 450 ml  Net -230 ml   There were no vitals filed for this visit.  Examination:   Constitutional: NAD, alert, mittens on HEENT: conjunctivae  and lids normal, EOMI CV: No cyanosis.   RESP: normal respiratory effort SKIN: warm, dry   Data Reviewed: I have personally reviewed labs and imaging studies  Time spent: 50 minutes  [name], MD [facility] If 7PM-7AM, please contact night-coverage 03/21/2023, 6:05 PM

## 2023-03-22 ENCOUNTER — Encounter: Payer: Self-pay | Admitting: Vascular Surgery

## 2023-03-22 DIAGNOSIS — I639 Cerebral infarction, unspecified: Secondary | ICD-10-CM | POA: Diagnosis not present

## 2023-03-22 LAB — SURGICAL PATHOLOGY

## 2023-03-22 MED ORDER — SMOG ENEMA
960.0000 mL | Freq: Once | RECTAL | Status: DC
  Filled 2023-03-22: qty 960

## 2023-03-22 MED ORDER — SODIUM CHLORIDE 0.9 % IV BOLUS
500.0000 mL | Freq: Once | INTRAVENOUS | Status: AC
  Administered 2023-03-22: 500 mL via INTRAVENOUS

## 2023-03-22 MED ORDER — BISACODYL 10 MG RE SUPP
10.0000 mg | Freq: Once | RECTAL | Status: AC
  Administered 2023-03-22: 10 mg via RECTAL
  Filled 2023-03-22: qty 1

## 2023-03-22 MED ORDER — ATORVASTATIN CALCIUM 20 MG PO TABS
40.0000 mg | ORAL_TABLET | Freq: Every day | ORAL | Status: DC
  Administered 2023-03-22 – 2023-04-19 (×29): 40 mg via ORAL
  Filled 2023-03-22 (×28): qty 2

## 2023-03-22 NOTE — Progress Notes (Signed)
PROGRESS NOTE    KELVEN DEETS  WUJ:[id number] DOB: [birth year] DOA: 03/11/2023 PCP: Barbette Reichmann, MD  [id number]  LOS: 6 days   Brief hospital course:   89 y.o. male with medical history significant of hypertension, hyperlipidemia, GERD, recent CVA 3 weeks ago treated at [facility] was brought to the ED due to generalized weakness and dizziness being off balance x 1 day.  Patient came home from [facility] rehab 7 days ago after multifocal small vessel infarcts.  Was doing well at home.  Ambulating on his own with good functional status, able to perform basic ADLs.   1 night before presentation to [facility] family states the patient did not want to eat dinner and started complaining of dizziness.  The morning of presentation patient was having trouble with coordination unable to raise himself out of bed.  No evidence of trauma.   On presentation to the ED the patient is hemodynamically stable.  MRI reveals new acute CVA.  On my interview the patient is resting comfortably in bed.  Son-in-law at bedside.  Primary caregiver.  States that the patient was discharged on dual antiplatelet therapy aspirin and Plavix and has been compliant with all medications.   11/1: Patient seen in consultation by neurology who ordered CTA to evaluate carotid anatomy.  Pending CTA results and possible vascular surgery consult.  Pending PT and OT evaluations.   11/2: Case discussed at length with neurology.  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  Vascular surgery engaged for comment consideration of carotid endarterectomy.  11/9: S/p right carotid endarterectomy yesterday.  Stable, arterial line and Foley catheter removed today.  Overnight some confusion.  Assessment & Plan:   Acute CVA Patient presents 3 weeks after previous hospitalization for stroke and discharged from [facility].  Now with evidence of new acute CVA on imaging.   Patient son-in-law at bedside reports compliance with dual antiplatelet therapy and antihyperlipidemic therapy at home. Seen in consultation by neurology Concerned that recurrent strokes within the same vascular territory are secondary to calcium emboli --LDL 87.  S/p right carotid endarterectomy on 11/8 Plan: --cont ASA  --hold plavix due to gross hematuria --switch statin to lipitor 40   Hematuria 2/2 penile trauma --per nursing, pt was confused post-op and pulled out his Foley.  Has been having hematuria since then. --urology consulted --hold plavix  Acute urinary retention --PVR 699 today --urology consulted, Foley inserted --f/u with [facility] for a voiding trial in a week or two  --consider flomax  Essential hypertension, not currently active   Hyperlipidemia --LDL 87 --switch statin to lipitor 40   GERD --cont PPI  Hospital delirium --more confused since the surgery. --supportive care  Bilious vomiting --started morning of 11/10, resolved by next day --KUB showed mod stool burden in rectum --suppository   DVT prophylaxis: Lovenox SQ Code Status: DNR  Family Communication:  Level of care: Med-Surg Dispo:   The patient is from: home Anticipated d/c is to: SNF rehab Anticipated d/c date is: 1-2 days   Subjective and Interval History:  No more vomiting since last night.  Pt has improved oral intake.  When asked, pt reported penile pain.  Still having dark red urine.  Later pt was found to be retaining urine.  Foley inserted.   Objective: Vitals:   03/22/23 1222 03/22/23 1231 03/22/23 1458 03/22/23 1604  BP: (!) 77/49 (!) 87/63 (!) 90/59 102/71  Pulse: 96 96  99 91  Resp: 19 16 16 19   Temp: 98 F (36.7 C) 98.2 F (36.8 C) 98 F (36.7 C) 98 F (36.7 C)  TempSrc: Oral Oral    SpO2: 95% 92% 93% 93%  Height:        Intake/Output Summary (Last 24 hours) at 03/22/2023 1909 Last data filed at 03/22/2023 1800 Gross per 24 hour  Intake 553.33  ml  Output 1150 ml  Net -596.67 ml   There were no vitals filed for this visit.  Examination:   Constitutional: NAD, alert HEENT: conjunctivae and lids normal, EOMI CV: No cyanosis.   RESP: normal respiratory effort, on RA Neuro: II - XII grossly intact.   Urine dark red   Data Reviewed: I have personally reviewed labs and imaging studies  Time spent: 50 minutes  [name], MD [facility] If 7PM-7AM, please contact night-coverage 03/22/2023, 7:09 PM

## 2023-03-22 NOTE — Progress Notes (Signed)
Informed family of room changed noted to 119

## 2023-03-22 NOTE — Plan of Care (Signed)
  Problem: Education: Goal: Knowledge of disease or condition will improve Outcome: Not Progressing   Problem: Health Behavior/Discharge Planning: Goal: Ability to manage health-related needs will improve Outcome: Not Progressing   Problem: Self-Care: Goal: Ability to participate in self-care as condition permits will improve Outcome: Not Progressing Goal: Ability to communicate needs accurately will improve Outcome: Not Progressing   Problem: Nutrition: Goal: Dietary intake will improve Outcome: Not Progressing   Problem: Health Behavior/Discharge Planning: Goal: Ability to manage health-related needs will improve Outcome: Not Progressing   Problem: Nutrition: Goal: Adequate nutrition will be maintained Outcome: Not Progressing

## 2023-03-22 NOTE — Progress Notes (Signed)
Progress Note    03/22/2023 11:13 AM 3 Days Post-Op  Subjective:  Thomas [name] is a 89 yo male who is now POD #3 from right carotid endarterectomy.  Patient is recovering as expected.  Patient was sleeping this morning in bed when I arrived to examine him.  Patient remains with erythema to the right side of his neck.  Incisions clean dry and intact no hematoma seroma to note.  Patient unable to answer all of my questions appropriate this morning but I just woke him up out of a sound sleep.  No complaints this morning and vitals are remained stable.   Vitals:   03/22/23 0800 03/22/23 0959  BP: 100/71 115/74  Pulse: 88 84  Resp: 16 20  Temp: 98.5 F (36.9 C) 98.7 F (37.1 C)  SpO2: 92% 93%   Physical Exam: Cardiac:  RRR, Normal S1,S2. No murmur Lungs: Normal respiratory effort, clear on auscultation throughout but diminished in the bases.  No rales rhonchi or wheezing. Incisions: Right neck incision with erythema.  No signs or symptoms of hematoma seroma or infection.  No drainage noted. Extremities: Patient is moving all extremities well.  Patient has noted to be weaker on the left side than the right side prior to admission.  This remains the same.  Palpable pulses throughout. Abdomen: Positive bowel sounds throughout, soft, nontender and nondistended. Neurologic: Pleasantly confused this morning but I just woke him up out of a sound sleep.  Slow to respond to questions and commands normally and this is present this morning.  CBC    Component Value Date/Time   WBC 8.3 03/20/2023 0412   RBC 4.06 (L) 03/20/2023 0412   HGB 12.4 (L) 03/20/2023 0412   HGB 10.6 (L) 10/28/2011 0622   HCT 36.7 (L) 03/20/2023 0412   HCT 40.5 10/19/2011 0819   PLT 218 03/20/2023 0412   PLT 161 10/28/2011 0622   MCV 90.4 03/20/2023 0412   MCV 91 10/19/2011 0819   MCH 30.5 03/20/2023 0412   MCHC 33.8 03/20/2023 0412   RDW 13.4 03/20/2023 0412   RDW 14.3 10/19/2011 0819   LYMPHSABS 1.1 03/15/2023  1128   MONOABS 0.2 03/15/2023 1128   EOSABS 0.3 03/15/2023 1128   BASOSABS 0.0 03/15/2023 1128    BMET    Component Value Date/Time   NA 137 03/20/2023 0412   NA 140 10/28/2011 0622   K 3.7 03/20/2023 0412   K 4.1 10/28/2011 0622   CL 108 03/20/2023 0412   CL 108 (H) 10/28/2011 0622   CO2 21 (L) 03/20/2023 0412   CO2 24 10/28/2011 0622   GLUCOSE 109 (H) 03/20/2023 0412   GLUCOSE 81 10/28/2011 0622   BUN 12 03/20/2023 0412   BUN 17 10/28/2011 0622   CREATININE 0.97 03/20/2023 0412   CREATININE 1.12 10/28/2011 0622   CALCIUM 8.1 (L) 03/20/2023 0412   CALCIUM 7.9 (L) 10/28/2011 0622   GFRNONAA >60 03/20/2023 0412   GFRNONAA >60 10/28/2011 0622   GFRAA >60 10/28/2011 0622    INR    Component Value Date/Time   INR 1.1 03/11/2023 1359   INR 0.9 10/19/2011 0819     Intake/Output Summary (Last 24 hours) at 03/22/2023 1113 Last data filed at 03/22/2023 0915 Gross per 24 hour  Intake 120 ml  Output 750 ml  Net -630 ml     Assessment/Plan:  89 y.o. male is s/p right carotid endarterectomy.  3 Days Post-Op   Per vascular surgery patient okay to discharge when medically  stable.  He will need to continue aspirin 81 mg daily, Plavix 75 mg daily and Mevacor 40 mg daily.  Follow-up in vein and vascular clinic in 1 month with carotid ultrasounds.  DVT prophylaxis: Aspirin 81 mg daily Plavix 75 mg daily.   Marcie Bal [facility] 03/22/2023 11:13 AM

## 2023-03-22 NOTE — Progress Notes (Signed)
Pt unable to urinate. Notified provider and given order to bladder scan. Bladder scan showed >600. Foley order placed and this RN attempted x2, with a 82fr and 61fr. Both time met resistance. Notified provider and urology cart was requested to the floor for urologist. MD will come to floor today to place foley. Family at bedside and provided update. Pt tol procedure well. NAD. VSS.

## 2023-03-22 NOTE — TOC Progression Note (Signed)
Transition of Care [facility]) - Progression Note    Patient Details  Name: Thomas [name] MRN: [id number] Date of Birth: 10-19-1930  Transition of Care [facility]) CM/SW Contact  Allena Katz, LCSW Phone Number: 03/22/2023, 10:31 AM  Clinical Narrative:   CSW spoke with tim about bed offers he wants to talk with his wife about them and then get back to me.    Expected Discharge Plan: Skilled Nursing Facility Barriers to Discharge: Continued Medical Work up  Expected Discharge Plan and Services                                               Social Determinants of Health (SDOH) Interventions SDOH Screenings   Food Insecurity: No Food Insecurity (03/12/2023)  Housing: Low Risk  (03/12/2023)  Transportation Needs: No Transportation Needs (03/12/2023)  Utilities: Not At Risk (03/12/2023)  Financial Resource Strain: Low Risk  (10/12/2022)   Received from [facility]  Tobacco Use: Low Risk  (03/19/2023)  Health Literacy: High Risk (03/12/2023)   Received from [facility]    Readmission Risk Interventions     No data to display

## 2023-03-22 NOTE — Consult Note (Signed)
Subjective: 1. Generalized weakness      Consult requested by [name] MD  Thomas [name] is a 89 yo male with no urologic history who recently had a right carotid endarterectomy following a recent CVA.   He had a foley catheter in place but pulled it with the balloon inflated during a period of confusion.   He was noted to have penile pain and blood at the meatus and a PVR was .   Nursing attempts at foley placement were unsuccessful.   He has been on ASA and plavix.   ROS:  Review of Systems  Unable to perform ROS: Acuity of condition    No Known Allergies  Past Medical History:  Diagnosis Date   Arthritis    Carpal tunnel syndrome on both sides    GERD (gastroesophageal reflux disease)    Hyperlipidemia    Hypertension     Past Surgical History:  Procedure Laterality Date   ENDARTERECTOMY Right 03/19/2023   Procedure: ENDARTERECTOMY CAROTID;  Surgeon: Renford Dills, MD;  Location: [facility] ORS;  Service: Vascular;  Laterality: Right;   INGUINAL HERNIA REPAIR  2001   REPLACEMENT TOTAL KNEE Bilateral     Social History   Socioeconomic History   Marital status: Widowed    Spouse name: Not on file   Number of children: Not on file   Years of education: Not on file   Highest education level: Not on file  Occupational History   Not on file  Tobacco Use   Smoking status: Never   Smokeless tobacco: Never  Vaping Use   Vaping status: Never Used  Substance and Sexual Activity   Alcohol use: Never   Drug use: Never   Sexual activity: Not on file  Other Topics Concern   Not on file  Social History Narrative   Not on file   Social Determinants of Health   Financial Resource Strain: Low Risk  (10/12/2022)   Received from [facility]   Overall Financial Resource Strain (CARDIA)    Difficulty of Paying Living Expenses: Not hard at all  Food Insecurity: No Food Insecurity (03/12/2023)   Hunger Vital Sign    Worried About Running Out of Food in the  Last Year: Never true    Ran Out of Food in the Last Year: Never true  Transportation Needs: No Transportation Needs (03/12/2023)   PRAPARE - Administrator, Civil Service (Medical): No    Lack of Transportation (Non-Medical): No  Physical Activity: Not on file  Stress: Not on file  Social Connections: Not on file  Intimate Partner Violence: Not At Risk (03/12/2023)   Humiliation, Afraid, Rape, and Kick questionnaire    Fear of Current or Ex-Partner: No    Emotionally Abused: No    Physically Abused: No    Sexually Abused: No    Family History  Problem Relation Age of Onset   Cancer Sister        Liver cancer   Melanoma Sister    Cancer Brother        Pancreatic   Cancer Brother        Lung cancer   Heart disease Brother     Anti-infectives: Anti-infectives (From admission, onward)    Start     Dose/Rate Route Frequency Ordered Stop   03/19/23 1730  ceFAZolin (ANCEF) IVPB 2g/100 mL premix        2 g 200 mL/hr over 30 Minutes Intravenous Every 8 hours 03/19/23  1627 03/20/23 0211   03/18/23 2152  ceFAZolin (ANCEF) IVPB 2g/100 mL premix        2 g 200 mL/hr over 30 Minutes Intravenous 30 min pre-op 03/18/23 2152 03/19/23 0813       Current Facility-Administered Medications  Medication Dose Route Frequency Provider Last Rate Last Admin   acetaminophen (TYLENOL) tablet 325-650 mg  325-650 mg Oral Q4H PRN Schnier, Latina Craver, MD   325 mg at 03/19/23 1639   Or   acetaminophen (TYLENOL) suppository 325-650 mg  325-650 mg Rectal Q4H PRN Schnier, Latina Craver, MD       alum & mag hydroxide-simeth (MAALOX/MYLANTA) 200-200-20 MG/5ML suspension 15-30 mL  15-30 mL Oral Q2H PRN Schnier, Latina Craver, MD       aspirin EC tablet 81 mg  81 mg Oral Daily Schnier, Latina Craver, MD   81 mg at 03/22/23 0853   atorvastatin (LIPITOR) tablet 40 mg  40 mg Oral Daily [name], MD       bisacodyl (DULCOLAX) suppository 10 mg  10 mg Rectal Once [name], MD       Chlorhexidine Gluconate Cloth  2 % PADS 6 each  6 each Topical Daily [name], MD   6 each at 03/22/23 1234   docusate sodium (COLACE) capsule 100 mg  100 mg Oral Daily Schnier, Latina Craver, MD   100 mg at 03/22/23 0853   guaiFENesin-dextromethorphan (ROBITUSSIN DM) 100-10 MG/5ML syrup 15 mL  15 mL Oral Q4H PRN Schnier, Latina Craver, MD       hydrALAZINE (APRESOLINE) injection 10 mg  10 mg Intravenous Q6H PRN Schnier, Latina Craver, MD       labetalol (NORMODYNE) injection 10 mg  10 mg Intravenous Q10 min PRN Schnier, Latina Craver, MD   10 mg at 03/20/23 0854   metoprolol tartrate (LOPRESSOR) injection 2-5 mg  2-5 mg Intravenous Q2H PRN Schnier, Latina Craver, MD       ondansetron [facility]) injection 4 mg  4 mg Intravenous Q6H PRN Schnier, Latina Craver, MD   4 mg at 03/21/23 0941   oxyCODONE (Oxy IR/ROXICODONE) immediate release tablet 5-10 mg  5-10 mg Oral Q4H PRN Schnier, Latina Craver, MD       pantoprazole (PROTONIX) EC tablet 40 mg  40 mg Oral QHS Tressie Ellis, RPH   40 mg at 03/21/23 2134   phenol (CHLORASEPTIC) mouth spray 1 spray  1 spray Mouth/Throat PRN Schnier, Latina Craver, MD       sertraline (ZOLOFT) tablet 25 mg  25 mg Oral Daily Schnier, Latina Craver, MD   25 mg at 03/22/23 0853   sodium chloride flush (NS) 0.9 % injection 3 mL  3 mL Intravenous Q12H Iline Oven, MD   3 mL at 03/22/23 0854   sorbitol 70 % solution 30 mL  30 mL Oral Daily PRN Schnier, Latina Craver, MD         Objective: Vital signs in last 24 hours: BP 102/71 (BP Location: Right Arm)   Pulse 91   Temp 98 F (36.7 C)   Resp 19   Ht 5\' 8"  (1.727 m)   SpO2 93%   BMI 24.94 kg/m   Intake/Output from previous day: 11/10 0701 - 11/11 0700 In: 220 [P.O.:220] Out: 800 [Urine:700; Emesis/NG output:100] Intake/Output this shift: Total I/O In: 533.3 [IV Piggyback:533.3] Out: 50 [Urine:50]   Physical Exam Vitals reviewed.  Constitutional:      Appearance: Normal appearance.  Abdominal:     Palpations: Abdomen  is soft.     Tenderness: There is abdominal  tenderness (in the suprapubic area with fullness consistent with a distended bladder.).  Genitourinary:    Comments: Uncirc phallus with moderate phimosis. Meatus normal but with blood present. Scrotum and contents normal.  Neurological:     Mental Status: He is alert.     Lab Results:  No results found for this or any previous visit (from the past 24 hour(s)).  BMET Recent Labs    03/20/23 0412  NA 137  K 3.7  CL 108  CO2 21*  GLUCOSE 109*  BUN 12  CREATININE 0.97  CALCIUM 8.1*   PT/INR No results for input(s): "LABPROT", "INR" in the last 72 hours. ABG No results for input(s): "PHART", "HCO3" in the last 72 hours.  Invalid input(s): "PCO2", "PO2"  Studies/Results: DG Abd 1 View  Result Date: 03/21/2023 CLINICAL DATA:  Vomiting and weakness for 2 days. EXAM: ABDOMEN - 1 VIEW COMPARISON:  CT abdomen pelvis dated 02/14/2023. FINDINGS: There is a moderate amount of stool in the rectum. No dilated loops of bowel. Air-fluid levels and free intraperitoneal air can not be excluded on the supine exam. No radiopaque calculi in the expected location of the urinary tract. Degenerative changes are seen in the spine. IMPRESSION: Moderate amount of stool in the rectum. No dilated loops of bowel. Electronically Signed   By: Romona Curls M.D.   On: 03/21/2023 17:36    Procedure: Complicated foley placement.  He was prepped with betadine and draped with sterile towels.   The urethra was instilled with 5ml of lubricant and well lubricated 23fr coude foley was passed with mild resistance at the sphincter.  The catheter was placed to a drainage bag.  There was initial return of minimally bloody urine that became progressively bloody with passage of clots.    Assessment/Plan: Traumatic foley removal with hematuria and retention.   51fr coude foley placed.  He will need hand irrigation prn to maintain a patent foley.   BPH with Urinary retention.  His prostate was moderately large with an  apparent middle lobe on his recent CT on 02/14/23.  It would be worthwhile to consider starting tamsulosin 0.4mg  and have him return to [facility] in a week or so for a voiding trial.         No follow-ups on file.    CC: Dr. [name].      Bjorn Pippin 03/22/2023

## 2023-03-22 NOTE — Plan of Care (Signed)
  Problem: Education: Goal: Knowledge of disease or condition will improve Outcome: Progressing   Problem: Coping: Goal: Will verbalize positive feelings about self Outcome: Progressing   Problem: Health Behavior/Discharge Planning: Goal: Ability to manage health-related needs will improve Outcome: Progressing   Problem: Self-Care: Goal: Ability to participate in self-care as condition permits will improve Outcome: Progressing   Problem: Nutrition: Goal: Risk of aspiration will decrease Outcome: Progressing   Problem: Education: Goal: Knowledge of General Education information will improve Description: Including pain rating scale, medication(s)/side effects and non-pharmacologic comfort measures Outcome: Progressing   Problem: Activity: Goal: Risk for activity intolerance will decrease Outcome: Progressing   Problem: Nutrition: Goal: Adequate nutrition will be maintained Outcome: Progressing   Problem: Coping: Goal: Level of anxiety will decrease Outcome: Progressing   Problem: Pain Management: Goal: General experience of comfort will improve Outcome: Progressing   Problem: Safety: Goal: Ability to remain free from injury will improve Outcome: Progressing   Problem: Skin Integrity: Goal: Risk for impaired skin integrity will decrease Outcome: Progressing

## 2023-03-23 DIAGNOSIS — I639 Cerebral infarction, unspecified: Secondary | ICD-10-CM | POA: Diagnosis not present

## 2023-03-23 LAB — CBC
HCT: 32.8 % — ABNORMAL LOW (ref 39.0–52.0)
Hemoglobin: 10.8 g/dL — ABNORMAL LOW (ref 13.0–17.0)
MCH: 30.2 pg (ref 26.0–34.0)
MCHC: 32.9 g/dL (ref 30.0–36.0)
MCV: 91.6 fL (ref 80.0–100.0)
Platelets: 257 10*3/uL (ref 150–400)
RBC: 3.58 MIL/uL — ABNORMAL LOW (ref 4.22–5.81)
RDW: 13.8 % (ref 11.5–15.5)
WBC: 9.6 10*3/uL (ref 4.0–10.5)
nRBC: 0 % (ref 0.0–0.2)

## 2023-03-23 LAB — BASIC METABOLIC PANEL
Anion gap: 6 (ref 5–15)
BUN: 34 mg/dL — ABNORMAL HIGH (ref 8–23)
CO2: 25 mmol/L (ref 22–32)
Calcium: 8.5 mg/dL — ABNORMAL LOW (ref 8.9–10.3)
Chloride: 106 mmol/L (ref 98–111)
Creatinine, Ser: 2.24 mg/dL — ABNORMAL HIGH (ref 0.61–1.24)
GFR, Estimated: 27 mL/min — ABNORMAL LOW (ref >60)
Glucose, Bld: 112 mg/dL — ABNORMAL HIGH (ref 70–99)
Potassium: 3.5 mmol/L (ref 3.5–5.1)
Sodium: 137 mmol/L (ref 135–145)

## 2023-03-23 LAB — MAGNESIUM: Magnesium: 2.6 mg/dL — ABNORMAL HIGH (ref 1.7–2.4)

## 2023-03-23 MED ORDER — LACTATED RINGERS IV SOLN: INTRAVENOUS | Status: AC

## 2023-03-23 MED ORDER — TRAZODONE HCL 50 MG PO TABS
50.0000 mg | ORAL_TABLET | Freq: Once | ORAL | Status: AC
  Administered 2023-03-23: 50 mg via ORAL
  Filled 2023-03-23: qty 1

## 2023-03-23 MED ORDER — TAMSULOSIN HCL 0.4 MG PO CAPS
0.4000 mg | ORAL_CAPSULE | Freq: Every day | ORAL | Status: DC
  Administered 2023-03-23 – 2023-04-19 (×28): 0.4 mg via ORAL
  Filled 2023-03-23 (×28): qty 1

## 2023-03-23 NOTE — Progress Notes (Signed)
Progress Note    03/23/2023 11:36 AM 4 Days Post-Op  Subjective:  Thomas [name] is a 89 yo male who is now POD #4 from right carotid endarterectomy.  Patient is recovering as expected.  Patient was sleeping this morning in bed when I arrived to examine him.  Patient remains with erythema to the right side of his neck.  Incisions clean dry and intact no hematoma seroma to note.  Patient unable to answer all of my questions appropriate this morning but I just woke him up out of a sound sleep.  No complaints this morning and vitals are remained stable.     Vitals:   03/23/23 0541 03/23/23 0753  BP: 104/67 (!) 89/57  Pulse: 77 68  Resp: 18 15  Temp: (!) 97.4 F (36.3 C) 97.7 F (36.5 C)  SpO2: 97% 97%   Physical Exam: Cardiac:  RRR, Normal S1,S2. No murmur Lungs: Normal respiratory effort, clear on auscultation throughout but diminished in the bases.  No rales rhonchi or wheezing. Incisions: Right neck incision with erythema.  No signs or symptoms of hematoma seroma or infection.  No drainage noted. Extremities: Patient is moving all extremities well.  Patient has noted to be weaker on the left side than the right side prior to admission.  This remains the same.  Palpable pulses throughout. Abdomen: Positive bowel sounds throughout, soft, nontender and nondistended. Neurologic: Pleasantly confused this morning but I just woke him up out of a sound sleep.  Slow to respond to questions and commands normally and this is present this morning.  CBC    Component Value Date/Time   WBC 9.6 03/23/2023 0523   RBC 3.58 (L) 03/23/2023 0523   HGB 10.8 (L) 03/23/2023 0523   HGB 10.6 (L) 10/28/2011 0622   HCT 32.8 (L) 03/23/2023 0523   HCT 40.5 10/19/2011 0819   PLT 257 03/23/2023 0523   PLT 161 10/28/2011 0622   MCV 91.6 03/23/2023 0523   MCV 91 10/19/2011 0819   MCH 30.2 03/23/2023 0523   MCHC 32.9 03/23/2023 0523   RDW 13.8 03/23/2023 0523   RDW 14.3 10/19/2011 0819   LYMPHSABS 1.1  03/15/2023 1128   MONOABS 0.2 03/15/2023 1128   EOSABS 0.3 03/15/2023 1128   BASOSABS 0.0 03/15/2023 1128    BMET    Component Value Date/Time   NA 137 03/23/2023 0523   NA 140 10/28/2011 0622   K 3.5 03/23/2023 0523   K 4.1 10/28/2011 0622   CL 106 03/23/2023 0523   CL 108 (H) 10/28/2011 0622   CO2 25 03/23/2023 0523   CO2 24 10/28/2011 0622   GLUCOSE 112 (H) 03/23/2023 0523   GLUCOSE 81 10/28/2011 0622   BUN 34 (H) 03/23/2023 0523   BUN 17 10/28/2011 0622   CREATININE 2.24 (H) 03/23/2023 0523   CREATININE 1.12 10/28/2011 0622   CALCIUM 8.5 (L) 03/23/2023 0523   CALCIUM 7.9 (L) 10/28/2011 0622   GFRNONAA 27 (L) 03/23/2023 0523   GFRNONAA >60 10/28/2011 0622   GFRAA >60 10/28/2011 0622    INR    Component Value Date/Time   INR 1.1 03/11/2023 1359   INR 0.9 10/19/2011 0819     Intake/Output Summary (Last 24 hours) at 03/23/2023 1136 Last data filed at 03/23/2023 1118 Gross per 24 hour  Intake 913.33 ml  Output 1401 ml  Net -487.67 ml     Assessment/Plan:  89 y.o. male is s/p right carotid endarterectomy.  4 Days Post-Op   Per vascular surgery  patient okay to discharge when medically stable.  He will need to continue aspirin 81 mg daily, Plavix 75 mg daily and Mevacor 40 mg daily.  Follow-up in vein and vascular clinic in 1 month with carotid ultrasounds.   DVT prophylaxis: Aspirin 81 mg daily Plavix 75 mg daily.   Marcie Bal [facility] 03/23/2023 11:36 AM

## 2023-03-23 NOTE — Progress Notes (Signed)
PROGRESS NOTE    Thomas [name]  ZOX:[id number] DOB: April 30, 1931 DOA: 03/11/2023 PCP: Barbette Reichmann, MD  [id number]/119A-AA  LOS: 7 days   Brief hospital course:  Thomas [name] is a 89 y.o. male with medical history significant of hypertension, hyperlipidemia, recent CVA 3 weeks ago treated at [facility] was brought to the ED due to generalized weakness and dizziness being off balance x 1 day.    Patient came home from [facility] rehab 7 days PTA after multifocal small vessel infarcts.  Was doing well at home.  Ambulating on his own with good functional status, able to perform basic ADLs.  1 night before presentation to [facility] The family states the patient did not want to eat dinner and started complaining of dizziness.  The morning of presentation patient was having trouble with coordination unable to raise himself out of bed.     On presentation to the ED MRI reveals new acute CVA.    Patient seen in consultation by neurology who ordered CTA to evaluate carotid anatomy. 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  Vascular surgery consulted and performed right carotid endarterectomy on 03/19/23.  Post-op, pt was more confused and pulled out his Foley causing penile trauma hematuria with subsequent acute urinary retention.  Assessment & Plan:   Acute CVA Patient presents 3 weeks after previous hospitalization for stroke and discharged from [facility].  Now with evidence of new acute CVA on imaging.  Pt was compliance with dual antiplatelet therapy and antihyperlipidemic therapy at home. --LDL 87.  switched home statin to lipitor 40 --S/p right carotid endarterectomy on 11/8 Plan: --cont ASA  --hold plavix due to gross hematuria --cont Lipitor 40   Hematuria 2/2 penile trauma --per nursing, pt was confused post-op and pulled out his Foley.  Has been having hematuria since then. --urology consulted --hold plavix  Acute urinary  retention --PVR 699 on 11/11 --urology consulted, Foley inserted --start Flomax --f/u with [facility] for a voiding trial in a week or two   AKI --Cr up to 2.24 this morning.  Baseline around 1. --start LR@75  for 20 hours  Essential hypertension --BP has been low during hospitalization.   Hyperlipidemia --LDL 87.  switched home statin to lipitor 40   GERD --cont PPI  Hospital delirium --more confused since the surgery. --supportive care  Bilious vomiting --started morning of 11/10, resolved by next day --KUB showed mod stool burden in rectum --suppository ordered   DVT prophylaxis: SCD/Compression stockings Code Status: DNR  Family Communication:  Level of care: Med-Surg Dispo:   The patient is from: home Anticipated d/c is to: SNF rehab Anticipated d/c date is: 2-3 days, after AKI improves and hematuria resolves   Subjective and Interval History:  RN reported pt couldn't sleep last night and was agitated.  Trazodone given and pt was still sleeping during rounds.     Objective: Vitals:   03/23/23 0753 03/23/23 1231 03/23/23 1545 03/23/23 1945  BP: (!) 89/57 93/68 117/71 (!) 151/80  Pulse: 68 64 62 73  Resp: 15  19 18   Temp: 97.7 F (36.5 C) 97.6 F (36.4 C) 97.6 F (36.4 C) 98 F (36.7 C)  TempSrc:   Oral   SpO2: 97% 95% 99% 97%  Height:        Intake/Output Summary (Last 24 hours) at 03/23/2023 2024 Last data filed at 03/23/2023 1118 Gross per 24 hour  Intake 360 ml  Output 551 ml  Net -191 ml   There were no vitals filed for this visit.  Examination:   Constitutional: NAD, very sleepy HEENT: bruising around his neck CV: No cyanosis.   RESP: normal respiratory effort, on RA SKIN: warm, dry Foley present with dark urine output   Data Reviewed: I have personally reviewed labs and imaging studies  Time spent: 50 minutes  [name], MD [facility] If 7PM-7AM, please contact night-coverage 03/23/2023, 8:24 PM

## 2023-03-23 NOTE — Progress Notes (Signed)
Patient foley cather irrigated 100 ml in without difficulty but to pull it does have difficulty. Irrigated fluid is dark red.brownish red. 100 removed that's was instilled. Patient had no complaint of pain or discomfort last night.

## 2023-03-23 NOTE — TOC Progression Note (Signed)
Transition of Care [facility]) - Progression Note    Patient Details  Name: Thomas [name] MRN: [id number] Date of Birth: March 14, 1931  [facility]) CM/SW Contact  Allena Katz, LCSW Phone Number: 03/23/2023, 11:40 AM  Clinical Narrative:   CSW spoke with tim about additional bed offers. Tim reports they are going to go look at the facilities today and let me know which they chose. MD reports pt not yet ready to start auth.    Expected Discharge Plan: Skilled Nursing Facility Barriers to Discharge: Continued Medical Work up  Expected Discharge Plan and Services                                               Social Determinants of Health (SDOH) Interventions SDOH Screenings   Food Insecurity: No Food Insecurity (03/12/2023)  Housing: Low Risk  (03/12/2023)  Transportation Needs: No Transportation Needs (03/12/2023)  Utilities: Not At Risk (03/12/2023)  Financial Resource Strain: Low Risk  (10/12/2022)   Received from [facility]  Tobacco Use: Low Risk  (03/19/2023)  Health Literacy: High Risk (03/12/2023)   Received from [facility]    Readmission Risk Interventions     No data to display

## 2023-03-23 NOTE — Plan of Care (Signed)
  Problem: Education: Goal: Knowledge of disease or condition will improve Outcome: Progressing   Problem: Ischemic Stroke/TIA Tissue Perfusion: Goal: Complications of ischemic stroke/TIA will be minimized Outcome: Progressing   Problem: Coping: Goal: Will verbalize positive feelings about self Outcome: Progressing   Problem: Self-Care: Goal: Ability to participate in self-care as condition permits will improve Outcome: Progressing   Problem: Nutrition: Goal: Risk of aspiration will decrease Outcome: Progressing   Problem: Education: Goal: Knowledge of General Education information will improve Description: Including pain rating scale, medication(s)/side effects and non-pharmacologic comfort measures Outcome: Progressing   Problem: Health Behavior/Discharge Planning: Goal: Ability to manage health-related needs will improve Outcome: Progressing   Problem: Clinical Measurements: Goal: Ability to maintain clinical measurements within normal limits will improve Outcome: Progressing   Problem: Activity: Goal: Risk for activity intolerance will decrease Outcome: Progressing   Problem: Coping: Goal: Level of anxiety will decrease Outcome: Progressing   Problem: Elimination: Goal: Will not experience complications related to bowel motility Outcome: Progressing   Problem: Pain Management: Goal: General experience of comfort will improve Outcome: Progressing   Problem: Safety: Goal: Ability to remain free from injury will improve Outcome: Progressing   Problem: Skin Integrity: Goal: Risk for impaired skin integrity will decrease Outcome: Progressing   Problem: Skin Integrity: Goal: Demonstration of wound healing without infection will improve Outcome: Progressing

## 2023-03-24 ENCOUNTER — Telehealth: Payer: Self-pay | Admitting: Urology

## 2023-03-24 DIAGNOSIS — I639 Cerebral infarction, unspecified: Secondary | ICD-10-CM | POA: Diagnosis not present

## 2023-03-24 DIAGNOSIS — R310 Gross hematuria: Secondary | ICD-10-CM

## 2023-03-24 LAB — CBC
HCT: 30.5 % — ABNORMAL LOW (ref 39.0–52.0)
Hemoglobin: 9.8 g/dL — ABNORMAL LOW (ref 13.0–17.0)
MCH: 30.4 pg (ref 26.0–34.0)
MCHC: 32.1 g/dL (ref 30.0–36.0)
MCV: 94.7 fL (ref 80.0–100.0)
Platelets: 227 10*3/uL (ref 150–400)
RBC: 3.22 MIL/uL — ABNORMAL LOW (ref 4.22–5.81)
RDW: 13.6 % (ref 11.5–15.5)
WBC: 6.3 10*3/uL (ref 4.0–10.5)
nRBC: 0 % (ref 0.0–0.2)

## 2023-03-24 LAB — BASIC METABOLIC PANEL
Anion gap: 7 (ref 5–15)
BUN: 40 mg/dL — ABNORMAL HIGH (ref 8–23)
CO2: 25 mmol/L (ref 22–32)
Calcium: 8.3 mg/dL — ABNORMAL LOW (ref 8.9–10.3)
Chloride: 106 mmol/L (ref 98–111)
Creatinine, Ser: 1.94 mg/dL — ABNORMAL HIGH (ref 0.61–1.24)
GFR, Estimated: 32 mL/min — ABNORMAL LOW (ref >60)
Glucose, Bld: 84 mg/dL (ref 70–99)
Potassium: 3.5 mmol/L (ref 3.5–5.1)
Sodium: 138 mmol/L (ref 135–145)

## 2023-03-24 LAB — MAGNESIUM: Magnesium: 2.5 mg/dL — ABNORMAL HIGH (ref 1.7–2.4)

## 2023-03-24 NOTE — Plan of Care (Signed)
  Problem: Education: Goal: Knowledge of disease or condition will improve Outcome: Progressing Goal: Knowledge of secondary prevention will improve (MUST DOCUMENT ALL) Outcome: Progressing Goal: Knowledge of patient specific risk factors will improve Loraine Leriche N/A or DELETE if not current risk factor) Outcome: Progressing   Problem: Ischemic Stroke/TIA Tissue Perfusion: Goal: Complications of ischemic stroke/TIA will be minimized Outcome: Progressing   Problem: Coping: Goal: Will verbalize positive feelings about self Outcome: Progressing Goal: Will identify appropriate support needs Outcome: Progressing   Problem: Health Behavior/Discharge Planning: Goal: Ability to manage health-related needs will improve Outcome: Progressing Goal: Goals will be collaboratively established with patient/family Outcome: Progressing   Problem: Self-Care: Goal: Ability to participate in self-care as condition permits will improve Outcome: Progressing Goal: Verbalization of feelings and concerns over difficulty with self-care will improve Outcome: Progressing Goal: Ability to communicate needs accurately will improve Outcome: Progressing   Problem: Nutrition: Goal: Risk of aspiration will decrease Outcome: Progressing Goal: Dietary intake will improve Outcome: Progressing   Problem: Education: Goal: Knowledge of General Education information will improve Description: Including pain rating scale, medication(s)/side effects and non-pharmacologic comfort measures Outcome: Progressing   Problem: Health Behavior/Discharge Planning: Goal: Ability to manage health-related needs will improve Outcome: Progressing   Problem: Clinical Measurements: Goal: Ability to maintain clinical measurements within normal limits will improve Outcome: Progressing Goal: Will remain free from infection Outcome: Progressing Goal: Diagnostic test results will improve Outcome: Progressing Goal: Respiratory  complications will improve Outcome: Progressing Goal: Cardiovascular complication will be avoided Outcome: Progressing   Problem: Activity: Goal: Risk for activity intolerance will decrease Outcome: Progressing   Problem: Nutrition: Goal: Adequate nutrition will be maintained Outcome: Progressing   Problem: Coping: Goal: Level of anxiety will decrease Outcome: Progressing   Problem: Elimination: Goal: Will not experience complications related to bowel motility Outcome: Progressing Goal: Will not experience complications related to urinary retention Outcome: Progressing   Problem: Pain Management: Goal: General experience of comfort will improve Outcome: Progressing   Problem: Safety: Goal: Ability to remain free from injury will improve Outcome: Progressing   Problem: Skin Integrity: Goal: Risk for impaired skin integrity will decrease Outcome: Progressing   Problem: Education: Goal: Knowledge of discharge needs will improve Outcome: Progressing   Problem: Clinical Measurements: Goal: Postoperative complications will be avoided or minimized Outcome: Progressing   Problem: Respiratory: Goal: Ability to achieve and maintain a regular respiratory rate will improve Outcome: Progressing   Problem: Skin Integrity: Goal: Demonstration of wound healing without infection will improve Outcome: Progressing

## 2023-03-24 NOTE — Progress Notes (Signed)
PROGRESS NOTE    Thomas [name]  PPI:[id number] DOB: 12-26-1930 DOA: 03/11/2023 PCP: Barbette Reichmann, MD    Assessment & Plan:   Principal Problem:   Acute CVA (cerebrovascular accident) [facility])  Assessment and Plan: Acute CVA: presented 3 weeks after previous hospitalization for stroke and discharged from [facility]. Evidence of new acute CVA on imaging. S/p right carotid endarterectomy on 11/8. Continue on aspirin, statin. Holding plavix due to gross hematuria.   Hematuria: secondary penile trauma. As per nurse, pt was confused post-op and pulled out his Foley.  Has been having hematuria since then. Holding plavix. Uro following and recs apprec    Acute urinary retention: continue on flomax. Continue w/ foley. F/u with [facility] for a voiding trial in a week or two    AKI: Cr is trending down today. Baseline around 1.    HTN: BP is low end of normal currently    HLD: continue on statin    GERD: continue on PPI    Hospital delirium: continue w/ supportive care    Bilious vomiting:  started morning of 11/10, resolved by next day.        DVT prophylaxis: SCDs Code Status: DNR  Family Communication:  Disposition Plan: d/c to SNF   Level of care: Med-Surg Status is: Inpatient Remains inpatient appropriate because: still has hematuria    Consultants:  Urology  Neuro   Procedures:   Antimicrobials:   Subjective: Pt  c/o fatigue   Objective: Vitals:   03/23/23 1945 03/23/23 2345 03/24/23 0345 03/24/23 0731  BP: (!) 151/80 117/67 115/72 127/78  Pulse: 73 60 60 61  Resp: 18  16 16   Temp: 98 F (36.7 C) 97.8 F (36.6 C) 97.8 F (36.6 C) 97.7 F (36.5 C)  TempSrc:  Oral Oral Oral  SpO2: 97% 96% 95% 95%  Height:        Intake/Output Summary (Last 24 hours) at 03/24/2023 0817 Last data filed at 03/24/2023 0423 Gross per 24 hour  Intake 1036.18 ml  Output 375 ml  Net 661.18 ml   There were no vitals filed for this visit.  Examination:  General  exam: Appears calm and comfortable  Respiratory system: Clear to auscultation. Respiratory effort normal. Cardiovascular system: S1 & S2+. No rubs, gallops or clicks.  Gastrointestinal system: Abdomen is nondistended, soft and nontender. Normal bowel sounds heard. Central nervous system: Alert and awake.  Psychiatry: Judgement and insight appears improved. Flat mood and affect      Data Reviewed: I have personally reviewed following labs and imaging studies  CBC: Recent Labs  Lab 03/20/23 0412 03/23/23 0523 03/24/23 0433  WBC 8.3 9.6 6.3  HGB 12.4* 10.8* 9.8*  HCT 36.7* 32.8* 30.5*  MCV 90.4 91.6 94.7  PLT 218 257 227   Basic Metabolic Panel: Recent Labs  Lab 03/18/23 0447 03/20/23 0412 03/21/23 0250 03/23/23 0523 03/24/23 0433  NA  --  137  --  137 138  K  --  3.7  --  3.5 3.5  CL  --  108  --  106 106  CO2  --  21*  --  25 25  GLUCOSE  --  109*  --  112* 84  BUN  --  12  --  34* 40*  CREATININE 1.07 0.97  --  2.24* 1.94*  CALCIUM  --  8.1*  --  8.5* 8.3*  MG  --  1.9 2.0 2.6* 2.5*   GFR: CrCl cannot be calculated (Unknown ideal weight.).  Liver Function Tests: No results for input(s): "AST", "ALT", "ALKPHOS", "BILITOT", "PROT", "ALBUMIN" in the last 168 hours. No results for input(s): "LIPASE", "AMYLASE" in the last 168 hours. No results for input(s): "AMMONIA" in the last 168 hours. Coagulation Profile: No results for input(s): "INR", "PROTIME" in the last 168 hours. Cardiac Enzymes: No results for input(s): "CKTOTAL", "CKMB", "CKMBINDEX", "TROPONINI" in the last 168 hours. BNP (last 3 results) No results for input(s): "PROBNP" in the last 8760 hours. HbA1C: No results for input(s): "HGBA1C" in the last 72 hours. CBG: Recent Labs  Lab 03/19/23 1626  GLUCAP 119*   Lipid Profile: No results for input(s): "CHOL", "HDL", "LDLCALC", "TRIG", "CHOLHDL", "LDLDIRECT" in the last 72 hours. Thyroid Function Tests: No results for input(s): "TSH", "T4TOTAL",  "FREET4", "T3FREE", "THYROIDAB" in the last 72 hours. Anemia Panel: No results for input(s): "VITAMINB12", "FOLATE", "FERRITIN", "TIBC", "IRON", "RETICCTPCT" in the last 72 hours. Sepsis Labs: No results for input(s): "PROCALCITON", "LATICACIDVEN" in the last 168 hours.  Recent Results (from the past 240 hour(s))  MRSA Next Gen by PCR, Nasal     Status: None   Collection Time: 03/19/23  4:27 PM   Specimen: Nasal Mucosa; Nasal Swab  Result Value Ref Range Status   MRSA by PCR Next Gen NOT DETECTED NOT DETECTED Final    Comment: (NOTE) The GeneXpert MRSA Assay (FDA approved for NASAL specimens only), is one component of a comprehensive MRSA colonization surveillance program. It is not intended to diagnose MRSA infection nor to guide or monitor treatment for MRSA infections. Test performance is not FDA approved in patients less than 86 years old. Performed at [facility], [address], [place], [place] [address]          Radiology Studies: No results found.      Scheduled Meds:  aspirin EC  81 mg Oral Daily   atorvastatin  40 mg Oral Daily   Chlorhexidine Gluconate Cloth  6 each Topical Daily   docusate sodium  100 mg Oral Daily   pantoprazole  40 mg Oral QHS   sertraline  25 mg Oral Daily   sodium chloride flush  3 mL Intravenous Q12H   tamsulosin  0.4 mg Oral Daily   Continuous Infusions:   LOS: 8 days      Charise Killian, MD [facility] Pager [phone]  If 7PM-7AM, please contact night-coverage [email or web address]  03/24/2023, 8:17 AM

## 2023-03-24 NOTE — Telephone Encounter (Signed)
Unable to contact patient, no answer and no voice mail set up  Per Dr. Belva Crome message: This fellow will need an OV for a voiding trial in a week or two

## 2023-03-24 NOTE — Care Management Important Message (Signed)
Important Message  Patient Details  Name: Thomas [name] MRN: [id number] Date of Birth: 1930-09-05   Important Message Given:  Yes - Medicare IM     Olegario Messier A Mekaylah Klich 03/24/2023, 2:15 PM

## 2023-03-24 NOTE — TOC Progression Note (Signed)
Transition of Care [facility]) - Progression Note    Patient Details  Name: Thomas [name] MRN: [id number] Date of Birth: 18-Sep-1930  Transition of Care [facility]) CM/SW Contact  Allena Katz, LCSW Phone Number: 03/24/2023, 3:05 PM  Clinical Narrative:   TOC left voicemail with son to discuss bed offers.    Expected Discharge Plan: Skilled Nursing Facility Barriers to Discharge: Continued Medical Work up  Expected Discharge Plan and Services                                               Social Determinants of Health (SDOH) Interventions SDOH Screenings   Food Insecurity: No Food Insecurity (03/12/2023)  Housing: Low Risk  (03/12/2023)  Transportation Needs: No Transportation Needs (03/12/2023)  Utilities: Not At Risk (03/12/2023)  Financial Resource Strain: Low Risk  (10/12/2022)   Received from [facility]  Tobacco Use: Low Risk  (03/19/2023)  Health Literacy: High Risk (03/12/2023)   Received from [facility]    Readmission Risk Interventions     No data to display

## 2023-03-25 DIAGNOSIS — R310 Gross hematuria: Secondary | ICD-10-CM | POA: Diagnosis not present

## 2023-03-25 DIAGNOSIS — I639 Cerebral infarction, unspecified: Secondary | ICD-10-CM | POA: Diagnosis not present

## 2023-03-25 LAB — BASIC METABOLIC PANEL
Anion gap: 7 (ref 5–15)
BUN: 31 mg/dL — ABNORMAL HIGH (ref 8–23)
CO2: 27 mmol/L (ref 22–32)
Calcium: 8.5 mg/dL — ABNORMAL LOW (ref 8.9–10.3)
Chloride: 106 mmol/L (ref 98–111)
Creatinine, Ser: 1.24 mg/dL (ref 0.61–1.24)
GFR, Estimated: 55 mL/min — ABNORMAL LOW (ref >60)
Glucose, Bld: 95 mg/dL (ref 70–99)
Potassium: 3.7 mmol/L (ref 3.5–5.1)
Sodium: 140 mmol/L (ref 135–145)

## 2023-03-25 LAB — CBC
HCT: 30.9 % — ABNORMAL LOW (ref 39.0–52.0)
Hemoglobin: 10.3 g/dL — ABNORMAL LOW (ref 13.0–17.0)
MCH: 30.5 pg (ref 26.0–34.0)
MCHC: 33.3 g/dL (ref 30.0–36.0)
MCV: 91.4 fL (ref 80.0–100.0)
Platelets: 252 10*3/uL (ref 150–400)
RBC: 3.38 MIL/uL — ABNORMAL LOW (ref 4.22–5.81)
RDW: 13.2 % (ref 11.5–15.5)
WBC: 6.3 10*3/uL (ref 4.0–10.5)
nRBC: 0 % (ref 0.0–0.2)

## 2023-03-25 LAB — MAGNESIUM: Magnesium: 2.4 mg/dL (ref 1.7–2.4)

## 2023-03-25 NOTE — TOC Progression Note (Signed)
Transition of Care [facility]) - Progression Note    Patient Details  Name: Thomas [name] MRN: [id number] Date of Birth: 04-05-1931  Transition of Care [facility]) CM/SW Contact  Allena Katz, LCSW Phone Number: 03/25/2023, 4:49 PM  Clinical Narrative:   CSW spoke with tim who reports he toured all the facilites and is not happy with them. Reports he was told by tiffany at [facility] that they could take him. CSW has left a message with tiffanie.    Expected Discharge Plan: Skilled Nursing Facility Barriers to Discharge: Continued Medical Work up  Expected Discharge Plan and Services                                               Social Determinants of Health (SDOH) Interventions SDOH Screenings   Food Insecurity: No Food Insecurity (03/12/2023)  Housing: Low Risk  (03/12/2023)  Transportation Needs: No Transportation Needs (03/12/2023)  Utilities: Not At Risk (03/12/2023)  Financial Resource Strain: Low Risk  (10/12/2022)   Received from [facility]  Tobacco Use: Low Risk  (03/19/2023)  Health Literacy: High Risk (03/12/2023)   Received from [facility]    Readmission Risk Interventions     No data to display

## 2023-03-25 NOTE — Plan of Care (Signed)
  Problem: Education: Goal: Knowledge of disease or condition will improve Outcome: Progressing Goal: Knowledge of patient specific risk factors will improve Thomas [name] N/A or DELETE if not current risk factor) Outcome: Progressing   Problem: Self-Care: Goal: Ability to participate in self-care as condition permits will improve Outcome: Progressing   Problem: Education: Goal: Knowledge of General Education information will improve Description: Including pain rating scale, medication(s)/side effects and non-pharmacologic comfort measures Outcome: Progressing

## 2023-03-25 NOTE — Progress Notes (Signed)
PROGRESS NOTE    Thomas [name]  ZOX:[id number] DOB: Feb 10, 1931 DOA: 03/11/2023 PCP: Barbette Reichmann, MD    Assessment & Plan:   Principal Problem:   Acute CVA (cerebrovascular accident) [facility])  Assessment and Plan: Acute CVA: presented 3 weeks after previous hospitalization for stroke and discharged from [facility]. Evidence of new acute CVA on imaging. S/p right carotid endarterectomy on 11/8.Continue on statin, aspirin. Holding plavix due to gross hematuria   Hematuria: secondary penile trauma. Still w/ hematuria but improved. As per nurse, pt was confused post-op and pulled out his Foley.  Has been having hematuria since then. Holding plavix. Uro following and recs apprec    Acute urinary retention: continue w/ flomax. Continue w/ foley. F/u with [facility] for a voiding trial in a week or two    AKI: Cr is trending down again today   Normocytic anemia: H&H are labile. No need for a transfusion currently    HTN: metoprolol prn    HLD: continue on statin    GERD: continue on PPI    Hospital delirium: continue w/ supportive care    Bilious vomiting:  started morning of 11/10, resolved by next day.        DVT prophylaxis: SCDs Code Status: DNR  Family Communication:  Disposition Plan: d/c to SNF   Level of care: Med-Surg Status is: Inpatient Remains inpatient appropriate because: still w/ hematuria     Consultants:  Urology  Neuro   Procedures:   Antimicrobials:   Subjective: Pt c/o malaise   Objective: Vitals:   03/24/23 1541 03/24/23 1928 03/24/23 2330 03/25/23 0328  BP: 118/72 139/74 (!) 146/92 124/72  Pulse: 65 82 72 66  Resp: 18 18 18 18   Temp: 98.7 F (37.1 C) 98.2 F (36.8 C) 98.2 F (36.8 C) 98.1 F (36.7 C)  TempSrc: Oral  Oral   SpO2: 98% 93% 96% (!) 89%  Height:        Intake/Output Summary (Last 24 hours) at 03/25/2023 0818 Last data filed at 03/25/2023 8119 Gross per 24 hour  Intake --  Output 1025 ml  Net -1025 ml    There were no vitals filed for this visit.  Examination:  General exam: Appears comfortable  Respiratory system: clear breath sounds b/l  Cardiovascular system: S1/S2+. No rubs or clicks  Gastrointestinal system: abd is soft, NT, ND & hypoactive bowel sounds  Central nervous system: alert & awake  Psychiatry: Judgement and insight appears at baseline. Flat mood and affect     Data Reviewed: I have personally reviewed following labs and imaging studies  CBC: Recent Labs  Lab 03/20/23 0412 03/23/23 0523 03/24/23 0433 03/25/23 0500  WBC 8.3 9.6 6.3 6.3  HGB 12.4* 10.8* 9.8* 10.3*  HCT 36.7* 32.8* 30.5* 30.9*  MCV 90.4 91.6 94.7 91.4  PLT 218 257 227 252   Basic Metabolic Panel: Recent Labs  Lab 03/20/23 0412 03/21/23 0250 03/23/23 0523 03/24/23 0433 03/25/23 0500  NA 137  --  137 138 140  K 3.7  --  3.5 3.5 3.7  CL 108  --  106 106 106  CO2 21*  --  25 25 27   GLUCOSE 109*  --  112* 84 95  BUN 12  --  34* 40* 31*  CREATININE 0.97  --  2.24* 1.94* 1.24  CALCIUM 8.1*  --  8.5* 8.3* 8.5*  MG 1.9 2.0 2.6* 2.5* 2.4   GFR: CrCl cannot be calculated (Unknown ideal weight.). Liver Function Tests: No results  for input(s): "AST", "ALT", "ALKPHOS", "BILITOT", "PROT", "ALBUMIN" in the last 168 hours. No results for input(s): "LIPASE", "AMYLASE" in the last 168 hours. No results for input(s): "AMMONIA" in the last 168 hours. Coagulation Profile: No results for input(s): "INR", "PROTIME" in the last 168 hours. Cardiac Enzymes: No results for input(s): "CKTOTAL", "CKMB", "CKMBINDEX", "TROPONINI" in the last 168 hours. BNP (last 3 results) No results for input(s): "PROBNP" in the last 8760 hours. HbA1C: No results for input(s): "HGBA1C" in the last 72 hours. CBG: Recent Labs  Lab 03/19/23 1626  GLUCAP 119*   Lipid Profile: No results for input(s): "CHOL", "HDL", "LDLCALC", "TRIG", "CHOLHDL", "LDLDIRECT" in the last 72 hours. Thyroid Function Tests: No results for  input(s): "TSH", "T4TOTAL", "FREET4", "T3FREE", "THYROIDAB" in the last 72 hours. Anemia Panel: No results for input(s): "VITAMINB12", "FOLATE", "FERRITIN", "TIBC", "IRON", "RETICCTPCT" in the last 72 hours. Sepsis Labs: No results for input(s): "PROCALCITON", "LATICACIDVEN" in the last 168 hours.  Recent Results (from the past 240 hour(s))  MRSA Next Gen by PCR, Nasal     Status: None   Collection Time: 03/19/23  4:27 PM   Specimen: Nasal Mucosa; Nasal Swab  Result Value Ref Range Status   MRSA by PCR Next Gen NOT DETECTED NOT DETECTED Final    Comment: (NOTE) The GeneXpert MRSA Assay (FDA approved for NASAL specimens only), is one component of a comprehensive MRSA colonization surveillance program. It is not intended to diagnose MRSA infection nor to guide or monitor treatment for MRSA infections. Test performance is not FDA approved in patients less than 11 years old. Performed at [facility], [address], [place], [place] [address]          Radiology Studies: No results found.      Scheduled Meds:  aspirin EC  81 mg Oral Daily   atorvastatin  40 mg Oral Daily   Chlorhexidine Gluconate Cloth  6 each Topical Daily   docusate sodium  100 mg Oral Daily   pantoprazole  40 mg Oral QHS   sertraline  25 mg Oral Daily   sodium chloride flush  3 mL Intravenous Q12H   tamsulosin  0.4 mg Oral Daily   Continuous Infusions:   LOS: 9 days      Charise Killian, MD [facility] Pager [phone]  If 7PM-7AM, please contact night-coverage [email or web address]  03/25/2023, 8:18 AM

## 2023-03-26 DIAGNOSIS — R310 Gross hematuria: Secondary | ICD-10-CM | POA: Diagnosis not present

## 2023-03-26 DIAGNOSIS — I639 Cerebral infarction, unspecified: Secondary | ICD-10-CM | POA: Diagnosis not present

## 2023-03-26 LAB — CBC
HCT: 34.6 % — ABNORMAL LOW (ref 39.0–52.0)
Hemoglobin: 11.2 g/dL — ABNORMAL LOW (ref 13.0–17.0)
MCH: 30.3 pg (ref 26.0–34.0)
MCHC: 32.4 g/dL (ref 30.0–36.0)
MCV: 93.5 fL (ref 80.0–100.0)
Platelets: 281 10*3/uL (ref 150–400)
RBC: 3.7 MIL/uL — ABNORMAL LOW (ref 4.22–5.81)
RDW: 13.2 % (ref 11.5–15.5)
WBC: 6.8 10*3/uL (ref 4.0–10.5)
nRBC: 0 % (ref 0.0–0.2)

## 2023-03-26 LAB — BASIC METABOLIC PANEL
Anion gap: 8 (ref 5–15)
BUN: 17 mg/dL (ref 8–23)
CO2: 26 mmol/L (ref 22–32)
Calcium: 8.7 mg/dL — ABNORMAL LOW (ref 8.9–10.3)
Chloride: 104 mmol/L (ref 98–111)
Creatinine, Ser: 0.99 mg/dL (ref 0.61–1.24)
GFR, Estimated: >60 mL/min (ref >60)
Glucose, Bld: 105 mg/dL — ABNORMAL HIGH (ref 70–99)
Potassium: 3.5 mmol/L (ref 3.5–5.1)
Sodium: 138 mmol/L (ref 135–145)

## 2023-03-26 LAB — MAGNESIUM: Magnesium: 2.2 mg/dL (ref 1.7–2.4)

## 2023-03-26 NOTE — Plan of Care (Signed)
  Problem: Education: Goal: Knowledge of disease or condition will improve Outcome: Progressing Goal: Knowledge of secondary prevention will improve (MUST DOCUMENT ALL) Outcome: Progressing Goal: Knowledge of patient specific risk factors will improve Loraine Leriche N/A or DELETE if not current risk factor) Outcome: Progressing   Problem: Ischemic Stroke/TIA Tissue Perfusion: Goal: Complications of ischemic stroke/TIA will be minimized Outcome: Progressing   Problem: Coping: Goal: Will verbalize positive feelings about self Outcome: Progressing Goal: Will identify appropriate support needs Outcome: Progressing   Problem: Health Behavior/Discharge Planning: Goal: Ability to manage health-related needs will improve Outcome: Progressing Goal: Goals will be collaboratively established with patient/family Outcome: Progressing   Problem: Self-Care: Goal: Ability to participate in self-care as condition permits will improve Outcome: Progressing Goal: Verbalization of feelings and concerns over difficulty with self-care will improve Outcome: Progressing Goal: Ability to communicate needs accurately will improve Outcome: Progressing   Problem: Nutrition: Goal: Risk of aspiration will decrease Outcome: Progressing Goal: Dietary intake will improve Outcome: Progressing   Problem: Education: Goal: Knowledge of General Education information will improve Description: Including pain rating scale, medication(s)/side effects and non-pharmacologic comfort measures Outcome: Progressing   Problem: Health Behavior/Discharge Planning: Goal: Ability to manage health-related needs will improve Outcome: Progressing   Problem: Clinical Measurements: Goal: Ability to maintain clinical measurements within normal limits will improve Outcome: Progressing Goal: Will remain free from infection Outcome: Progressing Goal: Diagnostic test results will improve Outcome: Progressing Goal: Respiratory  complications will improve Outcome: Progressing Goal: Cardiovascular complication will be avoided Outcome: Progressing   Problem: Activity: Goal: Risk for activity intolerance will decrease Outcome: Progressing   Problem: Nutrition: Goal: Adequate nutrition will be maintained Outcome: Progressing   Problem: Coping: Goal: Level of anxiety will decrease Outcome: Progressing   Problem: Elimination: Goal: Will not experience complications related to bowel motility Outcome: Progressing Goal: Will not experience complications related to urinary retention Outcome: Progressing   Problem: Pain Management: Goal: General experience of comfort will improve Outcome: Progressing   Problem: Safety: Goal: Ability to remain free from injury will improve Outcome: Progressing   Problem: Skin Integrity: Goal: Risk for impaired skin integrity will decrease Outcome: Progressing   Problem: Education: Goal: Knowledge of discharge needs will improve Outcome: Progressing   Problem: Clinical Measurements: Goal: Postoperative complications will be avoided or minimized Outcome: Progressing   Problem: Respiratory: Goal: Ability to achieve and maintain a regular respiratory rate will improve Outcome: Progressing   Problem: Skin Integrity: Goal: Demonstration of wound healing without infection will improve Outcome: Progressing

## 2023-03-26 NOTE — Progress Notes (Signed)
PROGRESS NOTE    Thomas [name]  ZOX:[id number] DOB: 04/20/1931 DOA: 03/11/2023 PCP: Barbette Reichmann, MD    Assessment & Plan:   Principal Problem:   Acute CVA (cerebrovascular accident) [facility])  Assessment and Plan: Acute CVA: presented 3 weeks after previous hospitalization for stroke and discharged from [facility]. Evidence of new acute CVA on imaging. S/p right carotid endarterectomy on 11/8. Continue on aspirin, statin. Holding plavix due to gross hematuria  Hematuria: secondary penile trauma. Still has gross hematuria. As per nurse, pt was confused post-op and pulled out his Foley.  Has been having hematuria since then. Holding plavix. Uro following and recs apprec    Acute urinary retention: continue w/ flomax & foley. F/u with [facility] for a voiding trial in a week or two    AKI: Cr is trending down daily   Normocytic anemia: H&H are labile. Will transfuse if Hb < 7.0    HTN: metoprolol prn    HLD: continue on statin    GERD: continue on PPI    Hospital delirium: continue w/ supportive care    Bilious vomiting:  started morning of 11/10, resolved by next day.        DVT prophylaxis: SCDs Code Status: DNR  Family Communication:  Disposition Plan: d/c to SNF   Level of care: Med-Surg Status is: Inpatient Remains inpatient appropriate because: still w/ hematuria     Consultants:  Urology  Neuro   Procedures:   Antimicrobials:   Subjective: Pt c/o fatigue    Objective: Vitals:   03/25/23 1949 03/25/23 2349 03/26/23 0349 03/26/23 0749  BP: (!) 149/84 137/86 (!) 174/60 (!) 150/80  Pulse: 100 85 91 60  Resp: 18  18 18   Temp: (!) 97.5 F (36.4 C) 97.9 F (36.6 C) 98.3 F (36.8 C) (!) 97.5 F (36.4 C)  TempSrc:  Oral Oral Oral  SpO2: 95% 96% 97% 92%  Height:        Intake/Output Summary (Last 24 hours) at 03/26/2023 0818 Last data filed at 03/25/2023 2136 Gross per 24 hour  Intake --  Output 775 ml  Net -775 ml   There were no  vitals filed for this visit.  Examination:  General exam: Appears calm & comfortable   Respiratory system: clear breath sounds b/l  Cardiovascular system: S1 & S2+.  Gastrointestinal system: abd is soft, NT, ND & normal bowel sounds  Central nervous system: alert & awake  Psychiatry: Judgement and insight appears at baseline. Flat mood and affect    Data Reviewed: I have personally reviewed following labs and imaging studies  CBC: Recent Labs  Lab 03/20/23 0412 03/23/23 0523 03/24/23 0433 03/25/23 0500  WBC 8.3 9.6 6.3 6.3  HGB 12.4* 10.8* 9.8* 10.3*  HCT 36.7* 32.8* 30.5* 30.9*  MCV 90.4 91.6 94.7 91.4  PLT 218 257 227 252   Basic Metabolic Panel: Recent Labs  Lab 03/20/23 0412 03/21/23 0250 03/23/23 0523 03/24/23 0433 03/25/23 0500  NA 137  --  137 138 140  K 3.7  --  3.5 3.5 3.7  CL 108  --  106 106 106  CO2 21*  --  25 25 27   GLUCOSE 109*  --  112* 84 95  BUN 12  --  34* 40* 31*  CREATININE 0.97  --  2.24* 1.94* 1.24  CALCIUM 8.1*  --  8.5* 8.3* 8.5*  MG 1.9 2.0 2.6* 2.5* 2.4   GFR: CrCl cannot be calculated (Unknown ideal weight.). Liver Function Tests: No  results for input(s): "AST", "ALT", "ALKPHOS", "BILITOT", "PROT", "ALBUMIN" in the last 168 hours. No results for input(s): "LIPASE", "AMYLASE" in the last 168 hours. No results for input(s): "AMMONIA" in the last 168 hours. Coagulation Profile: No results for input(s): "INR", "PROTIME" in the last 168 hours. Cardiac Enzymes: No results for input(s): "CKTOTAL", "CKMB", "CKMBINDEX", "TROPONINI" in the last 168 hours. BNP (last 3 results) No results for input(s): "PROBNP" in the last 8760 hours. HbA1C: No results for input(s): "HGBA1C" in the last 72 hours. CBG: Recent Labs  Lab 03/19/23 1626  GLUCAP 119*   Lipid Profile: No results for input(s): "CHOL", "HDL", "LDLCALC", "TRIG", "CHOLHDL", "LDLDIRECT" in the last 72 hours. Thyroid Function Tests: No results for input(s): "TSH", "T4TOTAL",  "FREET4", "T3FREE", "THYROIDAB" in the last 72 hours. Anemia Panel: No results for input(s): "VITAMINB12", "FOLATE", "FERRITIN", "TIBC", "IRON", "RETICCTPCT" in the last 72 hours. Sepsis Labs: No results for input(s): "PROCALCITON", "LATICACIDVEN" in the last 168 hours.  Recent Results (from the past 240 hour(s))  MRSA Next Gen by PCR, Nasal     Status: None   Collection Time: 03/19/23  4:27 PM   Specimen: Nasal Mucosa; Nasal Swab  Result Value Ref Range Status   MRSA by PCR Next Gen NOT DETECTED NOT DETECTED Final    Comment: (NOTE) The GeneXpert MRSA Assay (FDA approved for NASAL specimens only), is one component of a comprehensive MRSA colonization surveillance program. It is not intended to diagnose MRSA infection nor to guide or monitor treatment for MRSA infections. Test performance is not FDA approved in patients less than 21 years old. Performed at [facility], [address], [place], [place] [address]          Radiology Studies: No results found.      Scheduled Meds:  aspirin EC  81 mg Oral Daily   atorvastatin  40 mg Oral Daily   Chlorhexidine Gluconate Cloth  6 each Topical Daily   docusate sodium  100 mg Oral Daily   pantoprazole  40 mg Oral QHS   sertraline  25 mg Oral Daily   sodium chloride flush  3 mL Intravenous Q12H   tamsulosin  0.4 mg Oral Daily   Continuous Infusions:   LOS: 10 days      Charise Killian, MD [facility] Pager [phone]  If 7PM-7AM, please contact night-coverage [email or web address]  03/26/2023, 8:18 AM

## 2023-03-27 DIAGNOSIS — R310 Gross hematuria: Secondary | ICD-10-CM | POA: Diagnosis not present

## 2023-03-27 DIAGNOSIS — I639 Cerebral infarction, unspecified: Secondary | ICD-10-CM | POA: Diagnosis not present

## 2023-03-27 LAB — BASIC METABOLIC PANEL
Anion gap: 9 (ref 5–15)
BUN: 19 mg/dL (ref 8–23)
CO2: 26 mmol/L (ref 22–32)
Calcium: 8.6 mg/dL — ABNORMAL LOW (ref 8.9–10.3)
Chloride: 103 mmol/L (ref 98–111)
Creatinine, Ser: 1.1 mg/dL (ref 0.61–1.24)
GFR, Estimated: >60 mL/min (ref >60)
Glucose, Bld: 89 mg/dL (ref 70–99)
Potassium: 3.7 mmol/L (ref 3.5–5.1)
Sodium: 138 mmol/L (ref 135–145)

## 2023-03-27 LAB — MAGNESIUM: Magnesium: 2.1 mg/dL (ref 1.7–2.4)

## 2023-03-27 LAB — CBC
HCT: 30.8 % — ABNORMAL LOW (ref 39.0–52.0)
Hemoglobin: 10.2 g/dL — ABNORMAL LOW (ref 13.0–17.0)
MCH: 30.2 pg (ref 26.0–34.0)
MCHC: 33.1 g/dL (ref 30.0–36.0)
MCV: 91.1 fL (ref 80.0–100.0)
Platelets: 260 10*3/uL (ref 150–400)
RBC: 3.38 MIL/uL — ABNORMAL LOW (ref 4.22–5.81)
RDW: 13.2 % (ref 11.5–15.5)
WBC: 6.8 10*3/uL (ref 4.0–10.5)
nRBC: 0 % (ref 0.0–0.2)

## 2023-03-27 MED ORDER — POLYETHYLENE GLYCOL 3350 17 G PO PACK
17.0000 g | PACK | Freq: Every day | ORAL | Status: DC
  Administered 2023-03-27 – 2023-04-04 (×9): 17 g via ORAL
  Filled 2023-03-27 (×9): qty 1

## 2023-03-27 NOTE — Plan of Care (Signed)
  Problem: Education: Goal: Knowledge of disease or condition will improve Outcome: Progressing Goal: Knowledge of secondary prevention will improve (MUST DOCUMENT ALL) Outcome: Progressing Goal: Knowledge of patient specific risk factors will improve Loraine Leriche N/A or DELETE if not current risk factor) Outcome: Progressing   Problem: Ischemic Stroke/TIA Tissue Perfusion: Goal: Complications of ischemic stroke/TIA will be minimized Outcome: Progressing   Problem: Coping: Goal: Will verbalize positive feelings about self Outcome: Progressing Goal: Will identify appropriate support needs Outcome: Progressing   Problem: Health Behavior/Discharge Planning: Goal: Ability to manage health-related needs will improve Outcome: Progressing Goal: Goals will be collaboratively established with patient/family Outcome: Progressing   Problem: Self-Care: Goal: Ability to participate in self-care as condition permits will improve Outcome: Progressing Goal: Verbalization of feelings and concerns over difficulty with self-care will improve Outcome: Progressing Goal: Ability to communicate needs accurately will improve Outcome: Progressing   Problem: Nutrition: Goal: Risk of aspiration will decrease Outcome: Progressing Goal: Dietary intake will improve Outcome: Progressing   Problem: Education: Goal: Knowledge of General Education information will improve Description: Including pain rating scale, medication(s)/side effects and non-pharmacologic comfort measures Outcome: Progressing   Problem: Health Behavior/Discharge Planning: Goal: Ability to manage health-related needs will improve Outcome: Progressing   Problem: Clinical Measurements: Goal: Ability to maintain clinical measurements within normal limits will improve Outcome: Progressing Goal: Will remain free from infection Outcome: Progressing Goal: Diagnostic test results will improve Outcome: Progressing Goal: Respiratory  complications will improve Outcome: Progressing Goal: Cardiovascular complication will be avoided Outcome: Progressing   Problem: Activity: Goal: Risk for activity intolerance will decrease Outcome: Progressing   Problem: Nutrition: Goal: Adequate nutrition will be maintained Outcome: Progressing   Problem: Coping: Goal: Level of anxiety will decrease Outcome: Progressing   Problem: Elimination: Goal: Will not experience complications related to bowel motility Outcome: Progressing Goal: Will not experience complications related to urinary retention Outcome: Progressing   Problem: Pain Management: Goal: General experience of comfort will improve Outcome: Progressing   Problem: Safety: Goal: Ability to remain free from injury will improve Outcome: Progressing   Problem: Skin Integrity: Goal: Risk for impaired skin integrity will decrease Outcome: Progressing   Problem: Education: Goal: Knowledge of discharge needs will improve Outcome: Progressing   Problem: Clinical Measurements: Goal: Postoperative complications will be avoided or minimized Outcome: Progressing   Problem: Respiratory: Goal: Ability to achieve and maintain a regular respiratory rate will improve Outcome: Progressing   Problem: Skin Integrity: Goal: Demonstration of wound healing without infection will improve Outcome: Progressing

## 2023-03-27 NOTE — Progress Notes (Signed)
PROGRESS NOTE    Thomas [name]  AVW:[id number] DOB: 14-Oct-1930 DOA: 03/11/2023 PCP: Barbette Reichmann, MD    Assessment & Plan:   Principal Problem:   Acute CVA (cerebrovascular accident) [facility])  Assessment and Plan: Acute CVA: presented 3 weeks after previous hospitalization for stroke and discharged from [facility]. Evidence of new acute CVA on imaging. S/p right carotid endarterectomy on 11/8. Continue on statin, aspirin. Holding plavix due to gross hematuria.   Hematuria: secondary penile trauma. Still has gross hematuria but much improved today. As per nurse, pt was confused post-op and pulled out his Foley.  Has been having hematuria since then. Holding plavix. Uro following and recs apprec    Acute urinary retention: continue w/ flomax & foley. F/u with [facility] for a voiding trial in a week or two    AKI: resolved   Normocytic anemia: H&H are labile. No need for a transfusion currelty    HTN: metoprolol prn    HLD: continue on statin    GERD: continue w/ PPI    Hospital delirium: continue w/ supportive care    Bilious vomiting:  started morning of 11/10, resolved by next day.   Constipation: continue on colace and started on miralax      DVT prophylaxis: SCDs Code Status: DNR  Family Communication:  Disposition Plan: d/c to SNF   Level of care: Med-Surg Status is: Inpatient Remains inpatient appropriate because: still w/ hematuria but much improved today     Consultants:  Urology  Neuro   Procedures:   Antimicrobials:   Subjective: Pt c/o malaise   Objective: Vitals:   03/26/23 1557 03/26/23 2007 03/27/23 0515 03/27/23 0825  BP: 111/66 125/85 119/82 132/70  Pulse: 81 71 (!) 55 (!) 53  Resp: 17   16  Temp: 99.4 F (37.4 C) 98 F (36.7 C) 98.2 F (36.8 C) 97.8 F (36.6 C)  TempSrc:  Oral    SpO2: 94% 95% 98% 94%  Height:        Intake/Output Summary (Last 24 hours) at 03/27/2023 0856 Last data filed at 03/27/2023 8295 Gross per 24  hour  Intake --  Output 1850 ml  Net -1850 ml   There were no vitals filed for this visit.  Examination:  General exam: Appears comfortable   Respiratory system: clear breath sounds b/l  Cardiovascular system: S1/S2+. No rubs or clicks   Gastrointestinal system: abd is soft, NT, ND & hypoactive bowel sounds  Central nervous system: alert & awake  Psychiatry: Judgement and insight appear at baseline. Flat mood and affect    Data Reviewed: I have personally reviewed following labs and imaging studies  CBC: Recent Labs  Lab 03/23/23 0523 03/24/23 0433 03/25/23 0500 03/26/23 1512 03/27/23 0457  WBC 9.6 6.3 6.3 6.8 6.8  HGB 10.8* 9.8* 10.3* 11.2* 10.2*  HCT 32.8* 30.5* 30.9* 34.6* 30.8*  MCV 91.6 94.7 91.4 93.5 91.1  PLT 257 227 252 281 260   Basic Metabolic Panel: Recent Labs  Lab 03/23/23 0523 03/24/23 0433 03/25/23 0500 03/26/23 1512 03/27/23 0457  NA 137 138 140 138 138  K 3.5 3.5 3.7 3.5 3.7  CL 106 106 106 104 103  CO2 25 25 27 26 26   GLUCOSE 112* 84 95 105* 89  BUN 34* 40* 31* 17 19  CREATININE 2.24* 1.94* 1.24 0.99 1.10  CALCIUM 8.5* 8.3* 8.5* 8.7* 8.6*  MG 2.6* 2.5* 2.4 2.2 2.1   GFR: CrCl cannot be calculated (Unknown ideal weight.). Liver Function Tests:  No results for input(s): "AST", "ALT", "ALKPHOS", "BILITOT", "PROT", "ALBUMIN" in the last 168 hours. No results for input(s): "LIPASE", "AMYLASE" in the last 168 hours. No results for input(s): "AMMONIA" in the last 168 hours. Coagulation Profile: No results for input(s): "INR", "PROTIME" in the last 168 hours. Cardiac Enzymes: No results for input(s): "CKTOTAL", "CKMB", "CKMBINDEX", "TROPONINI" in the last 168 hours. BNP (last 3 results) No results for input(s): "PROBNP" in the last 8760 hours. HbA1C: No results for input(s): "HGBA1C" in the last 72 hours. CBG: No results for input(s): "GLUCAP" in the last 168 hours.  Lipid Profile: No results for input(s): "CHOL", "HDL", "LDLCALC",  "TRIG", "CHOLHDL", "LDLDIRECT" in the last 72 hours. Thyroid Function Tests: No results for input(s): "TSH", "T4TOTAL", "FREET4", "T3FREE", "THYROIDAB" in the last 72 hours. Anemia Panel: No results for input(s): "VITAMINB12", "FOLATE", "FERRITIN", "TIBC", "IRON", "RETICCTPCT" in the last 72 hours. Sepsis Labs: No results for input(s): "PROCALCITON", "LATICACIDVEN" in the last 168 hours.  Recent Results (from the past 240 hour(s))  MRSA Next Gen by PCR, Nasal     Status: None   Collection Time: 03/19/23  4:27 PM   Specimen: Nasal Mucosa; Nasal Swab  Result Value Ref Range Status   MRSA by PCR Next Gen NOT DETECTED NOT DETECTED Final    Comment: (NOTE) The GeneXpert MRSA Assay (FDA approved for NASAL specimens only), is one component of a comprehensive MRSA colonization surveillance program. It is not intended to diagnose MRSA infection nor to guide or monitor treatment for MRSA infections. Test performance is not FDA approved in patients less than 62 years old. Performed at [facility], [address], [place], [place] [address]          Radiology Studies: No results found.      Scheduled Meds:  aspirin EC  81 mg Oral Daily   atorvastatin  40 mg Oral Daily   Chlorhexidine Gluconate Cloth  6 each Topical Daily   docusate sodium  100 mg Oral Daily   pantoprazole  40 mg Oral QHS   sertraline  25 mg Oral Daily   sodium chloride flush  3 mL Intravenous Q12H   tamsulosin  0.4 mg Oral Daily   Continuous Infusions:   LOS: 11 days      Charise Killian, MD [facility] Pager [phone]  If 7PM-7AM, please contact night-coverage [email or web address]  03/27/2023, 8:56 AM

## 2023-03-28 DIAGNOSIS — R310 Gross hematuria: Secondary | ICD-10-CM | POA: Diagnosis not present

## 2023-03-28 DIAGNOSIS — I639 Cerebral infarction, unspecified: Secondary | ICD-10-CM | POA: Diagnosis not present

## 2023-03-28 MED ORDER — LACTULOSE ENEMA: 300.0000 mL | Freq: Once | ORAL | Status: DC

## 2023-03-28 MED ORDER — LACTULOSE 10 GM/15ML PO SOLN
20.0000 g | Freq: Two times a day (BID) | ORAL | Status: DC
  Administered 2023-03-28 – 2023-04-06 (×19): 20 g via ORAL
  Filled 2023-03-28 (×21): qty 30

## 2023-03-28 MED ORDER — FLEET ENEMA RE ENEM
1.0000 | ENEMA | Freq: Once | RECTAL | Status: AC
  Administered 2023-03-28: 1 via RECTAL

## 2023-03-28 NOTE — Progress Notes (Signed)
Per Dr Mayford Knife, dc stroke/NIH orders

## 2023-03-28 NOTE — Plan of Care (Signed)
  Problem: Education: Goal: Knowledge of disease or condition will improve Outcome: Progressing Goal: Knowledge of secondary prevention will improve (MUST DOCUMENT ALL) Outcome: Progressing   Problem: Ischemic Stroke/TIA Tissue Perfusion: Goal: Complications of ischemic stroke/TIA will be minimized Outcome: Progressing   Problem: Coping: Goal: Will verbalize positive feelings about self Outcome: Progressing

## 2023-03-28 NOTE — Progress Notes (Signed)
PROGRESS NOTE    Thomas [name]  ZOX:[id number] DOB: [birth year] DOA: 03/11/2023 PCP: Barbette Reichmann, MD    Assessment & Plan:   Principal Problem:   Acute CVA (cerebrovascular accident) [facility])  Assessment and Plan: Acute CVA: presented 3 weeks after previous hospitalization for stroke and discharged from [facility]. Evidence of new acute CVA on imaging. S/p right carotid endarterectomy on 11/8. Continue on aspirin, statin. Holding plavix secondary to gross hematuria  Hematuria: secondary penile trauma. Gross hematuria has improved but still present. As per nurse, pt was confused post-op and pulled out his Foley.  Has been having hematuria since then. Holding plavix. Uro following and recs apprec    Acute urinary retention: continue w/ foley & flomax. F/u with [facility] for a voiding trial in a week or two    AKI: resolved   Normocytic anemia: H&H are labile. Will transfuse if Hb < 7.0    HTN: metoprolol prn    HLD: continue w/ statin    GERD: continue w/ PPI     Hospital delirium: continue w/ supportive care    Bilious vomiting:  started morning of 11/10, resolved by next day.   Constipation: continue on colace, miralax      DVT prophylaxis: SCDs Code Status: DNR  Family Communication: discussed pt's care w/ pt's sister at bedside and answered her questions  Disposition Plan: d/c to SNF   Level of care: Med-Surg Status is: Inpatient Remains inpatient appropriate because: still w/ hematuria but much improved    Consultants:  Urology  Neuro   Procedures:   Antimicrobials:   Subjective: Pt c/o fatigue   Objective: Vitals:   03/27/23 1601 03/27/23 2018 03/28/23 0419 03/28/23 0800  BP: (!) 138/104 (!) 143/84 (!) 149/83 128/82  Pulse: 82 73 63 61  Resp: 18 18  18   Temp: 98.7 F (37.1 C) 97.8 F (36.6 C) 98.2 F (36.8 C) 97.6 F (36.4 C)  TempSrc:      SpO2: 96% 97% 97% 97%  Height:        Intake/Output Summary (Last 24 hours) at 03/28/2023  0811 Last data filed at 03/28/2023 0735 Gross per 24 hour  Intake --  Output 450 ml  Net -450 ml   There were no vitals filed for this visit.  Examination:  General exam: appears calm & comfortable    Respiratory system: clear breath sounds b/l  Cardiovascular system: S1/S2+. No rubs or clicks  Gastrointestinal system: abd is soft, NT, ND & hypoactive bowel sounds Central nervous system: alert & awake  Psychiatry: judgement and insight appears at baseline. Flat mood and affect     Data Reviewed: I have personally reviewed following labs and imaging studies  CBC: Recent Labs  Lab 03/23/23 0523 03/24/23 0433 03/25/23 0500 03/26/23 1512 03/27/23 0457  WBC 9.6 6.3 6.3 6.8 6.8  HGB 10.8* 9.8* 10.3* 11.2* 10.2*  HCT 32.8* 30.5* 30.9* 34.6* 30.8*  MCV 91.6 94.7 91.4 93.5 91.1  PLT 257 227 252 281 260   Basic Metabolic Panel: Recent Labs  Lab 03/23/23 0523 03/24/23 0433 03/25/23 0500 03/26/23 1512 03/27/23 0457  NA 137 138 140 138 138  K 3.5 3.5 3.7 3.5 3.7  CL 106 106 106 104 103  CO2 25 25 27 26 26   GLUCOSE 112* 84 95 105* 89  BUN 34* 40* 31* 17 19  CREATININE 2.24* 1.94* 1.24 0.99 1.10  CALCIUM 8.5* 8.3* 8.5* 8.7* 8.6*  MG 2.6* 2.5* 2.4 2.2 2.1   GFR: CrCl  cannot be calculated (Unknown ideal weight.). Liver Function Tests: No results for input(s): "AST", "ALT", "ALKPHOS", "BILITOT", "PROT", "ALBUMIN" in the last 168 hours. No results for input(s): "LIPASE", "AMYLASE" in the last 168 hours. No results for input(s): "AMMONIA" in the last 168 hours. Coagulation Profile: No results for input(s): "INR", "PROTIME" in the last 168 hours. Cardiac Enzymes: No results for input(s): "CKTOTAL", "CKMB", "CKMBINDEX", "TROPONINI" in the last 168 hours. BNP (last 3 results) No results for input(s): "PROBNP" in the last 8760 hours. HbA1C: No results for input(s): "HGBA1C" in the last 72 hours. CBG: No results for input(s): "GLUCAP" in the last 168 hours.  Lipid  Profile: No results for input(s): "CHOL", "HDL", "LDLCALC", "TRIG", "CHOLHDL", "LDLDIRECT" in the last 72 hours. Thyroid Function Tests: No results for input(s): "TSH", "T4TOTAL", "FREET4", "T3FREE", "THYROIDAB" in the last 72 hours. Anemia Panel: No results for input(s): "VITAMINB12", "FOLATE", "FERRITIN", "TIBC", "IRON", "RETICCTPCT" in the last 72 hours. Sepsis Labs: No results for input(s): "PROCALCITON", "LATICACIDVEN" in the last 168 hours.  Recent Results (from the past 240 hour(s))  MRSA Next Gen by PCR, Nasal     Status: None   Collection Time: 03/19/23  4:27 PM   Specimen: Nasal Mucosa; Nasal Swab  Result Value Ref Range Status   MRSA by PCR Next Gen NOT DETECTED NOT DETECTED Final    Comment: (NOTE) The GeneXpert MRSA Assay (FDA approved for NASAL specimens only), is one component of a comprehensive MRSA colonization surveillance program. It is not intended to diagnose MRSA infection nor to guide or monitor treatment for MRSA infections. Test performance is not FDA approved in patients less than 64 years old. Performed at [facility], [address], [place], [place] [address]          Radiology Studies: No results found.      Scheduled Meds:  aspirin EC  81 mg Oral Daily   atorvastatin  40 mg Oral Daily   Chlorhexidine Gluconate Cloth  6 each Topical Daily   docusate sodium  100 mg Oral Daily   pantoprazole  40 mg Oral QHS   polyethylene glycol  17 g Oral Daily   sertraline  25 mg Oral Daily   sodium chloride flush  3 mL Intravenous Q12H   tamsulosin  0.4 mg Oral Daily   Continuous Infusions:   LOS: 12 days      Charise Killian, MD [facility] Pager [phone]  If 7PM-7AM, please contact night-coverage [email or web address]  03/28/2023, 8:11 AM

## 2023-03-28 NOTE — Plan of Care (Signed)
  Problem: Ischemic Stroke/TIA Tissue Perfusion: Goal: Complications of ischemic stroke/TIA will be minimized Outcome: Progressing   Problem: Coping: Goal: Will verbalize positive feelings about self Outcome: Progressing   Problem: Health Behavior/Discharge Planning: Goal: Ability to manage health-related needs will improve Outcome: Progressing   Problem: Health Behavior/Discharge Planning: Goal: Goals will be collaboratively established with patient/family Outcome: Progressing   Problem: Clinical Measurements: Goal: Will remain free from infection Outcome: Progressing   Problem: Clinical Measurements: Goal: Respiratory complications will improve Outcome: Progressing

## 2023-03-29 DIAGNOSIS — I639 Cerebral infarction, unspecified: Secondary | ICD-10-CM | POA: Diagnosis not present

## 2023-03-29 DIAGNOSIS — N472 Paraphimosis: Secondary | ICD-10-CM

## 2023-03-29 DIAGNOSIS — R310 Gross hematuria: Secondary | ICD-10-CM | POA: Diagnosis not present

## 2023-03-29 MED ORDER — HYDROMORPHONE HCL 1 MG/ML IJ SOLN
1.0000 mg | Freq: Once | INTRAMUSCULAR | Status: AC
  Administered 2023-03-29: 1 mg via INTRAVENOUS
  Filled 2023-03-29: qty 1

## 2023-03-29 NOTE — Care Management Important Message (Signed)
Important Message  Patient Details  Name: KWEKU WOLFENBARGER MRN: [id number] Date of Birth: 08-25-1930   Important Message Given:  Yes - Medicare IM     Olegario Messier A [place] 03/29/2023, 2:51 PM

## 2023-03-29 NOTE — Evaluation (Signed)
Occupational Therapy Re-Evaluation Patient Details Name: Thomas [name] MRN: [id number] DOB: [birth year] Today's Date: 03/29/2023   History of Present Illness Pt is a 89 y.o. male presenting to hospital 03/11/23 with generalized weakness, dizziness, and being off balance; came home from [facility] rehab 7 days prior after multifocal small vessel infarcts.  Imaging showing acute infarct of R anterior thalamus.  Pt admitted with acute CVA.  S/p 03/19/23 R CEA (PT/OT orders discontinued 03/19/23). Pt also noted with hematuria and acute urinary retention.  New PT/OT orders received 03/29/23.  PMH includes recent stroke 3 weeks ago, htn, HLD.   Clinical Impression   Pt was seen for OT re-evaluation and co-tx with PT this date. Pt received on [facility] with PT and agreeable to session. Pt demonstrating delayed cognitive processing and problem solving this date, continues to have L side weakness and decreased FMC during grooming tasks. MAX A for STS pericare and requiring heavy +2 assist for ADL transfers and taking steps. Pt intermittently tearful/labile and emotional support and active listening utilized. Pt presents to acute OT demonstrating impaired ADL performance and functional mobility 2/2 decreased strength, FMC, cognition, balance, and activity tolerance (See OT problem list for additional functional deficits). Pt would benefit from skilled OT services to address noted impairments and functional limitations (see below for any additional details) in order to maximize safety and independence while minimizing falls risk and caregiver burden.     If plan is discharge home, recommend the following: A lot of help with walking and/or transfers;A lot of help with bathing/dressing/bathroom;Supervision due to cognitive status;Direct supervision/assist for financial management;Assistance with cooking/housework;Assist for transportation;Help with stairs or ramp for entrance;Direct supervision/assist for medications management     Functional Status Assessment  Patient has had a recent decline in their functional status and demonstrates the ability to make significant improvements in function in a reasonable and predictable amount of time.  Equipment Recommendations  Other (comment) (defer)    Recommendations for Other Services       Precautions / Restrictions Precautions Precautions: Fall Precaution Comments: L lat tibial wound Restrictions Weight Bearing Restrictions: No      Mobility Bed Mobility Overal bed mobility: Needs Assistance Bed Mobility: Sit to Supine       Sit to supine: Max assist, +2 for physical assistance   General bed mobility comments: assist for trunk and B LE's; vc's for technique    Transfers Overall transfer level: Needs assistance Equipment used: Rolling walker (2 wheels) Transfers: Sit to/from Stand Sit to Stand: Mod assist, Max assist, +2 physical assistance           General transfer comment: assist to initiate and come to full stand from [facility]; vc's for UE/LE placement; assist to control descent sitting      Balance Overall balance assessment: Needs assistance Sitting-balance support: Bilateral upper extremity supported, Feet supported Sitting balance-[name]: Fair Sitting balance - Comments: steady static sitting; mild L lean noted Postural control: Left lateral lean Standing balance support: Bilateral upper extremity supported, Reliant on assistive device for balance, During functional activity Standing balance-[name]: Poor Standing balance comment: mild L lean noted in standing with heavy UE support through RW                           ADL either performed or assessed with clinical judgement   ADL Overall ADL's : Needs assistance/impaired     Grooming: Sitting;Minimal assistance;Moderate assistance;Wash/dry face Grooming Details (indicate cue type  and reason): MIN A for washing face, MOD A for donning and doffing glasses incorporating  L hand into task                 Toilet Transfer: Moderate assistance;Maximal assistance;+2 for physical assistance;Rolling walker (2 wheels);BSC/3in1   Toileting- Clothing Manipulation and Hygiene: Maximal assistance;Sit to/from stand               Vision         Perception         Praxis         Pertinent Vitals/Pain Pain Assessment Pain Assessment: No/denies pain     Extremity/Trunk Assessment Upper Extremity Assessment Upper Extremity Assessment: LUE deficits/detail LUE Deficits / Details: grossly 4-/5 LUE Sensation:  (difficult to formally assess 2/2 cognition) LUE Coordination: decreased gross motor;decreased fine motor   Lower Extremity Assessment Lower Extremity Assessment: Defer to PT evaluation RLE Deficits / Details: hip flexion 4+/5; knee flexion/extension 4+/5; DF 4/5 LLE Deficits / Details: hip flexion 4-/5; knee flexion/extension 4-/5; DF 4/5       Communication Communication Communication: Difficulty following commands/understanding Following commands: Follows one step commands consistently Cueing Techniques: Verbal cues;Visual cues   Cognition Arousal: Alert Behavior During Therapy: Lability (Occasionally teary regarding status (therapeutic listening and support provided)) Overall Cognitive Status: No family/caregiver present to determine baseline cognitive functioning Area of Impairment: Following commands, Safety/judgement, Orientation, Problem solving                 Orientation Level: Disoriented to, Time (pt reported it was beginning of November 1950)   Memory: Decreased short-term memory Following Commands: Follows one step commands with increased time Safety/Judgement: Decreased awareness of deficits   Problem Solving: Slow processing, Requires verbal cues, Requires tactile cues, Decreased initiation, Difficulty sequencing       General Comments       Exercises     Shoulder Instructions      Home Living  Family/patient expects to be discharged to:: Private residence Living Arrangements: Alone Available Help at Discharge: Family Type of Home: House Home Access: Stairs to enter Secretary/administrator of Steps: 2   Home Layout: One level     Bathroom Shower/Tub: Chief Strategy Officer: Standard     Home Equipment: Pharmacist, hospital (2 wheels)   Additional Comments:  (Pt reports living with his mother and sister in 1 level home with 1 STE R railing; h/o falls.)  Lives With: Alone    Prior Functioning/Environment Prior Level of Function : Patient poor historian/Family not available             Mobility Comments: Ambulates with RW. (pt reports ambulatory with walker; h/o falls) ADLs Comments: Per chart review: Able to preform basic ADL's        OT Problem List: Decreased strength;Decreased safety awareness;Decreased activity tolerance;Impaired balance (sitting and/or standing);Decreased knowledge of use of DME or AE;Decreased cognition;Decreased coordination;Impaired UE functional use      OT Treatment/Interventions: Self-care/ADL training;Therapeutic exercise;Neuromuscular education;DME and/or AE instruction;Manual therapy;Balance training;Patient/family education;Energy conservation;Therapeutic activities;Cognitive remediation/compensation    OT Goals(Current goals can be found in the care plan section) Acute Rehab OT Goals Patient Stated Goal: get better OT Goal Formulation: With patient Time For Goal Achievement: 04/12/23 Potential to Achieve Goals: Good ADL Goals Pt Will Perform Grooming: sitting;with supervision;with set-up Pt Will Perform Lower Body Dressing: with mod assist;sit to/from stand Pt Will Transfer to Toilet: with mod assist;bedside commode;ambulating (LRAD)  OT Frequency: Min 1X/week  Co-evaluation PT/OT/SLP Co-Evaluation/Treatment: Yes Reason for Co-Treatment: For patient/therapist safety;To address functional/ADL transfers PT  goals addressed during session: Mobility/safety with mobility;Balance;Proper use of DME OT goals addressed during session: ADL's and self-care      AM-PAC OT "6 Clicks" Daily Activity     Outcome Measure Help from another person eating meals?: None Help from another person taking care of personal grooming?: A Little Help from another person toileting, which includes using toliet, bedpan, or urinal?: A Lot Help from another person bathing (including washing, rinsing, drying)?: A Lot Help from another person to put on and taking off regular upper body clothing?: A Little Help from another person to put on and taking off regular lower body clothing?: A Lot 6 Click Score: 16   End of Session Equipment Utilized During Treatment: Gait belt;Rolling walker (2 wheels)  Activity Tolerance: Patient tolerated treatment well Patient left: in bed;with call bell/phone within reach;with bed alarm set  OT Visit Diagnosis: Unsteadiness on feet (R26.81);Other abnormalities of gait and mobility (R26.89);Repeated falls (R29.6);Muscle weakness (generalized) (M62.81);History of falling (Z91.81)                Time: 1610-9604 OT Time Calculation (min): 30 min Charges:  OT General Charges $OT Visit: 1 Visit OT Evaluation $OT Re-eval: 1 Re-eval OT Treatments $Self Care/Home Management : 8-22 mins  Arman Filter., MPH, MS, OTR/L ascom [phone] 03/29/23, 1:52 PM

## 2023-03-29 NOTE — TOC Progression Note (Signed)
Transition of Care [facility]) - Progression Note    Patient Details  Name: Thomas [name] MRN: [id number] Date of Birth: 1930-05-28  Transition of Care [facility]) CM/SW Contact  Allena Katz, LCSW Phone Number: 03/29/2023, 3:10 PM  Clinical Narrative:   CSW sent message to tiffany at [facility] to start insurance authorization.    Expected Discharge Plan: Skilled Nursing Facility Barriers to Discharge: Continued Medical Work up  Expected Discharge Plan and Services                                               Social Determinants of Health (SDOH) Interventions SDOH Screenings   Food Insecurity: No Food Insecurity (03/12/2023)  Housing: Low Risk  (03/12/2023)  Transportation Needs: No Transportation Needs (03/12/2023)  Utilities: Not At Risk (03/12/2023)  Financial Resource Strain: Low Risk  (10/12/2022)   Received from [facility]  Tobacco Use: Low Risk  (03/19/2023)  Health Literacy: High Risk (03/12/2023)   Received from [facility]    Readmission Risk Interventions     No data to display

## 2023-03-29 NOTE — Progress Notes (Signed)
Brief Urology Progress Note  Urology was asked to see the patient again due to penile swelling.  I asked that he be premedicated with 5 mg oxycodone in advance of my arrival.  On arrival, the glans is exposed with a tight paraphimotic ring and edema of the foreskin.  I attempted initial foreskin reduction but was unsuccessful due to the degree of swelling and patient discomfort.  I wrapped his penis distally to proximally with Kerlix and Coban and applied manual pressure to reduce edema.  He then received 1 mg Dilaudid IV for additional premedication.  After 20 minutes, I returned to the bedside and removed the wrapping from his penis.  I was subsequently able to reduce the foreskin with minimal difficulty.  Patient tolerated well.  Paraphimosis resolved.  No further urologic intervention indicated.  Recommend monitoring of the foreskin to make sure it does not retract again.  Carman Ching, PA-C 03/29/23  1:15 PM

## 2023-03-29 NOTE — Progress Notes (Signed)
Progress Note    03/29/2023 11:09 AM 10 Days Post-Op  Subjective:  Thomas [name] is a 89 yo male who is now POD #10 from right carotid endarterectomy.  Patient is recovering as expected.  Patient was eating breakfast this morning in bed when I arrived to examine him.  Patient remains with erythema to the right side of his neck.  Incisions clean dry and intact no hematoma seroma to note.  Patient able to answer all of my questions appropriate this morning. Patient remains with hematuria to his foley catheter but appears to be better this morning. No complaints this morning and vitals all remained stable.    Vitals:   03/29/23 0537 03/29/23 0740  BP: 114/74 118/76  Pulse: 78 76  Resp: 18 18  Temp: 97.7 F (36.5 C) 98.1 F (36.7 C)  SpO2: 95% 94%   Physical Exam: Cardiac:  RRR, Normal S1,S2. No murmur Lungs: Normal respiratory effort, clear on auscultation throughout but diminished in the bases.  No rales rhonchi or wheezing. Incisions: Right neck incision with erythema.  No signs or symptoms of hematoma seroma or infection.  No drainage noted. Extremities: Patient is moving all extremities well.  Patient has noted to be weaker on the left side than the right side prior to admission.  This remains the same.  Palpable pulses throughout. Abdomen: Positive bowel sounds throughout, soft, nontender and nondistended. Neurologic: AAOX3 today. Answers questions and follows commands appropriately. .  CBC    Component Value Date/Time   WBC 6.8 03/27/2023 0457   RBC 3.38 (L) 03/27/2023 0457   HGB 10.2 (L) 03/27/2023 0457   HGB 10.6 (L) 10/28/2011 0622   HCT 30.8 (L) 03/27/2023 0457   HCT 40.5 10/19/2011 0819   PLT 260 03/27/2023 0457   PLT 161 10/28/2011 0622   MCV 91.1 03/27/2023 0457   MCV 91 10/19/2011 0819   MCH 30.2 03/27/2023 0457   MCHC 33.1 03/27/2023 0457   RDW 13.2 03/27/2023 0457   RDW 14.3 10/19/2011 0819   LYMPHSABS 1.1 03/15/2023 1128   MONOABS 0.2 03/15/2023 1128    EOSABS 0.3 03/15/2023 1128   BASOSABS 0.0 03/15/2023 1128    BMET    Component Value Date/Time   NA 138 03/27/2023 0457   NA 140 10/28/2011 0622   K 3.7 03/27/2023 0457   K 4.1 10/28/2011 0622   CL 103 03/27/2023 0457   CL 108 (H) 10/28/2011 0622   CO2 26 03/27/2023 0457   CO2 24 10/28/2011 0622   GLUCOSE 89 03/27/2023 0457   GLUCOSE 81 10/28/2011 0622   BUN 19 03/27/2023 0457   BUN 17 10/28/2011 0622   CREATININE 1.10 03/27/2023 0457   CREATININE 1.12 10/28/2011 0622   CALCIUM 8.6 (L) 03/27/2023 0457   CALCIUM 7.9 (L) 10/28/2011 0622   GFRNONAA >60 03/27/2023 0457   GFRNONAA >60 10/28/2011 0622   GFRAA >60 10/28/2011 0622    INR    Component Value Date/Time   INR 1.1 03/11/2023 1359   INR 0.9 10/19/2011 0819     Intake/Output Summary (Last 24 hours) at 03/29/2023 1109 Last data filed at 03/29/2023 1100 Gross per 24 hour  Intake 240 ml  Output 400 ml  Net -160 ml     Assessment/Plan:  89 y.o. male is s/p right carotid endarterectomy.  10 Days Post-Op   PLAN: Per vascular surgery patient okay to discharge when medically stable. He will need to continue aspirin 81 mg daily, Plavix 75 mg daily and Mevacor 40  mg daily once his hematuria resolves. Follow-up in vein and vascular clinic in 1 month with carotid ultrasounds.   DVT prophylaxis:  ASA 81 mg daily.    Marcie [facility] 03/29/2023 11:09 AM

## 2023-03-29 NOTE — Progress Notes (Signed)
PROGRESS NOTE    Thomas [name]  NWG:[id number] DOB: [birth year] DOA: 03/11/2023 PCP: Barbette Reichmann, MD    Assessment & Plan:   Principal Problem:   Acute CVA (cerebrovascular accident) [facility])  Assessment and Plan: Acute CVA: presented 3 weeks after previous hospitalization for stroke and discharged from [facility]. Evidence of new acute CVA on imaging. S/p right carotid endarterectomy on 11/8. Continue on aspirin, statin. Holding plavix secondary to gross hematuria  Hematuria: secondary penile trauma. Gross hematuria has improved but still present. As per nurse, pt was confused post-op and pulled out his Foley.  Has been having hematuria since then. Holding plavix. Uro following and recs apprec    Acute urinary retention: continue on flomax & foley.  F/u with [facility] for a voiding trial in a week or two   Penile pain: oxy prn. Uro will see the pt this afternoon    AKI: resolved   Normocytic anemia: H&H are labile.    HTN: metoprolol prn    HLD: continue w/ statin    GERD: continue w/ PPI    Hospital delirium: continue w/ supportive care    Bilious vomiting:  started morning of 11/10, resolved by next day.   Constipation: continue on colace, miralax and lactulose      DVT prophylaxis: SCDs Code Status: DNR  Family Communication:  Disposition Plan: d/c to SNF   Level of care: Med-Surg Status is: Inpatient Remains inpatient appropriate because: penile pain today     Consultants:  Urology  Neuro   Procedures:   Antimicrobials:   Subjective: Pt c/o malaise   Objective: Vitals:   03/28/23 0800 03/28/23 2030 03/29/23 0537 03/29/23 0740  BP: 128/82 109/70 114/74 118/76  Pulse: 61 84 78 76  Resp: 18 18 18 18   Temp: 97.6 F (36.4 C) 98 F (36.7 C) 97.7 F (36.5 C) 98.1 F (36.7 C)  TempSrc:      SpO2: 97% 96% 95% 94%  Height:        Intake/Output Summary (Last 24 hours) at 03/29/2023 0835 Last data filed at 03/28/2023 1235 Gross per 24 hour   Intake 240 ml  Output --  Net 240 ml   There were no vitals filed for this visit.  Examination:  General exam: appears uncomfortable  Respiratory system: clear breath sounds b/l  Cardiovascular system: S1/S2+. No rubs or clicks  Gastrointestinal system: abd is soft, NT, ND & hypoactive bowel sounds  Central nervous system: alert & awake Psychiatry: judgement and insight appears at baseline. Appropriate mood and affect     Data Reviewed: I have personally reviewed following labs and imaging studies  CBC: Recent Labs  Lab 03/23/23 0523 03/24/23 0433 03/25/23 0500 03/26/23 1512 03/27/23 0457  WBC 9.6 6.3 6.3 6.8 6.8  HGB 10.8* 9.8* 10.3* 11.2* 10.2*  HCT 32.8* 30.5* 30.9* 34.6* 30.8*  MCV 91.6 94.7 91.4 93.5 91.1  PLT 257 227 252 281 260   Basic Metabolic Panel: Recent Labs  Lab 03/23/23 0523 03/24/23 0433 03/25/23 0500 03/26/23 1512 03/27/23 0457  NA 137 138 140 138 138  K 3.5 3.5 3.7 3.5 3.7  CL 106 106 106 104 103  CO2 25 25 27 26 26   GLUCOSE 112* 84 95 105* 89  BUN 34* 40* 31* 17 19  CREATININE 2.24* 1.94* 1.24 0.99 1.10  CALCIUM 8.5* 8.3* 8.5* 8.7* 8.6*  MG 2.6* 2.5* 2.4 2.2 2.1   GFR: CrCl cannot be calculated (Unknown ideal weight.). Liver Function Tests: No results for  input(s): "AST", "ALT", "ALKPHOS", "BILITOT", "PROT", "ALBUMIN" in the last 168 hours. No results for input(s): "LIPASE", "AMYLASE" in the last 168 hours. No results for input(s): "AMMONIA" in the last 168 hours. Coagulation Profile: No results for input(s): "INR", "PROTIME" in the last 168 hours. Cardiac Enzymes: No results for input(s): "CKTOTAL", "CKMB", "CKMBINDEX", "TROPONINI" in the last 168 hours. BNP (last 3 results) No results for input(s): "PROBNP" in the last 8760 hours. HbA1C: No results for input(s): "HGBA1C" in the last 72 hours. CBG: No results for input(s): "GLUCAP" in the last 168 hours.  Lipid Profile: No results for input(s): "CHOL", "HDL", "LDLCALC",  "TRIG", "CHOLHDL", "LDLDIRECT" in the last 72 hours. Thyroid Function Tests: No results for input(s): "TSH", "T4TOTAL", "FREET4", "T3FREE", "THYROIDAB" in the last 72 hours. Anemia Panel: No results for input(s): "VITAMINB12", "FOLATE", "FERRITIN", "TIBC", "IRON", "RETICCTPCT" in the last 72 hours. Sepsis Labs: No results for input(s): "PROCALCITON", "LATICACIDVEN" in the last 168 hours.  Recent Results (from the past 240 hour(s))  MRSA Next Gen by PCR, Nasal     Status: None   Collection Time: 03/19/23  4:27 PM   Specimen: Nasal Mucosa; Nasal Swab  Result Value Ref Range Status   MRSA by PCR Next Gen NOT DETECTED NOT DETECTED Final    Comment: (NOTE) The GeneXpert MRSA Assay (FDA approved for NASAL specimens only), is one component of a comprehensive MRSA colonization surveillance program. It is not intended to diagnose MRSA infection nor to guide or monitor treatment for MRSA infections. Test performance is not FDA approved in patients less than 45 years old. Performed at [facility] [place], [address], [place], [place] [address]          Radiology Studies: No results found.      Scheduled Meds:  aspirin EC  81 mg Oral Daily   atorvastatin  40 mg Oral Daily   Chlorhexidine Gluconate Cloth  6 each Topical Daily   docusate sodium  100 mg Oral Daily   lactulose  20 g Oral BID   pantoprazole  40 mg Oral QHS   polyethylene glycol  17 g Oral Daily   sertraline  25 mg Oral Daily   sodium chloride flush  3 mL Intravenous Q12H   tamsulosin  0.4 mg Oral Daily   Continuous Infusions:   LOS: 13 days      Charise Killian, MD [facility] Pager [phone]  If 7PM-7AM, please contact night-coverage [email or web address]  03/29/2023, 8:35 AM

## 2023-03-29 NOTE — Telephone Encounter (Signed)
Pt is currently in-pt, tried to contact pt, phone has been disconnected.  I sent pt a MyChart message.

## 2023-03-29 NOTE — Progress Notes (Signed)
Patient noted to have swelling around the base of the glans.  PA notified via epic chat, PA states to medicate patient prior to arrival at approximately 11:40 am, no further orders.

## 2023-03-29 NOTE — Evaluation (Signed)
Physical Therapy Re-Evaluation Patient Details Name: Thomas [name] MRN: [id number] DOB: [birth year] Today's Date: 03/29/2023  History of Present Illness  Pt is a 89 y.o. male presenting to hospital 03/11/23 with generalized weakness, dizziness, and being off balance; came home from [facility] rehab 7 days prior after multifocal small vessel infarcts.  Imaging showing acute infarct of R anterior thalamus.  Pt admitted with acute CVA.  S/p 03/19/23 R CEA (PT/OT orders discontinued 03/19/23). Pt also noted with hematuria and acute urinary retention.  New PT/OT orders received 03/29/23.  PMH includes recent stroke 3 weeks ago, htn, HLD.  Clinical Impression  PT/OT co-re-evaluation performed.  Pt sitting on BSC upon therapy arrival.  L sided weakness noted (pt with h/o stroke).  Prior to recent hospital admission, pt reports being ambulatory with walker.  Pt oriented to person, hospital, general situation and month but generalized confusion noted (pt also reported it was 1948 and that he lives with his mother and sister).  Pt requiring increased time for following cues.  Currently pt is mod to max assist x2 to stand from [facility]; min to mod assist x2 to take a few steps backwards towards bed with RW use; and max assist x2 to lay back down in bed end of session.  Pt would currently benefit from skilled PT to address noted impairments and functional limitations (see below for any additional details).  Upon hospital discharge, pt would benefit from ongoing therapy.     If plan is discharge home, recommend the following: Two people to help with walking and/or transfers;Two people to help with bathing/dressing/bathroom;Assistance with cooking/housework;Assistance with feeding;Direct supervision/assist for medications management;Assist for transportation;Supervision due to cognitive status;Help with stairs or ramp for entrance   Can travel by private vehicle   No    Equipment Recommendations Other (comment) (TBD at next  facility)  Recommendations for Other Services       Functional Status Assessment Patient has had a recent decline in their functional status and demonstrates the ability to make significant improvements in function in a reasonable and predictable amount of time.     Precautions / Restrictions Precautions Precautions: Fall Restrictions Weight Bearing Restrictions: No      Mobility  Bed Mobility Overal bed mobility: Needs Assistance Bed Mobility: Sit to Supine       Sit to supine: Max assist, +2 for physical assistance   General bed mobility comments: assist for trunk and B LE's; vc's for technique    Transfers Overall transfer level: Needs assistance Equipment used: Rolling walker (2 wheels) Transfers: Sit to/from Stand Sit to Stand: Mod assist, Max assist, +2 physical assistance           General transfer comment: assist to initiate and come to full stand from [facility]; vc's for UE/LE placement; assist to control descent sitting    Ambulation/Gait Ambulation/Gait assistance: Min assist, Mod assist, +2 physical assistance Gait Distance (Feet):  (a few steps backwards towards bed) Assistive device: Rolling walker (2 wheels) Gait Pattern/deviations: Decreased step length - right, Decreased step length - left, Decreased stance time - left Gait velocity: decreased     General Gait Details: mild L lean noted; assist to steady; increased effort/time to take steps  Stairs            Wheelchair Mobility     Tilt Bed    Modified Rankin (Stroke Patients Only)       Balance Overall balance assessment: Needs assistance Sitting-balance support: Bilateral upper extremity supported, Feet supported Sitting  balance-[name]: Fair Sitting balance - Comments: steady static sitting; mild L lean noted Postural control: Left lateral lean Standing balance support: Bilateral upper extremity supported, Reliant on assistive device for balance, During functional activity    Standing balance comment: mild L lean noted in standing with heavy UE support through RW                             Pertinent Vitals/Pain Pain Assessment Pain Assessment: No/denies pain Pain Intervention(s): Limited activity within patient's tolerance, Monitored during session, Repositioned VSS during session (HR and SpO2 on room air).    Home Living Family/patient expects to be discharged to:: Private residence Living Arrangements: Alone Available Help at Discharge: Family Type of Home: House Home Access: Stairs to enter   Secretary/administrator of Steps: 2   Home Layout: One level Home Equipment: Pharmacist, hospital (2 wheels) Additional Comments:  (Pt reports living with his mother and sister in 1 level home with 1 STE R railing; h/o falls.)    Prior Function Prior Level of Function : Patient poor historian/Family not available             Mobility Comments: Ambulates with RW. (pt reports ambulatory with walker; h/o falls) ADLs Comments: Per chart review: Able to preform basic ADL's     Extremity/Trunk Assessment   Upper Extremity Assessment Upper Extremity Assessment: Defer to OT evaluation    Lower Extremity Assessment Lower Extremity Assessment: RLE deficits/detail;LLE deficits/detail RLE Deficits / Details: hip flexion 4+/5; knee flexion/extension 4+/5; DF 4/5 LLE Deficits / Details: hip flexion 4-/5; knee flexion/extension 4-/5; DF 4/5       Communication   Communication Communication: Difficulty following commands/understanding Following commands: Follows one step commands consistently Cueing Techniques: Verbal cues;Visual cues  Cognition Arousal: Alert Behavior During Therapy: Lability (Occasionally teary regarding status (therapeutic listening and support provided)) Overall Cognitive Status: No family/caregiver present to determine baseline cognitive functioning Area of Impairment: Following commands, Safety/judgement,  Orientation, Problem solving                 Orientation Level: Disoriented to, Time (pt reported it was beginning of November 1948)   Memory: Decreased short-term memory Following Commands: Follows one step commands with increased time Safety/Judgement: Decreased awareness of deficits   Problem Solving: Slow processing, Requires verbal cues, Requires tactile cues, Decreased initiation, Difficulty sequencing          General Comments  Nursing cleared pt for participation in physical therapy.  Pt agreeable to PT/OT session.    Exercises     Assessment/Plan    PT Assessment Patient needs continued PT services  PT Problem List Decreased strength;Decreased mobility;Decreased safety awareness;Decreased range of motion;Decreased coordination;Decreased activity tolerance;Decreased cognition;Decreased balance;Decreased knowledge of use of DME;Decreased knowledge of precautions;Decreased skin integrity       PT Treatment Interventions DME instruction;Therapeutic exercise;Gait training;Balance training;Stair training;Neuromuscular re-education;Functional mobility training;Cognitive remediation;Therapeutic activities;Patient/family education    PT Goals (Current goals can be found in the Care Plan section)  Acute Rehab PT Goals Patient Stated Goal: to improve strength and mobility PT Goal Formulation: With patient Time For Goal Achievement: 04/12/23 Potential to Achieve Goals: Good    Frequency Min 1X/week     Co-evaluation PT/OT/SLP Co-Evaluation/Treatment: Yes Reason for Co-Treatment: For patient/therapist safety;To address functional/ADL transfers PT goals addressed during session: Mobility/safety with mobility;Balance;Proper use of DME OT goals addressed during session: ADL's and self-care       AM-PAC PT "6  Clicks" Mobility  Outcome Measure Help needed turning from your back to your side while in a flat bed without using bedrails?: A Lot Help needed moving from  lying on your back to sitting on the side of a flat bed without using bedrails?: Total Help needed moving to and from a bed to a chair (including a wheelchair)?: Total Help needed standing up from a chair using your arms (e.g., wheelchair or bedside chair)?: Total Help needed to walk in hospital room?: Total Help needed climbing 3-5 steps with a railing? : Total 6 Click Score: 7    End of Session Equipment Utilized During Treatment: Gait belt Activity Tolerance: Patient limited by fatigue Patient left: in bed;with call bell/phone within reach;with bed alarm set;Other (comment) (B heels floating via pillow support) Nurse Communication: Mobility status;Precautions PT Visit Diagnosis: Unsteadiness on feet (R26.81);Other abnormalities of gait and mobility (R26.89);Repeated falls (R29.6);Muscle weakness (generalized) (M62.81);Hemiplegia and hemiparesis Hemiplegia - Right/Left: Left Hemiplegia - caused by: Cerebral infarction    Time: 1100-1137 PT Time Calculation (min) (ACUTE ONLY): 37 min   Charges:   PT Evaluation $PT Re-evaluation: 1 Re-eval PT Treatments $Therapeutic Activity: 8-22 mins PT General Charges $$ ACUTE PT VISIT: 1 Visit        Hendricks Limes, PT 03/29/23, 2:59 PM

## 2023-03-30 DIAGNOSIS — R310 Gross hematuria: Secondary | ICD-10-CM | POA: Diagnosis not present

## 2023-03-30 DIAGNOSIS — I639 Cerebral infarction, unspecified: Secondary | ICD-10-CM | POA: Diagnosis not present

## 2023-03-30 NOTE — Plan of Care (Signed)
  Problem: Education: Goal: Knowledge of disease or condition will improve Outcome: Progressing Goal: Knowledge of secondary prevention will improve (MUST DOCUMENT ALL) Outcome: Progressing Goal: Knowledge of patient specific risk factors will improve Loraine Leriche N/A or DELETE if not current risk factor) Outcome: Progressing   Problem: Ischemic Stroke/TIA Tissue Perfusion: Goal: Complications of ischemic stroke/TIA will be minimized Outcome: Progressing   Problem: Coping: Goal: Will verbalize positive feelings about self Outcome: Progressing Goal: Will identify appropriate support needs Outcome: Progressing   Problem: Health Behavior/Discharge Planning: Goal: Ability to manage health-related needs will improve Outcome: Progressing Goal: Goals will be collaboratively established with patient/family Outcome: Progressing   Problem: Self-Care: Goal: Ability to participate in self-care as condition permits will improve Outcome: Progressing Goal: Verbalization of feelings and concerns over difficulty with self-care will improve Outcome: Progressing Goal: Ability to communicate needs accurately will improve Outcome: Progressing   Problem: Nutrition: Goal: Risk of aspiration will decrease Outcome: Progressing Goal: Dietary intake will improve Outcome: Progressing   Problem: Education: Goal: Knowledge of General Education information will improve Description: Including pain rating scale, medication(s)/side effects and non-pharmacologic comfort measures Outcome: Progressing   Problem: Health Behavior/Discharge Planning: Goal: Ability to manage health-related needs will improve Outcome: Progressing   Problem: Clinical Measurements: Goal: Ability to maintain clinical measurements within normal limits will improve Outcome: Progressing Goal: Will remain free from infection Outcome: Progressing Goal: Diagnostic test results will improve Outcome: Progressing Goal: Respiratory  complications will improve Outcome: Progressing Goal: Cardiovascular complication will be avoided Outcome: Progressing   Problem: Activity: Goal: Risk for activity intolerance will decrease Outcome: Progressing   Problem: Nutrition: Goal: Adequate nutrition will be maintained Outcome: Progressing   Problem: Coping: Goal: Level of anxiety will decrease Outcome: Progressing   Problem: Elimination: Goal: Will not experience complications related to bowel motility Outcome: Progressing Goal: Will not experience complications related to urinary retention Outcome: Progressing   Problem: Pain Management: Goal: General experience of comfort will improve Outcome: Progressing   Problem: Safety: Goal: Ability to remain free from injury will improve Outcome: Progressing   Problem: Skin Integrity: Goal: Risk for impaired skin integrity will decrease Outcome: Progressing   Problem: Education: Goal: Knowledge of discharge needs will improve Outcome: Progressing   Problem: Clinical Measurements: Goal: Postoperative complications will be avoided or minimized Outcome: Progressing   Problem: Respiratory: Goal: Ability to achieve and maintain a regular respiratory rate will improve Outcome: Progressing   Problem: Skin Integrity: Goal: Demonstration of wound healing without infection will improve Outcome: Progressing

## 2023-03-30 NOTE — Progress Notes (Signed)
Physical Therapy Treatment Patient Details Name: Thomas [name] MRN: [id number] DOB: 09/12/1930 Today's Date: 03/30/2023   History of Present Illness Pt is a 89 y.o. male presenting to hospital 03/11/23 with generalized weakness, dizziness, and being off balance; came home from [facility] rehab 7 days prior after multifocal small vessel infarcts.  Imaging showing acute infarct of R anterior thalamus.  Pt admitted with acute CVA.  S/p 03/19/23 R CEA (PT/OT orders discontinued 03/19/23). Pt also noted with hematuria and acute urinary retention.  New PT/OT orders received 03/29/23.  PMH includes recent stroke 3 weeks ago, htn, HLD.    PT Comments  Pt sitting in recliner upon PT arrival; agreeable to therapy; no c/o pain during session.  Pt able to stand with mod assist x2 and then ambulated 4 feet (with RW use) with min to mod assist x2 (L lean d/t weakness noted with difficulty advancing R LE compared to L LE; see below for further details).  Attempted to initiate LE ex's sitting in recliner but pt reporting needing to have BM.  Pt transferred to [facility] with mod to max assist x2 (squat pivot) and pt left on BSC with NT present.  Will continue to focus on strengthening, balance, and progressive functional mobility during hospitalization.   If plan is discharge home, recommend the following: Two people to help with walking and/or transfers;Two people to help with bathing/dressing/bathroom;Assistance with cooking/housework;Assistance with feeding;Direct supervision/assist for medications management;Assist for transportation;Supervision due to cognitive status;Help with stairs or ramp for entrance   Can travel by private vehicle     No  Equipment Recommendations  Other (comment) (TBD at next facility)    Recommendations for Other Services       Precautions / Restrictions Precautions Precautions: Fall Restrictions Weight Bearing Restrictions: No     Mobility  Bed Mobility               General bed  mobility comments: Deferred (pt sitting in recliner beginning of session; pt sitting on BSC with NT present end of session)    Transfers Overall transfer level: Needs assistance Equipment used: Rolling walker (2 wheels), None Transfers: Sit to/from Stand, Bed to chair/wheelchair/BSC Sit to Stand: Mod assist, +2 physical assistance     Squat pivot transfers: Mod assist, Max assist, +2 physical assistance     General transfer comment: mod assist x2 to stand from recliner (vc's for UE/LE positioning and overall technique; assist to initiate stand, to stand upright, and control descent sitting); mod to max assist x2 squat pivot transfer to R recliner to [facility]    Ambulation/Gait Ambulation/Gait assistance: Min assist, Mod assist, +2 physical assistance Gait Distance (Feet): 4 Feet Assistive device: Rolling walker (2 wheels)   Gait velocity: decreased     General Gait Details: L lean noted with decreased L LE stance time; able to take a small step forward with L LE but increased difficulty taking step/clearing floor with R LE; vc's for upright posture/midline positioning   Stairs             Wheelchair Mobility     Tilt Bed    Modified Rankin (Stroke Patients Only)       Balance Overall balance assessment: Needs assistance Sitting-balance support: Bilateral upper extremity supported, Feet supported Sitting balance-[name]: Fair Sitting balance - Comments: steady static sitting; mild L lean noted Postural control: Left lateral lean Standing balance support: Bilateral upper extremity supported, Reliant on assistive device for balance, During functional activity Standing balance-[name]: Poor  Standing balance comment: mild (to moderate with fatigue) L lean noted in standing with heavy UE support through RW                            Cognition Arousal: Alert Behavior During Therapy: [facility] for tasks assessed/performed Overall Cognitive Status: No  family/caregiver present to determine baseline cognitive functioning Area of Impairment: Following commands, Safety/judgement, Orientation, Problem solving                 Orientation Level: Disoriented to, Time   Memory: Decreased short-term memory Following Commands: Follows one step commands with increased time Safety/Judgement: Decreased awareness of deficits   Problem Solving: Slow processing, Requires verbal cues, Requires tactile cues, Decreased initiation, Difficulty sequencing          Exercises Total Joint Exercises Long Arc Quad: AROM, Strengthening, Left, Seated (x7 reps with vc's (limited ex's d/t pt needing to toilet))    General Comments        Pertinent Vitals/Pain Pain Assessment Pain Assessment: No/denies pain Pain Intervention(s): Limited activity within patient's tolerance, Monitored during session, Repositioned HR in 90's (bpm) and SpO2 sats 94% or greater on room air during session.    Home Living                          Prior Function            PT Goals (current goals can now be found in the care plan section) Acute Rehab PT Goals Patient Stated Goal: to improve strength and mobility PT Goal Formulation: With patient Time For Goal Achievement: 04/12/23 Potential to Achieve Goals: Good Progress towards PT goals: Progressing toward goals    Frequency    Min 1X/week      PT Plan      Co-evaluation              AM-PAC PT "6 Clicks" Mobility   Outcome Measure  Help needed turning from your back to your side while in a flat bed without using bedrails?: A Lot Help needed moving from lying on your back to sitting on the side of a flat bed without using bedrails?: Total Help needed moving to and from a bed to a chair (including a wheelchair)?: Total Help needed standing up from a chair using your arms (e.g., wheelchair or bedside chair)?: Total Help needed to walk in hospital room?: Total Help needed climbing 3-5  steps with a railing? : Total 6 Click Score: 7    End of Session Equipment Utilized During Treatment: Gait belt Activity Tolerance: Patient limited by fatigue Patient left:  (on BSC with NT present) Nurse Communication: Mobility status;Precautions PT Visit Diagnosis: Unsteadiness on feet (R26.81);Other abnormalities of gait and mobility (R26.89);Repeated falls (R29.6);Muscle weakness (generalized) (M62.81);Hemiplegia and hemiparesis Hemiplegia - Right/Left: Left Hemiplegia - caused by: Cerebral infarction     Time: 1135-1200 PT Time Calculation (min) (ACUTE ONLY): 25 min  Charges:    $Gait Training: 8-22 mins $Therapeutic Activity: 8-22 mins PT General Charges $$ ACUTE PT VISIT: 1 Visit                     Hendricks Limes, PT 03/30/23, 2:05 PM

## 2023-03-30 NOTE — Progress Notes (Signed)
PROGRESS NOTE   HPI was taken from Dr. Georgeann Oppenheim:  Thomas [name] is a 89 y.o. male with medical history significant of hypertension, hyperlipidemia, GERD, recent CVA 3 weeks ago treated at [facility] was brought to the ED due to generalized weakness and dizziness being off balance x 1 day.  Patient came home from [facility] rehab 7 days ago after multifocal small vessel infarcts.  Was doing well at home.  Ambulating on his own with good functional status, able to perform basic ADLs.   1 night before presentation to [facility] family states the patient did not want to eat dinner and started complaining of dizziness.  The morning of presentation patient was having trouble with coordination unable to raise himself out of bed.  No evidence of trauma.   On presentation to the ED the patient is hemodynamically stable.  MRI reveals new acute CVA.  On my interview the patient is resting comfortably in bed.  Son-in-law at bedside.  Primary caregiver.  States that the patient was discharged on dual antiplatelet therapy aspirin and Plavix and has been compliant with all medications.     ED Course: CT and MRI findings as above.  Patient was started on intravenous fluids.  Hospitalist contacted for admission.  As per Dr. Mayford Knife 11/13-11/19/24: Pt has had hematuria all this week and has resolved. Pt is medically stable and ready for d/c to SNF. Waiting on insurance auth currently.     STEFFIN SMEDLEY  QIO:[id number] DOB: 1930/10/06 DOA: 03/11/2023 PCP: Barbette Reichmann, MD    Assessment & Plan:   Principal Problem:   Acute CVA (cerebrovascular accident) [facility])  Assessment and Plan: Acute CVA: presented 3 weeks after previous hospitalization for stroke and discharged from [facility]. Evidence of new acute CVA on imaging. S/p right carotid endarterectomy on 11/8. Continue on aspirin, statin. Can likely restart plavix back in 24-48 hrs as it was being held for gross hematuria   Hematuria: secondary penile trauma. As per nurse, pt was  confused post-op and pulled out his Foley.  Has been having hematuria since then. Can likely restart plavix back in 24-48 hrs. Uro following and recs apprec    Acute urinary retention: continue w/ foley and flomax.  F/u with [facility] for a voiding trial in a week or two   Penile pain: resolved.    AKI: resolved   Normocytic anemia: H&H are labile. Will transfuse if Hb < 7.0    HTN: metoprolol prn    HLD: continue w/ statin    GERD: continue w/ PPI    Hospital delirium: continue w/ supportive care    Bilious vomiting:  started morning of 11/10, resolved by next day.   Constipation: resolved       DVT prophylaxis: SCDs Code Status: DNR  Family Communication:  Disposition Plan: d/c to SNF. Waiting on insurance auth   Level of care: Med-Surg Status is: Inpatient Remains inpatient appropriate because: waiting on insurance auth    Consultants:  Urology  Neuro   Procedures:   Antimicrobials:   Subjective: Pt c/o fatigue   Objective: Vitals:   03/29/23 1532 03/29/23 2018 03/30/23 0415 03/30/23 0745  BP: 97/63 98/67 (!) 103/53 107/61  Pulse: 64 72 63 60  Resp: 17 18 18 19   Temp: 97.9 F (36.6 C) 97.7 F (36.5 C) 97.9 F (36.6 C) 98.1 F (36.7 C)  TempSrc:  Oral    SpO2: 94% 96% 95% 96%  Height:        Intake/Output Summary (  Last 24 hours) at 03/30/2023 1610 Last data filed at 03/30/2023 0445 Gross per 24 hour  Intake --  Output 2100 ml  Net -2100 ml   There were no vitals filed for this visit.  Examination:  General exam: appears calm & comfortable  Respiratory system: clear breath sounds b/l Cardiovascular system: S1/S2+. No rubs or clicks  Gastrointestinal system: abd is soft, NT, ND & hypoactive bowel sounds  Central nervous system: alert & awake Psychiatry: judgement and insight appears at baseline. Appropriate mood and affect    Data Reviewed: I have personally reviewed following labs and imaging studies  CBC: Recent Labs   Lab 03/24/23 0433 03/25/23 0500 03/26/23 1512 03/27/23 0457  WBC 6.3 6.3 6.8 6.8  HGB 9.8* 10.3* 11.2* 10.2*  HCT 30.5* 30.9* 34.6* 30.8*  MCV 94.7 91.4 93.5 91.1  PLT 227 252 281 260   Basic Metabolic Panel: Recent Labs  Lab 03/24/23 0433 03/25/23 0500 03/26/23 1512 03/27/23 0457  NA 138 140 138 138  K 3.5 3.7 3.5 3.7  CL 106 106 104 103  CO2 25 27 26 26   GLUCOSE 84 95 105* 89  BUN 40* 31* 17 19  CREATININE 1.94* 1.24 0.99 1.10  CALCIUM 8.3* 8.5* 8.7* 8.6*  MG 2.5* 2.4 2.2 2.1   GFR: CrCl cannot be calculated (Unknown ideal weight.). Liver Function Tests: No results for input(s): "AST", "ALT", "ALKPHOS", "BILITOT", "PROT", "ALBUMIN" in the last 168 hours. No results for input(s): "LIPASE", "AMYLASE" in the last 168 hours. No results for input(s): "AMMONIA" in the last 168 hours. Coagulation Profile: No results for input(s): "INR", "PROTIME" in the last 168 hours. Cardiac Enzymes: No results for input(s): "CKTOTAL", "CKMB", "CKMBINDEX", "TROPONINI" in the last 168 hours. BNP (last 3 results) No results for input(s): "PROBNP" in the last 8760 hours. HbA1C: No results for input(s): "HGBA1C" in the last 72 hours. CBG: No results for input(s): "GLUCAP" in the last 168 hours.  Lipid Profile: No results for input(s): "CHOL", "HDL", "LDLCALC", "TRIG", "CHOLHDL", "LDLDIRECT" in the last 72 hours. Thyroid Function Tests: No results for input(s): "TSH", "T4TOTAL", "FREET4", "T3FREE", "THYROIDAB" in the last 72 hours. Anemia Panel: No results for input(s): "VITAMINB12", "FOLATE", "FERRITIN", "TIBC", "IRON", "RETICCTPCT" in the last 72 hours. Sepsis Labs: No results for input(s): "PROCALCITON", "LATICACIDVEN" in the last 168 hours.  No results found for this or any previous visit (from the past 240 hour(s)).        Radiology Studies: No results found.      Scheduled Meds:  aspirin EC  81 mg Oral Daily   atorvastatin  40 mg Oral Daily   Chlorhexidine  Gluconate Cloth  6 each Topical Daily   docusate sodium  100 mg Oral Daily   lactulose  20 g Oral BID   pantoprazole  40 mg Oral QHS   polyethylene glycol  17 g Oral Daily   sertraline  25 mg Oral Daily   sodium chloride flush  3 mL Intravenous Q12H   tamsulosin  0.4 mg Oral Daily   Continuous Infusions:   LOS: 14 days      Charise Killian, MD [facility] Pager [phone]  If 7PM-7AM, please contact night-coverage [email or web address]  03/30/2023, 8:07 AM

## 2023-03-31 DIAGNOSIS — I639 Cerebral infarction, unspecified: Secondary | ICD-10-CM | POA: Diagnosis not present

## 2023-03-31 NOTE — Progress Notes (Signed)
PROGRESS NOTE    Thomas [name]  NWG:[id number] DOB: 1930-08-04 DOA: 03/11/2023 PCP: Barbette Reichmann, MD  119A/119A-AA  LOS: 15 days   Brief hospital course:  Thomas [name] is a 89 y.o. male with medical history significant of hypertension, hyperlipidemia, recent CVA 3 weeks ago treated at [facility] was brought to the ED due to generalized weakness and dizziness being off balance x 1 day.    Patient came home from [facility] rehab 7 days PTA after multifocal small vessel infarcts.  Was doing well at home.  Ambulating on his own with good functional status, able to perform basic ADLs.  1 night before presentation to [facility] family states the patient did not want to eat dinner and started complaining of dizziness.  The morning of presentation patient was having trouble with coordination unable to raise himself out of bed.     On presentation to the ED MRI reveals new acute CVA.    Patient seen in consultation by neurology who ordered CTA to evaluate carotid anatomy. 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  Vascular surgery consulted and performed right carotid endarterectomy on 03/19/23.  Post-op, pt was more confused and pulled out his Foley causing penile trauma hematuria with subsequent acute urinary retention.  Assessment & Plan:   Acute CVA Right carotid stenosis S/p right carotid endarterectomy on 11/8 Patient presents 3 weeks after previous hospitalization for stroke and discharged from [facility].  Now with evidence of new acute CVA on imaging.  Pt was compliance with dual antiplatelet therapy and antihyperlipidemic therapy at home. --LDL 87.  switched home statin to lipitor 40 Plan: --cont ASA  --hold plavix due to gross hematuria, will resume after hematuria resolves --cont Lipitor 40   Hematuria 2/2 penile trauma --per nursing, pt was confused post-op and pulled out his Foley.   --urology consulted --Hematuria  clearing up after holding plavix  Acute urinary retention --PVR 699 on 11/11 --urology consulted, Foley inserted --cont Flomax (new) --f/u with [facility] for a voiding trial in a week or two   AKI --Cr up to 2.24 this morning.  Baseline around 1. --improved with IVF --oral hydration now  Essential hypertension --BP has been low during hospitalization.   Hyperlipidemia --LDL 87.  switched home statin to lipitor 40 --cont statin   GERD --cont PPI  Hospital delirium --more confused since the surgery. --supportive care  Bilious vomiting --started morning of 11/10, resolved by next day --KUB showed mod stool burden in rectum --Ensure BM's.   DVT prophylaxis: SCD/Compression stockings Code Status: DNR  Family Communication: son-in-law updated at bedside today Level of care: Med-Surg Dispo:   The patient is from: home Anticipated d/c is to: SNF rehab Anticipated d/c date is: whenever bed available   Subjective and Interval History:  Good oral intake.   Objective: Vitals:   03/31/23 0452 03/31/23 0714 03/31/23 1522 03/31/23 1706  BP: 115/69 135/74 126/77 130/66  Pulse: 65 (!) 58 70 74  Resp: 18 16 19 15   Temp: (!) 97.4 F (36.3 C) 97.9 F (36.6 C) 98.1 F (36.7 C)   TempSrc:      SpO2: 95% 95% 98% 95%  Height:        Intake/Output Summary (Last 24 hours) at 03/31/2023 1931 Last data filed at 03/31/2023 1837 Gross per 24 hour  Intake 3 ml  Output 900 ml  Net -897 ml   There were no vitals filed for  this visit.  Examination:   Constitutional: NAD, AAOx3 HEENT: conjunctivae and lids normal, EOMI CV: No cyanosis.   RESP: normal respiratory effort, on RA Extremities: No effusions, edema in BLE SKIN: warm, dry Neuro: II - XII grossly intact.   Psych: Normal mood and affect.   Foley present with some dark sediment, no gross hematuria   Data Reviewed: I have personally reviewed labs and imaging studies  Time spent: 50 minutes  [name],  MD [facility] If 7PM-7AM, please contact night-coverage 03/31/2023, 7:31 PM

## 2023-03-31 NOTE — Progress Notes (Signed)
Occupational Therapy Treatment Patient Details Name: Thomas [name] MRN: [id number] DOB: 19-Oct-1930 Today's Date: 03/31/2023   History of present illness Pt is a 89 y.o. male presenting to hospital 03/11/23 with generalized weakness, dizziness, and being off balance; came home from [facility] rehab 7 days prior after multifocal small vessel infarcts.  Imaging showing acute infarct of R anterior thalamus.  Pt admitted with acute CVA.  S/p 03/19/23 R CEA (PT/OT orders discontinued 03/19/23). Pt also noted with hematuria and acute urinary retention.  New PT/OT orders received 03/29/23.  PMH includes recent stroke 3 weeks ago, htn, HLD.   OT comments  Chart reviewed prior to tx session. Pt seen for OT treatment on this date. Upon arrival to room pt awake in recliner. Tx session targeted improved ADL activity tolerance, standing/sitting balance and functional mobility. Pt was alert and oriented to self, situation and place only. Pt continues to require verbal/tactile cues for sequencing during tasks, following one step directions with increased time. Pt requires MAXA for STS from recliner on this date, completed 4 STS trials with RW + intermit MAXA-MODA for lift off and static standing stability. Pt required short 30 second rest breaks in between each STS. Pt is heavy reliant on external supports to remain standing. Pt endorsees fear of falling and noted non conversational vocalizations post mobility. Pt seemly anxious post mobility, comforted with verbal encouragement. Pt required MAXA for combing hair and MIN verbal cues for task completion when face washing with supervision. Vss. Pt making progress toward goals, will continue to follow POC. Discharge recommendation remains appropriate. OT will follow acutely.           If plan is discharge home, recommend the following:  A lot of help with walking and/or transfers;A lot of help with bathing/dressing/bathroom;Supervision due to cognitive status;Direct  supervision/assist for financial management;Assistance with cooking/housework;Assist for transportation;Help with stairs or ramp for entrance;Direct supervision/assist for medications management   Equipment Recommendations  Other (comment)    Recommendations for Other Services      Precautions / Restrictions Precautions Precautions: Fall Restrictions Weight Bearing Restrictions: No       Mobility Bed Mobility               General bed mobility comments: NT pt in recliner pre/post session    Transfers Overall transfer level: Needs assistance Equipment used: Rolling walker (2 wheels) Transfers: Sit to/from Stand Sit to Stand: Max assist           General transfer comment: Intermittent MAX-MOD physical assistance from STS from recliner, 4 trials with use of RW     Balance Overall balance assessment: Needs assistance Sitting-balance support: Bilateral upper extremity supported, Feet supported Sitting balance-[name]: Fair Sitting balance - Comments: steady static sitting   Standing balance support: Bilateral upper extremity supported, Reliant on assistive device for balance, During functional activity Standing balance-[name]: Poor Standing balance comment: Slight L lean noted in standing                           ADL either performed or assessed with clinical judgement   ADL Overall ADL's : Needs assistance/impaired     Grooming: Wash/dry face;Brushing hair;Sitting Grooming Details (indicate cue type and reason): Sitting in recliner; Wash face - MINA verbal cues for completion, combing hair - MAXA         Upper Body Dressing : Cueing for sequencing;Moderate assistance;Sitting Upper Body Dressing Details (indicate cue type and reason):  Sitting in recliner - donn/doff gown                        Extremity/Trunk Assessment              Vision       Perception     Praxis      Cognition Arousal: Alert Behavior  During Therapy: WFL for tasks assessed/performed Overall Cognitive Status: Impaired/Different from baseline Area of Impairment: Following commands, Safety/judgement, Orientation, Problem solving                 Orientation Level: Disoriented to, Time Current Attention Level: Sustained Memory: Decreased short-term memory Following Commands: Follows one step commands with increased time Safety/Judgement: Decreased awareness of deficits Awareness: Emergent Problem Solving: Slow processing, Requires verbal cues, Requires tactile cues, Decreased initiation, Difficulty sequencing          Exercises Other Exercises Other Exercises: Edu: hand placement and sequencing throughout t/f, completed 4 STS trails from recliner with RW    Shoulder Instructions       General Comments      Pertinent Vitals/ Pain       Pain Assessment Pain Assessment: No/denies pain  Home Living                                          Prior Functioning/Environment              Frequency  Min 1X/week        Progress Toward Goals  OT Goals(current goals can now be found in the care plan section)  Progress towards OT goals: Progressing toward goals  Acute Rehab OT Goals Patient Stated Goal: get better OT Goal Formulation: With patient Time For Goal Achievement: 04/12/23 Potential to Achieve Goals: Good  Plan      Co-evaluation                 AM-PAC OT "6 Clicks" Daily Activity     Outcome Measure   Help from another person eating meals?: None Help from another person taking care of personal grooming?: A Little Help from another person toileting, which includes using toliet, bedpan, or urinal?: A Lot Help from another person bathing (including washing, rinsing, drying)?: A Lot Help from another person to put on and taking off regular upper body clothing?: A Little Help from another person to put on and taking off regular lower body clothing?: A Lot 6  Click Score: 16    End of Session Equipment Utilized During Treatment: Gait belt;Rolling walker (2 wheels)  OT Visit Diagnosis: Unsteadiness on feet (R26.81);Other abnormalities of gait and mobility (R26.89);Repeated falls (R29.6);Muscle weakness (generalized) (M62.81);History of falling (Z91.81)   Activity Tolerance Patient tolerated treatment well   Patient Left with call bell/phone within reach;in chair;with chair alarm set   Nurse Communication Mobility status        Time: 6295-2841 OT Time Calculation (min): 24 min  Charges: OT General Charges $OT Visit: 1 Visit OT Treatments $Self Care/Home Management : 8-22 mins $Therapeutic Activity: 8-22 mins  [employer], OTS

## 2023-04-01 DIAGNOSIS — I639 Cerebral infarction, unspecified: Secondary | ICD-10-CM | POA: Diagnosis not present

## 2023-04-01 NOTE — Plan of Care (Signed)
  Problem: Education: Goal: Knowledge of disease or condition will improve Outcome: Progressing   Problem: Self-Care: Goal: Ability to participate in self-care as condition permits will improve Outcome: Progressing   Problem: Activity: Goal: Risk for activity intolerance will decrease Outcome: Progressing   Problem: Skin Integrity: Goal: Risk for impaired skin integrity will decrease Outcome: Progressing

## 2023-04-01 NOTE — Care Management Important Message (Signed)
Important Message  Patient Details  Name: Thomas [name] MRN: [id number] Date of Birth: 1930-08-05   Important Message Given:  Yes - Medicare IM     Olegario Messier A Dorsey Authement 04/01/2023, 11:54 AM

## 2023-04-01 NOTE — Progress Notes (Signed)
PROGRESS NOTE    ILIYAN [name]  OZH:[id number] DOB: 1931/04/10 DOA: 03/11/2023 PCP: Barbette Reichmann, MD  119A/119A-AA  LOS: 16 days   Brief hospital course:  Thomas [name] is a 89 y.o. male with medical history significant of hypertension, hyperlipidemia, recent CVA 3 weeks ago treated at [facility] was brought to the ED due to generalized weakness and dizziness being off balance x 1 day.    Patient came home from [facility] rehab 7 days PTA after multifocal small vessel infarcts.  Was doing well at home.  Ambulating on his own with good functional status, able to perform basic ADLs.  1 night before presentation to [facility] family states the patient did not want to eat dinner and started complaining of dizziness.  The morning of presentation patient was having trouble with coordination unable to raise himself out of bed.     On presentation to the ED MRI reveals new acute CVA.    Patient seen in consultation by neurology who ordered CTA to evaluate carotid anatomy. 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  Vascular surgery consulted and performed right carotid endarterectomy on 03/19/23.  Post-op, pt was more confused and pulled out his Foley causing penile trauma hematuria with subsequent acute urinary retention.  Assessment & Plan:   Acute CVA Right carotid stenosis S/p right carotid endarterectomy on 11/8 Patient presents 3 weeks after previous hospitalization for stroke and discharged from [facility].  Now with evidence of new acute CVA on imaging.  Pt was compliance with dual antiplatelet therapy and antihyperlipidemic therapy at home. --LDL 87.  switched home statin to lipitor 40 Plan: --cont ASA --hold plavix due to gross hematuria, will resume after hematuria resolves --cont Lipitor 40   Hematuria 2/2 penile trauma --per nursing, pt was confused post-op and pulled out his Foley.   --urology consulted --Hematuria  clearing up after holding plavix --hold plavix for now  Acute urinary retention --PVR 699 on 11/11 --urology consulted, Foley inserted --cont Flomax (new) --f/u with [facility] for a voiding trial in a week or two   AKI --Cr up to 2.24 this morning.  Baseline around 1. --improved with IVF --oral hydration now  Essential hypertension --BP has been low during hospitalization.   Hyperlipidemia --LDL 87.  switched home statin to lipitor 40 --cont statin   GERD --cont PPI  Hospital delirium --more confused since the surgery. --supportive care  Bilious vomiting --started morning of 11/10, resolved by next day --KUB showed mod stool burden in rectum --Ensure BM's.   DVT prophylaxis: SCD/Compression stockings Code Status: DNR  Family Communication:  Level of care: Med-Surg Dispo:   The patient is from: home Anticipated d/c is to: SNF rehab Anticipated d/c date is: whenever bed available   Subjective and Interval History:  Pt was more sleepy during rounds today, and said he wanted to take a nap.   Objective: Vitals:   03/31/23 1957 04/01/23 0323 04/01/23 0802 04/01/23 1700  BP: 127/62 129/81 130/74 124/70  Pulse: 66 60 (!) 58 60  Resp: 19 18 18 19   Temp: 97.9 F (36.6 C) 98 F (36.7 C) 98.2 F (36.8 C) 98.7 F (37.1 C)  TempSrc:    Oral  SpO2: 97% 99% 96% 97%  Height:       No intake or output data in the 24 hours ending 04/01/23 1848  There were no vitals filed for this visit.  Examination:   Constitutional:  NAD, sleepy but arousable HEENT: conjunctivae and lids normal, EOMI CV: No cyanosis.   RESP: normal respiratory effort, on RA  Foley present with some dark sediment, no gross hematuria   Data Reviewed: I have personally reviewed labs and imaging studies  Time spent: 35 minutes  [name], MD [facility] If 7PM-7AM, please contact night-coverage 04/01/2023, 6:48 PM

## 2023-04-01 NOTE — TOC Progression Note (Signed)
Transition of Care [facility]) - Progression Note    Patient Details  Name: ZAHI CONTEH MRN: [id number] Date of Birth: 12-29-1930  Transition of [facility] CM/SW Contact  Allena Katz, LCSW Phone Number: 04/01/2023, 3:38 PM  Clinical Narrative:   CSW spoke with tiffany at [facility] and somehow the auth that was pending got canceled and she is calling patients insurance to see what has happened.    Expected Discharge Plan: Skilled Nursing Facility Barriers to Discharge: Continued Medical Work up  Expected Discharge Plan and Services                                               Social Determinants of Health (SDOH) Interventions SDOH Screenings   Food Insecurity: No Food Insecurity (03/12/2023)  Housing: Low Risk  (03/12/2023)  Transportation Needs: No Transportation Needs (03/12/2023)  Utilities: Not At Risk (03/12/2023)  Financial Resource Strain: Low Risk  (10/12/2022)   Received from [facility]  Tobacco Use: Low Risk  (03/19/2023)  Health Literacy: High Risk (03/12/2023)   [facility] from [facility]    Readmission Risk Interventions     No data to display

## 2023-04-01 NOTE — Progress Notes (Signed)
Physical Therapy Treatment Patient Details Name: Thomas [name] MRN: [id number] DOB: [birth year] Today's Date: 04/01/2023   History of Present Illness Pt is a 89 y.o. male presenting to hospital 03/11/23 with generalized weakness, dizziness, and being off balance; came home from [facility] rehab 7 days prior after multifocal small vessel infarcts.  Imaging showing acute infarct of R anterior thalamus.  Pt admitted with acute CVA.  S/p 03/19/23 R CEA (PT/OT orders discontinued 03/19/23). Pt also noted with hematuria and acute urinary retention.  New PT/OT orders received 03/29/23.  PMH includes recent stroke 3 weeks ago, htn, HLD.    PT Comments  Pt alert and oriented to self and DOB, but disoriented to place and time. Pt pleasantly confused and very receptive to therapy. Pt denies pain t/o. ModA for sup > sit, max-modAx2 for STS with RW, and modAx2 for 2 feet amb turning to the right. Pt showed decreased ability to facilitate L step and heavy sequencing cues required. Pt continues to demonstrate L lean in sitting and standing; significant retropulsion noted after amb 2/2 fatigue. Pt would continue to benefit from acute PT to progress toward mobility goals.     If plan is discharge home, recommend the following: Two people to help with walking and/or transfers;Two people to help with bathing/dressing/bathroom;Assistance with cooking/housework;Assistance with feeding;Direct supervision/assist for medications management;Assist for transportation;Supervision due to cognitive status;Help with stairs or ramp for entrance   Can travel by private vehicle     No  Equipment Recommendations  Other (comment) (TBD at next level)    Recommendations for Other Services Rehab consult     Precautions / Restrictions Precautions Precautions: Fall Precaution Comments: L lat tibial wound Restrictions Weight Bearing Restrictions: No     Mobility  Bed Mobility Overal bed mobility: Needs Assistance Bed Mobility: Supine  to Sit     Supine to sit: HOB elevated, Used rails, Mod assist          Transfers Overall transfer level: Needs assistance Equipment used: Rolling walker (2 wheels) Transfers: Sit to/from Stand Sit to Stand: Max assist, Mod assist, +2 physical assistance                Ambulation/Gait Ambulation/Gait assistance: Mod assist, +2 physical assistance Gait Distance (Feet): 2 Feet Assistive device: Rolling walker (2 wheels) Gait Pattern/deviations: Decreased step length - right, Decreased step length - left, Decreased stance time - left Gait velocity: decreased     General Gait Details: L lean; decreased ability to take a small step forward with L LE; vc's for upright posture/midline positioning; pt very fatigued after 2 steps   Stairs             Wheelchair Mobility     Tilt Bed    Modified Rankin (Stroke Patients Only)       Balance Overall balance assessment: Needs assistance Sitting-balance support: Bilateral upper extremity supported, Feet supported Sitting balance-[name]: Fair Sitting balance - Comments: steady static sitting; able to correct L lean with tactile and verbal cuing   Standing balance support: Bilateral upper extremity supported, Reliant on assistive device for balance, During functional activity Standing balance-[name]: Poor Standing balance comment: heavy L lean; heavy retropulsion after taking a couple steps                            Cognition Arousal: Alert Behavior During Therapy: WFL for tasks assessed/performed Overall Cognitive Status: Impaired/Different from baseline Area of Impairment: Orientation,  Safety/judgement, Problem solving                 Orientation Level: Disoriented to, Place, Time, Situation     Following Commands: Follows one step commands with increased time Safety/Judgement: Decreased awareness of deficits   Problem Solving: Slow processing, Requires verbal cues, Requires  tactile cues, Decreased initiation, Difficulty sequencing          Exercises      General Comments        Pertinent Vitals/Pain Pain Assessment Pain Assessment: No/denies pain    Home Living                          Prior Function            PT Goals (current goals can now be found in the care plan section) Acute Rehab PT Goals Patient Stated Goal: to improve strength and mobility PT Goal Formulation: With patient Time For Goal Achievement: 04/12/23 Potential to Achieve Goals: Good Progress towards PT goals: Progressing toward goals    Frequency    Min 1X/week      PT Plan      Co-evaluation              AM-PAC PT "6 Clicks" Mobility   Outcome Measure  Help needed turning from your back to your side while in a flat bed without using bedrails?: A Lot Help needed moving from lying on your back to sitting on the side of a flat bed without using bedrails?: Total Help needed moving to and from a bed to a chair (including a wheelchair)?: Total Help needed standing up from a chair using your arms (e.g., wheelchair or bedside chair)?: Total Help needed to walk in hospital room?: Total Help needed climbing 3-5 steps with a railing? : Total 6 Click Score: 7    End of Session Equipment Utilized During Treatment: Gait belt Activity Tolerance: Patient limited by fatigue Patient left: with chair alarm set;with call bell/phone within reach;in chair Nurse Communication: Mobility status PT Visit Diagnosis: Unsteadiness on feet (R26.81);Other abnormalities of gait and mobility (R26.89);Repeated falls (R29.6);Muscle weakness (generalized) (M62.81);Hemiplegia and hemiparesis Hemiplegia - Right/Left: Left Hemiplegia - caused by: Cerebral infarction     Time: 8657-8469 PT Time Calculation (min) (ACUTE ONLY): 25 min  Charges:    $Gait Training: 8-22 mins $Therapeutic Activity: 8-22 mins PT General Charges $$ ACUTE PT VISIT: 1 Visit                        Shauna Hugh, SPT 04/01/2023, 3:57 PM

## 2023-04-01 NOTE — Progress Notes (Signed)
Occupational Therapy Treatment Patient Details Name: Thomas [name] MRN: [id number] DOB: 1930/05/18 Today's Date: 04/01/2023   History of present illness Pt is a 89 y.o. male presenting to hospital 03/11/23 with generalized weakness, dizziness, and being off balance; came home from [facility] rehab 7 days prior after multifocal small vessel infarcts.  Imaging showing acute infarct of R anterior thalamus.  Pt admitted with acute CVA.  S/p 03/19/23 R CEA (PT/OT orders discontinued 03/19/23). Pt also noted with hematuria and acute urinary retention.  New PT/OT orders received 03/29/23.  PMH includes recent stroke 3 weeks ago, htn, HLD.   OT comments  Pt seen for OT tx. Pt expressing need to get in touch with his son in law who is going to pick him up. Pt a bit tearful and appears anxious. Active listening and emotional support provided, reoriented to place and situation and pt appeared less tearful afterwards. Pt noted to have maybe eaten <20% of lunch. Pt endorsed ongoing difficulty using his hands for tasks 2/2 carpal tunnel and weakness. Pt educated in use of a built up foam handle and able to return demo proper use and ability to put utensil in and take out of the handle. Pt endorsed improved ability to reach his mouth. RN notified to support recall and carryover. Pt progressing slowly towards goals and continues to benefit from skilled OT services to maximize safety and independence.      If plan is discharge home, recommend the following:  A lot of help with walking and/or transfers;A lot of help with bathing/dressing/bathroom;Supervision due to cognitive status;Direct supervision/assist for financial management;Assistance with cooking/housework;Assist for transportation;Help with stairs or ramp for entrance;Direct supervision/assist for medications management;Assistance with feeding   Equipment Recommendations  Other (comment) (defer)    Recommendations for Other Services      Precautions /  Restrictions Precautions Precautions: Fall Precaution Comments: L lat tibial wound Restrictions Weight Bearing Restrictions: No       Mobility Bed Mobility               General bed mobility comments: NT pt in recliner pre/post session    Transfers                         Balance Overall balance assessment: Needs assistance Sitting-balance support: Single extremity supported, Feet supported Sitting balance-[name]: Poor Sitting balance - Comments: pillow positioned under LUE in recliner to improve upright posture for self feeding Postural control: Left lateral lean                                 ADL either performed or assessed with clinical judgement   ADL Overall ADL's : Needs assistance/impaired Eating/Feeding: Sitting;Set up Eating/Feeding Details (indicate cue type and reason): Pt endorsed continued difficulty using his hands for tasks 2/2 carpal tunnel and weakness. Pt noted to have maybe eaten <20% of lunch. Pt educated in use of a built up foam handle and able to return demo proper use and ability to put utensil in and take out of the handle. Pt endorsed improved ability to reach his mouth. RN notified to support recall and carryover. Grooming: Sitting;Set up Grooming Details (indicate cue type and reason): MIN VC for initiation and pt able to complete himself  Extremity/Trunk Assessment              Vision       Perception     Praxis      Cognition Arousal: Alert Behavior During Therapy: Lability Overall Cognitive Status: Impaired/Different from baseline Area of Impairment: Orientation, Safety/judgement, Problem solving                 Orientation Level: Disoriented to, Place, Time, Situation Current Attention Level: Sustained Memory: Decreased short-term memory Following Commands: Follows one step commands with increased time Safety/Judgement: Decreased  awareness of deficits Awareness: Emergent Problem Solving: Slow processing, Requires verbal cues, Requires tactile cues, Decreased initiation, Difficulty sequencing          Exercises      Shoulder Instructions       General Comments      Pertinent Vitals/ Pain       Pain Assessment Pain Assessment: No/denies pain  Home Living                                          Prior Functioning/Environment              Frequency  Min 1X/week        Progress Toward Goals  OT Goals(current goals can now be found in the care plan section)  Progress towards OT goals: Progressing toward goals (slowly)  Acute Rehab OT Goals Patient Stated Goal: get better OT Goal Formulation: With patient Time For Goal Achievement: 04/12/23 Potential to Achieve Goals: Good  Plan      Co-evaluation                 AM-PAC OT "6 Clicks" Daily Activity     Outcome Measure   Help from another person eating meals?: A Little Help from another person taking care of personal grooming?: A Little Help from another person toileting, which includes using toliet, bedpan, or urinal?: A Lot Help from another person bathing (including washing, rinsing, drying)?: A Lot Help from another person to put on and taking off regular upper body clothing?: A Little Help from another person to put on and taking off regular lower body clothing?: A Lot 6 Click Score: 15    End of Session    OT Visit Diagnosis: Unsteadiness on feet (R26.81);Other abnormalities of gait and mobility (R26.89);Repeated falls (R29.6);Muscle weakness (generalized) (M62.81);History of falling (Z91.81)   Activity Tolerance Patient tolerated treatment well   Patient Left in chair;with call bell/phone within reach;with chair alarm set   Nurse Communication Other (comment) (red foam handle for meals, no straws)        Time: 6578-4696 OT Time Calculation (min): 16 min  Charges: OT General Charges $OT  Visit: 1 Visit OT Treatments $Self Care/Home Management : 8-22 mins  Arman Filter., MPH, MS, OTR/L ascom [phone] 04/01/23, 4:06 PM

## 2023-04-02 DIAGNOSIS — I639 Cerebral infarction, unspecified: Secondary | ICD-10-CM | POA: Diagnosis not present

## 2023-04-02 MED ORDER — CLOPIDOGREL BISULFATE 75 MG PO TABS
75.0000 mg | ORAL_TABLET | Freq: Every day | ORAL | Status: DC
  Administered 2023-04-02 – 2023-04-19 (×18): 75 mg via ORAL
  Filled 2023-04-02 (×18): qty 1

## 2023-04-02 NOTE — Progress Notes (Signed)
PROGRESS NOTE    [name]  WUJ:[id number] DOB: December 06, 1930 DOA: 03/11/2023 PCP: Barbette Reichmann, MD  119A/119A-AA  LOS: 17 days   Brief hospital course:  Thomas [name] is a 89 y.o. male with medical history significant of hypertension, hyperlipidemia, recent CVA 3 weeks ago treated at [facility] was brought to the ED due to generalized weakness and dizziness being off balance x 1 day.    Patient came home from [facility] rehab 7 days PTA after multifocal small vessel infarcts.  Was doing well at home.  Ambulating on his own with good functional status, able to perform basic ADLs.  1 night before presentation to [facility] family states the patient did not want to eat dinner and started complaining of dizziness.  The morning of presentation patient was having trouble with coordination unable to raise himself out of bed.     On presentation to the ED MRI reveals new acute CVA.    Patient seen in consultation by neurology who ordered CTA to evaluate carotid anatomy. 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  Vascular surgery consulted and performed right carotid endarterectomy on 03/19/23.  Post-op, pt was more confused and pulled out his Foley causing penile trauma hematuria with subsequent acute urinary retention.  Assessment & Plan:   Acute CVA Right carotid stenosis S/p right carotid endarterectomy on 11/8 Patient presents 3 weeks after previous hospitalization for stroke and discharged from [facility].  Now with evidence of new acute CVA on imaging.  Pt was compliance with dual antiplatelet therapy and antihyperlipidemic therapy at home. --LDL 87.  switched home statin to lipitor 40 Plan: --cont ASA --resume plavix today --cont Lipitor 40   Hematuria 2/2 penile trauma --per nursing, pt was confused post-op and pulled out his Foley.   --urology consulted --resume plavix today since hematuria seems to have resolved  Acute  urinary retention --PVR 699 on 11/11 --urology consulted, Foley inserted --cont Flomax (new) --f/u with [facility] for a voiding trial in a week or two   AKI --Cr up to 2.24 this morning.  Baseline around 1. --improved with IVF --encourage oral hydration  Essential hypertension --BP has been low during hospitalization.   Hyperlipidemia --LDL 87.  switched home statin to lipitor 40 --cont statin   GERD --cont PPI  Hospital delirium --more confused since the surgery. --supportive care  Bilious vomiting --started morning of 11/10, resolved by next day --KUB showed mod stool burden in rectum --Ensure BM's.   DVT prophylaxis: SCD/Compression stockings Code Status: DNR  Family Communication:  Level of care: Med-Surg Dispo:   The patient is from: home Anticipated d/c is to: SNF rehab Anticipated d/c date is: whenever bed available   Subjective and Interval History:  Pt reported feeling better than yesterday.   Objective: Vitals:   04/01/23 1700 04/01/23 1954 04/02/23 0842 04/02/23 1543  BP: 124/70 (!) 132/95 (!) 109/94 117/66  Pulse: 60 68 61 68  Resp: 19 18 14 17   Temp: 98.7 F (37.1 C) 98 F (36.7 C) 98.6 F (37 C) 97.9 F (36.6 C)  TempSrc: Oral     SpO2: 97% 99% 95% 97%  Height:        Intake/Output Summary (Last 24 hours) at 04/02/2023 1917 Last data filed at 04/02/2023 1130 Gross per 24 hour  Intake 3 ml  Output 700 ml  Net -697 ml    There were no vitals filed for this visit.  Examination:   Constitutional: NAD, alert, oriented to person and place HEENT: conjunctivae and lids normal, EOMI CV: No cyanosis.   RESP: normal respiratory effort, on RA Neuro: II - XII grossly intact.   Psych: Normal mood and affect.   Foley present, no obvious hematuria   Data Reviewed: I have personally reviewed labs and imaging studies  Time spent: 35 minutes  [name], MD [facility] If 7PM-7AM, please contact  night-coverage 04/02/2023, 7:17 PM

## 2023-04-02 NOTE — Progress Notes (Signed)
Physical Therapy Treatment Patient Details Name: Thomas [name] MRN: [id number] DOB: [birth year] Today's Date: 04/02/2023   History of Present Illness Pt is a 89 y.o. male presenting to hospital 03/11/23 with generalized weakness, dizziness, and being off balance; came home from [facility] rehab 7 days prior after multifocal small vessel infarcts.  Imaging showing acute infarct of R anterior thalamus.  Pt admitted with acute CVA.  S/p 03/19/23 R CEA (PT/OT orders discontinued 03/19/23). Pt also noted with hematuria and acute urinary retention.  New PT/OT orders received 03/29/23.  PMH includes recent stroke 3 weeks ago, htn, HLD.    PT Comments  Pt alert and oriented to self, situation, and place, but presenting with increased emotional lability and time to follow simple commands this session. Active listening and support provided. Pt denies pain and continues to appear pleasantly confused and appreciative of PT. ModA for bed mobility, L lateral and posterior lean observed in sitting, but able to correct with modA and tactile/verbal cueing. 3 STS trials performed with RW; 1st with maxA and 2nd and 3rd with modA. Pt required verbal/tactile cuing for proper hand/foot placement for each trial. Further mobility deferred d/t emotional lability, fatigue, and pt just being returned to bed prior to session. Pt would benefit from continued therapy to progress toward mobility goals and maximize functional independence.   If plan is discharge home, recommend the following: Two people to help with walking and/or transfers;Two people to help with bathing/dressing/bathroom;Assistance with cooking/housework;Assistance with feeding;Direct supervision/assist for medications management;Assist for transportation;Supervision due to cognitive status;Help with stairs or ramp for entrance   Can travel by private vehicle     No  Equipment Recommendations  Other (comment) (defer to next level of care)    Recommendations for Other  Services       Precautions / Restrictions Precautions Precautions: Fall Restrictions Weight Bearing Restrictions: No     Mobility  Bed Mobility Overal bed mobility: Needs Assistance Bed Mobility: Supine to Sit, Sit to Supine     Supine to sit: HOB elevated, Used rails, Mod assist Sit to supine: Mod assist   General bed mobility comments: pt slow to process simple commands and demonstrated increased posterior lean with sitting EOB; able to maintain forward sitting posture with verbal and tactile cues    Transfers Overall transfer level: Needs assistance Equipment used: Rolling walker (2 wheels) Transfers: Sit to/from Stand Sit to Stand: Mod assist, Max assist           General transfer comment: STS x 3 from EOB; trial 1 required maxA at trunk and verbal cuing to stand upright; trials 2 and 3 required modA and less postural cuing; pt required cuing for proper hand and feet placement for all trials    Ambulation/Gait               General Gait Details: deferred 2/2 fatigue and emotional lability   Stairs             Wheelchair Mobility     Tilt Bed    Modified Rankin (Stroke Patients Only)       Balance Overall balance assessment: Needs assistance Sitting-balance support: Single extremity supported, Feet supported Sitting balance-[name]: Fair Sitting balance - Comments: L lateral and posterior lean today sitting EOB; able to correct with minA verbal and tactile cuing; required CGA for balance Postural control: Posterior lean, Left lateral lean Standing balance support: Bilateral upper extremity supported, Reliant on assistive device for balance, During functional activity Standing balance-[name]:  Poor Standing balance comment: heavy L lean and posterior weightshift                            Cognition Arousal: Alert Behavior During Therapy: Lability (emotional) Overall Cognitive Status: Impaired/Different from baseline                                           Exercises      General Comments        Pertinent Vitals/Pain Pain Assessment Pain Assessment: No/denies pain    Home Living                          Prior Function            PT Goals (current goals can now be found in the care plan section) Acute Rehab PT Goals Patient Stated Goal: to improve strength and mobility PT Goal Formulation: With patient Time For Goal Achievement: 04/12/23 Potential to Achieve Goals: Fair Progress towards PT goals: Progressing toward goals    Frequency    Min 1X/week      PT Plan      Co-evaluation              AM-PAC PT "6 Clicks" Mobility   Outcome Measure  Help needed turning from your back to your side while in a flat bed without using bedrails?: A Lot Help needed moving from lying on your back to sitting on the side of a flat bed without using bedrails?: Total Help needed moving to and from a bed to a chair (including a wheelchair)?: Total Help needed standing up from a chair using your arms (e.g., wheelchair or bedside chair)?: Total Help needed to walk in hospital room?: Total Help needed climbing 3-5 steps with a railing? : Total 6 Click Score: 7    End of Session Equipment Utilized During Treatment: Gait belt Activity Tolerance: Patient limited by fatigue Patient left: in bed;with call bell/phone within reach;with bed alarm set Nurse Communication: Mobility status PT Visit Diagnosis: Unsteadiness on feet (R26.81);Other abnormalities of gait and mobility (R26.89);Repeated falls (R29.6);Muscle weakness (generalized) (M62.81);Hemiplegia and hemiparesis Hemiplegia - Right/Left: Left Hemiplegia - caused by: Cerebral infarction     Time: 1610-9604 PT Time Calculation (min) (ACUTE ONLY): 13 min  Charges:    $Therapeutic Activity: 8-22 mins PT General Charges $$ ACUTE PT VISIT: 1 Visit                       Shauna Hugh, SPT 04/02/2023, 2:56  PM

## 2023-04-02 NOTE — Plan of Care (Signed)
Pt alert to self, forgetful and confused at times. Weakness noted to left side from CVA able to swallow meds crushed with applesauce. Pt has a Foley for urinary retention. Pt requires a two person assist up to chair or bedside commode. POC waiting on placement to SNF for rehab.  Problem: Education: Goal: Knowledge of disease or condition will improve Outcome: Progressing Goal: Knowledge of secondary prevention will improve (MUST DOCUMENT ALL) Outcome: Progressing Goal: Knowledge of patient specific risk factors will improve Loraine Leriche N/A or DELETE if not current risk factor) Outcome: Progressing   Problem: Ischemic Stroke/TIA Tissue Perfusion: Goal: Complications of ischemic stroke/TIA will be minimized Outcome: Progressing   Problem: Coping: Goal: Will verbalize positive feelings about self Outcome: Progressing Goal: Will identify appropriate support needs Outcome: Progressing   Problem: Health Behavior/Discharge Planning: Goal: Ability to manage health-related needs will improve Outcome: Progressing Goal: Goals will be collaboratively established with patient/family Outcome: Progressing   Problem: Self-Care: Goal: Ability to participate in self-care as condition permits will improve Outcome: Progressing Goal: Verbalization of feelings and concerns over difficulty with self-care will improve Outcome: Progressing Goal: Ability to communicate needs accurately will improve Outcome: Progressing   Problem: Nutrition: Goal: Risk of aspiration will decrease Outcome: Progressing Goal: Dietary intake will improve Outcome: Progressing   Problem: Education: Goal: Knowledge of General Education information will improve Description: Including pain rating scale, medication(s)/side effects and non-pharmacologic comfort measures Outcome: Progressing   Problem: Health Behavior/Discharge Planning: Goal: Ability to manage health-related needs will improve Outcome: Progressing   Problem:  Clinical Measurements: Goal: Ability to maintain clinical measurements within normal limits will improve Outcome: Progressing Goal: Will remain free from infection Outcome: Progressing Goal: Diagnostic test results will improve Outcome: Progressing Goal: Respiratory complications will improve Outcome: Progressing Goal: Cardiovascular complication will be avoided Outcome: Progressing   Problem: Activity: Goal: Risk for activity intolerance will decrease Outcome: Progressing   Problem: Nutrition: Goal: Adequate nutrition will be maintained Outcome: Progressing   Problem: Coping: Goal: Level of anxiety will decrease Outcome: Progressing   Problem: Elimination: Goal: Will not experience complications related to bowel motility Outcome: Progressing Goal: Will not experience complications related to urinary retention Outcome: Progressing   Problem: Pain Management: Goal: General experience of comfort will improve Outcome: Progressing   Problem: Safety: Goal: Ability to remain free from injury will improve Outcome: Progressing   Problem: Skin Integrity: Goal: Risk for impaired skin integrity will decrease Outcome: Progressing   Problem: Education: Goal: Knowledge of discharge needs will improve Outcome: Progressing   Problem: Clinical Measurements: Goal: Postoperative complications will be avoided or minimized Outcome: Progressing   Problem: Respiratory: Goal: Ability to achieve and maintain a regular respiratory rate will improve Outcome: Progressing   Problem: Skin Integrity: Goal: Demonstration of wound healing without infection will improve Outcome: Progressing

## 2023-04-03 DIAGNOSIS — I639 Cerebral infarction, unspecified: Secondary | ICD-10-CM | POA: Diagnosis not present

## 2023-04-03 NOTE — Progress Notes (Signed)
PROGRESS NOTE    Thomas [name]  EAV:[id number] DOB: 1930-08-31 DOA: 03/11/2023 PCP: Barbette Reichmann, MD  119A/119A-AA  LOS: 18 days   Brief hospital course:  Thomas [name] is a 89 y.o. male with medical history significant of hypertension, hyperlipidemia, recent CVA 3 weeks ago treated at [facility] was brought to the ED due to generalized weakness and dizziness being off balance x 1 day.    Patient came home from [facility] rehab 7 days PTA after multifocal small vessel infarcts.  Was doing well at home.  Ambulating on his own with good functional status, able to perform basic ADLs.  1 night before presentation to [facility] family states the patient did not want to eat dinner and started complaining of dizziness.  The morning of presentation patient was having trouble with coordination unable to raise himself out of bed.     On presentation to the ED MRI reveals new acute CVA.    Patient seen in consultation by neurology who ordered CTA to evaluate carotid anatomy. 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  Vascular surgery consulted and performed right carotid endarterectomy on 03/19/23.  Post-op, pt was more confused and pulled out his Foley causing penile trauma hematuria with subsequent acute urinary retention.  Assessment & Plan:   Acute CVA Right carotid stenosis S/p right carotid endarterectomy on 11/8 Patient presents 3 weeks after previous hospitalization for stroke and discharged from [facility].  Now with evidence of new acute CVA on imaging.  Pt was compliance with dual antiplatelet therapy and antihyperlipidemic therapy at home. --LDL 87.  switched home statin to lipitor 40 Plan: --cont ASA --cont plavix (resumed on 11/22) --cont Lipitor 40   Hematuria 2/2 penile trauma --per nursing, pt was confused post-op and pulled out his Foley.   --urology consulted --monitor for hematuria after plavix resumed  Acute  urinary retention --PVR 699 on 11/11 --urology consulted, Foley inserted --cont Flomax (new) --f/u with [facility] for a voiding trial in a week or two   AKI --Cr up to 2.24 this morning.  Baseline around 1. --improved with IVF --encourage oral hydration  Essential hypertension --BP has been low during hospitalization.   Hyperlipidemia --LDL 87.  switched home statin to lipitor 40 --cont statin   GERD --cont PPI  Hospital delirium --more confused since the surgery. --supportive care  Bilious vomiting --started morning of 11/10, resolved by next day --KUB showed mod stool burden in rectum --Ensure BM's.   DVT prophylaxis: SCD/Compression stockings Code Status: DNR  Family Communication: daughter updated at bedside today Level of care: Med-Surg Dispo:   The patient is from: home Anticipated d/c is to: SNF rehab Anticipated d/c date is: whenever bed available   Subjective and Interval History:  Pt reported doing well today, ate well.   Objective: Vitals:   04/03/23 0427 04/03/23 0734 04/03/23 1616 04/03/23 1811  BP: (!) 121/92 137/78 123/62 (!) 100/59  Pulse: 66 66 64 79  Resp: 18 20 (!) 22 16  Temp: 98.6 F (37 C) 98.4 F (36.9 C) 98.2 F (36.8 C) 99.5 F (37.5 C)  TempSrc:      SpO2: 96% 93% 97% 96%  Height:        Intake/Output Summary (Last 24 hours) at 04/03/2023 1906 Last data filed at 04/03/2023 0700 Gross per 24 hour  Intake --  Output 725 ml  Net -725 ml    There were no vitals filed  for this visit.  Examination:   Constitutional: NAD, alert HEENT: conjunctivae and lids normal, EOMI CV: No cyanosis.   RESP: normal respiratory effort, on RA Neuro: II - XII grossly intact.   Psych: Normal mood and affect.   Foley present, no obvious hematuria   Data Reviewed: I have personally reviewed labs and imaging studies  Time spent: 35 minutes  [name], MD [facility] If 7PM-7AM, please contact  night-coverage 04/03/2023, 7:06 PM

## 2023-04-04 DIAGNOSIS — I639 Cerebral infarction, unspecified: Secondary | ICD-10-CM | POA: Diagnosis not present

## 2023-04-04 MED ORDER — POLYETHYLENE GLYCOL 3350 17 G PO PACK
17.0000 g | PACK | Freq: Two times a day (BID) | ORAL | Status: DC
  Administered 2023-04-04 – 2023-04-06 (×5): 17 g via ORAL
  Filled 2023-04-04 (×7): qty 1

## 2023-04-04 MED ORDER — ASPIRIN 81 MG PO CHEW
81.0000 mg | CHEWABLE_TABLET | Freq: Every day | ORAL | Status: DC
  Administered 2023-04-04 – 2023-04-19 (×16): 81 mg via ORAL
  Filled 2023-04-04 (×15): qty 1

## 2023-04-04 NOTE — Plan of Care (Signed)
Problem: Education: Goal: Knowledge of disease or condition will improve Outcome: Not Progressing Goal: Knowledge of secondary prevention will improve (MUST DOCUMENT ALL) Outcome: Not Progressing Goal: Knowledge of patient specific risk factors will improve Thomas [name] N/A or DELETE if not current risk factor) Outcome: Not Progressing   Problem: Ischemic Stroke/TIA Tissue Perfusion: Goal: Complications of ischemic stroke/TIA will be minimized Outcome: Not Progressing   Problem: Coping: Goal: Will verbalize positive feelings about self Outcome: Not Progressing Goal: Will identify appropriate support needs Outcome: Not Progressing   Problem: Health Behavior/Discharge Planning: Goal: Ability to manage health-related needs will improve Outcome: Not Progressing Goal: Goals will be collaboratively established with patient/family Outcome: Not Progressing   Problem: Self-Care: Goal: Ability to participate in self-care as condition permits will improve Outcome: Not Progressing Goal: Verbalization of feelings and concerns over difficulty with self-care will improve Outcome: Not Progressing Goal: Ability to communicate needs accurately will improve Outcome: Not Progressing   Problem: Nutrition: Goal: Risk of aspiration will decrease Outcome: Not Progressing Goal: Dietary intake will improve Outcome: Not Progressing   Problem: Education: Goal: Knowledge of General Education information will improve Description: Including pain rating scale, medication(s)/side effects and non-pharmacologic comfort measures Outcome: Not Progressing   Problem: Health Behavior/Discharge Planning: Goal: Ability to manage health-related needs will improve Outcome: Not Progressing   Problem: Clinical Measurements: Goal: Ability to maintain clinical measurements within normal limits will improve Outcome: Not Progressing Goal: Will remain free from infection Outcome: Not Progressing Goal: Diagnostic test  results will improve Outcome: Not Progressing Goal: Respiratory complications will improve Outcome: Not Progressing Goal: Cardiovascular complication will be avoided Outcome: Not Progressing   Problem: Activity: Goal: Risk for activity intolerance will decrease Outcome: Not Progressing   Problem: Nutrition: Goal: Adequate nutrition will be maintained Outcome: Not Progressing   Problem: Coping: Goal: Level of anxiety will decrease Outcome: Not Progressing   Problem: Elimination: Goal: Will not experience complications related to bowel motility Outcome: Not Progressing Goal: Will not experience complications related to urinary retention Outcome: Not Progressing   Problem: Pain Management: Goal: General experience of comfort will improve Outcome: Not Progressing   Problem: Safety: Goal: Ability to remain free from injury will improve Outcome: Not Progressing   Problem: Skin Integrity: Goal: Risk for impaired skin integrity will decrease Outcome: Not Progressing   Problem: Education: Goal: Knowledge of discharge needs will improve Outcome: Not Progressing   Problem: Clinical Measurements: Goal: Postoperative complications will be avoided or minimized Outcome: Not Progressing   Problem: Respiratory: Goal: Ability to achieve and maintain a regular respiratory rate will improve Outcome: Not Progressing   Problem: Skin Integrity: Goal: Demonstration of wound healing without infection will improve Outcome: Not Progressing

## 2023-04-04 NOTE — Progress Notes (Signed)
PROGRESS NOTE    Thomas [name]  WJX:[id number] DOB: 07-Feb-1931 DOA: 03/11/2023 PCP: Barbette Reichmann, MD  119A/119A-AA  LOS: 19 days   Brief hospital course:  Thomas [name] is a 89 y.o. male with medical history significant of hypertension, hyperlipidemia, recent CVA 3 weeks ago treated at [facility] was brought to the ED due to generalized weakness and dizziness being off balance x 1 day.    Patient came home from [facility] rehab 7 days PTA after multifocal small vessel infarcts.  Was doing well at home.  Ambulating on his own with good functional status, able to perform basic ADLs.  1 night before presentation to [facility] family states the patient did not want to eat dinner and started complaining of dizziness.  The morning of presentation patient was having trouble with coordination unable to raise himself out of bed.     On presentation to the ED MRI reveals new acute CVA.    Patient seen in consultation by neurology who ordered CTA to evaluate carotid anatomy. 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  Vascular surgery consulted and performed right carotid endarterectomy on 03/19/23.  Post-op, pt was more confused and pulled out his Foley causing penile trauma hematuria with subsequent acute urinary retention.  Assessment & Plan:   Acute CVA Right carotid stenosis S/p right carotid endarterectomy on 11/8 Patient presents 3 weeks after previous hospitalization for stroke and discharged from [facility].  Now with evidence of new acute CVA on imaging.  Pt was compliance with dual antiplatelet therapy and antihyperlipidemic therapy at home. --LDL 87.  switched home statin to lipitor 40 Plan: --cont ASA --cont plavix (resumed on 11/22) --cont Lipitor 40   Hematuria 2/2 penile trauma --per nursing, pt was confused post-op and pulled out his Foley.   --urology consulted --monitor for hematuria after plavix resumed  Acute  urinary retention --PVR 699 on 11/11 --urology consulted, Foley inserted --cont Flomax (new) --f/u with [facility] for a voiding trial in a week or two   AKI --Cr up to 2.24 this morning.  Baseline around 1. --improved with IVF --encourage oral hydration  Essential hypertension --BP has been low during hospitalization.   Hyperlipidemia --LDL 87.  switched home statin to lipitor 40 --cont statin   GERD --cont PPI  Hospital delirium --more confused since the surgery. --supportive care  Bilious vomiting --started morning of 11/10, resolved by next day --KUB showed mod stool burden in rectum --Ensure BM's.   DVT prophylaxis: SCD/Compression stockings Code Status: DNR  Family Communication:  Level of care: Med-Surg Dispo:   The patient is from: home Anticipated d/c is to: SNF rehab Anticipated d/c date is: whenever bed available   Subjective and Interval History:  No new event today.  Urine remain clear without hematuria.   Objective: Vitals:   04/04/23 0520 04/04/23 0809 04/04/23 1605 04/04/23 1948  BP: 113/66 117/79 128/86 97/72  Pulse: 66 66 63 69  Resp: 18 16 16 19   Temp: 98.1 F (36.7 C) 98.3 F (36.8 C) 97.8 F (36.6 C) 98 F (36.7 C)  TempSrc:      SpO2: 95% 96% 97% 96%  Height:        Intake/Output Summary (Last 24 hours) at 04/04/2023 2040 Last data filed at 04/04/2023 1700 Gross per 24 hour  Intake --  Output 750 ml  Net -750 ml    There were no vitals filed for this visit.  Examination:   Constitutional: NAD CV: No cyanosis.   RESP: normal respiratory effort, on RA Foley present, no obvious hematuria   Data Reviewed: I have personally reviewed labs and imaging studies  Time spent: 25 minutes  [name], MD [facility] If 7PM-7AM, please contact night-coverage 04/04/2023, 8:40 PM

## 2023-04-05 DIAGNOSIS — I639 Cerebral infarction, unspecified: Secondary | ICD-10-CM | POA: Diagnosis not present

## 2023-04-05 LAB — BASIC METABOLIC PANEL
Anion gap: 7 (ref 5–15)
BUN: 16 mg/dL (ref 8–23)
CO2: 25 mmol/L (ref 22–32)
Calcium: 8.5 mg/dL — ABNORMAL LOW (ref 8.9–10.3)
Chloride: 108 mmol/L (ref 98–111)
Creatinine, Ser: 1.14 mg/dL (ref 0.61–1.24)
GFR, Estimated: >60 mL/min (ref >60)
Glucose, Bld: 88 mg/dL (ref 70–99)
Potassium: 4.3 mmol/L (ref 3.5–5.1)
Sodium: 140 mmol/L (ref 135–145)

## 2023-04-05 LAB — CBC
HCT: 30.9 % — ABNORMAL LOW (ref 39.0–52.0)
Hemoglobin: 10 g/dL — ABNORMAL LOW (ref 13.0–17.0)
MCH: 30.3 pg (ref 26.0–34.0)
MCHC: 32.4 g/dL (ref 30.0–36.0)
MCV: 93.6 fL (ref 80.0–100.0)
Platelets: 254 10*3/uL (ref 150–400)
RBC: 3.3 MIL/uL — ABNORMAL LOW (ref 4.22–5.81)
RDW: 13.6 % (ref 11.5–15.5)
WBC: 7.7 10*3/uL (ref 4.0–10.5)
nRBC: 0 % (ref 0.0–0.2)

## 2023-04-05 LAB — MAGNESIUM: Magnesium: 2.2 mg/dL (ref 1.7–2.4)

## 2023-04-05 NOTE — Plan of Care (Signed)
  Problem: Ischemic Stroke/TIA Tissue Perfusion: Goal: Complications of ischemic stroke/TIA will be minimized Outcome: Progressing   Problem: Coping: Goal: Will identify appropriate support needs Outcome: Progressing   Problem: Health Behavior/Discharge Planning: Goal: Goals will be collaboratively established with patient/family Outcome: Progressing   Problem: Clinical Measurements: Goal: Ability to maintain clinical measurements within normal limits will improve Outcome: Progressing Goal: Respiratory complications will improve Outcome: Progressing Goal: Cardiovascular complication will be avoided Outcome: Progressing   Problem: Pain Management: Goal: General experience of comfort will improve Outcome: Progressing   Problem: Education: Goal: Knowledge of disease or condition will improve Outcome: Not Progressing   Problem: Self-Care: Goal: Ability to communicate needs accurately will improve Outcome: Not Progressing   Problem: Nutrition: Goal: Risk of aspiration will decrease Outcome: Not Progressing   Problem: Education: Goal: Knowledge of General Education information will improve Description: Including pain rating scale, medication(s)/side effects and non-pharmacologic comfort measures Outcome: Not Progressing   Problem: Elimination: Goal: Will not experience complications related to urinary retention Outcome: Not Progressing

## 2023-04-05 NOTE — Progress Notes (Signed)
PROGRESS NOTE    JANSON SICOTTE  QMV:[id number] DOB: [birth year] DOA: 03/11/2023 PCP: Barbette Reichmann, MD  119A/119A-AA  LOS: 20 days   Brief hospital course:  Thomas [name] is a 89 y.o. male with medical history significant of hypertension, hyperlipidemia, recent CVA 3 weeks ago treated at [facility] was brought to the ED due to generalized weakness and dizziness being off balance x 1 day.    Patient came home from [facility] rehab 7 days PTA after multifocal small vessel infarcts.  Was doing well at home.  Ambulating on his own with good functional status, able to perform basic ADLs.  1 night before presentation to [facility] family states the patient did not want to eat dinner and started complaining of dizziness.  The morning of presentation patient was having trouble with coordination unable to raise himself out of bed.     On presentation to the ED MRI reveals new acute CVA.    Patient seen in consultation by neurology who ordered CTA to evaluate carotid anatomy. 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  Vascular surgery consulted and performed right carotid endarterectomy on 03/19/23.  Post-op, pt was more confused and pulled out his Foley causing penile trauma hematuria with subsequent acute urinary retention.  Assessment & Plan:   Acute CVA Right carotid stenosis S/p right carotid endarterectomy on 11/8 Patient presents 3 weeks after previous hospitalization for stroke and discharged from [facility].  Now with evidence of new acute CVA on imaging.  Pt was compliance with dual antiplatelet therapy and antihyperlipidemic therapy at home. --LDL 87.  switched home statin to lipitor 40 Plan: --cont ASA --cont plavix (resumed on 11/22) --cont Lipitor 40   Hematuria 2/2 penile trauma --per nursing, pt was confused post-op and pulled out his Foley.   --urology consulted --monitor for hematuria after plavix resumed  Acute  urinary retention --PVR 699 on 11/11 --urology consulted, Foley inserted --cont Flomax (new) --f/u with [facility] for a voiding trial in a week or two   AKI --Cr up to 2.24 this morning.  Baseline around 1. --improved with IVF --encourage oral hydration  Essential hypertension --BP has been low during hospitalization.   Hyperlipidemia --LDL 87.  switched home statin to lipitor 40 --cont statin   GERD --cont PPI  Hospital delirium --more confused since the surgery. --supportive care  Bilious vomiting --started morning of 11/10, resolved by next day --KUB showed mod stool burden in rectum --Ensure BM's.   DVT prophylaxis: SCD/Compression stockings Code Status: DNR  Family Communication:  Level of care: Med-Surg Dispo:   The patient is from: home Anticipated d/c is to: SNF rehab Anticipated d/c date is: whenever bed available   Subjective and Interval History:  Pt reported doing well, feeding himself.   Objective: Vitals:   04/05/23 0431 04/05/23 0814 04/05/23 1723 04/05/23 2029  BP: (!) 113/94 127/69 122/81 126/63  Pulse: 68 61 70 72  Resp:  18 14 18   Temp: 98.5 F (36.9 C) 98.2 F (36.8 C) 97.8 F (36.6 C) 97.6 F (36.4 C)  TempSrc:      SpO2: 93% 93% 97% 95%  Height:       No intake or output data in the 24 hours ending 04/05/23 2143   There were no vitals filed for this visit.  Examination:   Constitutional: NAD, alert HEENT: conjunctivae and lids normal, EOMI CV: No cyanosis.   RESP: normal respiratory effort, on  RA Neuro: II - XII grossly intact.   Psych: Normal mood and affect.   Foley present, no obvious hematuria   Data Reviewed: I have personally reviewed labs and imaging studies  Time spent: 25 minutes  [name], MD [facility] If 7PM-7AM, please contact night-coverage 04/05/2023, 9:43 PM

## 2023-04-05 NOTE — TOC Progression Note (Signed)
Transition of Care [facility]) - Progression Note    Patient Details  Name: Thomas [name] MRN: [id number] Date of Birth: [birth year]  Transition of Care [facility]) CM/SW Contact  Allena Katz, LCSW Phone Number: 04/05/2023, 9:06 AM  Clinical Narrative:   CSW spoke with [facility] at [facility] who reports auth still pending.    Expected Discharge Plan: Skilled Nursing Facility Barriers to Discharge: Continued Medical Work up  Expected Discharge Plan and Services                                               Social Determinants of Health (SDOH) Interventions SDOH Screenings   Food Insecurity: No Food Insecurity (03/12/2023)  Housing: Low Risk  (03/12/2023)  Transportation Needs: No Transportation Needs (03/12/2023)  Utilities: Not At Risk (03/12/2023)  Financial Resource Strain: Low Risk  (10/12/2022)   Received from [facility]  Tobacco Use: Low Risk  (03/19/2023)  Health Literacy: High Risk (03/12/2023)   Received from [facility]    Readmission Risk Interventions     No data to display

## 2023-04-05 NOTE — Progress Notes (Signed)
Physical Therapy Treatment Patient Details Name: Thomas [name] MRN: [id number] DOB: 15-Oct-1930 Today's Date: 04/05/2023   History of Present Illness Pt is a 89 y.o. male presenting to hospital 03/11/23 with generalized weakness, dizziness, and being off balance; came home from [facility] rehab 7 days prior after multifocal small vessel infarcts.  Imaging showing acute infarct of R anterior thalamus.  Pt admitted with acute CVA.  S/p 03/19/23 R CEA (PT/OT orders discontinued 03/19/23). Pt also noted with hematuria and acute urinary retention.  New PT/OT orders received 03/29/23.  PMH includes recent stroke 3 weeks ago, htn, HLD.    PT Comments  Pt resting in bed upon PT arrival; agreeable to therapy; no c/o pain during session.  Pt mod assist semi-supine to sitting EOB; max assist to stand from bed (pt then reporting needing to have BM so therapist had pt sit back down on bed and BSC set-up); mod assist to stand from bed; max assist stand step turn bed to [facility] with RW use; NT present providing peri-care post toileting; mod assist to stand from [facility]; and mod assist x2 to ambulate short distance BSC to recliner with RW use (see below for details).  Will continue to progress pt with strengthening, balance, and progressive functional mobility during hospitalization.   If plan is discharge home, recommend the following: Two people to help with walking and/or transfers;Two people to help with bathing/dressing/bathroom;Assistance with cooking/housework;Assistance with feeding;Direct supervision/assist for medications management;Assist for transportation;Supervision due to cognitive status;Help with stairs or ramp for entrance   Can travel by private vehicle     No  Equipment Recommendations  Other (comment) (TBD at next venue of care)    Recommendations for Other Services       Precautions / Restrictions Precautions Precautions: Fall Restrictions Weight Bearing Restrictions: No     Mobility  Bed  Mobility Overal bed mobility: Needs Assistance Bed Mobility: Supine to Sit     Supine to sit: HOB elevated, Used rails, Mod assist     General bed mobility comments: assist for trunk; vc's for technique; assist to scoot to edge of bed    Transfers Overall transfer level: Needs assistance Equipment used: Rolling walker (2 wheels) Transfers: Sit to/from Stand, Bed to chair/wheelchair/BSC Sit to Stand: Mod assist, Max assist   Step pivot transfers: Max assist (stand step turn bed to [facility] with RW use; assist to steady; vc's for technique)       General transfer comment: max assist to stand 1st trial from bed; mod assist 2nd trial to stand from bed; mod assist to stand from [facility]; vc's for UE/LE placement; assist to initiate and come to full stand and control descent sitting    Ambulation/Gait Ambulation/Gait assistance: Mod assist, +2 physical assistance Gait Distance (Feet): 4 Feet (BSC to recliner) Assistive device: Rolling walker (2 wheels) Gait Pattern/deviations: Decreased step length - right, Decreased step length - left, Decreased stance time - left Gait velocity: decreased     General Gait Details: assist to steady; vc's for upright posture; assist for weightshifting to take steps at times; increased effort/time to take steps   Stairs             Wheelchair Mobility     Tilt Bed    Modified Rankin (Stroke Patients Only)       Balance Overall balance assessment: Needs assistance Sitting-balance support: No upper extremity supported, Feet supported Sitting balance-[name]: Fair Sitting balance - Comments: steady static sitting; mild L lateral lean noted Postural  control: Left lateral lean Standing balance support: Bilateral upper extremity supported, Reliant on assistive device for balance Standing balance-[name]: Poor Standing balance comment: L lateral lean noted in standing; vc's and assist for upright posture                             Cognition Arousal: Alert Behavior During Therapy: WFL for tasks assessed/performed Overall Cognitive Status: No family/caregiver present to determine baseline cognitive functioning Area of Impairment: Orientation, Safety/judgement, Problem solving                 Orientation Level: Disoriented to, Place, Time, Situation Current Attention Level: Sustained Memory: Decreased short-term memory Following Commands: Follows one step commands with increased time Safety/Judgement: Decreased awareness of deficits Awareness: Emergent Problem Solving: Slow processing, Requires verbal cues, Requires tactile cues, Decreased initiation, Difficulty sequencing          Exercises      General Comments  Pt agreeable to PT session.      Pertinent Vitals/Pain Pain Assessment Pain Assessment: No/denies pain Pain Intervention(s): Limited activity within patient's tolerance, Monitored during session, Repositioned Vitals (HR and SpO2 on room air) stable and WFL throughout treatment session.    Home Living                          Prior Function            PT Goals (current goals can now be found in the care plan section) Acute Rehab PT Goals Patient Stated Goal: to improve strength and mobility PT Goal Formulation: With patient Time For Goal Achievement: 04/12/23 Potential to Achieve Goals: Fair Progress towards PT goals: Progressing toward goals    Frequency    Min 1X/week      PT Plan      Co-evaluation              AM-PAC PT "6 Clicks" Mobility   Outcome Measure  Help needed turning from your back to your side while in a flat bed without using bedrails?: A Lot Help needed moving from lying on your back to sitting on the side of a flat bed without using bedrails?: A Lot Help needed moving to and from a bed to a chair (including a wheelchair)?: A Lot Help needed standing up from a chair using your arms (e.g., wheelchair or bedside chair)?: A  Lot Help needed to walk in hospital room?: Total Help needed climbing 3-5 steps with a railing? : Total 6 Click Score: 10    End of Session Equipment Utilized During Treatment: Gait belt Activity Tolerance: Patient tolerated treatment well Patient left: in chair;with call bell/phone within reach;with chair alarm set Nurse Communication: Mobility status;Precautions (NT notified and present assisting during session) PT Visit Diagnosis: Unsteadiness on feet (R26.81);Other abnormalities of gait and mobility (R26.89);Repeated falls (R29.6);Muscle weakness (generalized) (M62.81);Hemiplegia and hemiparesis Hemiplegia - Right/Left: Left Hemiplegia - caused by: Cerebral infarction     Time: 0905-0929 PT Time Calculation (min) (ACUTE ONLY): 24 min  Charges:    $Therapeutic Activity: 23-37 mins PT General Charges $$ ACUTE PT VISIT: 1 Visit                    Hendricks Limes, PT 04/05/23, 11:31 AM

## 2023-04-05 NOTE — Progress Notes (Signed)
Occupational Therapy Treatment Patient Details Name: Thomas [name] MRN: [id number] DOB: Feb 27, 1931 Today's Date: 04/05/2023   History of present illness Pt is a 89 y.o. male presenting to hospital 03/11/23 with generalized weakness, dizziness, and being off balance; came home from [facility] rehab 7 days prior after multifocal small vessel infarcts.  Imaging showing acute infarct of R anterior thalamus.  Pt admitted with acute CVA.  S/p 03/19/23 R CEA (PT/OT orders discontinued 03/19/23). Pt also noted with hematuria and acute urinary retention.  New PT/OT orders received 03/29/23.  PMH includes recent stroke 3 weeks ago, htn, HLD.   OT comments  Pt received semi-reclined in bed, leaning to L significantly with trunk. Appearing alert; willing to work with OT on grooming. See flowsheet below for further details of session. Left semi-reclined in bed, two pillows positioned on pt's L side trunk to prevent further lean, with all needs in reach.  Patient will benefit from continued OT while in acute care.       If plan is discharge home, recommend the following:  A lot of help with walking and/or transfers;A lot of help with bathing/dressing/bathroom;Supervision due to cognitive status;Direct supervision/assist for financial management;Assistance with cooking/housework;Assist for transportation;Help with stairs or ramp for entrance;Direct supervision/assist for medications management;Assistance with feeding   Equipment Recommendations  Other (comment) (defer to next venue)    Recommendations for Other Services      Precautions / Restrictions Precautions Precautions: Fall Precaution Comments: L lat tibial wound Restrictions Weight Bearing Restrictions: No       Mobility Bed Mobility               General bed mobility comments: Pt remained bed level today; however when OT arrived pt was semi-reclined and leaning significantly to the L (with head touching side bed rail); pt able to reposition  with cues; OT placed pillow on L side, but pt requiring 2 pillows to maintain upright positioning, as he would then lean to the L again within a few minutes.    Transfers                   General transfer comment: transfer not attempted today     Balance                                           ADL either performed or assessed with clinical judgement   ADL Overall ADL's : Needs assistance/impaired     Grooming: Bed level;Supervision/safety;Set up;Wash/dry face;Oral care;Brushing hair (HOB raised) Grooming Details (indicate cue type and reason): assistance for opening toothpaste container; assist holding basin for spitting.                                    Extremity/Trunk Assessment Upper Extremity Assessment Upper Extremity Assessment: Generalized weakness (decreased ability to open toothpaste container)   Lower Extremity Assessment Lower Extremity Assessment: Defer to PT evaluation        Vision       Perception     Praxis      Cognition Arousal: Alert Behavior During Therapy: [facility] for tasks assessed/performed Overall Cognitive Status: No family/caregiver present to determine baseline cognitive functioning  General Comments: Pt follows one-step commands consistently. Is very sweet. Cried about his son at one point; easily redirectable; very thankful for OT visit.        Exercises      Shoulder Instructions       General Comments Pt on room air today    Pertinent Vitals/ Pain       Pain Assessment Pain Assessment: No/denies pain  Home Living                                          Prior Functioning/Environment              Frequency  Min 1X/week        Progress Toward Goals  OT Goals(current goals can now be found in the care plan section)  Progress towards OT goals: Progressing toward goals  Acute Rehab OT Goals Patient Stated  Goal: Get better OT Goal Formulation: With patient Time For Goal Achievement: 04/12/23 Potential to Achieve Goals: Good ADL Goals Pt Will Perform Grooming: sitting;with supervision;with set-up Pt Will Perform Lower Body Dressing: with mod assist;sit to/from stand Pt Will Transfer to Toilet: with mod assist;bedside commode;ambulating Pt Will Perform Toileting - Clothing Manipulation and hygiene: with min assist;sit to/from stand Additional ADL Goal #1: Patient sitting on edge of bed participating in light ADLs and upper body activity maintaining balance for 10 minutes with contact-guard  Plan      Co-evaluation                 AM-PAC OT "6 Clicks" Daily Activity     Outcome Measure   Help from another person eating meals?: A Little Help from another person taking care of personal grooming?: A Little Help from another person toileting, which includes using toliet, bedpan, or urinal?: A Lot Help from another person bathing (including washing, rinsing, drying)?: A Lot Help from another person to put on and taking off regular upper body clothing?: A Little Help from another person to put on and taking off regular lower body clothing?: A Lot 6 Click Score: 15    End of Session    OT Visit Diagnosis: Unsteadiness on feet (R26.81);Other abnormalities of gait and mobility (R26.89);Repeated falls (R29.6);Muscle weakness (generalized) (M62.81);History of falling (Z91.81)   Activity Tolerance Patient tolerated treatment well   Patient Left in bed;with call bell/phone within reach;with bed alarm set   Nurse Communication Mobility status        Time: 1610-9604 OT Time Calculation (min): 13 min  Charges: OT General Charges $OT Visit: 1 Visit OT Treatments $Self Care/Home Management : 8-22 mins  Linward Foster, MS, OTR/L  Alvester Morin 04/05/2023, 3:58 PM

## 2023-04-06 DIAGNOSIS — I639 Cerebral infarction, unspecified: Secondary | ICD-10-CM | POA: Diagnosis not present

## 2023-04-06 MED ORDER — HYDROCORTISONE 1 % EX CREA
TOPICAL_CREAM | Freq: Two times a day (BID) | CUTANEOUS | Status: DC
  Filled 2023-04-06: qty 28

## 2023-04-06 NOTE — Progress Notes (Signed)
PROGRESS NOTE    Thomas [name]  OAC:[id number] DOB: 1930-06-27 DOA: 03/11/2023 PCP: Barbette Reichmann, MD  119A/119A-AA  LOS: 21 days   Brief hospital course:  Thomas [name] is a 89 y.o. male with medical history significant of hypertension, hyperlipidemia, recent CVA 3 weeks ago treated at [facility] was brought to the ED due to generalized weakness and dizziness being off balance x 1 day.    Patient came home from [facility] rehab 7 days PTA after multifocal small vessel infarcts.  Was doing well at home.  Ambulating on his own with good functional status, able to perform basic ADLs.  1 night before presentation to [facility] family states the patient did not want to eat dinner and started complaining of dizziness.  The morning of presentation patient was having trouble with coordination unable to raise himself out of bed.     On presentation to the ED MRI reveals new acute CVA.    Patient seen in consultation by neurology who ordered CTA to evaluate carotid anatomy. 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  Vascular surgery consulted and performed right carotid endarterectomy on 03/19/23.  Post-op, pt was more confused and pulled out his Foley causing penile trauma hematuria with subsequent acute urinary retention.  Assessment & Plan:   Acute CVA Right carotid stenosis S/p right carotid endarterectomy on 11/8 Patient presents 3 weeks after previous hospitalization for stroke and discharged from [facility].  Now with evidence of new acute CVA on imaging.  Pt was compliance with dual antiplatelet therapy and antihyperlipidemic therapy at home. --LDL 87.  switched home statin to lipitor 40 Plan: --cont ASA --cont plavix (resumed on 11/22) --cont Lipitor 40   Hematuria 2/2 penile trauma --per nursing, pt was confused post-op and pulled out his Foley.   --urology consulted --monitor for hematuria after plavix resumed  Acute  urinary retention --PVR 699 on 11/11 --urology consulted, Foley inserted --cont Flomax (new) --f/u with [facility] for a voiding trial in a week or two   AKI --Cr up to 2.24 this morning.  Baseline around 1. --improved with IVF --encourage oral hydration  Essential hypertension --BP has been low during hospitalization.   Hyperlipidemia --LDL 87.  switched home statin to lipitor 40 --cont statin   GERD --cont PPI  Hospital delirium --more confused since the surgery. --supportive care  Bilious vomiting --started morning of 11/10, resolved by next day --KUB showed mod stool burden in rectum --Ensure BM's.   DVT prophylaxis: SCD/Compression stockings Code Status: DNR  Family Communication:  Level of care: Med-Surg Dispo:   The patient is from: home Anticipated d/c is to: SNF rehab Anticipated d/c date is: whenever bed available   Subjective and Interval History:  Again, pt was seen while feeding himself.  No complaint.     Objective: Vitals:   04/06/23 0810 04/06/23 0811 04/06/23 1601 04/06/23 2006  BP: 129/74  (!) 110/40 129/79  Pulse: 68 66 72 72  Resp: 18   18  Temp: 98.1 F (36.7 C)  (!) 97.5 F (36.4 C) 98.1 F (36.7 C)  TempSrc:      SpO2: 91% 95% 95% 97%  Height:        Intake/Output Summary (Last 24 hours) at 04/06/2023 2039 Last data filed at 04/06/2023 1841 Gross per 24 hour  Intake 120 ml  Output 200 ml  Net -80 ml     There were no vitals filed for  this visit.  Examination:   Constitutional: NAD, alert, oriented to person and place HEENT: conjunctivae and lids normal, EOMI CV: No cyanosis.   RESP: normal respiratory effort, on RA Neuro: II - XII grossly intact.   Psych: Normal mood and affect.   Foley present, no hematuria   Data Reviewed: I have personally reviewed labs and imaging studies  Time spent: 25 minutes  [name], MD [facility] If 7PM-7AM, please contact night-coverage 04/06/2023, 8:39 PM

## 2023-04-06 NOTE — Plan of Care (Signed)
  Problem: Education: Goal: Knowledge of disease or condition will improve Outcome: Progressing Goal: Knowledge of secondary prevention will improve (MUST DOCUMENT ALL) Outcome: Progressing Goal: Knowledge of patient specific risk factors will improve Loraine Leriche N/A or DELETE if not current risk factor) Outcome: Progressing   Problem: Ischemic Stroke/TIA Tissue Perfusion: Goal: Complications of ischemic stroke/TIA will be minimized Outcome: Progressing   Problem: Coping: Goal: Will verbalize positive feelings about self Outcome: Progressing Goal: Will identify appropriate support needs Outcome: Progressing   Problem: Self-Care: Goal: Ability to participate in self-care as condition permits will improve Outcome: Progressing Goal: Verbalization of feelings and concerns over difficulty with self-care will improve Outcome: Progressing Goal: Ability to communicate needs accurately will improve Outcome: Progressing   Problem: Nutrition: Goal: Risk of aspiration will decrease Outcome: Progressing Goal: Dietary intake will improve Outcome: Progressing   Problem: Education: Goal: Knowledge of General Education information will improve Description: Including pain rating scale, medication(s)/side effects and non-pharmacologic comfort measures Outcome: Progressing   Problem: Health Behavior/Discharge Planning: Goal: Ability to manage health-related needs will improve Outcome: Progressing   Problem: Clinical Measurements: Goal: Ability to maintain clinical measurements within normal limits will improve Outcome: Progressing Goal: Will remain free from infection Outcome: Progressing Goal: Diagnostic test results will improve Outcome: Progressing Goal: Respiratory complications will improve Outcome: Progressing Goal: Cardiovascular complication will be avoided Outcome: Progressing   Problem: Activity: Goal: Risk for activity intolerance will decrease Outcome: Progressing    Problem: Nutrition: Goal: Adequate nutrition will be maintained Outcome: Progressing   Problem: Coping: Goal: Level of anxiety will decrease Outcome: Progressing   Problem: Elimination: Goal: Will not experience complications related to bowel motility Outcome: Progressing Goal: Will not experience complications related to urinary retention Outcome: Progressing   Problem: Pain Management: Goal: General experience of comfort will improve Outcome: Progressing   Problem: Safety: Goal: Ability to remain free from injury will improve Outcome: Progressing   Problem: Skin Integrity: Goal: Risk for impaired skin integrity will decrease Outcome: Progressing   Problem: Education: Goal: Knowledge of discharge needs will improve Outcome: Progressing

## 2023-04-06 NOTE — Progress Notes (Signed)
Physical Therapy Treatment Patient Details Name: Thomas [name] MRN: [id number] DOB: Sep 29, 1930 Today's Date: 04/06/2023   History of Present Illness Pt is a 89 y.o. male presenting to hospital 03/11/23 with generalized weakness, dizziness, and being off balance; came home from [facility] rehab 7 days prior after multifocal small vessel infarcts.  Imaging showing acute infarct of R anterior thalamus.  Pt admitted with acute CVA.  S/p 03/19/23 R CEA (PT/OT orders discontinued 03/19/23). Pt also noted with hematuria and acute urinary retention.  New PT/OT orders received 03/29/23.  PMH includes recent stroke 3 weeks ago, htn, HLD.    PT Comments  Pt resting in bed upon PT arrival; agreeable to therapy; no c/o pain during session.  Pt mod assist semi-supine to sitting EOB; max assist stand pivot transfer bed to recliner; max assist to stand from recliner up to RW; and max assist to ambulate 10 feet with RW use (close chair follow for safety)--see below for gait details.  Will continue to focus on strengthening, balance, and progressive functional mobility per pt tolerance.    If plan is discharge home, recommend the following: Two people to help with walking and/or transfers;Two people to help with bathing/dressing/bathroom;Assistance with cooking/housework;Assistance with feeding;Direct supervision/assist for medications management;Assist for transportation;Supervision due to cognitive status;Help with stairs or ramp for entrance   Can travel by private vehicle     No  Equipment Recommendations  Other (comment) (TBD at next venue of care)    Recommendations for Other Services       Precautions / Restrictions Precautions Precautions: Fall Restrictions Weight Bearing Restrictions: No     Mobility  Bed Mobility Overal bed mobility: Needs Assistance Bed Mobility: Supine to Sit     Supine to sit: HOB elevated, Used rails, Mod assist     General bed mobility comments: assist for B LE's and trunk;  vc's for technique    Transfers Overall transfer level: Needs assistance Equipment used: Rolling walker (2 wheels) Transfers: Sit to/from Stand, Bed to chair/wheelchair/BSC Sit to Stand: Max assist Stand pivot transfers: Max assist         General transfer comment: max assist stand pivot transfer bed to recliner; max assist to stand from recliner up to RW (and control descent sitting post ambulation); vc's for UE/LE positioning and assist to block foot when first standing    Ambulation/Gait Ambulation/Gait assistance: Max assist Gait Distance (Feet): 10 Feet Assistive device: Rolling walker (2 wheels) Gait Pattern/deviations: Decreased step length - right, Decreased step length - left, Decreased stance time - left Gait velocity: decreased     General Gait Details: assist to steady; vc's for upright posture; assist for weightshifting to take steps at times; vc's for longer steps; increased effort/time to take steps (more difficult to take steps with L LE compared to R LE); chair follow for safety   Stairs             Wheelchair Mobility     Tilt Bed    Modified Rankin (Stroke Patients Only)       Balance Overall balance assessment: Needs assistance Sitting-balance support: No upper extremity supported, Feet supported Sitting balance-[name]: Good Sitting balance - Comments: steady reaching within BOS; mild L lateral lean noted Postural control: Left lateral lean Standing balance support: Bilateral upper extremity supported, Reliant on assistive device for balance Standing balance-[name]: Poor Standing balance comment: L lateral lean noted in standing; vc's and assist for upright posture  Cognition Arousal: Alert Behavior During Therapy: WFL for tasks assessed/performed Overall Cognitive Status: No family/caregiver present to determine baseline cognitive functioning Area of Impairment: Orientation,  Safety/judgement, Problem solving                 Orientation Level: Disoriented to, Time, Situation Current Attention Level: Sustained Memory: Decreased short-term memory Following Commands: Follows one step commands with increased time Safety/Judgement: Decreased awareness of deficits Awareness: Emergent Problem Solving: Slow processing, Requires verbal cues, Requires tactile cues, Decreased initiation, Difficulty sequencing          Exercises      General Comments  Pt agreeable to PT session.      Pertinent Vitals/Pain Pain Assessment Pain Assessment: No/denies pain Pain Intervention(s): Limited activity within patient's tolerance, Monitored during session, Repositioned Vitals (HR and SpO2 on room air) stable and WFL throughout treatment session.    Home Living                          Prior Function            PT Goals (current goals can now be found in the care plan section) Acute Rehab PT Goals Patient Stated Goal: to improve strength and mobility PT Goal Formulation: With patient Time For Goal Achievement: 04/12/23 Potential to Achieve Goals: Fair Progress towards PT goals: Progressing toward goals    Frequency    Min 1X/week      PT Plan      Co-evaluation              AM-PAC PT "6 Clicks" Mobility   Outcome Measure  Help needed turning from your back to your side while in a flat bed without using bedrails?: A Lot Help needed moving from lying on your back to sitting on the side of a flat bed without using bedrails?: A Lot Help needed moving to and from a bed to a chair (including a wheelchair)?: A Lot Help needed standing up from a chair using your arms (e.g., wheelchair or bedside chair)?: A Lot Help needed to walk in hospital room?: A Lot Help needed climbing 3-5 steps with a railing? : Total 6 Click Score: 11    End of Session Equipment Utilized During Treatment: Gait belt Activity Tolerance: Patient tolerated  treatment well Patient left: in chair;with call bell/phone within reach;with chair alarm set Nurse Communication: Mobility status;Precautions (via white board) PT Visit Diagnosis: Unsteadiness on feet (R26.81);Other abnormalities of gait and mobility (R26.89);Repeated falls (R29.6);Muscle weakness (generalized) (M62.81);Hemiplegia and hemiparesis Hemiplegia - Right/Left: Left Hemiplegia - caused by: Cerebral infarction     Time: 2130-8657 PT Time Calculation (min) (ACUTE ONLY): 27 min  Charges:    $Gait Training: 8-22 mins $Therapeutic Activity: 8-22 mins PT General Charges $$ ACUTE PT VISIT: 1 Visit                     Hendricks Limes, PT 04/06/23, 3:33 PM

## 2023-04-06 NOTE — Plan of Care (Signed)
  Problem: Coping: Goal: Will identify appropriate support needs Outcome: Progressing   Problem: Health Behavior/Discharge Planning: Goal: Goals will be collaboratively established with patient/family Outcome: Progressing   Problem: Clinical Measurements: Goal: Respiratory complications will improve Outcome: Progressing Goal: Cardiovascular complication will be avoided Outcome: Progressing   Problem: Elimination: Goal: Will not experience complications related to bowel motility Outcome: Progressing   Problem: Education: Goal: Knowledge of disease or condition will improve Outcome: Not Progressing   Problem: Self-Care: Goal: Ability to communicate needs accurately will improve Outcome: Not Progressing   Problem: Education: Goal: Knowledge of General Education information will improve Description: Including pain rating scale, medication(s)/side effects and non-pharmacologic comfort measures Outcome: Not Progressing   Problem: Clinical Measurements: Goal: Will remain free from infection Outcome: Not Progressing   Problem: Elimination: Goal: Will not experience complications related to urinary retention Outcome: Not Progressing

## 2023-04-07 DIAGNOSIS — I639 Cerebral infarction, unspecified: Secondary | ICD-10-CM | POA: Diagnosis not present

## 2023-04-07 MED ORDER — STERILE WATER FOR INJECTION IJ SOLN
INTRAMUSCULAR | Status: AC
  Filled 2023-04-07: qty 20

## 2023-04-07 NOTE — Progress Notes (Signed)
Occupational Therapy Treatment Patient Details Name: Thomas [name] MRN: [id number] DOB: Nov 08, 1930 Today's Date: 04/07/2023   History of present illness Pt is a 89 y.o. male presenting to hospital 03/11/23 with generalized weakness, dizziness, and being off balance; came home from [facility] rehab 7 days prior after multifocal small vessel infarcts.  Imaging showing acute infarct of R anterior thalamus.  Pt admitted with acute CVA.  S/p 03/19/23 R CEA (PT/OT orders discontinued 03/19/23). Pt also noted with hematuria and acute urinary retention.  New PT/OT orders received 03/29/23.  PMH includes recent stroke 3 weeks ago, htn, HLD.   OT comments  Pt received semi-reclined in bed, leaning to the R. Appearing alert; willing to work with OT on t/f to chair. T/f MAX A x2 with RW. See flowsheet below for further details of session. Left seated in recliner with all needs in reach.        If plan is discharge home, recommend the following:  Two people to help with walking and/or transfers;A lot of help with bathing/dressing/bathroom;Assistance with cooking/housework;Assist for transportation;Help with stairs or ramp for entrance;Supervision due to cognitive status;Direct supervision/assist for medications management;Direct supervision/assist for financial management   Equipment Recommendations  Other (comment) (defer to next venue of care)    Recommendations for Other Services      Precautions / Restrictions Precautions Precautions: Fall Precaution Comments: L lat tibial wound Restrictions Weight Bearing Restrictions: No       Mobility Bed Mobility Overal bed mobility: Needs Assistance Bed Mobility: Supine to Sit     Supine to sit: Min assist, HOB elevated, Used rails     General bed mobility comments: slow and with cues, but pt able to do most of bed mobility    Transfers Overall transfer level: Needs assistance Equipment used: Rolling walker (2 wheels) Transfers: Sit to/from Stand, Bed  to chair/wheelchair/BSC Sit to Stand: Max assist     Step pivot transfers: Max assist, +2 physical assistance     General transfer comment: lots of cues; pt fatiguing quickly.     Balance Overall balance assessment: Needs assistance Sitting-balance support: No upper extremity supported, Feet supported Sitting balance-[name]: Good     Standing balance support: Bilateral upper extremity supported, Reliant on assistive device for balance Standing balance-[name]: Poor Standing balance comment: posterior lean noted                           ADL either performed or assessed with clinical judgement   ADL Overall ADL's : Needs assistance/impaired Eating/Feeding: Sitting;Set up Eating/Feeding Details (indicate cue type and reason): Pt needing close supervision for using cup (difficulty grasping cup), but did not drop it. More easily able to hold small cup of applesauce in L hand and use R hand with spoon for self-feeding. Pt was noted to be chewing on sausage from breakfast consistently from beginning to end of session, so water and applesauce provided to try to encourage swallow. Grooming: Brushing hair;Sitting;Minimal assistance Grooming Details (indicate cue type and reason): help for styling and for the back of head                             Functional mobility during ADLs: Maximal assistance;+2 for physical assistance;Rolling walker (2 wheels) General ADL Comments: MAX A x1 while static standing; MAX A x2 to turn to chair.    Extremity/Trunk Assessment Upper Extremity Assessment Upper Extremity Assessment: Generalized  weakness   Lower Extremity Assessment Lower Extremity Assessment: Generalized weakness;Defer to PT evaluation        Vision       Perception     Praxis      Cognition Arousal: Alert Behavior During Therapy: [facility] for tasks assessed/performed Overall Cognitive Status: No family/caregiver present to determine baseline  cognitive functioning                                 General Comments: Follows one -step commands with extra time. Very pleasant, not saying much, agreeable. Not oriented.        Exercises      Shoulder Instructions       General Comments On room air. Pt positioned with pillow on R side 2/2 strong lean.    Pertinent Vitals/ Pain       Pain Assessment Pain Assessment: No/denies pain  Home Living                                          Prior Functioning/Environment              Frequency  Min 1X/week        Progress Toward Goals  OT Goals(current goals can now be found in the care plan section)  Progress towards OT goals: Progressing toward goals  Acute Rehab OT Goals Patient Stated Goal: Get better OT Goal Formulation: With patient Time For Goal Achievement: 04/12/23 Potential to Achieve Goals: Good ADL Goals Pt Will Perform Grooming: sitting;with supervision;with set-up Pt Will Perform Lower Body Dressing: with mod assist;sit to/from stand Pt Will Transfer to Toilet: with mod assist;bedside commode;ambulating Pt Will Perform Toileting - Clothing Manipulation and hygiene: with min assist;sit to/from stand Additional ADL Goal #1: Patient sitting on edge of bed participating in light ADLs and upper body activity maintaining balance for 10 minutes with contact-guard  Plan      Co-evaluation    PT/OT/SLP Co-Evaluation/Treatment: Yes Reason for Co-Treatment: For patient/therapist safety;To address functional/ADL transfers   OT goals addressed during session: ADL's and self-care;Strengthening/ROM      AM-PAC OT "6 Clicks" Daily Activity     Outcome Measure   Help from another person eating meals?: A Little Help from another person taking care of personal grooming?: A Little Help from another person toileting, which includes using toliet, bedpan, or urinal?: Total Help from another person bathing (including washing,  rinsing, drying)?: A Lot Help from another person to put on and taking off regular upper body clothing?: A Little Help from another person to put on and taking off regular lower body clothing?: Total 6 Click Score: 13    End of Session Equipment Utilized During Treatment: Gait belt;Rolling walker (2 wheels)  OT Visit Diagnosis: Unsteadiness on feet (R26.81);Other abnormalities of gait and mobility (R26.89);Repeated falls (R29.6);Muscle weakness (generalized) (M62.81);History of falling (Z91.81)   Activity Tolerance Patient tolerated treatment well   Patient Left in chair;with call bell/phone within reach;with chair alarm set;with SCD's reapplied   Nurse Communication Mobility status        Time: 1610-9604 OT Time Calculation (min): 27 min  Charges: OT General Charges $OT Visit: 1 Visit OT Treatments $Self Care/Home Management : 8-22 mins  Linward Foster, MS, OTR/L  Alvester Morin 04/07/2023, 11:18 AM

## 2023-04-07 NOTE — Progress Notes (Signed)
PROGRESS NOTE    Thomas [name]  ZOX:[id number] DOB: [birth year] DOA: 03/11/2023 PCP: Barbette Reichmann, MD  [id number]-[id number]  LOS: 22 days   Brief hospital course:  Thomas [name] is a 89 y.o. male with medical history significant of hypertension, hyperlipidemia, recent CVA 3 weeks ago treated at [facility] was brought to the ED due to generalized weakness and dizziness being off balance x 1 day.    Patient came home from [facility] rehab 7 days PTA after multifocal small vessel infarcts.  Was doing well at home.  Ambulating on his own with good functional status, able to perform basic ADLs.  1 night before presentation to [facility] family states the patient did not want to eat dinner and started complaining of dizziness.  The morning of presentation patient was having trouble with coordination unable to raise himself out of bed.     On presentation to the ED MRI reveals new acute CVA.    Patient seen in consultation by neurology who ordered CTA to evaluate carotid anatomy. 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  Vascular surgery consulted and performed right carotid endarterectomy on 03/19/23.  Post-op, pt was more confused and pulled out his Foley causing penile trauma hematuria with subsequent acute urinary retention.  Assessment & Plan:   Acute CVA Right carotid stenosis S/p right carotid endarterectomy on 11/8 Patient presents 3 weeks after previous hospitalization for stroke and discharged from [facility].  Now with evidence of new acute CVA on imaging.  Pt was compliance with dual antiplatelet therapy and antihyperlipidemic therapy at home. --LDL 87.  switched home statin to lipitor 40 Plan: --cont ASA --cont plavix (resumed on 11/22) --cont Lipitor 40   Hematuria 2/2 penile trauma --per nursing, pt was confused post-op and pulled out his Foley.   --urology consulted --monitor for hematuria after plavix resumed  Acute  urinary retention --PVR 699 on 11/11 --urology consulted, Foley inserted --cont Flomax (new) --f/u with [facility] for a voiding trial in a week or two. Given patient has now been here for those two weeks, reasonable to consider attempting a voiding trial here  AKI --Cr up to 2.24 now returned more or less to baseline w/ hydration --encourage oral hydration -- monitor periorically  Essential hypertension --BP has been low during hospitalization.   Hyperlipidemia --LDL 87.  switched home statin to lipitor 40 --cont statin   GERD --cont PPI  Hospital delirium --more confused since the surgery. --supportive care  Bilious vomiting --started morning of 11/10, resolved by next day --KUB showed mod stool burden in rectum  Loose stools Noted today by rn, is on stool softeners - d/c stool softeners and monitor   DVT prophylaxis: SCD/Compression stockings Code Status: DNR  Family Communication:  Level of care: Med-Surg Dispo:   The patient is from: home Anticipated d/c is to: SNF rehab Anticipated d/c date is: whenever bed available   Subjective and Interval History:  Again, pt was seen while feeding himself.  No complaint.  No pain. Loose stools today per rn   Objective: Vitals:   04/06/23 1601 04/06/23 2006 04/07/23 0437 04/07/23 0735  BP: (!) 110/40 129/79 113/67 126/71  Pulse: 72 72 61 (!) 57  Resp:  18 17 16   Temp: (!) 97.5 F (36.4 C) 98.1 F (36.7 C) 97.8 F (36.6 C) (!) 97.5 F (36.4 C)  TempSrc:      SpO2: 95% 97% 97% 97%  Height:  Intake/Output Summary (Last 24 hours) at 04/07/2023 1517 Last data filed at 04/07/2023 0500 Gross per 24 hour  Intake 240 ml  Output 600 ml  Net -360 ml     There were no vitals filed for this visit.  Examination:   Constitutional: NAD, alert, oriented to person and place HEENT: conjunctivae and lids normal, EOMI CV: No cyanosis.   RESP: normal respiratory effort, on RA Neuro: moving all  4 Psych: Normal mood and affect.   Foley present, no hematuria   Data Reviewed: I have personally reviewed labs and imaging studies  Time spent: 25 minutes  Silvano Bilis, MD [facility] If 7PM-7AM, please contact night-coverage 04/07/2023, 3:17 PM

## 2023-04-07 NOTE — Plan of Care (Signed)
  Problem: Education: Goal: Knowledge of disease or condition will improve Outcome: Progressing Goal: Knowledge of secondary prevention will improve (MUST DOCUMENT ALL) Outcome: Progressing Goal: Knowledge of patient specific risk factors will improve Loraine Leriche N/A or DELETE if not current risk factor) Outcome: Progressing   Problem: Ischemic Stroke/TIA Tissue Perfusion: Goal: Complications of ischemic stroke/TIA will be minimized Outcome: Progressing   Problem: Coping: Goal: Will identify appropriate support needs Outcome: Progressing   Problem: Health Behavior/Discharge Planning: Goal: Ability to manage health-related needs will improve Outcome: Progressing   Problem: Self-Care: Goal: Ability to participate in self-care as condition permits will improve Outcome: Progressing Goal: Verbalization of feelings and concerns over difficulty with self-care will improve Outcome: Progressing   Problem: Education: Goal: Knowledge of General Education information will improve Description: Including pain rating scale, medication(s)/side effects and non-pharmacologic comfort measures Outcome: Progressing   Problem: Health Behavior/Discharge Planning: Goal: Ability to manage health-related needs will improve Outcome: Progressing   Problem: Clinical Measurements: Goal: Ability to maintain clinical measurements within normal limits will improve Outcome: Progressing Goal: Will remain free from infection Outcome: Progressing Goal: Diagnostic test results will improve Outcome: Progressing Goal: Respiratory complications will improve Outcome: Progressing Goal: Cardiovascular complication will be avoided Outcome: Progressing   Problem: Activity: Goal: Risk for activity intolerance will decrease Outcome: Progressing   Problem: Nutrition: Goal: Adequate nutrition will be maintained Outcome: Progressing

## 2023-04-07 NOTE — Progress Notes (Signed)
Physical Therapy Treatment Patient Details Name: Thomas [name] MRN: [id number] DOB: 04-Sep-1930 Today's Date: 04/07/2023   History of Present Illness Pt is a 89 y.o. male presenting to hospital 03/11/23 with generalized weakness, dizziness, and being off balance; came home from [facility] rehab 7 days prior after multifocal small vessel infarcts.  Imaging showing acute infarct of R anterior thalamus.  Pt admitted with acute CVA.  S/p 03/19/23 R CEA (PT/OT orders discontinued 03/19/23). Pt also noted with hematuria and acute urinary retention.  New PT/OT orders received 03/29/23.  PMH includes recent stroke 3 weeks ago, htn, HLD.    PT Comments  Patient supine in bed on arrival and agreeable to PT/OT treatment session. Patient required minA for bed mobility with increased time. MaxA+2 to stand from EOB with strong posterior lean with difficulty correcting despite cues. Required maxA+2 for transfer to chair with step by step cueing and assist for RW management and weight shifting. Current plan remains appropriate.     If plan is discharge home, recommend the following: Two people to help with walking and/or transfers;Two people to help with bathing/dressing/bathroom;Assistance with cooking/housework;Assistance with feeding;Direct supervision/assist for medications management;Assist for transportation;Supervision due to cognitive status;Help with stairs or ramp for entrance   Can travel by private vehicle     No  Equipment Recommendations  Other (comment) (TBD at next venue of care)    Recommendations for Other Services       Precautions / Restrictions Precautions Precautions: Fall Precaution Comments: L lat tibial wound Restrictions Weight Bearing Restrictions: No     Mobility  Bed Mobility Overal bed mobility: Needs Assistance Bed Mobility: Supine to Sit     Supine to sit: Min assist, HOB elevated, Used rails     General bed mobility comments: slow and with cues, but pt able to do most of  bed mobility    Transfers Overall transfer level: Needs assistance Equipment used: Rolling [name] (2 wheels) Transfers: Sit to/from Stand, Bed to chair/wheelchair/BSC Sit to Stand: Max assist   Step pivot transfers: Max assist, +2 physical assistance       General transfer comment: lots of cues; pt fatiguing quickly.    Ambulation/Gait                   Stairs             Wheelchair Mobility     Tilt Bed    Modified Rankin (Stroke Patients Only)       Balance Overall balance assessment: Needs assistance Sitting-balance support: No upper extremity supported, Feet supported Sitting balance-[name]: Good     Standing balance support: Bilateral upper extremity supported, Reliant on assistive device for balance Standing balance-[name]: Poor Standing balance comment: posterior lean noted                            Cognition Arousal: Alert Behavior During Therapy: WFL for tasks assessed/performed Overall Cognitive Status: No family/caregiver present to determine baseline cognitive functioning                                 General Comments: Follows one -step commands with extra time. Very pleasant, not saying much, agreeable. Not oriented.        Exercises      General Comments General comments (skin integrity, edema, etc.): On room air. Pt positioned with pillow on R side  2/2 strong lean.      Pertinent Vitals/Pain Pain Assessment Pain Assessment: No/denies pain    Home Living                          Prior Function            PT Goals (current goals can now be found in the care plan section) Acute Rehab PT Goals PT Goal Formulation: With patient Time For Goal Achievement: 04/12/23 Potential to Achieve Goals: Fair Progress towards PT goals: Progressing toward goals    Frequency    Min 1X/week      PT Plan      Co-evaluation PT/OT/SLP Co-Evaluation/Treatment: Yes Reason for  Co-Treatment: For patient/therapist safety;To address functional/ADL transfers PT goals addressed during session: Mobility/safety with mobility;Balance;Proper use of DME OT goals addressed during session: ADL's and self-care;Strengthening/ROM      AM-PAC PT "6 Clicks" Mobility   Outcome Measure  Help needed turning from your back to your side while in a flat bed without using bedrails?: A Lot Help needed moving from lying on your back to sitting on the side of a flat bed without using bedrails?: A Lot Help needed moving to and from a bed to a chair (including a wheelchair)?: A Lot Help needed standing up from a chair using your arms (e.g., wheelchair or bedside chair)?: Total Help needed to walk in hospital room?: Total Help needed climbing 3-5 steps with a railing? : Total 6 Click Score: 9    End of Session Equipment Utilized During Treatment: Gait belt Activity Tolerance: Patient tolerated treatment well Patient left: in chair;with call bell/phone within reach;with chair alarm set Nurse Communication: Mobility status PT Visit Diagnosis: Unsteadiness on feet (R26.81);Other abnormalities of gait and mobility (R26.89);Repeated falls (R29.6);Muscle weakness (generalized) (M62.81);Hemiplegia and hemiparesis     Time: 2956-2130 PT Time Calculation (min) (ACUTE ONLY): 23 min  Charges:    $Therapeutic Activity: 8-22 mins PT General Charges $$ ACUTE PT VISIT: 1 Visit                     Maylon Peppers, PT, DPT Physical Therapist - [facility]  [facility]    Nikky Duba A Bethsaida Siegenthaler 04/07/2023, 1:33 PM

## 2023-04-07 NOTE — Progress Notes (Signed)
Mobility Specialist - Progress Note   04/07/23 1631  Mobility  Activity Transferred from chair to bed  Level of Assistance Minimal assist, patient does 75% or more  Assistive Device Stedy  Activity Response Tolerated well  $Mobility charge 1 Mobility     Pt returned chair-bed with stedy. Max cueing for sequencing. MinA +2 to stand. Pt left in bed with alarm set, needs in reach.    Filiberto Pinks Mobility Specialist 04/07/23, 4:34 PM

## 2023-04-08 DIAGNOSIS — I639 Cerebral infarction, unspecified: Secondary | ICD-10-CM | POA: Diagnosis not present

## 2023-04-08 NOTE — Progress Notes (Signed)
PROGRESS NOTE    [name]  MWU:[id number] DOB: 30-Dec-1930 DOA: 03/11/2023 PCP: Barbette Reichmann, MD  119A/119A-AA  LOS: 23 days   Brief [name] course:  Thomas [name] is a 89 y.o. male with medical history significant of hypertension, hyperlipidemia, recent CVA 3 weeks ago treated at [name] was brought to the ED due to generalized weakness and dizziness being off balance x 1 day.    Patient came home from [name] re[facility] 7 days PTA after multifocal small vessel infarcts.  Was doing well at home.  Ambulating on his own with Thomas functional status, able to perform basic ADLs.  1 night before presentation to [name] family states the patient did not want to eat dinner and started complaining of dizziness.  The morning of presentation patient was having trouble with coordination unable to raise himself out of bed.     On presentation to the ED MRI reveals new acute CVA.    Patient seen in consultation by neurology who ordered CTA to evaluate carotid anatomy. 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  Vascular surgery consulted and performed right carotid endarterectomy on 03/19/23.  Post-op, pt was more confused and pulled out his Foley causing penile trauma hematuria with subsequent acute urinary retention.  Assessment & Plan:   Acute CVA Right carotid stenosis S/p right carotid endarterectomy on 11/8 Patient presents 3 weeks after previous hospitalization for stroke and discharged from [name].  Now with evidence of new acute CVA on imaging.  Pt was compliance with dual antiplatelet therapy and antihyperlipidemic therapy at home. --LDL 87.  switched home statin to lipitor 40 Plan: --cont ASA --cont plavix (resumed on 11/22) --cont Lipitor 40   Hematuria 2/2 penile trauma --per nursing, pt was confused post-op and pulled out his Foley.   --[name] consulted --monitor for hematuria after plavix resumed  Acute  urinary retention --PVR 699 on 11/11 --[name] consulted, Foley inserted --cont Flomax (new) --f/u with [facility] [name] for a voiding trial in a week or two  --consider inpatient voiding trial  AKI --Cr up to 2.24 this morning.  Baseline around 1. --improved with IVF --encourage oral hydration  Essential hypertension --BP has been low during hospitalization.   Hyperlipidemia --LDL 87.  switched home statin to lipitor 40 --cont statin   GERD --cont PPI  [name] delirium --more confused since the surgery. --supportive care  Bilious vomiting --started morning of 11/10, resolved by next day --KUB showed mod stool burden in rectum --Ensure BM's.   DVT prophylaxis: SCD/Compression stockings Code Status: DNR  Family Communication:  Level of care: Med-Surg Dispo:   The patient is from: home Anticipated d/c is to: SNF rehab Anticipated d/c date is: whenever bed available   Subjective and Interval History:  No new event.   Objective: Vitals:   04/07/23 1935 04/08/23 0533 04/08/23 0719 04/08/23 1437  BP: 137/64 (!) 152/93 132/84 (!) 115/56  Pulse: 69 62 64 76  Resp: 17 17 17 17   Temp: 98 F (36.7 C) 97.7 F (36.5 C) 97.8 F (36.6 C) (!) 97.4 F (36.3 C)  TempSrc:      SpO2: 95% 96% 94% 98%  Height:        Intake/Output Summary (Last 24 hours) at 04/08/2023 1847 Last data filed at 04/08/2023 0533 Gross per 24 hour  Intake --  Output 800 ml  Net -800 ml     There were no vitals filed for this visit.  Examination:   Constitutional: NAD CV: No cyanosis.   RESP: normal respiratory effort, on RA   Data Reviewed: I have personally reviewed labs and imaging studies  Time spent: 25 minutes  [name], MD [facility] If 7PM-7AM, please contact night-coverage 04/08/2023, 6:47 PM

## 2023-04-08 NOTE — Plan of Care (Signed)
  Problem: Education: Goal: Knowledge of disease or condition will improve Outcome: Progressing Goal: Knowledge of secondary prevention will improve (MUST DOCUMENT ALL) Outcome: Progressing Goal: Knowledge of patient specific risk factors will improve Loraine Leriche N/A or DELETE if not current risk factor) Outcome: Progressing   Problem: Ischemic Stroke/TIA Tissue Perfusion: Goal: Complications of ischemic stroke/TIA will be minimized Outcome: Progressing   Problem: Coping: Goal: Will verbalize positive feelings about self Outcome: Progressing Goal: Will identify appropriate support needs Outcome: Progressing   Problem: Health Behavior/Discharge Planning: Goal: Ability to manage health-related needs will improve Outcome: Progressing Goal: Goals will be collaboratively established with patient/family Outcome: Progressing   Problem: Self-Care: Goal: Ability to participate in self-care as condition permits will improve Outcome: Progressing Goal: Verbalization of feelings and concerns over difficulty with self-care will improve Outcome: Progressing Goal: Ability to communicate needs accurately will improve Outcome: Progressing   Problem: Nutrition: Goal: Risk of aspiration will decrease Outcome: Progressing Goal: Dietary intake will improve Outcome: Progressing   Problem: Education: Goal: Knowledge of General Education information will improve Description: Including pain rating scale, medication(s)/side effects and non-pharmacologic comfort measures Outcome: Progressing   Problem: Health Behavior/Discharge Planning: Goal: Ability to manage health-related needs will improve Outcome: Progressing   Problem: Clinical Measurements: Goal: Ability to maintain clinical measurements within normal limits will improve Outcome: Progressing Goal: Will remain free from infection Outcome: Progressing Goal: Diagnostic test results will improve Outcome: Progressing Goal: Respiratory  complications will improve Outcome: Progressing Goal: Cardiovascular complication will be avoided Outcome: Progressing   Problem: Activity: Goal: Risk for activity intolerance will decrease Outcome: Progressing   Problem: Nutrition: Goal: Adequate nutrition will be maintained Outcome: Progressing   Problem: Coping: Goal: Level of anxiety will decrease Outcome: Progressing   Problem: Elimination: Goal: Will not experience complications related to bowel motility Outcome: Progressing Goal: Will not experience complications related to urinary retention Outcome: Progressing   Problem: Pain Management: Goal: General experience of comfort will improve Outcome: Progressing   Problem: Safety: Goal: Ability to remain free from injury will improve Outcome: Progressing   Problem: Skin Integrity: Goal: Risk for impaired skin integrity will decrease Outcome: Progressing   Problem: Education: Goal: Knowledge of discharge needs will improve Outcome: Progressing   Problem: Clinical Measurements: Goal: Postoperative complications will be avoided or minimized Outcome: Progressing   Problem: Respiratory: Goal: Ability to achieve and maintain a regular respiratory rate will improve Outcome: Progressing   Problem: Skin Integrity: Goal: Demonstration of wound healing without infection will improve Outcome: Progressing

## 2023-04-09 DIAGNOSIS — I639 Cerebral infarction, unspecified: Secondary | ICD-10-CM | POA: Diagnosis not present

## 2023-04-09 NOTE — TOC Progression Note (Signed)
Transition of Care [facility]) - Progression Note    Patient Details  Name: Thomas [name] MRN: [id number] Date of Birth: 1931-04-15  Transition of Care [facility]) CM/SW Contact  Allena Katz, LCSW Phone Number: 04/09/2023, 2:57 PM  Clinical Narrative:   Authorization per pending per tiffany at [facility].    Expected Discharge Plan: Skilled Nursing Facility Barriers to Discharge: Continued Medical Work up  Expected Discharge Plan and Services                                               Social Determinants of Health (SDOH) Interventions SDOH Screenings   Food Insecurity: No Food Insecurity (03/12/2023)  Housing: Low Risk  (03/12/2023)  Transportation Needs: No Transportation Needs (03/12/2023)  Utilities: Not At Risk (03/12/2023)  Financial Resource Strain: Low Risk  (10/12/2022)   Received from [facility]  Tobacco Use: Low Risk  (03/19/2023)  Health Literacy: High Risk (03/12/2023)   Received from [facility]    Readmission Risk Interventions     No data to display

## 2023-04-09 NOTE — Progress Notes (Signed)
PROGRESS NOTE    Thomas [name]  [id number] DOB: May 26, 1930 DOA: 03/11/2023 PCP: Barbette Reichmann, MD  119A/119A-AA  LOS: 24 days   Brief hospital course:  Thomas [name] is a 89 y.o. male with medical history significant of hypertension, hyperlipidemia, recent CVA 3 weeks ago treated at [facility] was brought to the ED due to generalized weakness and dizziness being off balance x 1 day.    Patient came home from [facility] rehab 7 days PTA after multifocal small vessel infarcts.  Was doing well at home.  Ambulating on his own with good functional status, able to perform basic ADLs.  1 night before presentation to [facility] family states the patient did not want to eat dinner and started complaining of dizziness.  The morning of presentation patient was having trouble with coordination unable to raise himself out of bed.     On presentation to the ED MRI reveals new acute CVA.    Patient seen in consultation by neurology who ordered CTA to evaluate carotid anatomy. 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  Vascular surgery consulted and performed right carotid endarterectomy on 03/19/23.  Post-op, pt was more confused and pulled out his Foley causing penile trauma hematuria with subsequent acute urinary retention.  Assessment & Plan:   Acute CVA Right carotid stenosis S/p right carotid endarterectomy on 11/8 Patient presents 3 weeks after previous hospitalization for stroke and discharged from [facility].  Now with evidence of new acute CVA on imaging.  Pt was compliance with dual antiplatelet therapy and antihyperlipidemic therapy at home. --LDL 87.  switched home statin to lipitor 40 Plan: --cont ASA --cont plavix (resumed on 11/22) --cont Lipitor 40   Hematuria 2/2 penile trauma --per nursing, pt was confused post-op and pulled out his Foley.   --urology consulted --monitor for hematuria after plavix resumed  Acute  urinary retention --PVR 699 on 11/11 --urology consulted, Foley inserted --cont Flomax (new) --voiding trial today, with bladder scan ordered --if failed voiding trial f/u with [facility]   AKI --Cr up to 2.24 this morning.  Baseline around 1. --improved with IVF --oral hydration now  Essential hypertension --BP has been low during hospitalization.   Hyperlipidemia --LDL 87.  switched home statin to lipitor 40 --cont statin   GERD --cont PPI  Hospital delirium --more confused since the surgery. --supportive care  Bilious vomiting --started morning of 11/10, resolved by next day --KUB showed mod stool burden in rectum --Ensure BM's.   DVT prophylaxis: SCD/Compression stockings Code Status: DNR  Family Communication:  Level of care: Med-Surg Dispo:   The patient is from: home Anticipated d/c is to: SNF rehab Anticipated d/c date is: whenever bed available   Subjective and Interval History:  No new complaint today.  Voiding trial today.   Objective: Vitals:   04/08/23 1949 04/09/23 0438 04/09/23 0804 04/09/23 1618  BP: 111/72 130/66 127/74 (!) 140/73  Pulse: 64 63 60 70  Resp:  18 18 15   Temp: 98.3 F (36.8 C) 97.8 F (36.6 C) 97.7 F (36.5 C) 98.2 F (36.8 C)  TempSrc:    Oral  SpO2: 96% 97% 96% 99%  Height:        Intake/Output Summary (Last 24 hours) at 04/09/2023 1945 Last data filed at 04/09/2023 1100 Gross per 24 hour  Intake --  Output 875 ml  Net -875 ml     There were no vitals filed for this  visit.  Examination:   Constitutional: NAD CV: No cyanosis.   RESP: normal respiratory effort, on RA Foley present with no hematuria   Data Reviewed: I have personally reviewed labs and imaging studies  Time spent: 35 minutes  [name], MD [facility] If 7PM-7AM, please contact night-coverage 04/09/2023, 7:45 PM

## 2023-04-09 NOTE — Progress Notes (Signed)
Increased time to complete session due to pt requiring cues to stay on task. Pt remains confused however very cooperative. Continues to require heavy assist for transfers and mobility, safer with +2 due to poor balance in standing and difficulty following single step commands at times.   04/09/23 1400  PT Visit Information  Assistance Needed +2  History of Present Illness Pt is a 89 y.o. male presenting to hospital 03/11/23 with generalized weakness, dizziness, and being off balance; came home from [facility] rehab 7 days prior after multifocal small vessel infarcts.  Imaging showing acute infarct of R anterior thalamus.  Pt admitted with acute CVA.  S/p 03/19/23 R CEA (PT/OT orders discontinued 03/19/23). Pt also noted with hematuria and acute urinary retention.  New PT/OT orders received 03/29/23.  PMH includes recent stroke 3 weeks ago, htn, HLD.  Subjective Data  Subjective Pt verbose t/o session, at times tangential  Patient Stated Goal to improve strength and mobility  Precautions  Precautions Fall  Precaution Comments L lat tibial wound  Restrictions  Weight Bearing Restrictions No  Pain Assessment  Pain Assessment No/denies pain  Cognition  Arousal Alert  Behavior During Therapy WFL for tasks assessed/performed  Overall Cognitive Status No family/caregiver present to determine baseline cognitive functioning  Area of Impairment Orientation;Safety/judgement;Problem solving  Orientation Level Disoriented to;Time;Situation  Current Attention Level Sustained  Memory Decreased short-term memory  Following Commands Follows one step commands with increased time  Safety/Judgement Decreased awareness of deficits  Awareness Emergent  Problem Solving Slow processing;Requires verbal cues;Requires tactile cues;Decreased initiation;Difficulty sequencing  General Comments Follows one -step commands with extra time. Very pleasant, not saying much, agreeable. Not oriented.  Difficult to assess due to Hard  of hearing/deaf  Bed Mobility  Overal bed mobility Needs Assistance  Bed Mobility Sit to Supine  Sit to supine Mod assist  General bed mobility comments slow and with cues, but pt able to do most of bed mobility  Transfers  Overall transfer level Needs assistance  Equipment used Rolling walker (2 wheels)  Transfers Sit to/from Stand  Sit to Stand Max assist  General transfer comment lots of cues; pt fatiguing quickly.  Ambulation/Gait  Ambulation/Gait assistance Max assist  Gait Distance (Feet) 2 Feet  Assistive device Rolling walker (2 wheels)  Gait Pattern/deviations Decreased step length - right;Decreased step length - left;Decreased stance time - left  General Gait Details assist to steady; vc's for upright posture; assist for weightshifting to take steps at times; vc's for longer steps; increased effort/time to take steps (more difficult to take steps with L LE compared to R LE); chair follow for safety  Gait velocity decreased  Balance  Overall balance assessment Needs assistance  Sitting-balance support No upper extremity supported;Feet supported  Sitting balance-[name] Good  Sitting balance - Comments steady reaching within BOS; mild L lateral lean noted  Standing balance support Bilateral upper extremity supported;Reliant on assistive device for balance  Standing balance-[name] Poor  Standing balance comment posterior lean noted  General Comments  General comments (skin integrity, edema, etc.)  (Pt educated on role of PT, Limited understanding of current situation. Required increased time and explaination to stay on track and complete tasks.)  Exercises  Exercises General Lower Extremity  Total Joint Exercises  Long Arc Quad AROM;Both;10 reps;Seated  General Exercises - Lower Extremity  Ankle Circles/Pumps AROM;Both;10 reps;Seated  PT - End of Session  Equipment Utilized During Treatment Gait belt  Activity Tolerance Patient tolerated treatment well  Patient  left  in bed;with call bell/phone within reach;with bed alarm set  Nurse Communication Mobility status   PT - Assessment/Plan  PT Visit Diagnosis Unsteadiness on feet (R26.81);Other abnormalities of gait and mobility (R26.89);Repeated falls (R29.6);Muscle weakness (generalized) (M62.81);Hemiplegia and hemiparesis  Hemiplegia - Right/Left Left  Hemiplegia - caused by Cerebral infarction  PT Frequency (ACUTE ONLY) Min 1X/week  Recommendations for Other Services Rehab consult  Follow Up Recommendations Skilled nursing-short term rehab (<3 hours/day)  Can patient physically be transported by private vehicle No  Patient can return home with the following Two people to help with walking and/or transfers;Two people to help with bathing/dressing/bathroom;Assistance with cooking/housework;Assistance with feeding;Direct supervision/assist for medications management;Assist for transportation;Supervision due to cognitive status;Help with stairs or ramp for entrance  PT equipment Other (comment) (TBD at next level of care)  AM-PAC PT "6 Clicks" Mobility Outcome Measure (Version 2)  Help needed turning from your back to your side while in a flat bed without using bedrails? 2  Help needed moving from lying on your back to sitting on the side of a flat bed without using bedrails? 2  Help needed moving to and from a bed to a chair (including a wheelchair)? 2  Help needed standing up from a chair using your arms (e.g., wheelchair or bedside chair)? 1  Help needed to walk in hospital room? 1  Help needed climbing 3-5 steps with a railing?  1  6 Click Score 9  Consider Recommendation of Discharge To: CIR/SNF/LTACH  Progressive Mobility  What is the highest level of mobility based on the progressive mobility assessment? Level 2 (Chairfast) - Balance while sitting on edge of bed and cannot stand  Activity Transferred from chair to bed  PT Time Calculation  PT Start Time (ACUTE ONLY) 1306  PT Stop Time (ACUTE  ONLY) 1330  PT Time Calculation (min) (ACUTE ONLY) 24 min  PT General Charges  $$ ACUTE PT VISIT 1 Visit  PT Treatments  $Therapeutic Exercise 8-22 mins  $Therapeutic Activity 8-22 mins  Zadie Cleverly, PTA

## 2023-04-09 NOTE — Plan of Care (Signed)
  Problem: Education: Goal: Knowledge of disease or condition will improve Outcome: Progressing Goal: Knowledge of secondary prevention will improve (MUST DOCUMENT ALL) Outcome: Progressing Goal: Knowledge of patient specific risk factors will improve [name] N/A or DELETE if not current risk factor) Outcome: Progressing   Problem: Ischemic Stroke/TIA Tissue Perfusion: Goal: Complications of ischemic stroke/TIA will be minimized Outcome: Progressing   Problem: Coping: Goal: Will verbalize positive feelings about self Outcome: Progressing Goal: Will identify appropriate support needs Outcome: Progressing   Problem: Health Behavior/Discharge Planning: Goal: Ability to manage health-related needs will improve Outcome: Progressing Goal: Goals will be collaboratively established with patient/family Outcome: Progressing   Problem: Self-Care: Goal: Ability to participate in self-care as condition permits will improve Outcome: Progressing Goal: Verbalization of feelings and concerns over difficulty with self-care will improve Outcome: Progressing Goal: Ability to communicate needs accurately will improve Outcome: Progressing   Problem: Nutrition: Goal: Risk of aspiration will decrease Outcome: Progressing Goal: Dietary intake will improve Outcome: Progressing   Problem: Education: Goal: Knowledge of General Education information will improve Description: Including pain rating scale, medication(s)/side effects and non-pharmacologic comfort measures Outcome: Progressing   Problem: Health Behavior/Discharge Planning: Goal: Ability to manage health-related needs will improve Outcome: Progressing   Problem: Clinical Measurements: Goal: Ability to maintain clinical measurements within normal limits will improve Outcome: Progressing Goal: Will remain free from infection Outcome: Progressing Goal: Diagnostic test results will improve Outcome: Progressing Goal: Respiratory  complications will improve Outcome: Progressing Goal: Cardiovascular complication will be avoided Outcome: Progressing   Problem: Activity: Goal: Risk for activity intolerance will decrease Outcome: Progressing   Problem: Nutrition: Goal: Adequate nutrition will be maintained Outcome: Progressing   Problem: Coping: Goal: Level of anxiety will decrease Outcome: Progressing   Problem: Elimination: Goal: Will not experience complications related to bowel motility Outcome: Progressing Goal: Will not experience complications related to urinary retention Outcome: Progressing   Problem: Pain Management: Goal: General experience of comfort will improve Outcome: Progressing   Problem: Safety: Goal: Ability to remain free from injury will improve Outcome: Progressing   Problem: Skin Integrity: Goal: Risk for impaired skin integrity will decrease Outcome: Progressing   Problem: Education: Goal: Knowledge of discharge needs will improve Outcome: Progressing   Problem: Clinical Measurements: Goal: Postoperative complications will be avoided or minimized Outcome: Progressing   Problem: Respiratory: Goal: Ability to achieve and maintain a regular respiratory rate will improve Outcome: Progressing   Problem: Skin Integrity: Goal: Demonstration of wound healing without infection will improve Outcome: Progressing

## 2023-04-09 NOTE — Plan of Care (Signed)
  Problem: Education: Goal: Knowledge of disease or condition will improve Outcome: Progressing Goal: Knowledge of secondary prevention will improve (MUST DOCUMENT ALL) Outcome: Progressing Goal: Knowledge of patient specific risk factors will improve Loraine Leriche N/A or DELETE if not current risk factor) Outcome: Progressing   Problem: Ischemic Stroke/TIA Tissue Perfusion: Goal: Complications of ischemic stroke/TIA will be minimized Outcome: Progressing   Problem: Coping: Goal: Will verbalize positive feelings about self Outcome: Progressing Goal: Will identify appropriate support needs Outcome: Progressing   Problem: Health Behavior/Discharge Planning: Goal: Ability to manage health-related needs will improve Outcome: Progressing Goal: Goals will be collaboratively established with patient/family Outcome: Progressing   Problem: Self-Care: Goal: Ability to participate in self-care as condition permits will improve Outcome: Progressing Goal: Verbalization of feelings and concerns over difficulty with self-care will improve Outcome: Progressing Goal: Ability to communicate needs accurately will improve Outcome: Progressing   Problem: Nutrition: Goal: Risk of aspiration will decrease Outcome: Progressing Goal: Dietary intake will improve Outcome: Progressing   Problem: Education: Goal: Knowledge of General Education information will improve Description: Including pain rating scale, medication(s)/side effects and non-pharmacologic comfort measures Outcome: Progressing   Problem: Health Behavior/Discharge Planning: Goal: Ability to manage health-related needs will improve Outcome: Progressing   Problem: Clinical Measurements: Goal: Ability to maintain clinical measurements within normal limits will improve Outcome: Progressing Goal: Will remain free from infection Outcome: Progressing Goal: Diagnostic test results will improve Outcome: Progressing Goal: Respiratory  complications will improve Outcome: Progressing Goal: Cardiovascular complication will be avoided Outcome: Progressing   Problem: Activity: Goal: Risk for activity intolerance will decrease Outcome: Progressing   Problem: Nutrition: Goal: Adequate nutrition will be maintained Outcome: Progressing   Problem: Coping: Goal: Level of anxiety will decrease Outcome: Progressing   Problem: Elimination: Goal: Will not experience complications related to bowel motility Outcome: Progressing Goal: Will not experience complications related to urinary retention Outcome: Progressing   Problem: Pain Management: Goal: General experience of comfort will improve Outcome: Progressing   Problem: Safety: Goal: Ability to remain free from injury will improve Outcome: Progressing   Problem: Skin Integrity: Goal: Risk for impaired skin integrity will decrease Outcome: Progressing   Problem: Education: Goal: Knowledge of discharge needs will improve Outcome: Progressing   Problem: Clinical Measurements: Goal: Postoperative complications will be avoided or minimized Outcome: Progressing   Problem: Respiratory: Goal: Ability to achieve and maintain a regular respiratory rate will improve Outcome: Progressing   Problem: Skin Integrity: Goal: Demonstration of wound healing without infection will improve Outcome: Progressing

## 2023-04-10 ENCOUNTER — Inpatient Hospital Stay: Payer: Medicare (Managed Care)

## 2023-04-10 DIAGNOSIS — I639 Cerebral infarction, unspecified: Secondary | ICD-10-CM | POA: Diagnosis not present

## 2023-04-10 LAB — CBC
HCT: 35.9 % — ABNORMAL LOW (ref 39.0–52.0)
Hemoglobin: 11.5 g/dL — ABNORMAL LOW (ref 13.0–17.0)
MCH: 29.9 pg (ref 26.0–34.0)
MCHC: 32 g/dL (ref 30.0–36.0)
MCV: 93.5 fL (ref 80.0–100.0)
Platelets: 271 10*3/uL (ref 150–400)
RBC: 3.84 MIL/uL — ABNORMAL LOW (ref 4.22–5.81)
RDW: 13.4 % (ref 11.5–15.5)
WBC: 11.8 10*3/uL — ABNORMAL HIGH (ref 4.0–10.5)
nRBC: 0 % (ref 0.0–0.2)

## 2023-04-10 LAB — COMPREHENSIVE METABOLIC PANEL
ALT: 14 U/L (ref 0–44)
AST: 19 U/L (ref 15–41)
Albumin: 3.4 g/dL — ABNORMAL LOW (ref 3.5–5.0)
Alkaline Phosphatase: 104 U/L (ref 38–126)
Anion gap: 11 (ref 5–15)
BUN: 20 mg/dL (ref 8–23)
CO2: 19 mmol/L — ABNORMAL LOW (ref 22–32)
Calcium: 8.8 mg/dL — ABNORMAL LOW (ref 8.9–10.3)
Chloride: 107 mmol/L (ref 98–111)
Creatinine, Ser: 1.08 mg/dL (ref 0.61–1.24)
GFR, Estimated: >60 mL/min (ref >60)
Glucose, Bld: 177 mg/dL — ABNORMAL HIGH (ref 70–99)
Potassium: 3.8 mmol/L (ref 3.5–5.1)
Sodium: 137 mmol/L (ref 135–145)
Total Bilirubin: 0.8 mg/dL (ref <1.2)
Total Protein: 7 g/dL (ref 6.5–8.1)

## 2023-04-10 LAB — MAGNESIUM: Magnesium: 2.2 mg/dL (ref 1.7–2.4)

## 2023-04-10 LAB — PHOSPHORUS: Phosphorus: 3 mg/dL (ref 2.5–4.6)

## 2023-04-10 LAB — GLUCOSE, CAPILLARY: Glucose-Capillary: 171 mg/dL — ABNORMAL HIGH (ref 70–99)

## 2023-04-10 MED ORDER — PROCHLORPERAZINE EDISYLATE 10 MG/2ML IJ SOLN
10.0000 mg | Freq: Once | INTRAMUSCULAR | Status: AC
  Administered 2023-04-10: 10 mg via INTRAVENOUS
  Filled 2023-04-10: qty 2

## 2023-04-10 NOTE — Progress Notes (Signed)
PROGRESS NOTE    TREMON HOLLAN  ATF:[id number] DOB: 01-Feb-1931 DOA: 03/11/2023 PCP: Barbette Reichmann, MD  119A/119A-AA  LOS: 25 days   Brief hospital course:  SHAHBAZ [name] is a 89 y.o. male with medical history significant of hypertension, hyperlipidemia, recent CVA 3 weeks ago treated at [facility] was brought to the ED due to generalized weakness and dizziness being off balance x 1 day.    Patient came home from [facility] rehab 7 days PTA after multifocal small vessel infarcts.  Was doing well at home.  Ambulating on his own with good functional status, able to perform basic ADLs.  1 night before presentation to [facility] family states the patient did not want to eat dinner and started complaining of dizziness.  The morning of presentation patient was having trouble with coordination unable to raise himself out of bed.     On presentation to the ED MRI reveals new acute CVA.    Patient seen in consultation by neurology who ordered CTA to evaluate carotid anatomy. 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  Vascular surgery consulted and performed right carotid endarterectomy on 03/19/23.  Post-op, pt was more confused and pulled out his Foley causing penile trauma hematuria with subsequent acute urinary retention.  Assessment & Plan:   Acute CVA Right carotid stenosis S/p right carotid endarterectomy on 11/8 Patient presents 3 weeks after previous hospitalization for stroke and discharged from [facility].  Now with evidence of new acute CVA on imaging.  Pt was compliance with dual antiplatelet therapy and antihyperlipidemic therapy at home. --LDL 87.  switched home statin to lipitor 40 Plan: --cont ASA --cont plavix (resumed on 11/22) --cont Lipitor 40   Hematuria 2/2 penile trauma --per nursing, pt was confused post-op and pulled out his Foley.   --urology consulted --monitor for hematuria  Acute urinary  retention BPH --PVR 699 on 11/11.  urology consulted, Foley inserted.  Started on Flomax. --Foley removed yesterday for voiding trial.  Per RN, pt was having difficult voiding today. --ICU charge to insert coude Foley today --cont Flomax (new) --outpatient f/u with [facility]   AKI --Cr up to 2.24 this morning.  Baseline around 1. --improved with IVF --oral hydration now  Essential hypertension --BP has been low during hospitalization.   Hyperlipidemia --LDL 87.  switched home statin to lipitor 40 --cont Lipitor   GERD --cont PPI  Hospital delirium --more confused since the surgery. --supportive care  Bilious vomiting --started morning of 11/10, resolved by next day.  KUB showed mod stool burden in rectum --No vomiting until today.  --supportive care --Ensure no constipation.   DVT prophylaxis: SCD/Compression stockings Code Status: DNR  Family Communication:  Level of care: Med-Surg Dispo:   The patient is from: home Anticipated d/c is to: SNF rehab Anticipated d/c date is: whenever bed available   Subjective and Interval History:  Pt failed voiding trial today.  ICU charge RN to re-insert Foley (coude).  Pt had a large soft BM today.   Objective: Vitals:   04/10/23 0244 04/10/23 0534 04/10/23 0822 04/10/23 1627  BP: (!) 144/73 137/62 (!) 146/69 (!) 134/91  Pulse: 71 (!) 51 (!) 59 92  Resp: 20 (!) 22 17 (!) 22  Temp: 98.7 F (37.1 C) 97.7 F (36.5 C) 97.9 F (36.6 C) (!) 97.4 F (36.3 C)  TempSrc: Oral Oral    SpO2: 93% 95% 97% 97%  Height:  Intake/Output Summary (Last 24 hours) at 04/10/2023 2150 Last data filed at 04/10/2023 1300 Gross per 24 hour  Intake 118 ml  Output 67 ml  Net 51 ml     There were no vitals filed for this visit.  Examination:   Constitutional: NAD, alert HEENT: conjunctivae and lids normal, EOMI CV: No cyanosis.   RESP: normal respiratory effort, on RA Neuro: II - XII grossly intact.     Data  Reviewed: I have personally reviewed labs and imaging studies  Time spent: 50 minutes  [name], MD [facility] If 7PM-7AM, please contact night-coverage 04/10/2023, 9:50 PM

## 2023-04-10 NOTE — Plan of Care (Signed)
  Problem: Education: Goal: Knowledge of disease or condition will improve Outcome: Not Progressing   

## 2023-04-10 NOTE — Progress Notes (Signed)
       CROSS COVER NOTE  NAME: Thomas [name] MRN: [id number] DOB : Jul 31, 1930    Concern as stated by nurse / staff   Message received from [facility]. The pt Mr Ohler in 119 has been vomitting. Had a small episode earlier which I treated with PRN IV zofran and now had a larger episode. Please let me know if you would like me to do anything else to treat. Thank you!     Pertinent findings on chart review: 89 year old admitted with acute CVA on 10/4 and again on 10/31. Found to have significant right carotid stenosis. Under went right carotid endarterectomy. Now on statin, plavix, lipitor. Complication of bilious vomiting over this night had occurred one other time during hospitalization on 11/10 that self resolved. KUB at that time showed moderate stool burden   Assessment and  Interventions   Assessment:    04/10/2023    2:44 AM 04/09/2023    8:25 PM 04/09/2023    4:18 PM  Vitals with BMI  Systolic 144 119 295  Diastolic 73 69 73  Pulse 71 76 70   CBG 171 afebrile Plan: KUB Compazine 10 mg x1 dose       Donnie Mesa NP [facility] Cross Cover 7pm-7am - check amion for availability Pager [phone]

## 2023-04-11 ENCOUNTER — Inpatient Hospital Stay: Payer: Medicare (Managed Care)

## 2023-04-11 DIAGNOSIS — I639 Cerebral infarction, unspecified: Secondary | ICD-10-CM | POA: Diagnosis not present

## 2023-04-11 LAB — BASIC METABOLIC PANEL
Anion gap: 11 (ref 5–15)
BUN: 21 mg/dL (ref 8–23)
CO2: 21 mmol/L — ABNORMAL LOW (ref 22–32)
Calcium: 9.1 mg/dL (ref 8.9–10.3)
Chloride: 107 mmol/L (ref 98–111)
Creatinine, Ser: 0.97 mg/dL (ref 0.61–1.24)
GFR, Estimated: >60 mL/min (ref >60)
Glucose, Bld: 177 mg/dL — ABNORMAL HIGH (ref 70–99)
Potassium: 3.5 mmol/L (ref 3.5–5.1)
Sodium: 139 mmol/L (ref 135–145)

## 2023-04-11 LAB — OSMOLALITY: Osmolality: 296 mosm/kg — ABNORMAL HIGH (ref 275–295)

## 2023-04-11 LAB — COMPREHENSIVE METABOLIC PANEL
ALT: 13 U/L (ref 0–44)
AST: 18 U/L (ref 15–41)
Albumin: 3.2 g/dL — ABNORMAL LOW (ref 3.5–5.0)
Alkaline Phosphatase: 104 U/L (ref 38–126)
Anion gap: 9 (ref 5–15)
BUN: 17 mg/dL (ref 8–23)
CO2: 23 mmol/L (ref 22–32)
Calcium: 8.9 mg/dL (ref 8.9–10.3)
Chloride: 105 mmol/L (ref 98–111)
Creatinine, Ser: 0.82 mg/dL (ref 0.61–1.24)
GFR, Estimated: >60 mL/min (ref >60)
Glucose, Bld: 125 mg/dL — ABNORMAL HIGH (ref 70–99)
Potassium: 3.6 mmol/L (ref 3.5–5.1)
Sodium: 137 mmol/L (ref 135–145)
Total Bilirubin: 1.1 mg/dL (ref <1.2)
Total Protein: 7.4 g/dL (ref 6.5–8.1)

## 2023-04-11 LAB — CBC
HCT: 37.7 % — ABNORMAL LOW (ref 39.0–52.0)
Hemoglobin: 12.7 g/dL — ABNORMAL LOW (ref 13.0–17.0)
MCH: 29.8 pg (ref 26.0–34.0)
MCHC: 33.7 g/dL (ref 30.0–36.0)
MCV: 88.5 fL (ref 80.0–100.0)
Platelets: 288 10*3/uL (ref 150–400)
RBC: 4.26 MIL/uL (ref 4.22–5.81)
RDW: 13.8 % (ref 11.5–15.5)
WBC: 15.4 10*3/uL — ABNORMAL HIGH (ref 4.0–10.5)
nRBC: 0 % (ref 0.0–0.2)

## 2023-04-11 LAB — URINALYSIS, COMPLETE (UACMP) WITH MICROSCOPIC
Bilirubin Urine: NEGATIVE
Glucose, UA: NEGATIVE mg/dL
Ketones, ur: NEGATIVE mg/dL
Nitrite: POSITIVE — AB
Protein, ur: 100 mg/dL — AB
RBC / HPF: >50 RBC/hpf (ref 0–5)
Specific Gravity, Urine: 1.02 (ref 1.005–1.030)
Squamous Epithelial / HPF: 0 /[HPF] (ref 0–5)
WBC, UA: >50 WBC/hpf (ref 0–5)
pH: 6 (ref 5.0–8.0)

## 2023-04-11 LAB — GLUCOSE, CAPILLARY: Glucose-Capillary: 120 mg/dL — ABNORMAL HIGH (ref 70–99)

## 2023-04-11 MED ORDER — PIPERACILLIN-TAZOBACTAM 3.375 G IVPB
3.3750 g | Freq: Once | INTRAVENOUS | Status: AC
  Administered 2023-04-11: 3.375 g via INTRAVENOUS
  Filled 2023-04-11: qty 50

## 2023-04-11 MED ORDER — SODIUM CHLORIDE 3 % IV SOLN: INTRAVENOUS | Status: DC

## 2023-04-11 MED ORDER — SODIUM CHLORIDE 0.9 % IV SOLN: INTRAVENOUS | Status: AC

## 2023-04-11 MED ORDER — PIPERACILLIN-TAZOBACTAM 3.375 G IVPB
3.3750 g | Freq: Three times a day (TID) | INTRAVENOUS | Status: AC
  Administered 2023-04-12 – 2023-04-16 (×14): 3.375 g via INTRAVENOUS
  Filled 2023-04-11 (×14): qty 50

## 2023-04-11 MED ORDER — SENNA 8.6 MG PO TABS: 2.0000 | ORAL_TABLET | Freq: Every evening | ORAL | Status: DC | PRN

## 2023-04-11 MED ORDER — BISACODYL 10 MG RE SUPP: 10.0000 mg | Freq: Every day | RECTAL | Status: DC | PRN

## 2023-04-11 MED ORDER — PROCHLORPERAZINE EDISYLATE 10 MG/2ML IJ SOLN
10.0000 mg | Freq: Once | INTRAMUSCULAR | Status: AC
  Administered 2023-04-11: 10 mg via INTRAVENOUS
  Filled 2023-04-11: qty 2

## 2023-04-11 MED ORDER — POLYETHYLENE GLYCOL 3350 17 G PO PACK
17.0000 g | PACK | Freq: Every day | ORAL | Status: DC
  Administered 2023-04-12 – 2023-04-18 (×7): 17 g via ORAL
  Filled 2023-04-11 (×7): qty 1

## 2023-04-11 NOTE — Progress Notes (Signed)
RN asked me to reevaluate, pt not really acting himself. He is more drowsy. Per RN, has not eaten/drank anything today, unable to tolerate po, voice is hoarse, possibly more confused.   S Pt is oriented to self, place, situation. Is is not oriented to year, 1924 not 2024. He knows Thanksgiving holiday just occurred. He notes L arm weakness is about the same. He states hasn't had anything to eat/drink today. I also called family and spoke to his son in law, who agrees he seemed more drowsy and hoarse today and notes similar type confusion on previous hospitalizations but got better at rehab. Denies nausea, chest pain, abdominal pain, trouble breathing, headache.   O Benign abd exam.  No focal neuro deficits other than expected from recent CVA w/ L arm weakness. He is generally weak.  Hypertensive.   A/P Altered mental status / acute encephalopathy --> CT head, minimize CNS acting medications Persistent constipation, nausea --> CT abd/pelvis possible ileus, may not be able to tolerate po contrast bu ok for radiology to administer this if he will tolerate it Poor po intake --> 1L IV fluids overnight     Critical care time 35 min

## 2023-04-11 NOTE — Progress Notes (Signed)
Pharmacy Antibiotic Note  Thomas [name] is a 89 y.o. male admitted on 03/11/2023 with  Cholecystitis .  Pharmacy has been consulted for Zosyn dosing.  Provider ordered Zosyn 3.375 g IV x 1 Afebrile WBC 15.4 CT A/P: Cholelithiasis with gallbladder wall thickening and mild stranding. Changes may represent acute cholecystitis in the appropriate clinical setting.  Plan: Start Zosyn 3.375 g IV q8h Continue to monitor renal function and follow culture results   Height: 5\' 8"  (172.7 cm) IBW/kg (Calculated) : 68.4  Temp (24hrs), Avg:98.2 F (36.8 C), Min:97.5 F (36.4 C), Max:98.9 F (37.2 C)  Recent Labs  Lab 04/05/23 0653 04/10/23 0443 04/11/23 0615 04/11/23 1913  WBC 7.7 11.8*  --  15.4*  CREATININE 1.14 1.08 0.97 0.82    CrCl cannot be calculated (Unknown ideal weight.).    No Known Allergies  Antimicrobials this admission: 12/1 Zosyn >>    Dose adjustments this admission: None  Microbiology results: 11/8  MRSA PCR: Not detected  Thank you for allowing pharmacy to be a part of this patient's care.  Merryl Hacker, PharmD Clinical Pharmacist 04/11/2023 9:00 PM

## 2023-04-11 NOTE — Progress Notes (Deleted)
Manuela Schwartz NP notified of NA  of 119 this am. Also vomiting of green bile.  Patient to be transferred to ICU  bed 11.  Report called to RN accepting patient,

## 2023-04-11 NOTE — Progress Notes (Signed)
PROGRESS NOTE    Thomas [name]   UEA:[id number] DOB: 06-17-1930  DOA: 03/11/2023 Date of Service: 04/11/23 which is hospital day 26  PCP: Barbette Reichmann, MD    HPI: Thomas [name] is a 89 y.o. male with medical history significant of hypertension, hyperlipidemia, GERD, recent CVA 3 weeks prior to presentation here, was treated at [facility] and d/c to rehab 1 week prior to presentation here. Was doing well but 1 night before presentation to [facility] family states the patient did not want to eat dinner and started complaining of dizziness. The morning of presentation patient was having trouble with coordination unable to raise himself out of bed. Primary caregiver stated that the patient was discharged on dual antiplatelet therapy aspirin and Plavix and has been compliant with all medications.   Hospital course / significant events:  10/31: admitted to hospitalist service for new CVA  11/05: cardiology clearance for surgery  11/08: R Carotid Endarterectomy  11/11: urology consult for traumatic Foley catheter self-removal, re-placed catheter Intermittent vomiting/nausea w/ negative w/u labs and KUB Placement difficulty  Consultants:  Neurology Vascular Surgery Cardiology Urology   Procedures/Surgeries: 11/08: R Carotid Endarterectomy       ASSESSMENT & PLAN:   Acute CVA w/ Hx recent previous CVA on Statin + DAPT  Right carotid stenosis S/p right carotid endarterectomy on 11/8 cont ASA cont plavix (resumed on 11/22) cont Lipitor 40   Hematuria 2/2 penile trauma w/ traumatic foley catheter self-removal Chronic Foley d/t BPH w/ urinary retenetion  per nursing, pt was confused post-op and pulled out his Foley.   urology consulted monitor for hematuria Foley removed 11/29 for voiding trial.  Per RN, pt was having difficult voiding 11/30, ICU charge reinserted coude Foley 11/30 cont Flomax (new) outpatient f/u with [facility]    AKI - resolved Monitor BMP Po hydration  encouraged Maintain Foley as above    Essential hypertension BP has been low during hospitalization.  Adjust Rx as needed   Hyperlipidemia cont Lipitor   GERD cont PPI   Hospital delirium more confused since the surgery. supportive care   Bilious vomiting started morning of 11/10, resolved by next day.  KUB showed mod stool burden in rectum. Recurred 11/30 and into 12/01 Repeat KUB overinght 12/01 --> no concerns  supportive care Ensure no constipation - bowel regimen ordered  Low threshold to obtain CT abdomen but no pain or exam findings today to warrant further imaging at this time, will see how he tolerates po     Normal weight based on BMI: Body mass index is 24.94 kg/m.  Underweight - under 18.5  normal weight - 18.5 to 24.9 overweight - 25 to 29.9 obese - 30 or more   DVT prophylaxis: SCD IV fluids: no continuous IV fluids  Nutrition: dysphagia 3 diet  Central lines / invasive devices: Foley cath   Code Status: DNR ACP documentation reviewed: 04/11/23 and none on file in [facility]  [facility] needs: SNF rehab placement  Barriers to dispo / significant pending items: Placement difficulty - family initially unhapppy w/ facilities offered 11/13, 11/18 insurance auth was pending, somehow got cancelled, still pending as of 12/01             Subjective / Brief ROS:  Patient reports "feeling bad" w/ nausea but better than he was earlier this morning. Denies pain. Reports constipation, last BM he thinks a few days ago  Denies CP/SOB.  Pain controlled.  Denies new weakness.  Tolerating diet but not  really eating or drinking much, low appetite.    Family Communication: none at this time     Objective Findings:  Vitals:   04/10/23 1627 04/10/23 2223 04/11/23 0405 04/11/23 0849  BP: (!) 134/91 117/67 (!) 165/65 (!) 169/81  Pulse: 92 99 (!) 58 80  Resp: (!) 22 18 (!) 22 18  Temp: (!) 97.4 F (36.3 C) 98.8 F (37.1 C) 98 F (36.7 C) (!) 97.5 F (36.4 C)   TempSrc:   Oral   SpO2: 97% 94% 97% 97%  Height:        Intake/Output Summary (Last 24 hours) at 04/11/2023 1406 Last data filed at 04/11/2023 1028 Gross per 24 hour  Intake 3 ml  Output 802 ml  Net -799 ml   There were no vitals filed for this visit.  Examination:  Physical Exam Constitutional:      General: He is not in acute distress.    Appearance: He is not ill-appearing or diaphoretic.  Pulmonary:     Effort: Pulmonary effort is normal.     Breath sounds: Normal breath sounds.  Abdominal:     General: Abdomen is flat. Bowel sounds are decreased. There is no distension.     Palpations: Abdomen is soft.     Tenderness: There is no abdominal tenderness. There is no guarding or rebound.  Skin:    General: Skin is warm and dry.  Neurological:     General: No focal deficit present.     Mental Status: He is alert and oriented to person, place, and time.  Psychiatric:        Mood and Affect: Mood normal.        Behavior: Behavior normal.          Scheduled Medications:   aspirin  81 mg Oral Daily   atorvastatin  40 mg Oral Daily   Chlorhexidine Gluconate Cloth  6 each Topical Daily   clopidogrel  75 mg Oral Daily   docusate sodium  100 mg Oral Daily   hydrocortisone cream   Topical BID   pantoprazole  40 mg Oral QHS   polyethylene glycol  17 g Oral Daily   sertraline  25 mg Oral Daily   sodium chloride flush  3 mL Intravenous Q12H   tamsulosin  0.4 mg Oral Daily    Continuous Infusions:   PRN Medications:  acetaminophen **OR** acetaminophen, alum & mag hydroxide-simeth, bisacodyl, guaiFENesin-dextromethorphan, hydrALAZINE, labetalol, metoprolol tartrate, ondansetron, oxyCODONE, phenol, senna, sorbitol  Antimicrobials from admission:  Anti-infectives (From admission, onward)    Start     Dose/Rate Route Frequency Ordered Stop   03/19/23 1730  ceFAZolin (ANCEF) IVPB 2g/100 mL premix        2 g 200 mL/hr over 30 Minutes Intravenous Every 8 hours 03/19/23  1627 03/20/23 0211   03/18/23 2152  ceFAZolin (ANCEF) IVPB 2g/100 mL premix        2 g 200 mL/hr over 30 Minutes Intravenous 30 min pre-op 03/18/23 2152 03/19/23 0813           Data Reviewed:  I have personally reviewed the following...  CBC: Recent Labs  Lab 04/05/23 0653 04/10/23 0443  WBC 7.7 11.8*  HGB 10.0* 11.5*  HCT 30.9* 35.9*  MCV 93.6 93.5  PLT 254 271   Basic Metabolic Panel: Recent Labs  Lab 04/05/23 0653 04/10/23 0443 04/11/23 0615  NA 140 137 139  K 4.3 3.8 3.5  CL 108 107 107  CO2 25 19* 21*  GLUCOSE  88 177* 177*  BUN 16 20 21   CREATININE 1.14 1.08 0.97  CALCIUM 8.5* 8.8* 9.1  MG 2.2 2.2  --   PHOS  --  3.0  --    GFR: CrCl cannot be calculated (Unknown ideal weight.). Liver Function Tests: Recent Labs  Lab 04/10/23 0443  AST 19  ALT 14  ALKPHOS 104  BILITOT 0.8  PROT 7.0  ALBUMIN 3.4*   No results for input(s): "LIPASE", "AMYLASE" in the last 168 hours. No results for input(s): "AMMONIA" in the last 168 hours. Coagulation Profile: No results for input(s): "INR", "PROTIME" in the last 168 hours. Cardiac Enzymes: No results for input(s): "CKTOTAL", "CKMB", "CKMBINDEX", "TROPONINI" in the last 168 hours. BNP (last 3 results) No results for input(s): "PROBNP" in the last 8760 hours. HbA1C: No results for input(s): "HGBA1C" in the last 72 hours. CBG: Recent Labs  Lab 04/10/23 0434  GLUCAP 171*   Lipid Profile: No results for input(s): "CHOL", "HDL", "LDLCALC", "TRIG", "CHOLHDL", "LDLDIRECT" in the last 72 hours. Thyroid Function Tests: No results for input(s): "TSH", "T4TOTAL", "FREET4", "T3FREE", "THYROIDAB" in the last 72 hours. Anemia Panel: No results for input(s): "VITAMINB12", "FOLATE", "FERRITIN", "TIBC", "IRON", "RETICCTPCT" in the last 72 hours. Most Recent Urinalysis On File:     Component Value Date/Time   COLORURINE YELLOW (A) 03/11/2023 1202   APPEARANCEUR CLEAR (A) 03/11/2023 1202   APPEARANCEUR Clear  10/19/2011 0819   LABSPEC 1.017 03/11/2023 1202   LABSPEC 1.016 10/19/2011 0819   PHURINE 7.0 03/11/2023 1202   GLUCOSEU NEGATIVE 03/11/2023 1202   GLUCOSEU Negative 10/19/2011 0819   HGBUR NEGATIVE 03/11/2023 1202   BILIRUBINUR NEGATIVE 03/11/2023 1202   BILIRUBINUR Negative 10/19/2011 0819   KETONESUR NEGATIVE 03/11/2023 1202   PROTEINUR NEGATIVE 03/11/2023 1202   NITRITE NEGATIVE 03/11/2023 1202   LEUKOCYTESUR NEGATIVE 03/11/2023 1202   LEUKOCYTESUR Negative 10/19/2011 0819   Sepsis Labs: @LABRCNTIP (procalcitonin:4,lacticidven:4) Microbiology: No results found for this or any previous visit (from the past 240 hour(s)).    Radiology Studies last 3 days: DG Abd 1 View  Result Date: 04/10/2023 CLINICAL DATA:  89 year old male with history of vomiting. EXAM: ABDOMEN - 1 VIEW COMPARISON:  Abdominal radiograph 03/21/2023. FINDINGS: The bowel gas pattern is normal. No radio-opaque calculi or other significant radiographic abnormality are seen. IMPRESSION: Negative. Electronically Signed   By: Trudie Reed M.D.   On: 04/10/2023 06:24         Sunnie Nielsen, DO [facility] 04/11/2023, 2:06 PM    Dictation software may have been used to generate the above note. Typos may occur and escape review in typed/dictated notes. Please contact Dr Lyn Hollingshead directly for clarity if needed.  Staff may message me via secure chat in Epic  but this may not receive an immediate response,  please page me for urgent matters!  If 7PM-7AM, please contact night coverage [email or web address]

## 2023-04-11 NOTE — Progress Notes (Addendum)
1810 Code stroke cart activated in CT 1811 Dr Otelia Limes paged- [id number] RN noted at 1800 pt had aphasia, confusion. H/O CVA 1 month ago 70 Dr Otelia Limes at bedside in CT to examine pt- pt now speaking but is confused to time and situation which is pt baseline per bedside RN. No TNK per Dr Otelia Limes.

## 2023-04-11 NOTE — Significant Event (Signed)
Rapid Response Event Note   Reason for Call : called for code stroke   Initial Focused Assessment: mumbles, more weakness noted by RN, reported different from baseline, hx of strokes, code stroke activated.       Interventions: Taken to stat head CT, Dr Otelia Limes accompanied, did assessment. Holly code stroke team on teleneuro   Plan of Care: No TNK per Lindzen, VSS, may return to room. RN Debi aware.    Event Summary: as above  MD Notified: Lindwood Coke [phone] Call Time:1802 Arrival Time:1804 End [id number]  Alfonso Ramus, RN

## 2023-04-11 NOTE — Progress Notes (Signed)
Lab information given to me was incorrect for Addison Naegeli.   Transfer to ICU was canceled.

## 2023-04-11 NOTE — Hospital Course (Addendum)
HPI: Thomas [name] is a 89 y.o. male with medical history significant of hypertension, hyperlipidemia, GERD, recent CVA 3 weeks prior to presentation here, was treated at [facility] and d/c to rehab 1 week prior to presentation here. Was doing well but 1 night before presentation to [facility] family states the patient did not want to eat dinner and started complaining of dizziness. The morning of presentation patient was having trouble with coordination unable to raise himself out of bed. Primary caregiver stated that the patient was discharged on dual antiplatelet therapy aspirin and Plavix and has been compliant with all medications.   Hospital course / significant events:  10/31: admitted to hospitalist service for new CVA  11/05: cardiology clearance for surgery  11/08: R Carotid Endarterectomy  11/11: [name] consult for traumatic Foley catheter self-removal, re-placed catheter Intermittent vomiting/nausea w/ negative w/u labs and KUB Placement difficulty 12/01: more lethargic, code stroke but no concern for new acute CVA, workup revealed likely cholecystitis, started Zosyn 12/02: gen surg advises to continue abx and monitor, pt appears improved no plans for surgical intervention, would consider chole drain w/ IR if needed   Consultants:  Neurology Vascular Surgery Cardiology [name]  General Surgery   Procedures/Surgeries: 11/08: R Carotid Endarterectomy       ASSESSMENT & PLAN:   Sepsis d/t Cholecystitis General surgery following Continue Zosyn No procedures planned at this time given clinical improvement   Acute CVA w/ Hx recent previous CVA on Statin + DAPT  Right carotid stenosis S/p right carotid endarterectomy on 11/8 cont ASA cont plavix (resumed on 11/22) cont Lipitor 40   Hematuria 2/2 penile trauma w/ traumatic foley catheter self-removal Chronic Foley d/t BPH w/ urinary retenetion  per nursing, pt was confused post-op and pulled out his Foley.   [name]  consulted monitor for hematuria Foley removed 11/29 for voiding trial.  Per RN, pt was having difficult voiding 11/30, ICU charge reinserted coude Foley 11/30 cont Flomax (new) outpatient f/u with Thomas [name]    AKI - resolved Monitor BMP Po hydration encouraged Maintain Foley as above    Essential hypertension BP has been low during hospitalization.  Adjust Rx as needed   Hyperlipidemia cont Lipitor   GERD cont PPI   Hospital delirium more confused past few days but better today w/ tx for cholecystitis  supportive care       Normal weight based on BMI: Body mass index is 24.94 kg/m.  Underweight - under 18.5  normal weight - 18.5 to 24.9 overweight - 25 to 29.9 obese - 30 or more   DVT prophylaxis: SCD IV fluids: no continuous IV fluids  Nutrition: dysphagia 3 diet  Central lines / invasive devices: Foley cath   Code Status: DNR ACP documentation reviewed: 04/11/23 and none on file in [facility]  [facility] needs: SNF rehab placement  Barriers to dispo / significant pending items: Placement difficulty - family initially unhapppy w/ facilities offered 11/13, 11/18 insurance auth was pending, somehow got cancelled, still pending as of 12/01

## 2023-04-11 NOTE — Progress Notes (Signed)
       CROSS COVER NOTE  NAME: Thomas [name] MRN: [id number] DOB : 03-14-1931    Date of Service   04/11/2023   HPI/Events of Note   Followed up on patient's imaging result for Dr. Mardelle Matte orders.  CT abdomen pelvis showed cholelithiasis with gallbladder wall thickening suggestive of possible acute cholecystitis.  Refer to imaging report for details.  Interventions   Plan: General Surgery consulted, and Dr. Maurine Minister reported that patient will be seen by surgical services this a.m. Patient started on Zosyn for broad-spectrum antibiotic coverage.       Earland Reish Lamin Geradine Girt, MSN, APRN, AGACNP-BC [facility] [place] Pager: [phone]. Check Amion for Availability

## 2023-04-11 NOTE — Progress Notes (Signed)
Patient vomited x 2 this evening.  Emesis was green liquid with some food particles.  Patient denies abd pain but did state wa sa little nauseated . Zofran given after first emesis.

## 2023-04-11 NOTE — Progress Notes (Signed)
   04/11/23 1800  Spiritual Encounters  Type of Visit Initial  Care provided to: Patient  Referral source Code page  Reason for visit Code  OnCall Visit Yes   Chaplain responded to Code Stroke. No family present. Patient taken to CT. Chaplain services available should family arrive and need assistance.

## 2023-04-11 NOTE — Plan of Care (Signed)
  Problem: Education: Goal: Knowledge of General Education information will improve Description: Including pain rating scale, medication(s)/side effects and non-pharmacologic comfort measures Outcome: Progressing   Problem: Health Behavior/Discharge Planning: Goal: Ability to manage health-related needs will improve Outcome: Progressing   Problem: Coping: Goal: Level of anxiety will decrease Outcome: Progressing   Problem: Pain Management: Goal: General experience of comfort will improve Outcome: Progressing   Problem: Safety: Goal: Ability to remain free from injury will improve Outcome: Progressing

## 2023-04-11 NOTE — Progress Notes (Addendum)
NEUROHOSPITALIST PROGRESS NOTE   Requesting physician: Dr. Lyn Hollingshead  Reason for Consult: Altered mental status  History obtained from:  Chart and Communication with Primary Team  HPI:                                                                                                                                          Thomas [name] is an 89 y.o. male with a PMHx of HTN, HLD, GERD, recent multifocal strokes in October, was treated at [facility] and d/c to rehab 1 week prior to presentation here on October 31st for dizziness, difficulty with coordination at home. MRI on presentation to [facility] revealed a new acute stroke in the right anterior thalamus in addition to multiple old lacunar infarctions, atrophy and severe chronic small vessel disease. The new stroke had occurred despite having been on dual antiplatelet therapy with aspirin and Plavix. 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 Vascular Surgery and Neurology were consulted and it was determined that the patient had fetal posterior cerebellar arteries bilaterally making his carotids more likely the source of recurrent stroke than the posterior circulation. He underwent right CEA on 11/8 with bovine pericardial patch reconstruction, as well as resection of a right internal carotid artery kink with removal of 20 mm of internal carotid artery and direct reanastomosis to the bulb. He has had a long hospital stay here at [facility] complicated by traumatic foley catheter self-removal, intermittent vomiting/nausea with negative work up labs and KUB, as well as placement difficulty.    Per Hospitalist note from 4:20 PM today: "Pt is oriented to self, place, situation. Is is not oriented to year, 1924 not 2024. He knows Thanksgiving holiday just occurred. He notes L arm weakness is about the same. He states hasn't had anything to eat/drink today. I also called family and spoke to his son in law, who agrees he seemed more drowsy  and hoarse today and notes similar type confusion on previous hospitalizations but got better at rehab. Denies nausea, chest pain, abdominal pain, trouble breathing, headache." Hospitalist exam at that time revealed no focal neuro deficits other than expected from recent CVA w/ L arm weakness. Generalized weakness was also noted.     Review of recent imaging studies obtained to evaluate recent stroke:  MRI brain (03/11/23): 1. Acute infarct of the right anterior thalamus. No acute hemorrhage or significant mass effect. 2. 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  CTA of head and neck (03/12/23): 1. The known right thalamic infarct is again seen, better evaluated on MRI from 1 day prior. No new acute intracranial pathology. 2. New focal calcification in the proximal right P2 segment resulting in  severe stenosis. Additional multifocal severe stenosis of both PCAs is unchanged. 3. Mixed plaque resulting in approximately 50% stenosis of the proximal right ICA in the neck, unchanged. 4. Calcified plaque at the left carotid bifurcation is also suspected to resulting in approximately 50% stenosis, though evaluation is made difficult by blooming artifact from the calcium. 5. 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 6. Unchanged right neck mass.  Past Medical History:  Diagnosis Date   Arthritis    Carpal tunnel syndrome on both sides    GERD (gastroesophageal reflux disease)    Hyperlipidemia    Hypertension     Past Surgical History:  Procedure Laterality Date   ENDARTERECTOMY Right 03/19/2023   Procedure: ENDARTERECTOMY CAROTID;  Surgeon: Renford Dills, MD;  Location: [facility] ORS;  Service: Vascular;  Laterality: Right;   INGUINAL HERNIA REPAIR  2001   REPLACEMENT TOTAL KNEE Bilateral     Family History  Problem Relation Age of Onset    Cancer Sister        Liver cancer   Melanoma Sister    Cancer Brother        Pancreatic   Cancer Brother        Lung cancer   Heart disease Brother               Social History:  reports that he has never smoked. He has never used smokeless tobacco. He reports that he does not drink alcohol and does not use drugs.  No Known Allergies  MEDICATIONS:                                                                                                                     Scheduled:  aspirin  81 mg Oral Daily   atorvastatin  40 mg Oral Daily   Chlorhexidine Gluconate Cloth  6 each Topical Daily   clopidogrel  75 mg Oral Daily   docusate sodium  100 mg Oral Daily   hydrocortisone cream   Topical BID   pantoprazole  40 mg Oral QHS   polyethylene glycol  17 g Oral Daily   sertraline  25 mg Oral Daily   sodium chloride flush  3 mL Intravenous Q12H   tamsulosin  0.4 mg Oral Daily   Continuous:  sodium chloride 100 mL/hr at 04/11/23 1758     ROS:  Unable to obtain a detailed ROS due to confusion.    Blood pressure (!) 161/96, pulse (!) 103, temperature 97.7 F (36.5 C), resp. rate 20, height 5\' 8"  (1.727 m), SpO2 97%.   General Examination:                                                                                                       Physical Exam HEENT- [place]/AT. Healing linear surgical scar from recent CEA on the right is noted. Decreased hydration of oral mucosa is noted.  Lungs- Respirations unlabored Extremities- Warm and well-perfused  Neurological Examination Mental Status: Awake and alert. Oriented to "Monday", does not know the season or the month, not oriented to the year, does know the city and the state, unable to recognize that he is in a hospital, replying "a nursing home, I think" when asked for his current location. Speech is  hypophonic and sparse, but fluent with intact naming for common items. Makes good eye contact and is cooperative with exam but requires repeated instructions to perform several commands. Appears confused. Not agitated. Increased latencies of motor and verbal responses.  Cranial Nerves: II: Blinks to threat in temporal visual field OD. No blink to threat in temporal visual field OS. PERRL. III,IV, VI: No ptosis. EOMI. No nystagmus. V: FT sensation subjectively equal bilaterally with no extinction to DSS.  VII: Smile symmetric VIII: Hearing intact to voice IX,X: Speech is hypophonic XI: Symmetric XII: Tongue with equivocal slight deviation to the right.  Motor: RUE: 4+/5 LUE: 3/5 proximally and distally with increased tone RLE: 4/5 LLE: 2/5 - falls to bed almost immediately upon release but will withdraw and wiggle toes/foot to noxious   Sensory: Subjectively intact FT to LUE. Intact to FT RLE. States he cannot feel scratch and FT sensation to LLE, but will react to noxious plantar stimulation Deep Tendon Reflexes: 2+ and symmetric bilateral biceps and brachioradialis. Absent patellars in the context of prior knee operations.  Plantars: Cortical toes are noted bilaterally Cerebellar: Slow but with no ataxia with FNF on the right. Mild dysmetria out of proportion to weakness of LUE.  Gait: Deferred    Lab Results: Basic Metabolic Panel: Recent Labs  Lab 04/05/23 0653 04/10/23 0443 04/11/23 0615  NA 140 137 139  K 4.3 3.8 3.5  CL 108 107 107  CO2 25 19* 21*  GLUCOSE 88 177* 177*  BUN 16 20 21   CREATININE 1.14 1.08 0.97  CALCIUM 8.5* 8.8* 9.1  MG 2.2 2.2  --   PHOS  --  3.0  --     CBC: Recent Labs  Lab 04/05/23 0653 04/10/23 0443  WBC 7.7 11.8*  HGB 10.0* 11.5*  HCT 30.9* 35.9*  MCV 93.6 93.5  PLT 254 271    Cardiac Enzymes: No results for input(s): "CKTOTAL", "CKMB", "CKMBINDEX", "TROPONINI" in the last 168 hours.  Lipid Panel: No results for input(s): "CHOL",  "TRIG", "HDL", "CHOLHDL", "VLDL", "LDLCALC" in the last 168 hours.  Imaging: DG Abd 1 View  Result Date: 04/10/2023 CLINICAL DATA:  89 year old male with history of  vomiting. EXAM: ABDOMEN - 1 VIEW COMPARISON:  Abdominal radiograph 03/21/2023. FINDINGS: The bowel gas pattern is normal. No radio-opaque calculi or other significant radiographic abnormality are seen. IMPRESSION: Negative. Electronically Signed   By: Trudie Reed M.D.   On: 04/10/2023 06:24     Assessment: 89 year old male with recent right thalamic infarction, preceded 3 weeks earlier by multifocal strokes, now with acute change in mentation.  - Exam reveals a confused and disoriented elderly male who does not appear toxic. He has increased latencies of verbal and motor responses. Speech is hypophonic and sparse, but fluent and is able to name common items. Motor and sensory deficits on the left are compatible with his recent right thalamic stroke.  - CT head: No evidence of an acute intracranial abnormality. Parenchymal atrophy with advanced chronic small vessel ischemic disease and multiple chronic infarcts as described. Mild bilateral ethmoid sinusitis. - CBG 120. BMP from this morning is essentially unremarkable. WBC yesterday was elevated at 11.9.  - Overall impression: Acute encephalopathy secondary to toxic, metabolic or infectious etiology, versus hospital delirium. Subclinical seizure with postictal state is possible, but is felt to be low on the DDx.  - Not a TNK candidate due to recent prior stroke. Overall clinical changes are not consistent with LVO.    Recommendations: - IVF - Repeat CBC (ordered) - Work up and management of possible toxic/metabolic encephalopathy. Evaluate for possible infection.  - Continue ASA, Plavix and atorvastatin.  - EEG in AM - Hospitalist Team has been notified.   60 minutes spent in the emergent neurological evaluation and management of this patient for suspected acute stroke.      Electronically signed: Dr. Caryl Pina 04/11/2023, 6:02 PM

## 2023-04-12 ENCOUNTER — Ambulatory Visit: Payer: Medicare (Managed Care)

## 2023-04-12 DIAGNOSIS — R531 Weakness: Secondary | ICD-10-CM

## 2023-04-12 DIAGNOSIS — R10811 Right upper quadrant abdominal tenderness: Secondary | ICD-10-CM | POA: Diagnosis not present

## 2023-04-12 DIAGNOSIS — I639 Cerebral infarction, unspecified: Secondary | ICD-10-CM | POA: Diagnosis not present

## 2023-04-12 LAB — CBC
HCT: 35.2 % — ABNORMAL LOW (ref 39.0–52.0)
Hemoglobin: 11.7 g/dL — ABNORMAL LOW (ref 13.0–17.0)
MCH: 29.9 pg (ref 26.0–34.0)
MCHC: 33.2 g/dL (ref 30.0–36.0)
MCV: 90 fL (ref 80.0–100.0)
Platelets: 267 10*3/uL (ref 150–400)
RBC: 3.91 MIL/uL — ABNORMAL LOW (ref 4.22–5.81)
RDW: 13.8 % (ref 11.5–15.5)
WBC: 12.2 10*3/uL — ABNORMAL HIGH (ref 4.0–10.5)
nRBC: 0 % (ref 0.0–0.2)

## 2023-04-12 LAB — COMPREHENSIVE METABOLIC PANEL
ALT: 13 U/L (ref 0–44)
AST: 15 U/L (ref 15–41)
Albumin: 2.8 g/dL — ABNORMAL LOW (ref 3.5–5.0)
Alkaline Phosphatase: 87 U/L (ref 38–126)
Anion gap: 10 (ref 5–15)
BUN: 17 mg/dL (ref 8–23)
CO2: 22 mmol/L (ref 22–32)
Calcium: 8.7 mg/dL — ABNORMAL LOW (ref 8.9–10.3)
Chloride: 106 mmol/L (ref 98–111)
Creatinine, Ser: 0.97 mg/dL (ref 0.61–1.24)
GFR, Estimated: >60 mL/min (ref >60)
Glucose, Bld: 127 mg/dL — ABNORMAL HIGH (ref 70–99)
Potassium: 3.7 mmol/L (ref 3.5–5.1)
Sodium: 138 mmol/L (ref 135–145)
Total Bilirubin: 1.1 mg/dL (ref <1.2)
Total Protein: 6.3 g/dL — ABNORMAL LOW (ref 6.5–8.1)

## 2023-04-12 NOTE — TOC Progression Note (Signed)
Transition of Care [facility]) - Progression Note    Patient Details  Name: Thomas [name] MRN: [id number] Date of Birth: 13-Aug-1930  [facility] of [facility]) CM/SW Contact  Allena Katz, LCSW Phone Number: 04/12/2023, 9:22 AM  Clinical Narrative:   CSW spoke with tiffany at [facility] is still pending.    Expected Discharge Plan: Skilled Nursing Facility Barriers to Discharge: Continued Medical Work up  Expected Discharge Plan and Services                                               Social Determinants of Health (SDOH) Interventions SDOH Screenings   Food Insecurity: No Food Insecurity (03/12/2023)  Housing: Low Risk  (03/12/2023)  Transportation Needs: No Transportation Needs (03/12/2023)  Utilities: Not At Risk (03/12/2023)  Financial Resource Strain: Low Risk  (10/12/2022)   Received from [facility]  Tobacco Use: Low Risk  (03/19/2023)  Health Literacy: High Risk (03/12/2023)   [facility] from [facility]    Readmission Risk Interventions     No data to display

## 2023-04-12 NOTE — Plan of Care (Signed)
  Problem: Education: Goal: Knowledge of disease or condition will improve Outcome: Progressing Goal: Knowledge of secondary prevention will improve (MUST DOCUMENT ALL) Outcome: Progressing Goal: Knowledge of patient specific risk factors will improve Loraine Leriche N/A or DELETE if not current risk factor) Outcome: Progressing   Problem: Ischemic Stroke/TIA Tissue Perfusion: Goal: Complications of ischemic stroke/TIA will be minimized Outcome: Progressing   Problem: Coping: Goal: Will verbalize positive feelings about self Outcome: Progressing Goal: Will identify appropriate support needs Outcome: Progressing   Problem: Health Behavior/Discharge Planning: Goal: Ability to manage health-related needs will improve Outcome: Progressing Goal: Goals will be collaboratively established with patient/family Outcome: Progressing   Problem: Self-Care: Goal: Ability to participate in self-care as condition permits will improve Outcome: Progressing Goal: Verbalization of feelings and concerns over difficulty with self-care will improve Outcome: Progressing Goal: Ability to communicate needs accurately will improve Outcome: Progressing   Problem: Nutrition: Goal: Risk of aspiration will decrease Outcome: Progressing Goal: Dietary intake will improve Outcome: Progressing   Problem: Education: Goal: Knowledge of General Education information will improve Description: Including pain rating scale, medication(s)/side effects and non-pharmacologic comfort measures Outcome: Progressing   Problem: Health Behavior/Discharge Planning: Goal: Ability to manage health-related needs will improve Outcome: Progressing   Problem: Clinical Measurements: Goal: Ability to maintain clinical measurements within normal limits will improve Outcome: Progressing Goal: Will remain free from infection Outcome: Progressing Goal: Diagnostic test results will improve Outcome: Progressing Goal: Respiratory  complications will improve Outcome: Progressing Goal: Cardiovascular complication will be avoided Outcome: Progressing   Problem: Activity: Goal: Risk for activity intolerance will decrease Outcome: Progressing   Problem: Nutrition: Goal: Adequate nutrition will be maintained Outcome: Progressing   Problem: Coping: Goal: Level of anxiety will decrease Outcome: Progressing   Problem: Elimination: Goal: Will not experience complications related to bowel motility Outcome: Progressing Goal: Will not experience complications related to urinary retention Outcome: Progressing   Problem: Pain Management: Goal: General experience of comfort will improve Outcome: Progressing   Problem: Safety: Goal: Ability to remain free from injury will improve Outcome: Progressing   Problem: Skin Integrity: Goal: Risk for impaired skin integrity will decrease Outcome: Progressing   Problem: Education: Goal: Knowledge of discharge needs will improve Outcome: Progressing   Problem: Clinical Measurements: Goal: Postoperative complications will be avoided or minimized Outcome: Progressing   Problem: Respiratory: Goal: Ability to achieve and maintain a regular respiratory rate will improve Outcome: Progressing   Problem: Skin Integrity: Goal: Demonstration of wound healing without infection will improve Outcome: Progressing

## 2023-04-12 NOTE — Progress Notes (Signed)
PT Cancellation Note  Patient Details Name: Thomas [name] MRN: [id number] DOB: 1931-02-05   Cancelled Treatment:     Therapist in to see pt, family just arriving with questions re: POC. Pt with c/c of Abd discomfort, seen by surgery early, pt started on IV abx for acute cholecystitis. Unable to tolerate skilled PT this date, will re-attempt next available date/time per POC.    Jannet Askew 04/12/2023, 3:32 PM

## 2023-04-12 NOTE — Progress Notes (Signed)
Eeg done 

## 2023-04-12 NOTE — Consult Note (Signed)
Patient ID: Thomas [name], male   DOB: 09/12/1930, 89 y.o.   MRN: [id number] CC: Acute Cholecystitis  History of Present Illness Thomas [name] is a 89 y.o. male with past medical history significant for recent CVA, hypertension hyperlipidemia who we are seen in consultation for acute cholecystitis.  The patient was admitted to the hospital from rehab after having a stroke.  He was in rehab because he had had another stroke at [facility].  On 8 November he underwent a right carotid endarterectomy has been recovering in the hospital since then.  Yesterday he returned to more lethargic.  He had a code stroke called but there was no concern for a new CVA but he did have a CT abdomen pelvis that was concerning for acute cholecystitis.  Of note the patient is on Plavix and aspirin.  On exam today the patient is able to state his name but he is unsure of where he is or why he is here.  He does not know what year it is..  Past Medical History Past Medical History:  Diagnosis Date   Arthritis    Carpal tunnel syndrome on both sides    GERD (gastroesophageal reflux disease)    Hyperlipidemia    Hypertension        Past Surgical History:  Procedure Laterality Date   ENDARTERECTOMY Right 03/19/2023   Procedure: ENDARTERECTOMY CAROTID;  Surgeon: Renford Dills, MD;  Location: [facility] ORS;  Service: Vascular;  Laterality: Right;   INGUINAL HERNIA REPAIR  2001   REPLACEMENT TOTAL KNEE Bilateral     No Known Allergies  Current Facility-Administered Medications  Medication Dose Route Frequency Provider Last Rate Last Admin   acetaminophen (TYLENOL) tablet 325-650 mg  325-650 mg Oral Q4H PRN Renford Dills, MD   650 mg at 04/06/23 0901   Or   acetaminophen (TYLENOL) suppository 325-650 mg  325-650 mg Rectal Q4H PRN Schnier, Latina Craver, MD       alum & mag hydroxide-simeth (MAALOX/MYLANTA) 200-200-20 MG/5ML suspension 15-30 mL  15-30 mL Oral Q2H PRN Schnier, Latina Craver, MD       aspirin chewable tablet 81 mg  81  mg Oral Daily Prince Solian F, RPH   81 mg at 04/12/23 1020   atorvastatin (LIPITOR) tablet 40 mg  40 mg Oral Daily [name], MD   40 mg at 04/12/23 1020   bisacodyl (DULCOLAX) suppository 10 mg  10 mg Rectal Daily PRN Sunnie Nielsen, DO       Chlorhexidine Gluconate Cloth 2 % PADS 6 each  6 each Topical Daily [name], MD   6 each at 04/12/23 1022   clopidogrel (PLAVIX) tablet 75 mg  75 mg Oral Daily [name], MD   75 mg at 04/12/23 1020   docusate sodium (COLACE) capsule 100 mg  100 mg Oral Daily Schnier, Latina Craver, MD   100 mg at 04/11/23 1027   guaiFENesin-dextromethorphan (ROBITUSSIN DM) 100-10 MG/5ML syrup 15 mL  15 mL Oral Q4H PRN Schnier, Latina Craver, MD       hydrALAZINE (APRESOLINE) injection 10 mg  10 mg Intravenous Q6H PRN Schnier, Latina Craver, MD       hydrocortisone cream 1 %   Topical BID [name], MD   Given at 04/12/23 1021   labetalol (NORMODYNE) injection 10 mg  10 mg Intravenous Q10 min PRN Schnier, Latina Craver, MD   10 mg at 04/11/23 1602   metoprolol tartrate (LOPRESSOR) injection 2-5 mg  2-5 mg Intravenous  Q2H PRN Schnier, Latina Craver, MD       ondansetron [facility]) injection 4 mg  4 mg Intravenous Q6H PRN Schnier, Latina Craver, MD   4 mg at 04/11/23 2224   oxyCODONE (Oxy IR/ROXICODONE) immediate release tablet 5-10 mg  5-10 mg Oral Q4H PRN Schnier, Latina Craver, MD   5 mg at 04/07/23 0949   pantoprazole (PROTONIX) EC tablet 40 mg  40 mg Oral QHS Tressie Ellis, RPH   40 mg at 04/11/23 2206   phenol (CHLORASEPTIC) mouth spray 1 spray  1 spray Mouth/Throat PRN Schnier, Latina Craver, MD       piperacillin-tazobactam (ZOSYN) IVPB 3.375 g  3.375 g Intravenous Q8H Hunt, Madison H, RPH 12.5 mL/hr at 04/12/23 1417 3.375 g at 04/12/23 1417   polyethylene glycol (MIRALAX / GLYCOLAX) packet 17 g  17 g Oral Daily Sunnie Nielsen, DO   17 g at 04/12/23 1021   senna (SENOKOT) tablet 17.2 mg  2 tablet Oral QHS PRN Sunnie Nielsen, DO       sertraline (ZOLOFT) tablet 25 mg  25 mg Oral  Daily Schnier, Latina Craver, MD   25 mg at 04/12/23 1020   sodium chloride flush (NS) 0.9 % injection 3 mL  3 mL Intravenous Q12H Iline Oven, MD   3 mL at 04/12/23 1032   sorbitol 70 % solution 30 mL  30 mL Oral Daily PRN Schnier, Latina Craver, MD       tamsulosin [facility]) capsule 0.4 mg  0.4 mg Oral Daily [name], MD   0.4 mg at 04/12/23 1020    Family History Family History  Problem Relation Age of Onset   Cancer Sister        Liver cancer   Melanoma Sister    Cancer Brother        Pancreatic   Cancer Brother        Lung cancer   Heart disease Brother        Social History Social History   Tobacco Use   Smoking status: Never   Smokeless tobacco: Never  Vaping Use   Vaping status: Never Used  Substance Use Topics   Alcohol use: Never   Drug use: Never        ROS Full ROS of systems performed and is otherwise negative there than what is stated in the HPI  Physical Exam Blood pressure (!) 146/84, pulse 86, temperature 98.1 F (36.7 C), resp. rate 19, height 5\' 8"  (1.727 m), weight 73.2 kg, SpO2 95%.  Laying in bed, alert to person only.  Normal work of breathing on room air, abdomen is distended.  He does have some voluntary guarding but when you distract him his abdomen is soft.  He has some tenderness in the right upper quadrant but this is only with pressing very hard.  He has an umbilical hernia. Data Reviewed I reviewed the patient's labs.  He has a leukocytosis that was higher yesterday and now improving.  He has a normal total bilirubin.  I independently reviewed his CT abdomen and pelvis.There is a small amount of thickening of the gallbladder wall with some inflammatory changes in the right upper quadrant.  I have personally reviewed the patient's imaging and medical records.    Assessment  The patient is a 89 year old who has a history of CVA and underwent a right carotid endarterectomy.  This was also a month ago.  Acutely he developed a change in mental  status workup was ordered that  revealed possible acute cholecystitis.  On exam he does have some tenderness in the right upper quadrant and he is slightly distended.  He is on antiplatelet therapy.  Due to the fact that he is on dual platelet antitherapy and his overall functional status I would elect to treat this with antibiotics.  Normally I would say put him n.p.o. given his abdominal pain but he had already eaten today and it did not seem to make his pain worse so we will continue on current diet.  If he has any deterioration clinical status would make n.p.o. and he would potentially need a cholecystostomy tube     Kandis Cocking 04/12/2023, 3:02 PM

## 2023-04-12 NOTE — Progress Notes (Signed)
Mobility Specialist - Progress Note   04/12/23 1100  Mobility  Activity Stood at bedside;Transferred from bed to chair;Dangled on edge of bed  Level of Assistance Maximum assist, patient does 25-49%  Assistive Device Front wheel walker  Distance Ambulated (ft) 4 ft  Activity Response Tolerated fair  $Mobility charge 1 Mobility     Pt lying in bed upon arrival, utilizing RA. Pt pleasantly agreeable to activity. Completed bed mobility with maxA + task initiation. Post lean to the R in sitting; requiring persistent assist to maintain midline balance. Follows cues intermittently. STS with modA and lateral stepping with maxA +2. Manual assist from author to progress LLE forward. RW negotiation. Pt left in chair with alarm set, needs in reach. No complaints.   Filiberto Pinks Mobility Specialist 04/12/23, 12:06 PM

## 2023-04-12 NOTE — Progress Notes (Signed)
PROGRESS NOTE    Thomas [name]   UJW:[id number] DOB: Apr 25, 1931  DOA: 03/11/2023 Date of Service: 04/12/23 which is hospital day 27  PCP: Barbette Reichmann, MD    HPI: Thomas [name] is a 89 y.o. male with medical history significant of hypertension, hyperlipidemia, GERD, recent CVA 3 weeks prior to presentation here, was treated at [facility] and d/c to rehab 1 week prior to presentation here. Was doing well but 1 night before presentation to [facility] family states the patient did not want to eat dinner and started complaining of dizziness. The morning of presentation patient was having trouble with coordination unable to raise himself out of bed. Primary caregiver stated that the patient was discharged on dual antiplatelet therapy aspirin and Plavix and has been compliant with all medications.   Hospital course / significant events:  10/31: admitted to hospitalist service for new CVA  11/05: cardiology clearance for surgery  11/08: R Carotid Endarterectomy  11/11: urology consult for traumatic Foley catheter self-removal, re-placed catheter Intermittent vomiting/nausea w/ negative w/u labs and KUB Placement difficulty 12/01: more lethargic, code stroke but no concern for new acute CVA, workup revealed likely cholecystitis, started Zosyn 12/02: gen surg advises to continue abx and monitor, pt appears improved no plans for surgical intervention, would consider chole drain w/ IR if needed   Consultants:  Neurology Vascular Surgery Cardiology Urology  General Surgery   Procedures/Surgeries: 11/08: R Carotid Endarterectomy       ASSESSMENT & PLAN:   Sepsis d/t Cholecystitis General surgery following Continue Zosyn No procedures planned at this time given clinical improvement   Acute CVA w/ Hx recent previous CVA on Statin + DAPT  Right carotid stenosis S/p right carotid endarterectomy on 11/8 cont ASA cont plavix (resumed on 11/22) cont Lipitor 40   Hematuria 2/2 penile trauma w/  traumatic foley catheter self-removal Chronic Foley d/t BPH w/ urinary retenetion  per nursing, pt was confused post-op and pulled out his Foley.   urology consulted monitor for hematuria Foley removed 11/29 for voiding trial.  Per RN, pt was having difficult voiding 11/30, ICU charge reinserted coude Foley 11/30 cont Flomax (new) outpatient f/u with [facility]    AKI - resolved Monitor BMP Po hydration encouraged Maintain Foley as above    Essential hypertension BP has been low during hospitalization.  Adjust Rx as needed   Hyperlipidemia cont Lipitor   GERD cont PPI   Hospital delirium more confused past few days but better today w/ tx for cholecystitis  supportive care       Normal weight based on BMI: Body mass index is 24.94 kg/m.  Underweight - under 18.5  normal weight - 18.5 to 24.9 overweight - 25 to 29.9 obese - 30 or more   DVT prophylaxis: SCD IV fluids: no continuous IV fluids  Nutrition: dysphagia 3 diet  Central lines / invasive devices: Foley cath   Code Status: DNR ACP documentation reviewed: 04/11/23 and none on file in [facility]  [facility] needs: SNF rehab placement  Barriers to dispo / significant pending items: Placement difficulty - family initially unhapppy w/ facilities offered 11/13, 11/18 insurance auth was pending, somehow got cancelled, still pending as of 12/01             Subjective / Brief ROS:  Patient reports feeling better today Niece at bedside states he seems tired and slower but overall okay  Denies CP/SOB.  Pain controlled.  Denies new weakness.  Tolerating diet and eating more today  Family Communication: niece at bedside on rounds     Objective Findings:  Vitals:   04/11/23 2023 04/11/23 2100 04/12/23 0520 04/12/23 0848  BP: 139/88  (!) 105/59 (!) 146/84  Pulse: 80  87 86  Resp: 16  20 19   Temp: 98.6 F (37 C)  (!) 97.5 F (36.4 C) 98.1 F (36.7 C)  TempSrc:      SpO2: 96%  97% 95%  Weight:   73.2 kg    Height:        Intake/Output Summary (Last 24 hours) at 04/12/2023 1210 Last data filed at 04/12/2023 9604 Gross per 24 hour  Intake 113.68 ml  Output 1900 ml  Net -1786.32 ml   Filed Weights   04/11/23 2100  Weight: 73.2 kg    Examination:  Physical Exam Constitutional:      General: He is not in acute distress.    Appearance: He is not ill-appearing or diaphoretic.  Pulmonary:     Effort: Pulmonary effort is normal.     Breath sounds: Normal breath sounds.  Abdominal:     General: Abdomen is flat. Bowel sounds are decreased. There is no distension.     Palpations: Abdomen is soft.     Tenderness: There is no abdominal tenderness. There is no guarding or rebound.  Skin:    General: Skin is warm and dry.  Neurological:     General: No focal deficit present.     Mental Status: He is alert and oriented to person, place, and time.  Psychiatric:        Mood and Affect: Mood normal.        Behavior: Behavior normal.          Scheduled Medications:   aspirin  81 mg Oral Daily   atorvastatin  40 mg Oral Daily   Chlorhexidine Gluconate Cloth  6 each Topical Daily   clopidogrel  75 mg Oral Daily   docusate sodium  100 mg Oral Daily   hydrocortisone cream   Topical BID   pantoprazole  40 mg Oral QHS   polyethylene glycol  17 g Oral Daily   sertraline  25 mg Oral Daily   sodium chloride flush  3 mL Intravenous Q12H   tamsulosin  0.4 mg Oral Daily    Continuous Infusions:  piperacillin-tazobactam (ZOSYN)  IV 3.375 g (04/12/23 0520)    PRN Medications:  acetaminophen **OR** acetaminophen, alum & mag hydroxide-simeth, bisacodyl, guaiFENesin-dextromethorphan, hydrALAZINE, labetalol, metoprolol tartrate, ondansetron, oxyCODONE, phenol, senna, sorbitol  Antimicrobials from admission:  Anti-infectives (From admission, onward)    Start     Dose/Rate Route Frequency Ordered Stop   04/12/23 0600  piperacillin-tazobactam (ZOSYN) IVPB 3.375 g        3.375  g 12.5 mL/hr over 240 Minutes Intravenous Every 8 hours 04/11/23 2139     04/11/23 2200  piperacillin-tazobactam (ZOSYN) IVPB 3.375 g        3.375 g 12.5 mL/hr over 240 Minutes Intravenous  Once 04/11/23 2103 04/12/23 0215   03/19/23 1730  ceFAZolin (ANCEF) IVPB 2g/100 mL premix        2 g 200 mL/hr over 30 Minutes Intravenous Every 8 hours 03/19/23 1627 03/20/23 0211   03/18/23 2152  ceFAZolin (ANCEF) IVPB 2g/100 mL premix        2 g 200 mL/hr over 30 Minutes Intravenous 30 min pre-op 03/18/23 2152 03/19/23 0813           Data Reviewed:  I have personally reviewed the following.Marland KitchenMarland Kitchen  CBC: Recent Labs  Lab 04/10/23 0443 04/11/23 1913 04/12/23 0638  WBC 11.8* 15.4* 12.2*  HGB 11.5* 12.7* 11.7*  HCT 35.9* 37.7* 35.2*  MCV 93.5 88.5 90.0  PLT 271 288 267   Basic Metabolic Panel: Recent Labs  Lab 04/10/23 0443 04/11/23 0615 04/11/23 1913 04/12/23 0638  NA 137 139 137 138  K 3.8 3.5 3.6 3.7  CL 107 107 105 106  CO2 19* 21* 23 22  GLUCOSE 177* 177* 125* 127*  BUN 20 21 17 17   CREATININE 1.08 0.97 0.82 0.97  CALCIUM 8.8* 9.1 8.9 8.7*  MG 2.2  --   --   --   PHOS 3.0  --   --   --    GFR: Estimated Creatinine Clearance: 49 mL/min (by C-G formula based on SCr of 0.97 mg/dL). Liver Function Tests: Recent Labs  Lab 04/10/23 0443 04/11/23 1913 04/12/23 0638  AST 19 18 15   ALT 14 13 13   ALKPHOS 104 104 87  BILITOT 0.8 1.1 1.1  PROT 7.0 7.4 6.3*  ALBUMIN 3.4* 3.2* 2.8*   No results for input(s): "LIPASE", "AMYLASE" in the last 168 hours. No results for input(s): "AMMONIA" in the last 168 hours. Coagulation Profile: No results for input(s): "INR", "PROTIME" in the last 168 hours. Cardiac Enzymes: No results for input(s): "CKTOTAL", "CKMB", "CKMBINDEX", "TROPONINI" in the last 168 hours. BNP (last 3 results) No results for input(s): "PROBNP" in the last 8760 hours. HbA1C: No results for input(s): "HGBA1C" in the last 72 hours. CBG: Recent Labs  Lab  04/10/23 0434 04/11/23 1806  GLUCAP 171* 120*   Lipid Profile: No results for input(s): "CHOL", "HDL", "LDLCALC", "TRIG", "CHOLHDL", "LDLDIRECT" in the last 72 hours. Thyroid Function Tests: No results for input(s): "TSH", "T4TOTAL", "FREET4", "T3FREE", "THYROIDAB" in the last 72 hours. Anemia Panel: No results for input(s): "VITAMINB12", "FOLATE", "FERRITIN", "TIBC", "IRON", "RETICCTPCT" in the last 72 hours. Most Recent Urinalysis On File:     Component Value Date/Time   COLORURINE YELLOW (A) 04/11/2023 2228   APPEARANCEUR HAZY (A) 04/11/2023 2228   APPEARANCEUR Clear 10/19/2011 0819   LABSPEC 1.020 04/11/2023 2228   LABSPEC 1.016 10/19/2011 0819   PHURINE 6.0 04/11/2023 2228   GLUCOSEU NEGATIVE 04/11/2023 2228   GLUCOSEU Negative 10/19/2011 0819   HGBUR LARGE (A) 04/11/2023 2228   BILIRUBINUR NEGATIVE 04/11/2023 2228   BILIRUBINUR Negative 10/19/2011 0819   KETONESUR NEGATIVE 04/11/2023 2228   PROTEINUR 100 (A) 04/11/2023 2228   NITRITE POSITIVE (A) 04/11/2023 2228   LEUKOCYTESUR MODERATE (A) 04/11/2023 2228   LEUKOCYTESUR Negative 10/19/2011 0819   Sepsis Labs: @LABRCNTIP (procalcitonin:4,lacticidven:4) Microbiology: No results found for this or any previous visit (from the past 240 hour(s)).    Radiology Studies last 3 days: CT ABDOMEN PELVIS WO CONTRAST  Result Date: 04/11/2023 CLINICAL DATA:  Bowel obstruction suspected. Nausea and vomiting. Constipation. EXAM: CT ABDOMEN AND PELVIS WITHOUT CONTRAST TECHNIQUE: Multidetector CT imaging of the abdomen and pelvis was performed following the standard protocol without IV contrast. RADIATION DOSE REDUCTION: This exam was performed according to the departmental dose-optimization program which includes automated exposure control, adjustment of the mA and/or kV according to patient size and/or use of iterative reconstruction technique. COMPARISON:  Abdominal radiographs 04/10/2023.  CT 02/14/2023 FINDINGS: Lower chest: Slight  fibrosis in the lung bases. Cardiac enlargement. Moderate-sized esophageal hiatal hernia. Hepatobiliary: Cholelithiasis with gas-filled stones in the gallbladder. Mild gallbladder wall thickening and stranding may indicate acute cholecystitis in the appropriate clinical setting. No focal liver lesions. Pancreas:  Unremarkable. No pancreatic ductal dilatation or surrounding inflammatory changes. Spleen: Normal in size without focal abnormality. Adrenals/Urinary Tract: No adrenal gland nodules. Bilateral renal cysts. Largest on the left is 7.4 cm in diameter. No change since prior study. No imaging follow-up is indicated. No hydronephrosis or hydroureter. Bladder is decompressed with a Foley catheter in place. Stomach/Bowel: Stomach, small bowel, and colon are not abnormally distended. Stool fills the colon. No wall thickening or inflammatory changes are appreciated. Appendix is not identified. Vascular/Lymphatic: Calcification of the aorta. Infrarenal abdominal aortic aneurysm measuring 4.2 cm AP dimension. No change since prior study. No significant lymphadenopathy. Reproductive: Prostate is unremarkable. Other: No abdominal wall hernia or abnormality. No abdominopelvic ascites. Musculoskeletal: Degenerative changes in the spine. No acute bony abnormalities. IMPRESSION: 1. Cholelithiasis with gallbladder wall thickening and mild stranding. Changes may represent acute cholecystitis in the appropriate clinical setting. 2. Abdominal aortic aneurysm measuring 4.2 cm. Recommend CTA or MRA, as appropriate, in 12 months and referral to vascular specialist. Reference: Journal of Vascular Surgery 67.1 (2018): 2-77. J Am Coll Radiol 2013;10:789-794. 3. Aortic atherosclerosis. 4. Slight fibrosis in the lung bases. Moderate esophageal hiatal hernia. Electronically Signed   By: Burman Nieves M.D.   On: 04/11/2023 19:50   CT HEAD CODE STROKE WO CONTRAST`  Result Date: 04/11/2023 CLINICAL DATA:  Code stroke. Provided  history: Mental status change, unknown cause. EXAM: CT HEAD WITHOUT CONTRAST TECHNIQUE: Contiguous axial images were obtained from the base of the skull through the vertex without intravenous contrast. RADIATION DOSE REDUCTION: This exam was performed according to the departmental dose-optimization program which includes automated exposure control, adjustment of the mA and/or kV according to patient size and/or use of iterative reconstruction technique. COMPARISON:  Head CT 03/11/2023.  Brain MRI 03/11/2023. FINDINGS: Brain: Generalized cerebral atrophy. Prominence of the ventricles and sulci appears commensurate Advanced patchy and confluent hypoattenuation within the cerebral white matter, nonspecific but compatible chronic small vessel ischemic disease. 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 There is no acute intracranial hemorrhage. No demarcated cortical infarct. No extra-axial fluid collection. No evidence of an intracranial mass. No midline shift. Vascular: No hyperdense vessel.  Atherosclerotic calcifications. Skull: No calvarial fracture or aggressive osseous lesion. Sinuses/Orbits: No mass or acute finding within the imaged orbits. Mild bilateral ethmoid sinusitis. ASPECTS [facility] Stroke Program Early CT Score) - Ganglionic level infarction (caudate, lentiform nuclei, internal capsule, insula, M1-M3 cortex): 7 - Supraganglionic infarction (M4-M6 cortex): 3 Total score (0-10 with 10 being normal): 10 (when discounting chronic infarcts). No evidence of an acute intracranial abnormality. These results were communicated to Dr. Otelia Limes at 6:28 pmon 12/1/2024by text page via the [facility] messaging system. IMPRESSION: 1. No evidence of an acute intracranial abnormality. 2. Parenchymal atrophy with advanced chronic small vessel ischemic disease and multiple chronic infarcts as described. 3. Mild bilateral ethmoid sinusitis. Electronically Signed   By: Jackey Loge  D.O.   On: 04/11/2023 18:29   DG Abd 1 View  Result Date: 04/10/2023 CLINICAL DATA:  89 year old male with history of vomiting. EXAM: ABDOMEN - 1 VIEW COMPARISON:  Abdominal radiograph 03/21/2023. FINDINGS: The bowel gas pattern is normal. No radio-opaque calculi or other significant radiographic abnormality are seen. IMPRESSION: Negative. Electronically Signed   By: Trudie Reed M.D.   On: 04/10/2023 06:24         Sunnie Nielsen, DO [facility] 04/12/2023, 12:10 PM    Dictation software may have been used to generate the above note. Typos may occur  and escape review in typed/dictated notes. Please contact Dr Lyn Hollingshead directly for clarity if needed.  Staff may message me via secure chat in Epic  but this may not receive an immediate response,  please page me for urgent matters!  If 7PM-7AM, please contact night coverage [email or web address]

## 2023-04-12 NOTE — Progress Notes (Signed)
Occupational Therapy Treatment Patient Details Name: Thomas [name] MRN: [id number] DOB: 28-Jun-1930 Today's Date: 04/12/2023   History of present illness Pt is a 89 y.o. male presenting to hospital 03/11/23 with generalized weakness, dizziness, and being off balance; came home from [facility] rehab 7 days prior after multifocal small vessel infarcts.  Imaging showing acute infarct of R anterior thalamus.  Pt admitted with acute CVA.  S/p 03/19/23 R CEA (PT/OT orders discontinued 03/19/23). Pt also noted with hematuria and acute urinary retention.  New PT/OT orders received 03/29/23.  PMH includes recent stroke 3 weeks ago, htn, HLD.   OT comments  Pt due for goals to be updated-he continues to make progress towards goals. However, remains with increased weakness and need for increased cueing and time for performance of tasks. Pt is supine in bed on arrival. Easily arousable and agreeable to OT session. He denies pain. Required max cueing and increased time, but pt able to reach seated EOB with Min A for BLE initiation; Mod A for BLE management to return to bed. Pt able to long sit in bed with no back support x3 during session. He needed Mod A to forward scoot to EOB for proper stand position. X3 STS trials attempted, with first attempt bed at lowest height and pt unable to stand. Bed height elevated and x2 STS performed with Mod A to RW. Max A needed to maintain balance and perform small lateral steps/slides on floor with max cueing for sequencing, walker management and increased time. Pt fatigues easily with activity. Max A to scoot to [facility] in seated position. Rolling trials in bed performed with Mod A to replace soiled linens and chuck pad. Able to grasp bed rails with cueing and hand placement by therapist to scoot to [facility] with bed in trendelenburg position with SUP. Pt left seated upright in bed with breakfast tray and daughter present. All needs in place and he will cont to require skilled acute OT services to  maximize his safety and IND to return to PLOF.       If plan is discharge home, recommend the following:  Two people to help with walking and/or transfers;A lot of help with bathing/dressing/bathroom;Assistance with cooking/housework;Assist for transportation;Help with stairs or ramp for entrance;Supervision due to cognitive status;Direct supervision/assist for medications management;Direct supervision/assist for financial management   Equipment Recommendations  Other (comment) (defer)    Recommendations for Other Services      Precautions / Restrictions Precautions Precautions: Fall Precaution Comments: L lat tibial wound Restrictions Weight Bearing Restrictions: No       Mobility Bed Mobility Overal bed mobility: Needs Assistance Bed Mobility: Sit to Supine, Supine to Sit     Supine to sit: Min assist, HOB elevated, Used rails Sit to supine: Mod assist, HOB elevated, Used rails   General bed mobility comments: max cueing and increased time, but pt able to reach seated EOB with Min A for BLE initiation; Mod A for BLE management to return to bed; pt able to long sit in bed with no back support x3 during session; Max A to scoot to [facility] in seated position    Transfers Overall transfer level: Needs assistance Equipment used: Rolling walker (2 wheels) Transfers: Sit to/from Stand Sit to Stand: From elevated surface, Mod assist           General transfer comment: ttempted x1 from lowest bed height and unable to reach stand, then Mod A with bed height elevated x2 STS and lateral steps to [facility]  via sliding LEs with max cueing for steps, sequencing, and Max A to manage RW     Balance Overall balance assessment: Needs assistance Sitting-balance support: No upper extremity supported, Feet supported Sitting balance-[name]: Good Sitting balance - Comments: able to sit at EOB with no LOB   Standing balance support: Bilateral upper extremity supported, Reliant on assistive  device for balance Standing balance-[name]: Poor Standing balance comment: posterior lean noted in standing with Max A to maintain standing and lateral step                           ADL either performed or assessed with clinical judgement   ADL Overall ADL's : Needs assistance/impaired                                       General ADL Comments: Max A x1 for standing trials at EOB and lateral steps towards [facility]    Extremity/Trunk Assessment Upper Extremity Assessment Upper Extremity Assessment: Generalized weakness   Lower Extremity Assessment Lower Extremity Assessment: Generalized weakness        Vision       Perception     Praxis      Cognition Arousal: Alert Behavior During Therapy: WFL for tasks assessed/performed                     Orientation Level: Person, Place     Following Commands: Follows one step commands with increased time       General Comments: Follows one -step commands with extra time. Very pleasant, not saying much, agreeable. Oriented to person, location, and his daughter. Unable to recall month or year.        Exercises Other Exercises Other Exercises: Edu in importance of daily movement/exercise to maximize strength and prevent further decline/weakness.    Shoulder Instructions       General Comments      Pertinent Vitals/ Pain       Pain Assessment Pain Assessment: No/denies pain  Home Living                                          Prior Functioning/Environment              Frequency  Min 1X/week        Progress Toward Goals  OT Goals(current goals can now be found in the care plan section)  Progress towards OT goals: Progressing toward goals  Acute Rehab OT Goals Patient Stated Goal: improve strength OT Goal Formulation: With patient/family Time For Goal Achievement: 04/26/23 Potential to Achieve Goals: Good  Plan      Co-evaluation                  AM-PAC OT "6 Clicks" Daily Activity     Outcome Measure   Help from another person eating meals?: A Little Help from another person taking care of personal grooming?: A Little Help from another person toileting, which includes using toliet, bedpan, or urinal?: Total Help from another person bathing (including washing, rinsing, drying)?: A Lot Help from another person to put on and taking off regular upper body clothing?: A Little Help from another person to put on and taking off regular lower body clothing?:  Total 6 Click Score: 13    End of Session Equipment Utilized During Treatment: Gait belt;Rolling walker (2 wheels)  OT Visit Diagnosis: Unsteadiness on feet (R26.81);Other abnormalities of gait and mobility (R26.89);Repeated falls (R29.6);Muscle weakness (generalized) (M62.81);History of falling (Z91.81)   Activity Tolerance Patient tolerated treatment well   Patient Left with call bell/phone within reach;in bed;with bed alarm set;with family/visitor present   Nurse Communication Mobility status        Time: 5284-1324 OT Time Calculation (min): 39 min  Charges: OT General Charges $OT Visit: 1 Visit OT Treatments $Therapeutic Activity: 38-52 mins  Di Jasmer, OTR/L  04/12/23, 10:40 AM   Constance Goltz 04/12/2023, 10:37 AM

## 2023-04-13 DIAGNOSIS — K810 Acute cholecystitis: Secondary | ICD-10-CM | POA: Diagnosis not present

## 2023-04-13 LAB — BASIC METABOLIC PANEL
Anion gap: 8 (ref 5–15)
BUN: 14 mg/dL (ref 8–23)
CO2: 24 mmol/L (ref 22–32)
Calcium: 8.9 mg/dL (ref 8.9–10.3)
Chloride: 105 mmol/L (ref 98–111)
Creatinine, Ser: 0.81 mg/dL (ref 0.61–1.24)
GFR, Estimated: >60 mL/min (ref >60)
Glucose, Bld: 125 mg/dL — ABNORMAL HIGH (ref 70–99)
Potassium: 3.4 mmol/L — ABNORMAL LOW (ref 3.5–5.1)
Sodium: 137 mmol/L (ref 135–145)

## 2023-04-13 LAB — CBC
HCT: 37.2 % — ABNORMAL LOW (ref 39.0–52.0)
Hemoglobin: 12.3 g/dL — ABNORMAL LOW (ref 13.0–17.0)
MCH: 29.9 pg (ref 26.0–34.0)
MCHC: 33.1 g/dL (ref 30.0–36.0)
MCV: 90.5 fL (ref 80.0–100.0)
Platelets: 292 10*3/uL (ref 150–400)
RBC: 4.11 MIL/uL — ABNORMAL LOW (ref 4.22–5.81)
RDW: 13.4 % (ref 11.5–15.5)
WBC: 13.2 10*3/uL — ABNORMAL HIGH (ref 4.0–10.5)
nRBC: 0 % (ref 0.0–0.2)

## 2023-04-13 MED ORDER — POTASSIUM CHLORIDE CRYS ER 20 MEQ PO TBCR
20.0000 meq | EXTENDED_RELEASE_TABLET | Freq: Once | ORAL | Status: AC
  Administered 2023-04-13: 20 meq via ORAL
  Filled 2023-04-13: qty 1

## 2023-04-13 MED ORDER — DOCUSATE SODIUM 50 MG/5ML PO LIQD
100.0000 mg | Freq: Every day | ORAL | Status: DC
  Administered 2023-04-13 – 2023-04-18 (×6): 100 mg via ORAL
  Filled 2023-04-13 (×6): qty 10

## 2023-04-13 NOTE — Plan of Care (Signed)
  Problem: Education: Goal: Knowledge of disease or condition will improve Outcome: Progressing Goal: Knowledge of secondary prevention will improve (MUST DOCUMENT ALL) Outcome: Progressing Goal: Knowledge of patient specific risk factors will improve Loraine Leriche N/A or DELETE if not current risk factor) Outcome: Progressing   Problem: Ischemic Stroke/TIA Tissue Perfusion: Goal: Complications of ischemic stroke/TIA will be minimized Outcome: Progressing   Problem: Coping: Goal: Will verbalize positive feelings about self Outcome: Progressing Goal: Will identify appropriate support needs Outcome: Progressing   Problem: Health Behavior/Discharge Planning: Goal: Ability to manage health-related needs will improve Outcome: Progressing Goal: Goals will be collaboratively established with patient/family Outcome: Progressing   Problem: Self-Care: Goal: Ability to participate in self-care as condition permits will improve Outcome: Progressing Goal: Verbalization of feelings and concerns over difficulty with self-care will improve Outcome: Progressing Goal: Ability to communicate needs accurately will improve Outcome: Progressing   Problem: Nutrition: Goal: Risk of aspiration will decrease Outcome: Progressing Goal: Dietary intake will improve Outcome: Progressing   Problem: Education: Goal: Knowledge of General Education information will improve Description: Including pain rating scale, medication(s)/side effects and non-pharmacologic comfort measures Outcome: Progressing   Problem: Health Behavior/Discharge Planning: Goal: Ability to manage health-related needs will improve Outcome: Progressing   Problem: Clinical Measurements: Goal: Ability to maintain clinical measurements within normal limits will improve Outcome: Progressing Goal: Will remain free from infection Outcome: Progressing Goal: Diagnostic test results will improve Outcome: Progressing Goal: Respiratory  complications will improve Outcome: Progressing Goal: Cardiovascular complication will be avoided Outcome: Progressing   Problem: Activity: Goal: Risk for activity intolerance will decrease Outcome: Progressing   Problem: Nutrition: Goal: Adequate nutrition will be maintained Outcome: Progressing   Problem: Coping: Goal: Level of anxiety will decrease Outcome: Progressing   Problem: Elimination: Goal: Will not experience complications related to bowel motility Outcome: Progressing Goal: Will not experience complications related to urinary retention Outcome: Progressing   Problem: Pain Management: Goal: General experience of comfort will improve Outcome: Progressing   Problem: Safety: Goal: Ability to remain free from injury will improve Outcome: Progressing   Problem: Skin Integrity: Goal: Risk for impaired skin integrity will decrease Outcome: Progressing   Problem: Education: Goal: Knowledge of discharge needs will improve Outcome: Progressing   Problem: Clinical Measurements: Goal: Postoperative complications will be avoided or minimized Outcome: Progressing   Problem: Respiratory: Goal: Ability to achieve and maintain a regular respiratory rate will improve Outcome: Progressing   Problem: Skin Integrity: Goal: Demonstration of wound healing without infection will improve Outcome: Progressing

## 2023-04-13 NOTE — Progress Notes (Signed)
PROGRESS NOTE    Thomas [name]   WUJ:[id number] DOB: 29-Jan-1931  DOA: 03/11/2023 Date of Service: 04/13/23 which is hospital day 28  PCP: Barbette Reichmann, MD    HPI: Thomas [name] is a 89 y.o. male with medical history significant of hypertension, hyperlipidemia, GERD, recent CVA 3 weeks prior to presentation here, was treated at [facility] and d/c to rehab 1 week prior to presentation here. Was doing well but 1 night before presentation to [facility] family states the patient did not want to eat dinner and started complaining of dizziness. The morning of presentation patient was having trouble with coordination unable to raise himself out of bed. Primary caregiver stated that the patient was discharged on dual antiplatelet therapy aspirin and Plavix and has been compliant with all medications.   Hospital course / significant events:  10/31: admitted to hospitalist service for new CVA  11/05: cardiology clearance for surgery  11/08: R Carotid Endarterectomy  11/11: urology consult for traumatic Foley catheter self-removal, re-placed catheter Intermittent vomiting/nausea w/ negative w/u labs and KUB Placement difficulty 12/01-12/02: more lethargic, (+)SIRS, code stroke but no concern for new acute CVA, workup revealed likely cholecystitis, started Zosyn, gen surg advises to continue abx and monitor, pt appears improved no plans for surgical intervention, would consider chole drain w/ IR if needed    Consultants:  Neurology Vascular Surgery Cardiology Urology  General Surgery   Procedures/Surgeries: 11/08: R Carotid Endarterectomy       ASSESSMENT & PLAN:   Sepsis d/t Cholecystitis - sepsis resolved General surgery following Continue Zosyn No procedures planned at this time given clinical improvement but if deterioration would opt for cholecystostomy tube over surgery if possible given overall functional status and given DAPT   Acute CVA w/ Hx recent previous CVA on Statin + DAPT   Right carotid stenosis S/p right carotid endarterectomy on 11/8 cont ASA cont plavix (resumed on 11/22) cont Lipitor 40   Hematuria 2/2 penile trauma w/ traumatic foley catheter self-removal Chronic Foley d/t BPH w/ urinary retenetion  per nursing, pt was confused post-op and pulled out his Foley.   urology consulted monitor for hematuria Foley removed 11/29 for voiding trial.  Per RN, pt was having difficult voiding 11/30, ICU charge reinserted coude Foley 11/30 cont Flomax (new) outpatient f/u with [facility]    AKI - resolved Monitor BMP Po hydration encouraged Maintain Foley as above    Essential hypertension BP has been low during hospitalization.  Adjust Rx as needed   Hyperlipidemia cont Lipitor   GERD cont PPI   Hospital delirium more confused past few days but better today w/ tx for cholecystitis  supportive care   Hypokalemia Replace as needed Monitor BMP  Constipation Early satiety likely d/t constipation Ambulate as able, po hydration encouraged Pt states not able to take powders very well Bowel regimen to include liquid colace, dulcolax suppository / enema prn     Normal weight based on BMI: Body mass index is 24.94 kg/m.  Underweight - under 18.5  normal weight - 18.5 to 24.9 overweight - 25 to 29.9 obese - 30 or more   DVT prophylaxis: SCD IV fluids: no continuous IV fluids  Nutrition: dysphagia 3 diet  Central lines / invasive devices: Foley cath   Code Status: DNR ACP documentation reviewed: 04/11/23 and none on file in [facility]  [facility] needs: SNF rehab placement  Barriers to dispo / significant pending items: Placement difficulty - family initially unhapppy w/ facilities offered 11/13, 11/18 insurance  [facility] was pending, somehow got cancelled, still pending as of 12/01. Now w/ cholecystitis but if remians stable next couple days may be appropriate for discharge              Subjective / Brief ROS:  Patient reports  feeling full and not eating much, not painful to eat, no choking  Denies CP/SOB.  Pain controlled.  Denies new weakness.  Tolerating diet and eating more today    Family Communication: family at bedside on rounds today     Objective Findings:  Vitals:   04/12/23 1709 04/12/23 2055 04/13/23 0511 04/13/23 1004  BP: (!) 157/89 (!) 166/108 (!) 154/85 (!) 147/87  Pulse: 65 83 87 95  Resp: 18   15  Temp: 98.2 F (36.8 C) 98.1 F (36.7 C) 98.8 F (37.1 C) 97.9 F (36.6 C)  TempSrc: Oral     SpO2: 97% 96% 97% 97%  Weight:      Height:        Intake/Output Summary (Last 24 hours) at 04/13/2023 1343 Last data filed at 04/13/2023 0500 Gross per 24 hour  Intake 100 ml  Output 1400 ml  Net -1300 ml   Filed Weights   04/11/23 2100  Weight: 73.2 kg    Examination:  Physical Exam Constitutional:      General: He is not in acute distress.    Appearance: He is not ill-appearing or diaphoretic.  Pulmonary:     Effort: Pulmonary effort is normal.     Breath sounds: Normal breath sounds.  Abdominal:     General: Abdomen is flat. Bowel sounds are decreased. There is no distension.     Palpations: Abdomen is soft.     Tenderness: There is no abdominal tenderness. There is no guarding or rebound.  Skin:    General: Skin is warm and dry.  Neurological:     General: No focal deficit present.     Mental Status: He is alert and oriented to person, place, and time.  Psychiatric:        Mood and Affect: Mood normal.        Behavior: Behavior normal.          Scheduled Medications:   aspirin  81 mg Oral Daily   atorvastatin  40 mg Oral Daily   Chlorhexidine Gluconate Cloth  6 each Topical Daily   clopidogrel  75 mg Oral Daily   docusate  100 mg Oral Daily   hydrocortisone cream   Topical BID   pantoprazole  40 mg Oral QHS   polyethylene glycol  17 g Oral Daily   sertraline  25 mg Oral Daily   sodium chloride flush  3 mL Intravenous Q12H   tamsulosin  0.4 mg Oral Daily     Continuous Infusions:  piperacillin-tazobactam (ZOSYN)  IV 3.375 g (04/13/23 0457)    PRN Medications:  acetaminophen **OR** acetaminophen, alum & mag hydroxide-simeth, bisacodyl, guaiFENesin-dextromethorphan, hydrALAZINE, metoprolol tartrate, ondansetron, oxyCODONE, phenol, senna, sorbitol  Antimicrobials from admission:  Anti-infectives (From admission, onward)    Start     Dose/Rate Route Frequency Ordered Stop   04/12/23 0600  piperacillin-tazobactam (ZOSYN) IVPB 3.375 g        3.375 g 12.5 mL/hr over 240 Minutes Intravenous Every 8 hours 04/11/23 2139     04/11/23 2200  piperacillin-tazobactam (ZOSYN) IVPB 3.375 g        3.375 g 12.5 mL/hr over 240 Minutes Intravenous  Once 04/11/23 2103 04/12/23 0215   03/19/23 1730  ceFAZolin (ANCEF) IVPB 2g/100 mL premix        2 g 200 mL/hr over 30 Minutes Intravenous Every 8 hours 03/19/23 1627 03/20/23 0211   03/18/23 2152  ceFAZolin (ANCEF) IVPB 2g/100 mL premix        2 g 200 mL/hr over 30 Minutes Intravenous 30 min pre-op 03/18/23 2152 03/19/23 0813           Data Reviewed:  I have personally reviewed the following...  CBC: Recent Labs  Lab 04/10/23 0443 04/11/23 1913 04/12/23 0638 04/13/23 0427  WBC 11.8* 15.4* 12.2* 13.2*  HGB 11.5* 12.7* 11.7* 12.3*  HCT 35.9* 37.7* 35.2* 37.2*  MCV 93.5 88.5 90.0 90.5  PLT 271 288 267 292   Basic Metabolic Panel: Recent Labs  Lab 04/10/23 0443 04/11/23 0615 04/11/23 1913 04/12/23 0638 04/13/23 0427  NA 137 139 137 138 137  K 3.8 3.5 3.6 3.7 3.4*  CL 107 107 105 106 105  CO2 19* 21* 23 22 24   GLUCOSE 177* 177* 125* 127* 125*  BUN 20 21 17 17 14   CREATININE 1.08 0.97 0.82 0.97 0.81  CALCIUM 8.8* 9.1 8.9 8.7* 8.9  MG 2.2  --   --   --   --   PHOS 3.0  --   --   --   --    GFR: Estimated Creatinine Clearance: 58.6 mL/min (by C-G formula based on SCr of 0.81 mg/dL). Liver Function Tests: Recent Labs  Lab 04/10/23 0443 04/11/23 1913 04/12/23 0638  AST 19 18  15   ALT 14 13 13   ALKPHOS 104 104 87  BILITOT 0.8 1.1 1.1  PROT 7.0 7.4 6.3*  ALBUMIN 3.4* 3.2* 2.8*   No results for input(s): "LIPASE", "AMYLASE" in the last 168 hours. No results for input(s): "AMMONIA" in the last 168 hours. Coagulation Profile: No results for input(s): "INR", "PROTIME" in the last 168 hours. Cardiac Enzymes: No results for input(s): "CKTOTAL", "CKMB", "CKMBINDEX", "TROPONINI" in the last 168 hours. BNP (last 3 results) No results for input(s): "PROBNP" in the last 8760 hours. HbA1C: No results for input(s): "HGBA1C" in the last 72 hours. CBG: Recent Labs  Lab 04/10/23 0434 04/11/23 1806  GLUCAP 171* 120*   Lipid Profile: No results for input(s): "CHOL", "HDL", "LDLCALC", "TRIG", "CHOLHDL", "LDLDIRECT" in the last 72 hours. Thyroid Function Tests: No results for input(s): "TSH", "T4TOTAL", "FREET4", "T3FREE", "THYROIDAB" in the last 72 hours. Anemia Panel: No results for input(s): "VITAMINB12", "FOLATE", "FERRITIN", "TIBC", "IRON", "RETICCTPCT" in the last 72 hours. Most Recent Urinalysis On File:     Component Value Date/Time   COLORURINE YELLOW (A) 04/11/2023 2228   APPEARANCEUR HAZY (A) 04/11/2023 2228   APPEARANCEUR Clear 10/19/2011 0819   LABSPEC 1.020 04/11/2023 2228   LABSPEC 1.016 10/19/2011 0819   PHURINE 6.0 04/11/2023 2228   GLUCOSEU NEGATIVE 04/11/2023 2228   GLUCOSEU Negative 10/19/2011 0819   HGBUR LARGE (A) 04/11/2023 2228   BILIRUBINUR NEGATIVE 04/11/2023 2228   BILIRUBINUR Negative 10/19/2011 0819   KETONESUR NEGATIVE 04/11/2023 2228   PROTEINUR 100 (A) 04/11/2023 2228   NITRITE POSITIVE (A) 04/11/2023 2228   LEUKOCYTESUR MODERATE (A) 04/11/2023 2228   LEUKOCYTESUR Negative 10/19/2011 0819   Sepsis Labs: @LABRCNTIP (procalcitonin:4,lacticidven:4) Microbiology: No results found for this or any previous visit (from the past 240 hour(s)).    Radiology Studies last 3 days: CT ABDOMEN PELVIS WO CONTRAST  Result Date:  04/11/2023 CLINICAL DATA:  Bowel obstruction suspected. Nausea and vomiting. Constipation. EXAM: CT ABDOMEN AND PELVIS WITHOUT  CONTRAST TECHNIQUE: Multidetector CT imaging of the abdomen and pelvis was performed following the standard protocol without IV contrast. RADIATION DOSE REDUCTION: This exam was performed according to the departmental dose-optimization program which includes automated exposure control, adjustment of the mA and/or kV according to patient size and/or use of iterative reconstruction technique. COMPARISON:  Abdominal radiographs 04/10/2023.  CT 02/14/2023 FINDINGS: Lower chest: Slight fibrosis in the lung bases. Cardiac enlargement. Moderate-sized esophageal hiatal hernia. Hepatobiliary: Cholelithiasis with gas-filled stones in the gallbladder. Mild gallbladder wall thickening and stranding may indicate acute cholecystitis in the appropriate clinical setting. No focal liver lesions. Pancreas: Unremarkable. No pancreatic ductal dilatation or surrounding inflammatory changes. Spleen: Normal in size without focal abnormality. Adrenals/Urinary Tract: No adrenal gland nodules. Bilateral renal cysts. Largest on the left is 7.4 cm in diameter. No change since prior study. No imaging follow-up is indicated. No hydronephrosis or hydroureter. Bladder is decompressed with a Foley catheter in place. Stomach/Bowel: Stomach, small bowel, and colon are not abnormally distended. Stool fills the colon. No wall thickening or inflammatory changes are appreciated. Appendix is not identified. Vascular/Lymphatic: Calcification of the aorta. Infrarenal abdominal aortic aneurysm measuring 4.2 cm AP dimension. No change since prior study. No significant lymphadenopathy. Reproductive: Prostate is unremarkable. Other: No abdominal wall hernia or abnormality. No abdominopelvic ascites. Musculoskeletal: Degenerative changes in the spine. No acute bony abnormalities. IMPRESSION: 1. Cholelithiasis with gallbladder wall  thickening and mild stranding. Changes may represent acute cholecystitis in the appropriate clinical setting. 2. Abdominal aortic aneurysm measuring 4.2 cm. Recommend CTA or MRA, as appropriate, in 12 months and referral to vascular specialist. Reference: Journal of Vascular Surgery 67.1 (2018): 2-77. J Am Coll Radiol 2013;10:789-794. 3. Aortic atherosclerosis. 4. Slight fibrosis in the lung bases. Moderate esophageal hiatal hernia. Electronically Signed   By: Burman Nieves M.D.   On: 04/11/2023 19:50   CT HEAD CODE STROKE WO CONTRAST`  Result Date: 04/11/2023 CLINICAL DATA:  Code stroke. Provided history: Mental status change, unknown cause. EXAM: CT HEAD WITHOUT CONTRAST TECHNIQUE: Contiguous axial images were obtained from the base of the skull through the vertex without intravenous contrast. RADIATION DOSE REDUCTION: This exam was performed according to the departmental dose-optimization program which includes automated exposure control, adjustment of the mA and/or kV according to patient size and/or use of iterative reconstruction technique. COMPARISON:  Head CT 03/11/2023.  Brain MRI 03/11/2023. FINDINGS: Brain: Generalized cerebral atrophy. Prominence of the ventricles and sulci appears commensurate Advanced patchy and confluent hypoattenuation within the cerebral white matter, nonspecific but compatible chronic small vessel ischemic disease. 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 There is no acute intracranial hemorrhage. No demarcated cortical infarct. No extra-axial fluid collection. No evidence of an intracranial mass. No midline shift. Vascular: No hyperdense vessel.  Atherosclerotic calcifications. Skull: No calvarial fracture or aggressive osseous lesion. Sinuses/Orbits: No mass or acute finding within the imaged orbits. Mild bilateral ethmoid sinusitis. ASPECTS [facility] Stroke Program Early CT Score) - Ganglionic level infarction (caudate,  lentiform nuclei, internal capsule, insula, M1-M3 cortex): 7 - Supraganglionic infarction (M4-M6 cortex): 3 Total score (0-10 with 10 being normal): 10 (when discounting chronic infarcts). No evidence of an acute intracranial abnormality. These results were communicated to Dr. Otelia Limes at 6:28 pmon 12/1/2024by text page via the [facility] messaging system. IMPRESSION: 1. No evidence of an acute intracranial abnormality. 2. Parenchymal atrophy with advanced chronic small vessel ischemic disease and multiple chronic infarcts as described. 3. Mild bilateral ethmoid sinusitis. Electronically Signed   By:  Jackey Loge D.O.   On: 04/11/2023 18:29   DG Abd 1 View  Result Date: 04/10/2023 CLINICAL DATA:  89 year old male with history of vomiting. EXAM: ABDOMEN - 1 VIEW COMPARISON:  Abdominal radiograph 03/21/2023. FINDINGS: The bowel gas pattern is normal. No radio-opaque calculi or other significant radiographic abnormality are seen. IMPRESSION: Negative. Electronically Signed   By: Trudie Reed M.D.   On: 04/10/2023 06:24         Sunnie Nielsen, DO [facility] 04/13/2023, 1:43 PM    Dictation software may have been used to generate the above note. Typos may occur and escape review in typed/dictated notes. Please contact Dr Lyn Hollingshead directly for clarity if needed.  Staff may message me via secure chat in Epic  but this may not receive an immediate response,  please page me for urgent matters!  If 7PM-7AM, please contact night coverage [email or web address]

## 2023-04-13 NOTE — Progress Notes (Addendum)
[facility] SURGICAL PROGRESS NOTE (cpt [phone])  Hospital Day(s): 28.   Interval History: Patient seen and examined, no acute events or new complaints overnight. Patient resting in bed. He arouses to verbal stimuli. He is not the most reliable appearing historian. He denied any abdominal pain, nausea, emesis. No fever. Leukocytosis fluctuating; now 13.2K from 12.2 yesterday but peaked at 15.4K 48 hours prior. Hgb to 12.3 is improved. Renal function normal with sCr - 0.81; UO - 1400 ccs. Mild hypokalemia to 3.4. He is on DYS 3 diet. He continues on Zosyn.  Review of Systems:  Constitutional: denies fever, chills  HEENT: denies cough or congestion  Respiratory: denies any shortness of breath  Cardiovascular: denies chest pain or palpitations  Gastrointestinal: denies abdominal pain, N/V Genitourinary: denies burning with urination or urinary frequency Musculoskeletal: denies pain, decreased motor or sensation  Vital signs in last 24 hours: [min-max] current  Temp:  [98.1 F (36.7 C)-98.8 F (37.1 C)] 98.8 F (37.1 C) (12/03 0511) Pulse Rate:  [65-87] 87 (12/03 0511) Resp:  [18] 18 (12/02 1709) BP: (154-166)/(85-108) 154/85 (12/03 0511) SpO2:  [96 %-97 %] 97 % (12/03 0511)     Height: 5\' 8"  (172.7 cm) Weight: 73.2 kg (From 02/19/23)     Intake/Output last 2 shifts:  12/02 0701 - 12/03 0700 In: 100 [IV Piggyback:100] Out: 1400 [Urine:1400]   Physical Exam:  Constitutional: alert, cooperative and no distress  HENT: normocephalic without obvious abnormality  Eyes: PERRL, EOM's grossly intact and symmetric  Respiratory: breathing non-labored at rest  Cardiovascular: regular rate and sinus rhythm  Gastrointestinal: soft, with distraction he does not appear to have any RUQ pain, non-distended, no rebound/guarding  Genitourinary; Foley in place    Labs:     Latest Ref Rng & Units 04/13/2023    4:27 AM 04/12/2023    6:38 AM 04/11/2023    7:13 PM  CBC  WBC 4.0 - 10.5  K/uL 13.2  12.2  15.4   Hemoglobin 13.0 - 17.0 g/dL 60.4  54.0  98.1   Hematocrit 39.0 - 52.0 % 37.2  35.2  37.7   Platelets 150 - 400 K/uL 292  267  288       Latest Ref Rng & Units 04/13/2023    4:27 AM 04/12/2023    6:38 AM 04/11/2023    7:13 PM  CMP  Glucose 70 - 99 mg/dL 191  478  295   BUN 8 - 23 mg/dL 14  17  17    Creatinine 0.61 - 1.24 mg/dL 6.21  3.08  6.57   Sodium 135 - 145 mmol/L 137  138  137   Potassium 3.5 - 5.1 mmol/L 3.4  3.7  3.6   Chloride 98 - 111 mmol/L 105  106  105   CO2 22 - 32 mmol/L 24  22  23    Calcium 8.9 - 10.3 mg/dL 8.9  8.7  8.9   Total Protein 6.5 - 8.1 g/dL  6.3  7.4   Total Bilirubin <1.2 mg/dL  1.1  1.1   Alkaline Phos 38 - 126 U/L  87  104   AST 15 - 41 U/L  15  18   ALT 0 - 44 U/L  13  13      Imaging studies: No new pertinent imaging studies   Assessment/Plan: (ICD-10's: K81.0) 89 y.o. male with clinically improving acute cholecystitis, complicated by recent history of numerous strokes on Plavix    - His leukocytosis remains relatively stable  but fluctuating some, he is afebrile, and does not appear to have RUQ tenderness on examination with distraction. I think for now it is reasonable to continue conservative management understanding that if he deteriorates in any fashion regarding his gallbladder would recommend cholecystostomy tube placement. He is no a surgical candidate given his multiple recent CVAs and need for Plavix.   - Okay to continue DYS diet as tolerated - Continue IV Abx  - Monitor abdominal examination - Monitor leukocytosis; morning labs   - Mobilize as tolerated   - Further management per primary service; we will follow    All of the above findings and recommendations were discussed with the patient, and the medical team, and all of patient's questions were answered to his expressed satisfaction.   -- Lynden Oxford, PA-C [facility] 04/13/2023, 9:03 AM M-F: 7am - 4pm

## 2023-04-13 NOTE — Progress Notes (Signed)
Physical Therapy Treatment Patient Details Name: Thomas [name] MRN: [id number] DOB: 11-Sep-1930 Today's Date: 04/13/2023   History of Present Illness Pt is a 89 y.o. male presenting to hospital 03/11/23 with generalized weakness, dizziness, and being off balance; came home from [facility] rehab 7 days prior after multifocal small vessel infarcts.  Imaging showing acute infarct of R anterior thalamus.  Pt admitted with acute CVA.  S/p 03/19/23 R CEA (PT/OT orders discontinued 03/19/23). Pt also noted with hematuria and acute urinary retention.  New PT/OT orders received 03/29/23.  PMH includes recent stroke 3 weeks ago, htn, HLD.    PT Comments  Patient is agreeable to PT session. Family member at the bedside. Patient required maximal assistance for bed mobility. Two standing bouts performed with significant assistance required to stand. Standing tolerance less than 20 seconds with poor standing balance. Sitting balance is also poor with right lateral and posterior lean. Unable to ambulate at this time. Recommend to continue PT to maximize independence and facilitate return to prior level of function. Rehabilitation <3 hours/day recommended after this hospital stay.    If plan is discharge home, recommend the following: Two people to help with walking and/or transfers;Two people to help with bathing/dressing/bathroom;Assistance with cooking/housework;Assistance with feeding;Direct supervision/assist for medications management;Assist for transportation;Supervision due to cognitive status;Help with stairs or ramp for entrance   Can travel by private vehicle     No  Equipment Recommendations   (to be determined at next level of care)    Recommendations for Other Services       Precautions / Restrictions Precautions Precautions: Fall Restrictions Weight Bearing Restrictions: No     Mobility  Bed Mobility Overal bed mobility: Needs Assistance Bed Mobility: Sit to Supine, Supine to Sit     Supine to  sit: Max assist, HOB elevated, Used rails Sit to supine: Max assist, Used rails, HOB elevated   General bed mobility comments: Max A with increased time and multi modal cues. assist for BLE and trunk support provided    Transfers Overall transfer level: Needs assistance Equipment used: Rolling walker (2 wheels) Transfers: Sit to/from Stand Sit to Stand: From elevated surface, Max assist           General transfer comment: Max A for standing x 2 bouts. maximal cues for technique. posterior bias with all standing efforts    Ambulation/Gait               General Gait Details: unable to at this time due to poor standing balance and limited standing tolerance   Stairs             Wheelchair Mobility     Tilt Bed    Modified Rankin (Stroke Patients Only)       Balance Overall balance assessment: Needs assistance Sitting-balance support: Feet supported, No upper extremity supported Sitting balance-[name]: Poor Sitting balance - Comments: patient had significant right lateral and posterior lean initially with sitting up (which is different from his usual L lateral lean). with faciliation for trunk rotation and cues for midline, patient is able to maintain midline sitting balance for brief periods without support Postural control: Right lateral lean, Posterior lean                                  Cognition Arousal: Alert Behavior During Therapy: WFL for tasks assessed/performed Overall Cognitive Status: History of cognitive impairments - at baseline  Safety/Judgement: Decreased awareness of deficits   Problem Solving: Slow processing, Decreased initiation, Difficulty sequencing, Requires verbal cues, Requires tactile cues General Comments: patient is able to follow single step commands with increased time and multi modal cues        Exercises      General Comments        Pertinent Vitals/Pain  Pain Assessment Pain Assessment: No/denies pain    Home Living                          Prior Function            PT Goals (current goals can now be found in the care plan section) Acute Rehab PT Goals Patient Stated Goal: to improve strength and mobility PT Goal Formulation: With patient Time For Goal Achievement: 04/27/23 Potential to Achieve Goals: Fair Progress towards PT goals: Progressing toward goals (care plan extended x 2 weeks)    Frequency    Min 1X/week      PT Plan      Co-evaluation              AM-PAC PT "6 Clicks" Mobility   Outcome Measure  Help needed turning from your back to your side while in a flat bed without using bedrails?: A Lot Help needed moving from lying on your back to sitting on the side of a flat bed without using bedrails?: A Lot Help needed moving to and from a bed to a chair (including a wheelchair)?: A Lot Help needed standing up from a chair using your arms (e.g., wheelchair or bedside chair)?: Total Help needed to walk in hospital room?: Total Help needed climbing 3-5 steps with a railing? : Total 6 Click Score: 9    End of Session   Activity Tolerance: Patient limited by fatigue Patient left: in bed;with call bell/phone within reach;with bed alarm set;with family/visitor present (son-in-law present)   PT Visit Diagnosis: Unsteadiness on feet (R26.81);Other abnormalities of gait and mobility (R26.89);Repeated falls (R29.6);Muscle weakness (generalized) (M62.81);Hemiplegia and hemiparesis     Time: 1138-1212 PT Time Calculation (min) (ACUTE ONLY): 34 min  Charges:    $Therapeutic Activity: 23-37 mins PT General Charges $$ ACUTE PT VISIT: 1 Visit                     Donna Bernard, PT, MPT    Ina Homes 04/13/2023, 1:14 PM

## 2023-04-13 NOTE — TOC Progression Note (Signed)
Transition of Care [facility]) - Progression Note    Patient Details  Name: Thomas [name] MRN: [id number] Date of Birth: 10/11/1930  Transition of Care [facility]) CM/SW Contact  Allena Katz, LCSW Phone Number: 04/13/2023, 11:56 AM  Clinical Narrative:    [name] reports Berkley Harvey is still pending.     Expected Discharge Plan: Skilled Nursing Facility Barriers to Discharge: Continued Medical Work up  Expected Discharge Plan and Services                                               Social Determinants of Health (SDOH) Interventions SDOH Screenings   Food Insecurity: No Food Insecurity (03/12/2023)  Housing: Low Risk  (03/12/2023)  Transportation Needs: No Transportation Needs (03/12/2023)  Utilities: Not At Risk (03/12/2023)  Financial Resource Strain: Low Risk  (10/12/2022)   Received from [facility]  Tobacco Use: Low Risk  (03/19/2023)  Health Literacy: High Risk (03/12/2023)   Received from [facility]    Readmission Risk Interventions     No data to display

## 2023-04-14 DIAGNOSIS — I639 Cerebral infarction, unspecified: Secondary | ICD-10-CM | POA: Diagnosis not present

## 2023-04-14 DIAGNOSIS — K810 Acute cholecystitis: Secondary | ICD-10-CM | POA: Diagnosis not present

## 2023-04-14 LAB — CBC
HCT: 34.3 % — ABNORMAL LOW (ref 39.0–52.0)
Hemoglobin: 11.1 g/dL — ABNORMAL LOW (ref 13.0–17.0)
MCH: 29.5 pg (ref 26.0–34.0)
MCHC: 32.4 g/dL (ref 30.0–36.0)
MCV: 91.2 fL (ref 80.0–100.0)
Platelets: 285 10*3/uL (ref 150–400)
RBC: 3.76 MIL/uL — ABNORMAL LOW (ref 4.22–5.81)
RDW: 13.6 % (ref 11.5–15.5)
WBC: 11.9 10*3/uL — ABNORMAL HIGH (ref 4.0–10.5)
nRBC: 0 % (ref 0.0–0.2)

## 2023-04-14 LAB — BASIC METABOLIC PANEL
Anion gap: 9 (ref 5–15)
BUN: 24 mg/dL — ABNORMAL HIGH (ref 8–23)
CO2: 24 mmol/L (ref 22–32)
Calcium: 8.4 mg/dL — ABNORMAL LOW (ref 8.9–10.3)
Chloride: 104 mmol/L (ref 98–111)
Creatinine, Ser: 1.18 mg/dL (ref 0.61–1.24)
GFR, Estimated: 59 mL/min — ABNORMAL LOW (ref >60)
Glucose, Bld: 111 mg/dL — ABNORMAL HIGH (ref 70–99)
Potassium: 3.2 mmol/L — ABNORMAL LOW (ref 3.5–5.1)
Sodium: 137 mmol/L (ref 135–145)

## 2023-04-14 LAB — MAGNESIUM: Magnesium: 2.4 mg/dL (ref 1.7–2.4)

## 2023-04-14 LAB — PHOSPHORUS: Phosphorus: 2.9 mg/dL (ref 2.5–4.6)

## 2023-04-14 MED ORDER — BISACODYL 5 MG PO TBEC
10.0000 mg | DELAYED_RELEASE_TABLET | Freq: Every day | ORAL | Status: DC
  Administered 2023-04-14 – 2023-04-18 (×4): 10 mg via ORAL
  Filled 2023-04-14 (×5): qty 2

## 2023-04-14 MED ORDER — POTASSIUM CHLORIDE CRYS ER 20 MEQ PO TBCR
40.0000 meq | EXTENDED_RELEASE_TABLET | Freq: Once | ORAL | Status: AC
  Administered 2023-04-14: 40 meq via ORAL
  Filled 2023-04-14: qty 2

## 2023-04-14 MED ORDER — BISACODYL 10 MG RE SUPP: 10.0000 mg | Freq: Every day | RECTAL | Status: DC | PRN

## 2023-04-14 NOTE — Progress Notes (Signed)
[facility] Progress Note  Patient: Thomas [name]    WJX:[id number]  DOA: 03/11/2023     Date of Service: the patient was seen and examined on 04/14/2023  Chief Complaint  Patient presents with   Weakness   Brief hospital course: Thomas [name] is a 89 y.o. male with medical history significant of hypertension, hyperlipidemia, GERD, recent CVA 3 weeks prior to presentation here, was treated at [facility] and d/c to rehab 1 week prior to presentation here. Was doing well but 1 night before presentation to [facility] family states the patient did not want to eat dinner and started complaining of dizziness. The morning of presentation patient was having trouble with coordination unable to raise himself out of bed. Primary caregiver stated that the patient was discharged on dual antiplatelet therapy aspirin and Plavix and has been compliant with all medications.    Hospital course / significant events:  10/31: admitted to hospitalist service for new CVA  11/05: cardiology clearance for surgery  11/08: R Carotid Endarterectomy  11/11: urology consult for traumatic Foley catheter self-removal, re-placed catheter Intermittent vomiting/nausea w/ negative w/u labs and KUB Placement difficulty 12/01-12/02: more lethargic, (+)SIRS, code stroke but no concern for new acute CVA, workup revealed likely cholecystitis, started Zosyn, gen surg advises to continue abx and monitor, pt appears improved no plans for surgical intervention, would consider chole drain w/ IR if needed      Consultants:  Neurology Vascular Surgery Cardiology Urology  General Surgery    Procedures/Surgeries: 11/08: R Carotid Endarterectomy    Assessment and Plan:  # Sepsis d/t Cholecystitis - sepsis resolved Continue IV Zosyn No procedures planned at this time given clinical improvement but if deterioration would opt for cholecystostomy tube over surgery if possible given overall functional status and given DAPT  General surgery  was consulted, recommended conservative management, signed off.  # Acute CVA w/ Hx recent previous CVA on Statin + DAPT  Right carotid stenosis S/p right carotid endarterectomy on 11/8 cont ASA cont plavix (resumed on 11/22) cont Lipitor 40   # Hematuria 2/2 penile trauma w/ traumatic foley catheter self-removal Chronic Foley d/t BPH w/ urinary retenetion  per nursing, pt was confused post-op and pulled out his Foley.   urology consulted monitor for hematuria Foley removed 11/29 for voiding trial.  Per RN, pt was having difficult voiding 11/30, ICU charge reinserted coude Foley 11/30 cont Flomax (new) outpatient f/u with [facility]    # AKI - resolved Monitor BMP Po hydration encouraged Maintain Foley as above    # Essential hypertension BP has been low during hospitalization.  Adjust Rx as needed   Hyperlipidemia cont Lipitor   GERD cont PPI   Hospital delirium more confused past few days but better today w/ tx for cholecystitis  supportive care   Hypokalemia Replace as needed Monitor BMP   Constipation Early satiety likely d/t constipation Ambulate as able, po hydration encouraged Pt states not able to take powders very well Bowel regimen to include liquid colace, MiraLAX daily, Dulcolax 10 mg p.o. nightly, dulcolax suppository prn        Normal weight based on BMI:   Body mass index is 24.54 kg/m.  Interventions:  Diet: Dysphagia 3 diet DVT Prophylaxis: SCD   Advance goals of care discussion: DNR  Family Communication: family was not present at bedside, at the time of interview.  The pt provided permission to discuss medical plan with the family. Opportunity was given to ask question and all  questions were answered satisfactorily.   Disposition:  Pt is from Home, admitted with CVA, s/p CEA and developed acute cholecystitis, on IV antibiotics, which precludes a safe discharge.  He is gradually getting better, may be stable to discharge in 1 to  2 days. Discharge to SNF, when bed will be available, most likely in 1 to 2 days. Placement difficulty - family initially unhapppy w/ facilities offered 11/13, 11/18 insurance auth was pending, somehow got cancelled, still pending as of 12/01. Now w/ cholecystitis but if remians stable next couple days may be appropriate for discharge   Subjective: No significant events overnight, patient was sitting on the recliner comfortably, denied any active issues, no abdominal pain.  Left-sided weakness is gradually getting better.  No new neurological complaints.  Physical Exam: General: NAD, lying comfortably Appear in no distress, affect appropriate Eyes: PERRLA ENT: Oral Mucosa Clear, moist, s/p Rt CEA, incision healing well Neck: no JVD,  Cardiovascular: S1 and S2 Present, no Murmur,  Respiratory: good respiratory effort, Bilateral Air entry equal and Decreased, no Crackles, no wheezes Abdomen: Bowel Sound present, Soft and no tenderness,  Skin: no rashes Extremities: no Pedal edema, no calf tenderness Neurologic: Right-sided weakness power 3-4/5, CN grossly intact Gait not checked due to patient safety concerns  Vitals:   04/13/23 1712 04/13/23 1958 04/14/23 0310 04/14/23 0809  BP: (!) 146/101 97/72 106/67 118/85  Pulse: 63 96  78  Resp: 18 18 18 17   Temp:  98.6 F (37 C) 98.2 F (36.8 C) 98.2 F (36.8 C)  TempSrc:  Oral Oral   SpO2: 96% 95% 95% 94%  Weight:      Height:       No intake or output data in the 24 hours ending 04/14/23 1550 Filed Weights   04/11/23 2100  Weight: 73.2 kg    Data Reviewed: I have personally reviewed and interpreted daily labs, tele strips, imagings as discussed above. I reviewed all nursing notes, pharmacy notes, vitals, pertinent old records I have discussed plan of care as described above with RN and patient/family.  CBC: Recent Labs  Lab 04/10/23 0443 04/11/23 1913 04/12/23 0638 04/13/23 0427 04/14/23 0438  WBC 11.8* 15.4* 12.2* 13.2*  11.9*  HGB 11.5* 12.7* 11.7* 12.3* 11.1*  HCT 35.9* 37.7* 35.2* 37.2* 34.3*  MCV 93.5 88.5 90.0 90.5 91.2  PLT 271 288 267 292 285   Basic Metabolic Panel: Recent Labs  Lab 04/10/23 0443 04/11/23 0615 04/11/23 1913 04/12/23 0638 04/13/23 0427 04/14/23 0438  NA 137 139 137 138 137 137  K 3.8 3.5 3.6 3.7 3.4* 3.2*  CL 107 107 105 106 105 104  CO2 19* 21* 23 22 24 24   GLUCOSE 177* 177* 125* 127* 125* 111*  BUN 20 21 17 17 14  24*  CREATININE 1.08 0.97 0.82 0.97 0.81 1.18  CALCIUM 8.8* 9.1 8.9 8.7* 8.9 8.4*  MG 2.2  --   --   --   --  2.4  PHOS 3.0  --   --   --   --  2.9    Studies: No results found.  Scheduled Meds:  aspirin  81 mg Oral Daily   atorvastatin  40 mg Oral Daily   Chlorhexidine Gluconate Cloth  6 each Topical Daily   clopidogrel  75 mg Oral Daily   docusate  100 mg Oral Daily   hydrocortisone cream   Topical BID   pantoprazole  40 mg Oral QHS   polyethylene glycol  17 g Oral  Daily   sertraline  25 mg Oral Daily   sodium chloride flush  3 mL Intravenous Q12H   tamsulosin  0.4 mg Oral Daily   Continuous Infusions:  piperacillin-tazobactam (ZOSYN)  IV 3.375 g (04/14/23 1538)   PRN Meds: acetaminophen **OR** acetaminophen, alum & mag hydroxide-simeth, bisacodyl, guaiFENesin-dextromethorphan, hydrALAZINE, metoprolol tartrate, ondansetron, oxyCODONE, phenol, senna, sorbitol  Time spent: 35 minutes  Author: Gillis Santa. MD [facility] 04/14/2023 3:50 PM  To reach On-call, see care teams to locate the attending and reach out to them via [email or web address]. If 7PM-7AM, please contact night-coverage If you still have difficulty reaching the attending provider, please page the [facility] (Director on Call) for [facility] on amion for assistance.

## 2023-04-14 NOTE — Progress Notes (Signed)
Physical Therapy Treatment Patient Details Name: Thomas [name] MRN: [id number] DOB: 1930-06-22 Today's Date: 04/14/2023   History of Present Illness Pt is a 89 y.o. male presenting to hospital 03/11/23 with generalized weakness, dizziness, and being off balance; came home from [facility] rehab 7 days prior after multifocal small vessel infarcts.  Imaging showing acute infarct of R anterior thalamus.  Pt admitted with acute CVA.  S/p 03/19/23 R CEA (PT/OT orders discontinued 03/19/23). Pt also noted with hematuria and acute urinary retention.  New PT/OT orders received 03/29/23.  PMH includes recent stroke 3 weeks ago, htn, HLD.    PT Comments  Patient still sitting in chair from earlier session and requesting to return to bed. Patient required increased assistance with transfers due to fatigue from sitting up for several hours. Max A for sit to stand transfer with rolling walker and Max A for stand pivot transfer without the walker. Max A required for bed mobility and patient was fatigued with activity. Recommend to continue PT to maximize independence and facilitate return to prior level of function. Rehabilitation <3 hours/day recommended after this hospital stay.    If plan is discharge home, recommend the following: Two people to help with walking and/or transfers;Two people to help with bathing/dressing/bathroom;Assistance with cooking/housework;Assistance with feeding;Direct supervision/assist for medications management;Assist for transportation;Supervision due to cognitive status;Help with stairs or ramp for entrance   Can travel by private vehicle     No  Equipment Recommendations   (to bd determined at next level of care)    Recommendations for Other Services       Precautions / Restrictions Precautions Precautions: Fall Restrictions Weight Bearing Restrictions: No     Mobility  Bed Mobility Overal bed mobility: Needs Assistance Bed Mobility: Sit to Supine     Supine to sit: Max  assist Sit to supine: Max assist   General bed mobility comments: assistance for BLE support. verbal cues for technique.    Transfers Overall transfer level: Needs assistance Equipment used: Rolling walker (2 wheels), None Transfers: Sit to/from Stand Sit to Stand: Max assist Stand pivot transfers: Max assist         General transfer comment: one sit to stand bout from chair with maximal assistance required for lifitng to stand. cues for hand placement and anterior weight shifting and patient with more pronounced posterior biasis. stand pivot performed from recliner to bed without the walker with maximal assistance and maximal cues for technique. patient needs increased assistance with fatigue compared to earlier session    Ambulation/Gait               General Gait Details: not attempted at this time   Stairs             Wheelchair Mobility     Tilt Bed    Modified Rankin (Stroke Patients Only)       Balance Overall balance assessment: Needs assistance Sitting-balance support: Feet supported, No upper extremity supported Sitting balance-[name]: Fair Sitting balance - Comments: posterior lean initially and as pt fatigues while seated EOB Postural control: Posterior lean Standing balance support: Bilateral upper extremity supported Standing balance-[name]: Poor Standing balance comment: posterior lean with standing. cues for anterior weight shifting and faciliation provided. standing tolerance of no more than a few seconds                            Cognition Arousal: Alert Behavior During Therapy: [facility] for  tasks assessed/performed Overall Cognitive Status: History of cognitive impairments - at baseline Area of Impairment: Orientation, Safety/judgement, Problem solving                 Orientation Level: Person, Place Current Attention Level: Sustained Memory: Decreased short-term memory Following Commands: Follows one step  commands with increased time Safety/Judgement: Decreased awareness of deficits   Problem Solving: Slow processing, Decreased initiation, Difficulty sequencing, Requires verbal cues, Requires tactile cues General Comments: patient is able to follow single step commands with increased time and multi modal cues        Exercises      General Comments General comments (skin integrity, edema, etc.): patient is much more awake, alert, and interactive today than previous session. he was eating breakfast on arrival to room      Pertinent Vitals/Pain Pain Assessment Pain Assessment: No/denies pain    Home Living                          Prior Function            PT Goals (current goals can now be found in the care plan section) Acute Rehab PT Goals Patient Stated Goal: to improve strength PT Goal Formulation: With patient Time For Goal Achievement: 04/27/23 Potential to Achieve Goals: Fair Progress towards PT goals: Progressing toward goals    Frequency    Min 1X/week      PT Plan      Co-evaluation PT/OT/SLP Co-Evaluation/Treatment: Yes Reason for Co-Treatment: For patient/therapist safety;To address functional/ADL transfers PT goals addressed during session: Mobility/safety with mobility;Balance;Proper use of DME OT goals addressed during session: ADL's and self-care;Strengthening/ROM      AM-PAC PT "6 Clicks" Mobility   Outcome Measure  Help needed turning from your back to your side while in a flat bed without using bedrails?: A Lot Help needed moving from lying on your back to sitting on the side of a flat bed without using bedrails?: A Lot Help needed moving to and from a bed to a chair (including a wheelchair)?: A Lot Help needed standing up from a chair using your arms (e.g., wheelchair or bedside chair)?: Total Help needed to walk in hospital room?: Total Help needed climbing 3-5 steps with a railing? : Total 6 Click Score: 9    End of Session          PT Visit Diagnosis: Unsteadiness on feet (R26.81);Other abnormalities of gait and mobility (R26.89);Repeated falls (R29.6);Muscle weakness (generalized) (M62.81);Hemiplegia and hemiparesis     Time: 4166-0630 PT Time Calculation (min) (ACUTE ONLY): 14 min  Charges:    $Therapeutic Activity: 8-22 mins PT General Charges $$ ACUTE PT VISIT: 1 Visit                     Donna Bernard, PT, MPT    Ina Homes 04/14/2023, 1:45 PM

## 2023-04-14 NOTE — Progress Notes (Signed)
[facility] SURGICAL PROGRESS NOTE (cpt [phone])  Hospital Day(s): 29.   Interval History: Patient seen and examined, no acute events or new complaints overnight. Patient resting in bed. He is much more alert this AM. He denied any abdominal pain, nausea, emesis. No fever. Leukocytosis fluctuating; now improved to 11.9K. Hgb to 11.1. Renal function normal with sCr - 1.18; UO - unmeasured. Mild hypokalemia to 3.2. He is on DYS 3 diet. He continues on Zosyn.  Review of Systems:  Constitutional: denies fever, chills  HEENT: denies cough or congestion  Respiratory: denies any shortness of breath  Cardiovascular: denies chest pain or palpitations  Gastrointestinal: denies abdominal pain, N/V Genitourinary: denies burning with urination or urinary frequency Musculoskeletal: denies pain, decreased motor or sensation  Vital signs in last 24 hours: [min-max] current  Temp:  [97.9 F (36.6 C)-98.6 F (37 C)] 98.2 F (36.8 C) (12/04 0310) Pulse Rate:  [63-96] 96 (12/03 1958) Resp:  [15-18] 18 (12/04 0310) BP: (97-147)/(67-101) 106/67 (12/04 0310) SpO2:  [95 %-97 %] 95 % (12/04 0310)     Height: 5\' 8"  (172.7 cm) Weight: 73.2 kg (From 02/19/23)     Intake/Output last 2 shifts:  No intake/output data recorded.   Physical Exam:  Constitutional: alert, cooperative and no distress  HENT: normocephalic without obvious abnormality  Eyes: PERRL, EOM's grossly intact and symmetric  Respiratory: breathing non-labored at rest  Cardiovascular: regular rate and sinus rhythm  Gastrointestinal: soft, his abdomen is non-tender this morning, non-distended, no rebound/guarding  Genitourinary; Foley in place    Labs:     Latest Ref Rng & Units 04/14/2023    4:38 AM 04/13/2023    4:27 AM 04/12/2023    6:38 AM  CBC  WBC 4.0 - 10.5 K/uL 11.9  13.2  12.2   Hemoglobin 13.0 - 17.0 g/dL 62.1  30.8  65.7   Hematocrit 39.0 - 52.0 % 34.3  37.2  35.2   Platelets 150 - 400 K/uL 285  292  267        Latest Ref Rng & Units 04/14/2023    4:38 AM 04/13/2023    4:27 AM 04/12/2023    6:38 AM  CMP  Glucose 70 - 99 mg/dL 846  962  952   BUN 8 - 23 mg/dL 24  14  17    Creatinine 0.61 - 1.24 mg/dL 8.41  3.24  4.01   Sodium 135 - 145 mmol/L 137  137  138   Potassium 3.5 - 5.1 mmol/L 3.2  3.4  3.7   Chloride 98 - 111 mmol/L 104  105  106   CO2 22 - 32 mmol/L 24  24  22    Calcium 8.9 - 10.3 mg/dL 8.4  8.9  8.7   Total Protein 6.5 - 8.1 g/dL   6.3   Total Bilirubin <1.2 mg/dL   1.1   Alkaline Phos 38 - 126 U/L   87   AST 15 - 41 U/L   15   ALT 0 - 44 U/L   13      Imaging studies: No new pertinent imaging studies   Assessment/Plan: (ICD-10's: K81.0) 89 y.o. male with clinically improving acute cholecystitis, complicated by recent history of numerous strokes on Plavix    - Patient with significant improvement this AM, no significant pain, leukocytosis is improved. I think he is responding appropriately to conservative measure. No need for percutaneous cholecystostomy tube unless he were to deteriorate. He is no a surgical candidate given his  multiple recent CVAs and need for Plavix.   - Okay to continue DYS diet as tolerated - Continue IV Abx; complete 10 day course (PO + IV) - Monitor abdominal examination - Monitor leukocytosis; improved  - Mobilize as tolerated   - Further management per primary service   - Discharge Planning; From a gallbladder perspective, he has responded well to Abx. Nothing further from our perspective at this time. Abx as above. We will remain available should clinical condition change.   All of the above findings and recommendations were discussed with the patient, and the medical team, and all of patient's questions were answered to his expressed satisfaction.   -- Lynden Oxford, PA-C [facility] 04/14/2023, 7:23 AM M-F: 7am - 4pm

## 2023-04-14 NOTE — Progress Notes (Signed)
Occupational Therapy Treatment Patient Details Name: Thomas [name] MRN: [id number] DOB: 05/23/1930 Today's Date: 04/14/2023   History of present illness Pt is a 89 y.o. male presenting to hospital 03/11/23 with generalized weakness, dizziness, and being off balance; came home from [facility] rehab 7 days prior after multifocal small vessel infarcts.  Imaging showing acute infarct of R anterior thalamus.  Pt admitted with acute CVA.  S/p 03/19/23 R CEA (PT/OT orders discontinued 03/19/23). Pt also noted with hematuria and acute urinary retention.  New PT/OT orders received 03/29/23.  PMH includes recent stroke 3 weeks ago, htn, HLD.   OT comments  Pt is seated on EOB on arrival with PT present preparing for use of stedy. He denies pain. Pt noted with posterior lean with seated balance at EOB when fatigued requiring cueing and assist for anterior weight shift. He performed STS from EOB to stedy needing cueing for hand/feet placement and Min A x2. Taken to the sink to perform face washing with SBA while on stedy then over to recliner. Standing at recliner with Min A while OT performed posterior hygiene with Max A. Pt reports fatigue in BLEs from standing on stedy. Continues to require increased cueing and time for processing tasks/instructions. L hand fisted requiring manual stretching/opening by PT to place on stedy. Left seated in recliner with all needs in place and will cont to require skilled acute OT services to maximize his safety and IND to return to PLOF.       If plan is discharge home, recommend the following:  Two people to help with walking and/or transfers;A lot of help with bathing/dressing/bathroom;Assistance with cooking/housework;Assist for transportation;Help with stairs or ramp for entrance;Supervision due to cognitive status;Direct supervision/assist for medications management;Direct supervision/assist for financial management   Equipment Recommendations  Other (comment) (defer)     Recommendations for Other Services      Precautions / Restrictions Precautions Precautions: Fall Restrictions Weight Bearing Restrictions: No       Mobility Bed Mobility               General bed mobility comments: seated EOB with PT on arrival    Transfers Overall transfer level: Needs assistance Equipment used: Ambulation equipment used (stedy) Transfers: Sit to/from Stand Sit to Stand: From elevated surface, +2 safety/equipment, Min assist           General transfer comment: Min A +2 for standing from bed. 2 standing trials from steady with Min A, cues for technique and positioning of LEs; nervous initially Transfer via Lift Equipment: Stedy   Balance Overall balance assessment: Needs assistance Sitting-balance support: Feet supported, No upper extremity supported Sitting balance-[name]: Poor Sitting balance - Comments: posterior lean initially and as pt fatigues while seated EOB Postural control: Posterior lean Standing balance support: Bilateral upper extremity supported, Reliant on assistive device for balance Standing balance-[name]: Poor                             ADL either performed or assessed with clinical judgement   ADL Overall ADL's : Needs assistance/impaired     Grooming: Wash/dry face;Sitting;Standing;Cueing for sequencing (on stedy at sink with SUP/SBA)                       Toileting- Clothing Manipulation and Hygiene: Maximal assistance;Sit to/from stand Toileting - Clothing Manipulation Details (indicate cue type and reason): Max A to wipe buttocks from minimal BM  while standing on stedy with PT            Extremity/Trunk Assessment Upper Extremity Assessment Upper Extremity Assessment: Generalized weakness   Lower Extremity Assessment Lower Extremity Assessment: Generalized weakness        Vision       Perception     Praxis      Cognition Arousal: Alert Behavior During Therapy: WFL  for tasks assessed/performed Overall Cognitive Status: History of cognitive impairments - at baseline Area of Impairment: Orientation, Safety/judgement, Problem solving                 Orientation Level: Person, Place     Following Commands: Follows one step commands with increased time Safety/Judgement: Decreased awareness of deficits   Problem Solving: Slow processing, Decreased initiation, Difficulty sequencing, Requires verbal cues, Requires tactile cues General Comments: patient is able to follow single step commands with increased time and multi modal cues        Exercises      Shoulder Instructions       General Comments patient is much more awake, alert, and interactive today than previous session. he was eating breakfast on arrival to room    Pertinent Vitals/ Pain       Pain Assessment Pain Assessment: No/denies pain  Home Living                                          Prior Functioning/Environment              Frequency  Min 1X/week        Progress Toward Goals  OT Goals(current goals can now be found in the care plan section)  Progress towards OT goals: Progressing toward goals  Acute Rehab OT Goals Patient Stated Goal: improve strength OT Goal Formulation: With patient/family Time For Goal Achievement: 04/26/23 Potential to Achieve Goals: Good  Plan      Co-evaluation    PT/OT/SLP Co-Evaluation/Treatment: Yes Reason for Co-Treatment: For patient/therapist safety;To address functional/ADL transfers PT goals addressed during session: Mobility/safety with mobility;Balance;Proper use of DME OT goals addressed during session: ADL's and self-care;Strengthening/ROM      AM-PAC OT "6 Clicks" Daily Activity     Outcome Measure   Help from another person eating meals?: A Little Help from another person taking care of personal grooming?: A Little Help from another person toileting, which includes using toliet, bedpan,  or urinal?: Total Help from another person bathing (including washing, rinsing, drying)?: A Lot Help from another person to put on and taking off regular upper body clothing?: A Little Help from another person to put on and taking off regular lower body clothing?: Total 6 Click Score: 13    End of Session Equipment Utilized During Treatment: Gait belt;Rolling walker (2 wheels)  OT Visit Diagnosis: Unsteadiness on feet (R26.81);Other abnormalities of gait and mobility (R26.89);Repeated falls (R29.6);Muscle weakness (generalized) (M62.81);History of falling (Z91.81)   Activity Tolerance Patient tolerated treatment well   Patient Left with call bell/phone within reach;in chair;with chair alarm set   Nurse Communication Mobility status        Time: 9518-8416 OT Time Calculation (min): 17 min  Charges: OT General Charges $OT Visit: 1 Visit OT Treatments $Self Care/Home Management : 8-22 mins  Titiana Severa, OTR/L  04/14/23, 11:14 AM   Caylor Tallarico E Jolissa Kapral 04/14/2023, 11:12 AM

## 2023-04-14 NOTE — Plan of Care (Signed)
  Problem: Education: Goal: Knowledge of disease or condition will improve Outcome: Progressing Goal: Knowledge of secondary prevention will improve (MUST DOCUMENT ALL) Outcome: Progressing Goal: Knowledge of patient specific risk factors will improve Loraine Leriche N/A or DELETE if not current risk factor) Outcome: Progressing   Problem: Ischemic Stroke/TIA Tissue Perfusion: Goal: Complications of ischemic stroke/TIA will be minimized Outcome: Progressing   Problem: Coping: Goal: Will verbalize positive feelings about self Outcome: Progressing Goal: Will identify appropriate support needs Outcome: Progressing   Problem: Health Behavior/Discharge Planning: Goal: Ability to manage health-related needs will improve Outcome: Progressing Goal: Goals will be collaboratively established with patient/family Outcome: Progressing   Problem: Self-Care: Goal: Ability to participate in self-care as condition permits will improve Outcome: Progressing Goal: Verbalization of feelings and concerns over difficulty with self-care will improve Outcome: Progressing Goal: Ability to communicate needs accurately will improve Outcome: Progressing   Problem: Nutrition: Goal: Risk of aspiration will decrease Outcome: Progressing Goal: Dietary intake will improve Outcome: Progressing   Problem: Education: Goal: Knowledge of General Education information will improve Description: Including pain rating scale, medication(s)/side effects and non-pharmacologic comfort measures Outcome: Progressing   Problem: Health Behavior/Discharge Planning: Goal: Ability to manage health-related needs will improve Outcome: Progressing   Problem: Clinical Measurements: Goal: Ability to maintain clinical measurements within normal limits will improve Outcome: Progressing Goal: Will remain free from infection Outcome: Progressing Goal: Diagnostic test results will improve Outcome: Progressing Goal: Respiratory  complications will improve Outcome: Progressing Goal: Cardiovascular complication will be avoided Outcome: Progressing   Problem: Activity: Goal: Risk for activity intolerance will decrease Outcome: Progressing   Problem: Nutrition: Goal: Adequate nutrition will be maintained Outcome: Progressing   Problem: Coping: Goal: Level of anxiety will decrease Outcome: Progressing   Problem: Elimination: Goal: Will not experience complications related to bowel motility Outcome: Progressing Goal: Will not experience complications related to urinary retention Outcome: Progressing   Problem: Pain Management: Goal: General experience of comfort will improve Outcome: Progressing   Problem: Safety: Goal: Ability to remain free from injury will improve Outcome: Progressing   Problem: Skin Integrity: Goal: Risk for impaired skin integrity will decrease Outcome: Progressing   Problem: Education: Goal: Knowledge of discharge needs will improve Outcome: Progressing   Problem: Clinical Measurements: Goal: Postoperative complications will be avoided or minimized Outcome: Progressing   Problem: Respiratory: Goal: Ability to achieve and maintain a regular respiratory rate will improve Outcome: Progressing   Problem: Skin Integrity: Goal: Demonstration of wound healing without infection will improve Outcome: Progressing

## 2023-04-14 NOTE — Progress Notes (Signed)
Physical Therapy Treatment Patient Details Name: Thomas [name] MRN: [id number] DOB: 04-20-1931 Today's Date: 04/14/2023   History of Present Illness Pt is a 89 y.o. male presenting to hospital 03/11/23 with generalized weakness, dizziness, and being off balance; came home from [facility] rehab 7 days prior after multifocal small vessel infarcts.  Imaging showing acute infarct of R anterior thalamus.  Pt admitted with acute CVA.  S/p 03/19/23 R CEA (PT/OT orders discontinued 03/19/23). Pt also noted with hematuria and acute urinary retention.  New PT/OT orders received 03/29/23.  PMH includes recent stroke 3 weeks ago, htn, HLD.    PT Comments  Increased independence and participation with mobility today. Patient reports feeling better and was feeding himself breakfast on arrival to room. Transfer training continued today. Min A +2 for standing from bed and Min A of one person for standing from steady. Sitting balance has also improved since session yesterday. Recommend to continue PT to maximize independence and decrease caregiver burden. Anticipate patient will need rehabilitation <3 hours/day after this hospital stay.    If plan is discharge home, recommend the following: Two people to help with walking and/or transfers;Two people to help with bathing/dressing/bathroom;Assistance with cooking/housework;Assistance with feeding;Direct supervision/assist for medications management;Assist for transportation;Supervision due to cognitive status;Help with stairs or ramp for entrance   Can travel by private vehicle     No  Equipment Recommendations   (to be determined at next level of care)    Recommendations for Other Services       Precautions / Restrictions Precautions Precautions: Fall Restrictions Weight Bearing Restrictions: No     Mobility  Bed Mobility Overal bed mobility: Needs Assistance Bed Mobility: Supine to Sit     Supine to sit: Max assist     General bed mobility comments:  assistance for trunk and BLE support. increased time and effort required. cues provided for technique    Transfers Overall transfer level: Needs assistance Equipment used:  (stedy) Transfers: Sit to/from Stand Sit to Stand: From elevated surface, +2 safety/equipment, Min assist           General transfer comment: Min A +2 for standing from bed. Min A for standing from steady x 2 bouts. cues for technique and positioning of LE to faciliate independence with standing    Ambulation/Gait               General Gait Details: not attempted at this time   Stairs             Wheelchair Mobility     Tilt Bed    Modified Rankin (Stroke Patients Only)       Balance Overall balance assessment: Needs assistance Sitting-balance support: Feet supported, No upper extremity supported Sitting balance-[name]: Poor Sitting balance - Comments: posterior lean initially with sitting with faciliation for anterior weight shifting. balance progressed from poor to fair Postural control: Posterior lean (no lateral lean is noted today)                                  Cognition Arousal: Alert Behavior During Therapy: WFL for tasks assessed/performed Overall Cognitive Status: History of cognitive impairments - at baseline                           Safety/Judgement: Decreased awareness of deficits   Problem Solving: Slow processing, Decreased initiation, Difficulty sequencing, Requires verbal cues,  Requires tactile cues General Comments: patient is able to follow single step commands with increased time and multi modal cues        Exercises      General Comments General comments (skin integrity, edema, etc.): patient is much more awake, alert, and interactive today than previous session. he was eating breakfast on arrival to room      Pertinent Vitals/Pain Pain Assessment Pain Assessment: No/denies pain    Home Living                           Prior Function            PT Goals (current goals can now be found in the care plan section) Acute Rehab PT Goals Patient Stated Goal: to improve strength PT Goal Formulation: With patient Time For Goal Achievement: 04/27/23 Potential to Achieve Goals: Fair Progress towards PT goals: Progressing toward goals    Frequency    Min 1X/week      PT Plan      Co-evaluation PT/OT/SLP Co-Evaluation/Treatment: Yes Reason for Co-Treatment: For patient/therapist safety;To address functional/ADL transfers PT goals addressed during session: Mobility/safety with mobility;Balance;Proper use of DME        AM-PAC PT "6 Clicks" Mobility   Outcome Measure  Help needed turning from your back to your side while in a flat bed without using bedrails?: A Lot Help needed moving from lying on your back to sitting on the side of a flat bed without using bedrails?: A Lot Help needed moving to and from a bed to a chair (including a wheelchair)?: A Lot Help needed standing up from a chair using your arms (e.g., wheelchair or bedside chair)?: Total Help needed to walk in hospital room?: Total Help needed climbing 3-5 steps with a railing? : Total 6 Click Score: 9    End of Session         PT Visit Diagnosis: Unsteadiness on feet (R26.81);Other abnormalities of gait and mobility (R26.89);Repeated falls (R29.6);Muscle weakness (generalized) (M62.81);Hemiplegia and hemiparesis     Time: 0950-1015 PT Time Calculation (min) (ACUTE ONLY): 25 min  Charges:    $Therapeutic Activity: 8-22 mins PT General Charges $$ ACUTE PT VISIT: 1 Visit                     Donna Bernard, PT, MPT    Ina Homes 04/14/2023, 10:28 AM

## 2023-04-15 DIAGNOSIS — I639 Cerebral infarction, unspecified: Secondary | ICD-10-CM | POA: Diagnosis not present

## 2023-04-15 LAB — CBC
HCT: 37.4 % — ABNORMAL LOW (ref 39.0–52.0)
Hemoglobin: 12.2 g/dL — ABNORMAL LOW (ref 13.0–17.0)
MCH: 30 pg (ref 26.0–34.0)
MCHC: 32.6 g/dL (ref 30.0–36.0)
MCV: 91.9 fL (ref 80.0–100.0)
Platelets: 325 10*3/uL (ref 150–400)
RBC: 4.07 MIL/uL — ABNORMAL LOW (ref 4.22–5.81)
RDW: 13.8 % (ref 11.5–15.5)
WBC: 9.7 10*3/uL (ref 4.0–10.5)
nRBC: 0 % (ref 0.0–0.2)

## 2023-04-15 LAB — BASIC METABOLIC PANEL
Anion gap: 11 (ref 5–15)
BUN: 30 mg/dL — ABNORMAL HIGH (ref 8–23)
CO2: 23 mmol/L (ref 22–32)
Calcium: 8.9 mg/dL (ref 8.9–10.3)
Chloride: 107 mmol/L (ref 98–111)
Creatinine, Ser: 1.28 mg/dL — ABNORMAL HIGH (ref 0.61–1.24)
GFR, Estimated: 53 mL/min — ABNORMAL LOW (ref >60)
Glucose, Bld: 153 mg/dL — ABNORMAL HIGH (ref 70–99)
Potassium: 3.5 mmol/L (ref 3.5–5.1)
Sodium: 141 mmol/L (ref 135–145)

## 2023-04-15 MED ORDER — ENOXAPARIN SODIUM 40 MG/0.4ML IJ SOSY
40.0000 mg | PREFILLED_SYRINGE | Freq: Every day | INTRAMUSCULAR | Status: DC
  Administered 2023-04-15: 40 mg via SUBCUTANEOUS
  Filled 2023-04-15: qty 0.4

## 2023-04-15 NOTE — Plan of Care (Signed)
  Problem: Education: Goal: Knowledge of disease or condition will improve Outcome: Progressing Goal: Knowledge of secondary prevention will improve (MUST DOCUMENT ALL) Outcome: Progressing Goal: Knowledge of patient specific risk factors will improve Loraine Leriche N/A or DELETE if not current risk factor) Outcome: Progressing   Problem: Ischemic Stroke/TIA Tissue Perfusion: Goal: Complications of ischemic stroke/TIA will be minimized Outcome: Progressing   Problem: Coping: Goal: Will verbalize positive feelings about self Outcome: Progressing Goal: Will identify appropriate support needs Outcome: Progressing   Problem: Health Behavior/Discharge Planning: Goal: Ability to manage health-related needs will improve Outcome: Progressing Goal: Goals will be collaboratively established with patient/family Outcome: Progressing   Problem: Self-Care: Goal: Ability to participate in self-care as condition permits will improve Outcome: Progressing Goal: Verbalization of feelings and concerns over difficulty with self-care will improve Outcome: Progressing Goal: Ability to communicate needs accurately will improve Outcome: Progressing   Problem: Nutrition: Goal: Risk of aspiration will decrease Outcome: Progressing Goal: Dietary intake will improve Outcome: Progressing   Problem: Education: Goal: Knowledge of General Education information will improve Description: Including pain rating scale, medication(s)/side effects and non-pharmacologic comfort measures Outcome: Progressing   Problem: Health Behavior/Discharge Planning: Goal: Ability to manage health-related needs will improve Outcome: Progressing   Problem: Clinical Measurements: Goal: Ability to maintain clinical measurements within normal limits will improve Outcome: Progressing Goal: Will remain free from infection Outcome: Progressing Goal: Diagnostic test results will improve Outcome: Progressing Goal: Respiratory  complications will improve Outcome: Progressing Goal: Cardiovascular complication will be avoided Outcome: Progressing   Problem: Activity: Goal: Risk for activity intolerance will decrease Outcome: Progressing   Problem: Nutrition: Goal: Adequate nutrition will be maintained Outcome: Progressing   Problem: Coping: Goal: Level of anxiety will decrease Outcome: Progressing   Problem: Elimination: Goal: Will not experience complications related to bowel motility Outcome: Progressing Goal: Will not experience complications related to urinary retention Outcome: Progressing   Problem: Pain Management: Goal: General experience of comfort will improve Outcome: Progressing   Problem: Safety: Goal: Ability to remain free from injury will improve Outcome: Progressing   Problem: Skin Integrity: Goal: Risk for impaired skin integrity will decrease Outcome: Progressing   Problem: Education: Goal: Knowledge of discharge needs will improve Outcome: Progressing   Problem: Clinical Measurements: Goal: Postoperative complications will be avoided or minimized Outcome: Progressing   Problem: Respiratory: Goal: Ability to achieve and maintain a regular respiratory rate will improve Outcome: Progressing   Problem: Skin Integrity: Goal: Demonstration of wound healing without infection will improve Outcome: Progressing

## 2023-04-15 NOTE — TOC Progression Note (Signed)
Transition of Care [facility]) - Progression Note    Patient Details  Name: Thomas [name] MRN: [id number] Date of Birth: 1931/05/08  Transition of Care [facility] CM/SW Contact  Allena Katz, LCSW Phone Number: 04/15/2023, 3:20 PM  Clinical Narrative:   CSW spoke with tiffany at [facility] who states authorization is still pending.    Expected Discharge Plan: Skilled Nursing Facility Barriers to Discharge: Continued Medical Work up  Expected Discharge Plan and Services                                               Social Determinants of Health (SDOH) Interventions SDOH Screenings   Food Insecurity: No Food Insecurity (03/12/2023)  Housing: Low Risk  (03/12/2023)  Transportation Needs: No Transportation Needs (03/12/2023)  Utilities: Not At Risk (03/12/2023)  Financial Resource Strain: Low Risk  (10/12/2022)   Received from [facility]  Tobacco Use: Low Risk  (03/19/2023)  Health Literacy: High Risk (03/12/2023)   Received from [facility]    Readmission Risk Interventions     No data to display

## 2023-04-15 NOTE — Progress Notes (Signed)
[facility] Progress Note  Patient: Thomas [name]    VWU:[id number]  DOA: 03/11/2023     Date of Service: the patient was seen and examined on 04/15/2023  Chief Complaint  Patient presents with   Weakness   Brief hospital course: Thomas [name] is a 89 y.o. male with medical history significant of hypertension, hyperlipidemia, GERD, recent CVA 3 weeks prior to presentation here, was treated at [facility] and d/c to rehab 1 week prior to presentation here. Was doing well but 1 night before presentation to [facility] family states the patient did not want to eat dinner and started complaining of dizziness. The morning of presentation patient was having trouble with coordination unable to raise himself out of bed. Primary caregiver stated that the patient was discharged on dual antiplatelet therapy aspirin and Plavix and has been compliant with all medications.    Hospital course / significant events:  10/31: admitted to hospitalist service for new CVA  11/05: cardiology clearance for surgery  11/08: R Carotid Endarterectomy  11/11: urology consult for traumatic Foley catheter self-removal, re-placed catheter Intermittent vomiting/nausea w/ negative w/u labs and KUB Placement difficulty 12/01-12/02: more lethargic, (+)SIRS, code stroke but no concern for new acute CVA, workup revealed likely cholecystitis, started Zosyn, gen surg advises to continue abx and monitor, pt appears improved no plans for surgical intervention, would consider chole drain w/ IR if needed      Consultants:  Neurology Vascular Surgery Cardiology Urology  General Surgery    Procedures/Surgeries: 11/08: R Carotid Endarterectomy    Assessment and Plan:  # Sepsis d/t Cholecystitis - sepsis resolved Continue IV Zosyn No procedures planned at this time given clinical improvement but if deterioration would opt for cholecystostomy tube over surgery if possible given overall functional status and given DAPT  General surgery  was consulted, recommended conservative management, signed off.  # Acute CVA w/ Hx recent previous CVA on Statin + DAPT  Right carotid stenosis S/p right carotid endarterectomy on 11/8 cont ASA cont plavix (resumed on 11/22) cont Lipitor 40   # Hematuria 2/2 penile trauma w/ traumatic foley catheter self-removal Chronic Foley d/t BPH w/ urinary retenetion  per nursing, pt was confused post-op and pulled out his Foley.   urology consulted monitor for hematuria Foley removed 11/29 for voiding trial.  Per RN, pt was having difficult voiding 11/30, ICU charge reinserted coude Foley 11/30 cont Flomax (new) outpatient f/u with [facility]    # AKI - resolved Monitor BMP Po hydration encouraged Maintain Foley as above  12/5 Cr 1.28 elevated, could be dehydration.  RN was advised to encourage him for oral fluid intake.  # Essential hypertension BP has been low during hospitalization.  Adjust Rx as needed   Hyperlipidemia cont Lipitor   GERD cont PPI   Hospital delirium more confused past few days but better today w/ tx for cholecystitis  supportive care   Hypokalemia Replace as needed Monitor BMP   Constipation Early satiety likely d/t constipation Ambulate as able, po hydration encouraged Pt states not able to take powders very well Bowel regimen to include liquid colace, MiraLAX daily, Dulcolax 10 mg p.o. nightly, dulcolax suppository prn        Normal weight based on BMI:   Body mass index is 24.54 kg/m.  Interventions:  Diet: Dysphagia 3 diet DVT Prophylaxis: SCD   Advance goals of care discussion: DNR  Family Communication: family was not present at bedside, at the time of interview.  The pt  provided permission to discuss medical plan with the family. Opportunity was given to ask question and all questions were answered satisfactorily.   Disposition:  Pt is from Home, admitted with CVA, s/p CEA and developed acute cholecystitis, on IV antibiotics,  which precludes a safe discharge.  He is gradually getting better, may be stable to discharge in 1 to 2 days. Discharge to SNF, when bed will be available, most likely in 1 to 2 days. Placement difficulty - family initially unhapppy w/ facilities offered 11/13, 11/18 insurance auth was pending, somehow got cancelled, still pending as of 12/01. Now w/ cholecystitis but if remians stable next couple days may be appropriate for discharge   Subjective: No significant events overnight, patient was laying comfortably in the bed, denied any complaints.   Physical Exam: General: NAD, lying comfortably Appear in no distress, affect appropriate Eyes: PERRLA ENT: Oral Mucosa Clear, moist, s/p Rt CEA, incision healing well Neck: no JVD,  Cardiovascular: S1 and S2 Present, no Murmur,  Respiratory: good respiratory effort, Bilateral Air entry equal and Decreased, no Crackles, no wheezes Abdomen: Bowel Sound present, Soft and no tenderness,  Skin: no rashes Extremities: no Pedal edema, no calf tenderness Neurologic: Left-sided weakness power 3-4/5, CN grossly intact Gait not checked due to patient safety concerns  Vitals:   04/14/23 0809 04/14/23 1739 04/14/23 2048 04/15/23 0749  BP: 118/85 (!) 138/93 125/84 134/81  Pulse: 78 85 76 (!) 58  Resp: 17  18 16   Temp: 98.2 F (36.8 C) 97.7 F (36.5 C) 97.7 F (36.5 C) (!) 97.3 F (36.3 C)  TempSrc:    Oral  SpO2: 94% 95% 96% 96%  Weight:      Height:       No intake or output data in the 24 hours ending 04/15/23 1259 Filed Weights   04/11/23 2100  Weight: 73.2 kg    Data Reviewed: I have personally reviewed and interpreted daily labs, tele strips, imagings as discussed above. I reviewed all nursing notes, pharmacy notes, vitals, pertinent old records I have discussed plan of care as described above with RN and patient/family.  CBC: Recent Labs  Lab 04/11/23 1913 04/12/23 0638 04/13/23 0427 04/14/23 0438 04/15/23 1053  WBC 15.4*  12.2* 13.2* 11.9* 9.7  HGB 12.7* 11.7* 12.3* 11.1* 12.2*  HCT 37.7* 35.2* 37.2* 34.3* 37.4*  MCV 88.5 90.0 90.5 91.2 91.9  PLT 288 267 292 285 325   Basic Metabolic Panel: Recent Labs  Lab 04/10/23 0443 04/11/23 0615 04/11/23 1913 04/12/23 0638 04/13/23 0427 04/14/23 0438 04/15/23 1053  NA 137   < > 137 138 137 137 141  K 3.8   < > 3.6 3.7 3.4* 3.2* 3.5  CL 107   < > 105 106 105 104 107  CO2 19*   < > 23 22 24 24 23   GLUCOSE 177*   < > 125* 127* 125* 111* 153*  BUN 20   < > 17 17 14  24* 30*  CREATININE 1.08   < > 0.82 0.97 0.81 1.18 1.28*  CALCIUM 8.8*   < > 8.9 8.7* 8.9 8.4* 8.9  MG 2.2  --   --   --   --  2.4  --   PHOS 3.0  --   --   --   --  2.9  --    < > = values in this interval not displayed.    Studies: No results found.  Scheduled Meds:  aspirin  81 mg Oral Daily  atorvastatin  40 mg Oral Daily   bisacodyl  10 mg Oral QHS   Chlorhexidine Gluconate Cloth  6 each Topical Daily   clopidogrel  75 mg Oral Daily   docusate  100 mg Oral Daily   enoxaparin (LOVENOX) injection  40 mg Subcutaneous QHS   hydrocortisone cream   Topical BID   pantoprazole  40 mg Oral QHS   polyethylene glycol  17 g Oral Daily   sertraline  25 mg Oral Daily   sodium chloride flush  3 mL Intravenous Q12H   tamsulosin  0.4 mg Oral Daily   Continuous Infusions:  piperacillin-tazobactam (ZOSYN)  IV 3.375 g (04/15/23 0603)   PRN Meds: acetaminophen **OR** acetaminophen, alum & mag hydroxide-simeth, bisacodyl, guaiFENesin-dextromethorphan, hydrALAZINE, metoprolol tartrate, ondansetron, oxyCODONE, phenol  Time spent: 35 minutes  Author: Gillis Santa. MD [facility] 04/15/2023 12:59 PM  To reach On-call, see care teams to locate the attending and reach out to them via [email or web address]. If 7PM-7AM, please contact night-coverage If you still have difficulty reaching the attending provider, please page the [facility] (Director on Call) for [facility] on amion for assistance.

## 2023-04-15 NOTE — Progress Notes (Signed)
Mobility Specialist - Progress Note   04/15/23 1200  Mobility  Activity Stood at bedside;Transferred from bed to chair  Level of Assistance Minimal assist, patient does 75% or more  Assistive Device Stedy  Activity Response Tolerated well  Mobility visit 1 Mobility     Pt lying in bed upon arrival, utilizing RA. Pt pleasantly agreeable to activity; states he's weak and wants to get better. Completed bed mobility with maxA. L lateral lean in sitting requiring assist to maintain midline balance. STS from bed to stedy with minA +2. Pt left in chair with alarm set, needs in reach. RN notified.   Thomas [name] Mobility Specialist 04/15/23, 12:57 PM

## 2023-04-16 ENCOUNTER — Inpatient Hospital Stay: Payer: Medicare (Managed Care)

## 2023-04-16 DIAGNOSIS — I639 Cerebral infarction, unspecified: Secondary | ICD-10-CM | POA: Diagnosis not present

## 2023-04-16 LAB — BASIC METABOLIC PANEL
Anion gap: 10 (ref 5–15)
BUN: 24 mg/dL — ABNORMAL HIGH (ref 8–23)
CO2: 25 mmol/L (ref 22–32)
Calcium: 8.8 mg/dL — ABNORMAL LOW (ref 8.9–10.3)
Chloride: 107 mmol/L (ref 98–111)
Creatinine, Ser: 1.11 mg/dL (ref 0.61–1.24)
GFR, Estimated: >60 mL/min (ref >60)
Glucose, Bld: 97 mg/dL (ref 70–99)
Potassium: 3.3 mmol/L — ABNORMAL LOW (ref 3.5–5.1)
Sodium: 142 mmol/L (ref 135–145)

## 2023-04-16 LAB — CBC WITH DIFFERENTIAL/PLATELET
Abs Immature Granulocytes: 0.08 10*3/uL — ABNORMAL HIGH (ref 0.00–0.07)
Basophils Absolute: 0 10*3/uL (ref 0.0–0.1)
Basophils Relative: 0 %
Eosinophils Absolute: 0.1 10*3/uL (ref 0.0–0.5)
Eosinophils Relative: 0 %
HCT: 35.6 % — ABNORMAL LOW (ref 39.0–52.0)
Hemoglobin: 11.5 g/dL — ABNORMAL LOW (ref 13.0–17.0)
Immature Granulocytes: 1 %
Lymphocytes Relative: 7 %
Lymphs Abs: 0.8 10*3/uL (ref 0.7–4.0)
MCH: 29 pg (ref 26.0–34.0)
MCHC: 32.3 g/dL (ref 30.0–36.0)
MCV: 89.9 fL (ref 80.0–100.0)
Monocytes Absolute: 0.6 10*3/uL (ref 0.1–1.0)
Monocytes Relative: 5 %
Neutro Abs: 10 10*3/uL — ABNORMAL HIGH (ref 1.7–7.7)
Neutrophils Relative %: 87 %
Platelets: 325 10*3/uL (ref 150–400)
RBC: 3.96 MIL/uL — ABNORMAL LOW (ref 4.22–5.81)
RDW: 13.5 % (ref 11.5–15.5)
WBC: 11.5 10*3/uL — ABNORMAL HIGH (ref 4.0–10.5)
nRBC: 0 % (ref 0.0–0.2)

## 2023-04-16 MED ORDER — ENOXAPARIN SODIUM 40 MG/0.4ML IJ SOSY: 40.0000 mg | PREFILLED_SYRINGE | Freq: Every day | INTRAMUSCULAR | Status: DC

## 2023-04-16 MED ORDER — SMOG ENEMA
960.0000 mL | Freq: Once | RECTAL | Status: AC
  Administered 2023-04-16: 960 mL via RECTAL
  Filled 2023-04-16: qty 960

## 2023-04-16 MED ORDER — IOHEXOL 9 MG/ML PO SOLN: 500.0000 mL | Freq: Once | ORAL | Status: DC | PRN

## 2023-04-16 MED ORDER — POTASSIUM CHLORIDE CRYS ER 20 MEQ PO TBCR
40.0000 meq | EXTENDED_RELEASE_TABLET | Freq: Once | ORAL | Status: AC
  Administered 2023-04-16: 40 meq via ORAL
  Filled 2023-04-16: qty 2

## 2023-04-16 MED ORDER — ATORVASTATIN CALCIUM 40 MG PO TABS: 40.0000 mg | ORAL_TABLET | Freq: Every day | ORAL | Status: AC

## 2023-04-16 MED ORDER — PIPERACILLIN-TAZOBACTAM 3.375 G IVPB
3.3750 g | Freq: Three times a day (TID) | INTRAVENOUS | Status: DC
  Administered 2023-04-16 – 2023-04-19 (×8): 3.375 g via INTRAVENOUS
  Filled 2023-04-16 (×9): qty 50

## 2023-04-16 MED ORDER — AMOXICILLIN-POT CLAVULANATE 875-125 MG PO TABS: 1.0000 | ORAL_TABLET | Freq: Two times a day (BID) | ORAL | Status: DC

## 2023-04-16 MED ORDER — ASPIRIN 81 MG PO CHEW: 81.0000 mg | CHEWABLE_TABLET | Freq: Every day | ORAL | Status: AC

## 2023-04-16 MED ORDER — TAMSULOSIN HCL 0.4 MG PO CAPS: 0.4000 mg | ORAL_CAPSULE | Freq: Every day | ORAL | Status: DC

## 2023-04-16 NOTE — Progress Notes (Signed)
Pt vomiting yellow bile drainage. Pt NPO after midnight for IR percutaneous cholecystostomy tomorrow.

## 2023-04-16 NOTE — Progress Notes (Signed)
[facility] Progress Note  Patient: Thomas [name]    ZHY:[id number]  DOA: 03/11/2023     Date of Service: the patient was seen and examined on 04/16/2023  Chief Complaint  Patient presents with   Weakness   Brief hospital course: Thomas [name] is a 89 y.o. male with medical history significant of hypertension, hyperlipidemia, GERD, recent CVA 3 weeks prior to presentation here, was treated at [facility] and d/c to rehab 1 week prior to presentation here. Was doing well but 1 night before presentation to [facility] family states the patient did not want to eat dinner and started complaining of dizziness. The morning of presentation patient was having trouble with coordination unable to raise himself out of bed. Primary caregiver stated that the patient was discharged on dual antiplatelet therapy aspirin and Plavix and has been compliant with all medications.    Hospital course / significant events:  10/31: admitted to hospitalist service for new CVA  11/05: cardiology clearance for surgery  11/08: R Carotid Endarterectomy  11/11: urology consult for traumatic Foley catheter self-removal, re-placed catheter Intermittent vomiting/nausea w/ negative w/u labs and KUB Placement difficulty 12/01-12/02: more lethargic, (+)SIRS, code stroke but no concern for new acute CVA, workup revealed likely cholecystitis, started Zosyn, gen surg advises to continue abx and monitor, pt appears improved no plans for surgical intervention, would consider chole drain w/ IR if needed      Consultants:  Neurology Vascular Surgery Cardiology Urology  General Surgery    Procedures/Surgeries: 11/08: R Carotid Endarterectomy    Assessment and Plan:  # Sepsis d/t Cholecystitis - sepsis resolved Continue IV Zosyn No procedures planned at this time given clinical improvement but if deterioration would opt for cholecystostomy tube over surgery if possible given overall functional status and given DAPT  General surgery  was consulted, recommended conservative management, signed off. 12/6 c/o decreased appetite and abdominal discomfort. CT scan abdomen showed worsening of gallbladder wall thickening and cholecystitis Discussed with general surgery, recommended percutaneous biliary drain by IR, keep n.p.o. after midnight and IR will do percutaneous biliary drain tomorrow a.m. Continue IV Zosyn in the meantime, we will switch to oral antibiotics on discharge, for total 10-day course  # Acute CVA w/ Hx recent previous CVA on Statin + DAPT  Right carotid stenosis S/p right carotid endarterectomy on 11/8 cont ASA cont plavix (resumed on 11/22) cont Lipitor 40   # Hematuria 2/2 penile trauma w/ traumatic foley catheter self-removal Chronic Foley d/t BPH w/ urinary retenetion  per nursing, pt was confused post-op and pulled out his Foley.   urology consulted monitor for hematuria Foley removed 11/29 for voiding trial.  Per RN, pt was having difficult voiding 11/30, ICU charge reinserted coude Foley 11/30 cont Flomax (new) outpatient f/u with [facility]    # AKI - resolved Monitor BMP Po hydration encouraged Maintain Foley as above  12/6 Cr 1.11 elevated, could be dehydration.  RN was advised to encourage him for oral fluid intake.  # h/p Essential hypertension BP has been low during hospitalization.  Patient is not on any antihypertensive medication at home   Hyperlipidemia cont Lipitor   GERD cont PPI   Hospital delirium more confused past few days but better today w/ tx for cholecystitis  supportive care   Hypokalemia Replace as needed Monitor BMP   Constipation Continue MiraLAX, Dulcolax and senna as needed 12/6 Smog enema one-time dose given today   normal weight based on BMI:   Body mass index  is 24.54 kg/m.  Interventions:  Diet: Dysphagia 3 diet DVT Prophylaxis: Lovenox  Advance goals of care discussion: DNR  Family Communication: family was not present at bedside,  at the time of interview.  The pt provided permission to discuss medical plan with the family. Opportunity was given to ask question and all questions were answered satisfactorily.   Disposition:  Pt is from Home, admitted with CVA, s/p CEA and developed acute cholecystitis, on IV antibiotics, which precludes a safe discharge.  He is gradually getting better, may be stable to discharge in 1 to 2 days. Discharge to SNF, when bed will be available, most likely in 1 to 2 days. Placement difficulty - family initially unhapppy w/ facilities offered 11/13, 11/18 insurance auth was pending, somehow got cancelled, still pending as of 12/01. Now w/ cholecystitis, which is getting worse, patient needs percutaneous biliary drain which will be done by IR tomorrow a.m. on 12/7  Subjective: No significant events overnight, in the morning time patient was lying comfortably in the bed, denied any abdominal pain, no nausea vomiting. RN notified that patient is constipated, enema was given and he started moving bowels.  Patient had small amount of vomiting. Discussed with patient's son-in-law who was concerned about decreased appetite and patient was not eating well and complaining of abdominal discomfort So CT scan was done and above management plan discussed.   Physical Exam: General: NAD, lying comfortably Appear in no distress, affect appropriate Eyes: PERRLA ENT: Oral Mucosa Clear, moist, s/p Rt CEA, incision healing well Neck: no JVD,  Cardiovascular: S1 and S2 Present, no Murmur,  Respiratory: good respiratory effort, Bilateral Air entry equal and Decreased, no Crackles, no wheezes Abdomen: Bowel Sound present, Soft and mild RUQ tenderness,  Skin: no rashes Extremities: no Pedal edema, no calf tenderness Neurologic: Left-sided weakness power 3-4/5, CN grossly intact Gait not checked due to patient safety concerns  Vitals:   04/16/23 0425 04/16/23 0824 04/16/23 1500 04/16/23 1556  BP: 124/73 123/74  (!) 145/87 118/76  Pulse: 68 85 71 (!) 58  Resp: 18 17 18 18   Temp: 98 F (36.7 C) 99.1 F (37.3 C) 98 F (36.7 C) 98 F (36.7 C)  TempSrc:    Oral  SpO2: 97% 94% 98% 95%  Weight:      Height:        Intake/Output Summary (Last 24 hours) at 04/16/2023 1617 Last data filed at 04/15/2023 2157 Gross per 24 hour  Intake --  Output 250 ml  Net -250 ml   Filed Weights   04/11/23 2100  Weight: 73.2 kg    Data Reviewed: I have personally reviewed and interpreted daily labs, tele strips, imagings as discussed above. I reviewed all nursing notes, pharmacy notes, vitals, pertinent old records I have discussed plan of care as described above with RN and patient/family.  CBC: Recent Labs  Lab 04/12/23 0638 04/13/23 0427 04/14/23 0438 04/15/23 1053 04/16/23 0450  WBC 12.2* 13.2* 11.9* 9.7 11.5*  NEUTROABS  --   --   --   --  10.0*  HGB 11.7* 12.3* 11.1* 12.2* 11.5*  HCT 35.2* 37.2* 34.3* 37.4* 35.6*  MCV 90.0 90.5 91.2 91.9 89.9  PLT 267 292 285 325 325   Basic Metabolic Panel: Recent Labs  Lab 04/10/23 0443 04/11/23 0615 04/12/23 0638 04/13/23 0427 04/14/23 0438 04/15/23 1053 04/16/23 0450  NA 137   < > 138 137 137 141 142  K 3.8   < > 3.7 3.4* 3.2* 3.5 3.3*  CL 107   < > 106 105 104 107 107  CO2 19*   < > 22 24 24 23 25   GLUCOSE 177*   < > 127* 125* 111* 153* 97  BUN 20   < > 17 14 24* 30* 24*  CREATININE 1.08   < > 0.97 0.81 1.18 1.28* 1.11  CALCIUM 8.8*   < > 8.7* 8.9 8.4* 8.9 8.8*  MG 2.2  --   --   --  2.4  --   --   PHOS 3.0  --   --   --  2.9  --   --    < > = values in this interval not displayed.    Studies: CT ABDOMEN PELVIS WO CONTRAST  Result Date: 04/16/2023 CLINICAL DATA:  Abdominal pain.  Small bowel obstruction suspected. EXAM: CT ABDOMEN AND PELVIS WITHOUT CONTRAST TECHNIQUE: Multidetector CT imaging of the abdomen and pelvis was performed following the standard protocol without IV contrast. RADIATION DOSE REDUCTION: This exam was performed  according to the departmental dose-optimization program which includes automated exposure control, adjustment of the mA and/or kV according to patient size and/or use of iterative reconstruction technique. COMPARISON:  Abdominopelvic CT 04/11/2023 and 02/14/2023. FINDINGS: Lower chest: Stable mild chronic fibrotic changes at both lung bases. No superimposed airspace disease, pleural effusion or pneumothorax. The heart is mildly enlarged. There is aortic and coronary artery atherosclerosis with aortic valvular and mitral annular calcifications. There is a small to moderate hiatal hernia. Hepatobiliary: The liver appears unremarkable as imaged in the noncontrast state. Small gallstones again noted. There is increased gallbladder wall thickening and surrounding inflammation suspicious for cholecystitis. No evidence of biliary ductal dilatation. Pancreas: Mild atrophy. No ductal dilatation or surrounding inflammation. Spleen: Normal in size without focal abnormality. Adrenals/Urinary Tract: Both adrenal glands appear normal. Unchanged bilateral renal cysts, larger on the left, for which no specific follow-up imaging is recommended. There is no evidence of urinary tract calculus or hydronephrosis. Foley catheter remains in place without apparent bladder abnormality. Stomach/Bowel: No enteric contrast administered. As above, there is a stable small to moderate hiatal hernia. The stomach otherwise appears unremarkable for its degree of distention. No evidence of bowel distension, wall thickening or surrounding inflammation. Moderate stool throughout the colon. Nonvisualization of the appendix, as before. Vascular/Lymphatic: There are no enlarged abdominal or pelvic lymph nodes. Aortic and branch vessel atherosclerosis with stable 4.2 cm infrarenal abdominal aortic aneurysm. Reproductive: Mild enlargement of the prostate gland. Other: Stable small umbilical hernia containing only fat. No ascites or pneumoperitoneum.  Musculoskeletal: No acute or significant osseous findings. Multilevel spondylosis with chronic compression deformities. IMPRESSION: 1. Increased gallbladder wall thickening and surrounding inflammation suspicious for acute cholecystitis. Correlate clinically. 2. No evidence of bowel obstruction or other acute abdominopelvic findings. No other significant changes are seen. 3. Stable 4.2 cm infrarenal abdominal aortic aneurysm. Abdominal aortic aneurysm measuring 4.2 cm. Recommend CTA or MRA, as appropriate in this 89 year old, in 12 months and referral to vascular specialist. Reference: Journal of Vascular Surgery 67.1 (2018): 2-77. J Am Coll Radiol 2013;10:789-794. 4.  Aortic Atherosclerosis (ICD10-I70.0). Electronically Signed   By: Carey Bullocks M.D.   On: 04/16/2023 15:49    Scheduled Meds:  aspirin  81 mg Oral Daily   atorvastatin  40 mg Oral Daily   bisacodyl  10 mg Oral QHS   Chlorhexidine Gluconate Cloth  6 each Topical Daily   clopidogrel  75 mg Oral Daily   docusate  100 mg Oral Daily  enoxaparin (LOVENOX) injection  40 mg Subcutaneous QHS   hydrocortisone cream   Topical BID   pantoprazole  40 mg Oral QHS   polyethylene glycol  17 g Oral Daily   sertraline  25 mg Oral Daily   sodium chloride flush  3 mL Intravenous Q12H   tamsulosin  0.4 mg Oral Daily   Continuous Infusions:  piperacillin-tazobactam (ZOSYN)  IV 3.375 g (04/16/23 1448)   piperacillin-tazobactam (ZOSYN)  IV     PRN Meds: acetaminophen **OR** acetaminophen, alum & mag hydroxide-simeth, bisacodyl, guaiFENesin-dextromethorphan, hydrALAZINE, iohexol, metoprolol tartrate, ondansetron, oxyCODONE, phenol  Time spent: 55 minutes  Author: Gillis Santa. MD [facility] 04/16/2023 4:17 PM  To reach On-call, see care teams to locate the attending and reach out to them via [email or web address]. If 7PM-7AM, please contact night-coverage If you still have difficulty reaching the attending provider, please page the [facility]  (Director on Call) for [facility] on amion for assistance.

## 2023-04-16 NOTE — Progress Notes (Signed)
Enema administered as ordered, pt tolerated as well as he could as most of the solution came out during procedure. Small pieces of stool were coming out during slow administration and then pt has medium bowel movement. Pt also vomited after with small amount of yellow drainage.  Dr. Lucianne Muss informed.

## 2023-04-16 NOTE — Discharge Summary (Signed)
[facility] Discharge Summary   Patient: Thomas [name]:[id number]  PCP: Barbette Reichmann, MD  Date of admission: 03/11/2023   Date of discharge:  04/19/2023     Discharge Diagnoses:  Principal Problem:   Acute CVA (cerebrovascular accident) [facility]) Active Problems:   Generalized weakness   Acute cholecystitis   Admitted From: Home Disposition:  SNF   Recommendations for Outpatient Follow-up:  Follow-up with PCP, patient should be seen by an MD in 1 to 2 days, follow-up on constipation and oral intake. Follow-up with urology in 1 to 2 weeks Follow-up with vascular surgery in 1 to 2 weeks Follow-up with urology in 1 to 2 weeks Follow-up with general surgery for acute cholecystitis in 1 to 2 weeks and IR for Drain after 3-4 weeks Follow up LABS/TEST:  CBC and BMP after one week   Contact information for follow-up providers     Barbette Reichmann, MD. Schedule an appointment as soon as possible for a visit .   Specialty: Internal Medicine Contact information: [address] [place] [address] [phone]         Gilda Crease Latina Craver, MD Follow up in 1 month(s).   Specialties: Vascular Surgery, Cardiology, Radiology, Vascular Surgery Why: Carotid Ultrasounds Contact information: [address] [place] [address] [phone]              Contact information for after-discharge care     Destination     [facility] Preferred SNF .   Service: Skilled Nursing Contact information: [address] [place] [address] [phone]                    Diet recommendation: Dysphagia type 3 Thin Liquid  Activity: The patient is advised to gradually reintroduce usual activities, as tolerated  Discharge Condition: stable  Code Status: DNR -Limited  History of present illness: As per the H and P dictated on  admission Hospital Course:  Thomas [name] is a 89 y.o. male with medical history significant of hypertension, hyperlipidemia, GERD, recent CVA 3 weeks prior to presentation here, was treated at [facility] and d/c to rehab 1 week prior to presentation here. Was doing well but 1 night before presentation to [facility] family states the patient did not want to eat dinner and started complaining of dizziness. The morning of presentation patient was having trouble with coordination unable to raise himself out of bed. Primary caregiver stated that the patient was discharged on dual antiplatelet therapy aspirin and Plavix and has been compliant with all medications.    Hospital course / significant events:  10/31: admitted to hospitalist service for new CVA  11/05: cardiology clearance for surgery  11/08: R Carotid Endarterectomy  11/11: urology consult for traumatic Foley catheter self-removal, re-placed catheter Intermittent vomiting/nausea w/ negative w/u labs and KUB Placement difficulty 12/01-12/02: more lethargic, (+)SIRS, code stroke but no concern for new acute CVA, workup revealed likely cholecystitis, started Zosyn, gen surg advises to continue abx and monitor, pt appears improved no plans for surgical intervention, would consider chole drain w/ IR if needed 12/7 s/p IR Perc cholecystostomy tube insertion   Assessment and Plan:  # Sepsis d/t Cholecystitis - sepsis resolved S/p IV Zosyn, switched to Augmentin twice daily for 5 more days to complete 10-day course. No procedures planned at this time given clinical improvement but if deterioration would opt for cholecystostomy tube over surgery  if possible given overall functional status and given DAPT  General surgery was consulted, recommended conservative management, signed off.  Follow-up with general surgery as an outpatient if recurrent problem 12/6 c/o decreased appetite and abdominal discomfort. CT scan abdomen showed worsening of gallbladder wall  thickening and cholecystitis. Discussed with general surgery, recommended percutaneous biliary drain by IR, keep n.p.o. after midnight and IR will do percutaneous biliary drain on 12/7. Continued IV Zosyn.  12/7 Placement of a 3F percutaneous cholecystostomy tube with evacuation of 60 mL purulent bile.  Samples sent for culture.  Transition to oral antibiotics Augmentin twice daily for 5 additional days.  Follow-up pending fluid culture sent by IR.  Follow with IR in 3 to 4 weeks for biliary drain management and follow with general surgery in 1 to 2 weeks  # Acute CVA w/ Hx recent previous CVA on Statin + DAPT  Right carotid stenosis: S/p right carotid endarterectomy on 11/8 cont ASA and cont plavix (resumed on 11/22), cont Lipitor 40  # Hematuria 2/2 penile trauma w/ traumatic foley catheter self-removal Chronic Foley d/t BPH w/ urinary retenetion  per nursing, pt was confused post-op and pulled out his Foley.   urology consulted, no hematuria at this time. Foley removed 11/29 for voiding trial.  Per RN, pt was having difficult voiding 11/30, ICU charge reinserted coude Foley 11/30. cont Flomax (new) outpatient f/u with [facility]  # AKI - resolved, continue Po hydration encouraged. Maintain Foley as above  12/8 Cr 1.54 slightly elevated, most likely due to dehydration.  IV fluid was given overnight, creatinine 1.22 today.  Continue oral hydration. Repeat BMP after 1 week # h/o Essential hypertension, but BP has been stable, patient is not on any medication at this time.  Continue to monitor. # Hyperlipidemia: cont Lipitor # GERD: cont PPI # Hospital delirium could be due to confusion and infection and acute cholecystitis.  Resolved, no active issues at this time. Continue supportive care # Hypokalemia, potassium repleted.  Repeat BMP after 1 week # Constipation and Early satiety likely d/t constipation.  Continue MiraLAX twice daily, Dulcolax nightly and Dulcolax suppository as needed.  S/p enema given twice, patient is moving bowels.  Body mass index is 24.54 kg/m.  Nutrition Interventions:   Patient was seen by physical therapy, who recommended Therapy, SNF placement, which was arranged. On the day of the discharge the patient's vitals were stable, and no other acute medical condition were reported by patient. the patient was felt safe to be discharge at [facility].  Consultants:   Neurology Vascular Surgery Cardiology Urology  General Surgery  IR Procedures:  11/08: R Carotid Endarterectomy  12/7 Placement of a 3F percutaneous cholecystostomy tube with evacuation of 60 mL purulent bile.  Samples sent for culture.   Discharge Exam: General: Appear in no distress, no Rash; Oral Mucosa Clear, moist. Cardiovascular: S1 and S2 Present, no Murmur, Respiratory: normal respiratory effort, Bilateral Air entry present and no Crackles, no wheezes Abdomen: Bowel Sound present, Soft and no tenderness, biliary drain intact. Extremities: no Pedal edema, no calf tenderness Neurology: alert and oriented to time, place, and person affect appropriate.  Filed Weights   04/11/23 2100  Weight: 73.2 kg   Vitals:   04/19/23 0359 04/19/23 0840  BP: 124/79 (!) 144/86  Pulse: (!) 53 (!) 54  Resp: 16 19  Temp: 97.6 F (36.4 C) 97.7 F (36.5 C)  SpO2: 98% 98%    DISCHARGE MEDICATION: Allergies as of 04/19/2023   No Known  Allergies      Medication List     STOP taking these medications    aspirin EC 81 MG tablet Replaced by: aspirin 81 MG chewable tablet   lovastatin 40 MG tablet Commonly known as: MEVACOR   pravastatin 40 MG tablet Commonly known as: PRAVACHOL   senna 8.6 MG Tabs tablet Commonly known as: SENOKOT       TAKE these medications    acetaminophen 325 MG tablet Commonly known as: TYLENOL Take 2 tablets (650 mg total) by mouth every 6 (six) hours as needed for fever, headache or moderate pain.   amoxicillin-clavulanate 875-125 MG  tablet Commonly known as: AUGMENTIN Take 1 tablet by mouth every 12 (twelve) hours for 5 days.   aspirin 81 MG chewable tablet Chew 1 tablet (81 mg total) by mouth daily. Replaces: aspirin EC 81 MG tablet   atorvastatin 40 MG tablet Commonly known as: LIPITOR Take 1 tablet (40 mg total) by mouth daily.   bisacodyl 5 MG EC tablet Commonly known as: DULCOLAX Take 2 tablets (10 mg total) by mouth at bedtime.   bisacodyl 10 MG suppository Commonly known as: DULCOLAX Place 1 suppository (10 mg total) rectally daily as needed for severe constipation.   clopidogrel 75 MG tablet Commonly known as: PLAVIX Take 1 tablet (75 mg total) by mouth daily. Total 90 days from Sep 16 to Dec 14th as per Neurologist   cyanocobalamin 1000 MCG tablet Commonly known as: VITAMIN B12 Take 1,000 mcg by mouth daily.   pantoprazole 40 MG tablet Commonly known as: PROTONIX Take 1 tablet (40 mg total) by mouth daily.   polyethylene glycol 17 g packet Commonly known as: MIRALAX / GLYCOLAX Take 17 g by mouth 2 (two) times daily. What changed: when to take this   polyvinyl alcohol 1.4 % ophthalmic solution Commonly known as: LIQUIFILM TEARS Place 2 drops into both eyes as needed for dry eyes.   sertraline 25 MG tablet Commonly known as: ZOLOFT Take 1 tablet by mouth daily.   tamsulosin 0.4 MG Caps capsule Commonly known as: FLOMAX Take 1 capsule (0.4 mg total) by mouth daily after supper.       No Known Allergies Discharge Instructions     Call MD for:   Complete by: As directed    Any weakness or numbness, new neurological changes   Call MD for:  difficulty breathing, headache or visual disturbances   Complete by: As directed    Call MD for:  extreme fatigue   Complete by: As directed    Call MD for:  persistant dizziness or light-headedness   Complete by: As directed    Call MD for:  persistant nausea and vomiting   Complete by: As directed    Call MD for:  severe uncontrolled pain    Complete by: As directed    Call MD for:  temperature >100.4   Complete by: As directed    Discharge instructions   Complete by: As directed    Follow-up with PCP, patient should be seen by an MD in 1 to 2 days, follow-up on constipation and oral intake. Follow-up with urology in 1 to 2 weeks Follow-up with vascular surgery in 1 to 2 weeks Follow-up with urology in 1 to 2 weeks Follow-up with general surgery for acute cholecystitis in 1 to 2 weeks and IR for Drain after 3-4 weeks   Increase activity slowly   Complete by: As directed    No wound care   Complete by:  As directed        The results of significant diagnostics from this hospitalization (including imaging, microbiology, ancillary and laboratory) are listed below for reference.    Significant Diagnostic Studies: IR Perc Cholecystostomy  Result Date: 04/17/2023 INDICATION: 89 year old male with acute calculus cholecystitis. He has failed conservative therapy and requires placement of a percutaneous cholecystostomy tube. He has had recent cerebrovascular accidents in his on antiplatelet therapy with both aspirin and Plavix. Therefore, he is at slightly increased risk for bleeding. We will pursue a trans peritoneal approach rather than transhepatic. EXAM: CHOLECYSTOSTOMY MEDICATIONS: In patient receiving intravenous Zosyn therapy. Last dose at 5:30 a.m. No additional prophylaxis given. ANESTHESIA/SEDATION: Moderate (conscious) sedation was employed during this procedure. A total of Versed 0.5 mg and Fentanyl 25 mcg was administered intravenously. Moderate Sedation Time: 11 minutes. The patient's level of consciousness and vital signs were monitored continuously by radiology nursing throughout the procedure under my direct supervision. FLUOROSCOPY TIME:  Radiation exposure index: 3 mGy reference air kerma COMPLICATIONS: None immediate. PROCEDURE: Informed written consent was obtained from the patient after a thorough discussion of the  procedural risks, benefits and alternatives. All questions were addressed. Maximal Sterile Barrier Technique was utilized including caps, mask, sterile gowns, sterile gloves, sterile drape, hand hygiene and skin antiseptic. A timeout was performed prior to the initiation of the procedure. The right upper quadrant was interrogated with ultrasound. The distended gallbladder was successfully identified. A suitable skin entry site was selected and marked. Local anesthesia was attained by infiltration with 1% lidocaine. A small dermatotomy was made. Under real-time ultrasound guidance, a 21 gauge Accustick needle was advanced through the fundus of the gallbladder along a trans peritoneal approach. A 0.018 wire was then coiled in the gallbladder lumen. The needle was removed. The transitional dilator was advanced over the wire into the gallbladder fundus. A gentle injection of contrast material confirms opacification of the gallbladder lumen. The cystic duct is occluded. A 0.035 wire was coiled in the gallbladder. The transitional dilator was removed. The percutaneous tract was dilated to 12 [place]. A 12 [place] skater drain was advanced over the wire and formed. Aspiration yields frankly purulent bile. Images were obtained and stored for the medical record to confirm catheter tube placement. The catheter was secured to the skin with 0 Prolene suture and connected to gravity bag drainage. IMPRESSION: Successful placement of transperitoneal percutaneous cholecystostomy tube for the indication of acute calculus cholecystitis. Electronically Signed   By: Malachy Moan M.D.   On: 04/17/2023 12:39   CT ABDOMEN PELVIS WO CONTRAST  Result Date: 04/16/2023 CLINICAL DATA:  Abdominal pain.  Small bowel obstruction suspected. EXAM: CT ABDOMEN AND PELVIS WITHOUT CONTRAST TECHNIQUE: Multidetector CT imaging of the abdomen and pelvis was performed following the standard protocol without IV contrast. RADIATION DOSE REDUCTION:  This exam was performed according to the departmental dose-optimization program which includes automated exposure control, adjustment of the mA and/or kV according to patient size and/or use of iterative reconstruction technique. COMPARISON:  Abdominopelvic CT 04/11/2023 and 02/14/2023. FINDINGS: Lower chest: Stable mild chronic fibrotic changes at both lung bases. No superimposed airspace disease, pleural effusion or pneumothorax. The heart is mildly enlarged. There is aortic and coronary artery atherosclerosis with aortic valvular and mitral annular calcifications. There is a small to moderate hiatal hernia. Hepatobiliary: The liver appears unremarkable as imaged in the noncontrast state. Small gallstones again noted. There is increased gallbladder wall thickening and surrounding inflammation suspicious for cholecystitis. No evidence of biliary ductal dilatation.  Pancreas: Mild atrophy. No ductal dilatation or surrounding inflammation. Spleen: Normal in size without focal abnormality. Adrenals/Urinary Tract: Both adrenal glands appear normal. Unchanged bilateral renal cysts, larger on the left, for which no specific follow-up imaging is recommended. There is no evidence of urinary tract calculus or hydronephrosis. Foley catheter remains in place without apparent bladder abnormality. Stomach/Bowel: No enteric contrast administered. As above, there is a stable small to moderate hiatal hernia. The stomach otherwise appears unremarkable for its degree of distention. No evidence of bowel distension, wall thickening or surrounding inflammation. Moderate stool throughout the colon. Nonvisualization of the appendix, as before. Vascular/Lymphatic: There are no enlarged abdominal or pelvic lymph nodes. Aortic and branch vessel atherosclerosis with stable 4.2 cm infrarenal abdominal aortic aneurysm. Reproductive: Mild enlargement of the prostate gland. Other: Stable small umbilical hernia containing only fat. No ascites or  pneumoperitoneum. Musculoskeletal: No acute or significant osseous findings. Multilevel spondylosis with chronic compression deformities. IMPRESSION: 1. Increased gallbladder wall thickening and surrounding inflammation suspicious for acute cholecystitis. Correlate clinically. 2. No evidence of bowel obstruction or other acute abdominopelvic findings. No other significant changes are seen. 3. Stable 4.2 cm infrarenal abdominal aortic aneurysm. Abdominal aortic aneurysm measuring 4.2 cm. Recommend CTA or MRA, as appropriate in this 89 year old, in 12 months and referral to vascular specialist. Reference: Journal of Vascular Surgery 67.1 (2018): 2-77. J Am Coll Radiol 2013;10:789-794. 4.  Aortic Atherosclerosis (ICD10-I70.0). Electronically Signed   By: Carey Bullocks M.D.   On: 04/16/2023 15:49   CT ABDOMEN PELVIS WO CONTRAST  Result Date: 04/11/2023 CLINICAL DATA:  Bowel obstruction suspected. Nausea and vomiting. Constipation. EXAM: CT ABDOMEN AND PELVIS WITHOUT CONTRAST TECHNIQUE: Multidetector CT imaging of the abdomen and pelvis was performed following the standard protocol without IV contrast. RADIATION DOSE REDUCTION: This exam was performed according to the departmental dose-optimization program which includes automated exposure control, adjustment of the mA and/or kV according to patient size and/or use of iterative reconstruction technique. COMPARISON:  Abdominal radiographs 04/10/2023.  CT 02/14/2023 FINDINGS: Lower chest: Slight fibrosis in the lung bases. Cardiac enlargement. Moderate-sized esophageal hiatal hernia. Hepatobiliary: Cholelithiasis with gas-filled stones in the gallbladder. Mild gallbladder wall thickening and stranding may indicate acute cholecystitis in the appropriate clinical setting. No focal liver lesions. Pancreas: Unremarkable. No pancreatic ductal dilatation or surrounding inflammatory changes. Spleen: Normal in size without focal abnormality. Adrenals/Urinary Tract: No  adrenal gland nodules. Bilateral renal cysts. Largest on the left is 7.4 cm in diameter. No change since prior study. No imaging follow-up is indicated. No hydronephrosis or hydroureter. Bladder is decompressed with a Foley catheter in place. Stomach/Bowel: Stomach, small bowel, and colon are not abnormally distended. Stool fills the colon. No wall thickening or inflammatory changes are appreciated. Appendix is not identified. Vascular/Lymphatic: Calcification of the aorta. Infrarenal abdominal aortic aneurysm measuring 4.2 cm AP dimension. No change since prior study. No significant lymphadenopathy. Reproductive: Prostate is unremarkable. Other: No abdominal wall hernia or abnormality. No abdominopelvic ascites. Musculoskeletal: Degenerative changes in the spine. No acute bony abnormalities. IMPRESSION: 1. Cholelithiasis with gallbladder wall thickening and mild stranding. Changes may represent acute cholecystitis in the appropriate clinical setting. 2. Abdominal aortic aneurysm measuring 4.2 cm. Recommend CTA or MRA, as appropriate, in 12 months and referral to vascular specialist. Reference: Journal of Vascular Surgery 67.1 (2018): 2-77. J Am Coll Radiol 2013;10:789-794. 3. Aortic atherosclerosis. 4. Slight fibrosis in the lung bases. Moderate esophageal hiatal hernia. Electronically Signed   By: Burman Nieves M.D.   On: 04/11/2023 19:50  CT HEAD CODE STROKE WO CONTRAST`  Result Date: 04/11/2023 CLINICAL DATA:  Code stroke. Provided history: Mental status change, unknown cause. EXAM: CT HEAD WITHOUT CONTRAST TECHNIQUE: Contiguous axial images were obtained from the base of the skull through the vertex without intravenous contrast. RADIATION DOSE REDUCTION: This exam was performed according to the departmental dose-optimization program which includes automated exposure control, adjustment of the mA and/or kV according to patient size and/or use of iterative reconstruction technique. COMPARISON:  Head CT  03/11/2023.  Brain MRI 03/11/2023. FINDINGS: Brain: Generalized cerebral atrophy. Prominence of the ventricles and sulci appears commensurate Advanced patchy and confluent hypoattenuation within the cerebral white matter, nonspecific but compatible chronic small vessel ischemic disease. 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 There is no acute intracranial hemorrhage. No demarcated cortical infarct. No extra-axial fluid collection. No evidence of an intracranial mass. No midline shift. Vascular: No hyperdense vessel.  Atherosclerotic calcifications. Skull: No calvarial fracture or aggressive osseous lesion. Sinuses/Orbits: No mass or acute finding within the imaged orbits. Mild bilateral ethmoid sinusitis. ASPECTS [facility] Stroke Program Early CT Score) - Ganglionic level infarction (caudate, lentiform nuclei, internal capsule, insula, M1-M3 cortex): 7 - Supraganglionic infarction (M4-M6 cortex): 3 Total score (0-10 with 10 being normal): 10 (when discounting chronic infarcts). No evidence of an acute intracranial abnormality. These results were communicated to Dr. Otelia Limes at 6:28 pmon 12/1/2024by text page via the [facility] messaging system. IMPRESSION: 1. No evidence of an acute intracranial abnormality. 2. Parenchymal atrophy with advanced chronic small vessel ischemic disease and multiple chronic infarcts as described. 3. Mild bilateral ethmoid sinusitis. Electronically Signed   By: Jackey Loge D.O.   On: 04/11/2023 18:29   DG Abd 1 View  Result Date: 04/10/2023 CLINICAL DATA:  89 year old male with history of vomiting. EXAM: ABDOMEN - 1 VIEW COMPARISON:  Abdominal radiograph 03/21/2023. FINDINGS: The bowel gas pattern is normal. No radio-opaque calculi or other significant radiographic abnormality are seen. IMPRESSION: Negative. Electronically Signed   By: Trudie Reed M.D.   On: 04/10/2023 06:24   DG Abd 1 View  Result Date: 03/21/2023 CLINICAL DATA:   Vomiting and weakness for 2 days. EXAM: ABDOMEN - 1 VIEW COMPARISON:  CT abdomen pelvis dated 02/14/2023. FINDINGS: There is a moderate amount of stool in the rectum. No dilated loops of bowel. Air-fluid levels and free intraperitoneal air can not be excluded on the supine exam. No radiopaque calculi in the expected location of the urinary tract. Degenerative changes are seen in the spine. IMPRESSION: Moderate amount of stool in the rectum. No dilated loops of bowel. Electronically Signed   By: Romona Curls M.D.   On: 03/21/2023 17:36    Microbiology: Recent Results (from the past 240 hour(s))  Aerobic/Anaerobic Culture w Gram Stain (surgical/deep wound)     Status: None (Preliminary result)   Collection Time: 04/17/23  9:52 AM   Specimen: BILE; Abscess  Result Value Ref Range Status   Specimen Description   Final    BILE Performed at [facility], [address] [place], [place] [address]    Special Requests   Final    NONE Performed at [facility], [address] [place], [place] [address]    Gram Stain   Final    MODERATE WBC PRESENT, PREDOMINANTLY PMN RARE GRAM POSITIVE RODS    Culture   Final    FEW GRAM NEGATIVE RODS CULTURE REINCUBATED FOR BETTER GROWTH IDENTIFICATION AND SUSCEPTIBILITIES TO FOLLOW Performed at [facility], [address], [place], [place] [address]    Report Status PENDING  Incomplete     Labs: CBC: Recent Labs  Lab 04/15/23 1053 04/16/23 0450 04/17/23 0347 04/18/23 0325 04/19/23 0314  WBC 9.7 11.5* 14.4* 9.7 7.3  NEUTROABS  --  10.0* 12.3* 7.8* 5.1  HGB 12.2* 11.5* 11.9* 11.2* 10.3*  HCT 37.4* 35.6* 36.8* 36.0* 32.0*  MCV 91.9 89.9 90.2 93.8 91.7  PLT 325 325 336 341 324   Basic Metabolic Panel: Recent Labs  Lab 04/14/23 0438 04/15/23 1053 04/16/23 0450 04/17/23 0347 04/18/23 0325 04/19/23 0314  NA 137 141 142 143 144 145  K 3.2* 3.5 3.3* 3.9 3.6 3.3*  CL 104 107 107 108 110 113*  CO2 24 23 25 25 24 26    GLUCOSE 111* 153* 97 119* 118* 94  BUN 24* 30* 24* 25* 38* 32*  CREATININE 1.18 1.28* 1.11 1.20 1.54* 1.22  CALCIUM 8.4* 8.9 8.8* 8.8* 8.7* 8.3*  MG 2.4  --   --  2.5*  --   --   PHOS 2.9  --   --  3.5  --   --    Liver Function Tests: No results for input(s): "AST", "ALT", "ALKPHOS", "BILITOT", "PROT", "ALBUMIN" in the last 168 hours.  No results for input(s): "LIPASE", "AMYLASE" in the last 168 hours. No results for input(s): "AMMONIA" in the last 168 hours. Cardiac Enzymes: No results for input(s): "CKTOTAL", "CKMB", "CKMBINDEX", "TROPONINI" in the last 168 hours. BNP (last 3 results) Recent Labs    01/25/23 1357  BNP 117.9*   CBG: No results for input(s): "GLUCAP" in the last 168 hours.   Time spent: 35 minutes  Signed:  Gillis Santa  [facility] 04/19/2023 12:06 PM

## 2023-04-16 NOTE — Plan of Care (Addendum)
Pt alert and oriented x 3 very forgetful. Pt has left sided weakness from CVA and S/P carotid Endarterectomy to right side with healing incision noted. Pt had enema today and has been having bowel movements. Pt has CT of ABD- inflammation of gallbladder, NPO after midnight for IR Percutaneous cholecystostomy.   Problem: Education: Goal: Knowledge of disease or condition will improve Outcome: Not Progressing Goal: Knowledge of secondary prevention will improve (MUST DOCUMENT ALL) Outcome: Not Progressing Goal: Knowledge of patient specific risk factors will improve Loraine Leriche N/A or DELETE if not current risk factor) Outcome: Not Progressing   Problem: Ischemic Stroke/TIA Tissue Perfusion: Goal: Complications of ischemic stroke/TIA will be minimized Outcome: Not Progressing   Problem: Coping: Goal: Will verbalize positive feelings about self Outcome: Not Progressing Goal: Will identify appropriate support needs Outcome: Not Progressing   Problem: Health Behavior/Discharge Planning: Goal: Ability to manage health-related needs will improve Outcome: Not Progressing Goal: Goals will be collaboratively established with patient/family Outcome: Not Progressing   Problem: Self-Care: Goal: Ability to participate in self-care as condition permits will improve Outcome: Not Progressing Goal: Verbalization of feelings and concerns over difficulty with self-care will improve Outcome: Not Progressing Goal: Ability to communicate needs accurately will improve Outcome: Not Progressing   Problem: Nutrition: Goal: Risk of aspiration will decrease Outcome: Not Progressing Goal: Dietary intake will improve Outcome: Not Progressing   Problem: Education: Goal: Knowledge of General Education information will improve Description: Including pain rating scale, medication(s)/side effects and non-pharmacologic comfort measures Outcome: Not Progressing   Problem: Health Behavior/Discharge Planning: Goal:  Ability to manage health-related needs will improve Outcome: Not Progressing   Problem: Clinical Measurements: Goal: Ability to maintain clinical measurements within normal limits will improve Outcome: Not Progressing Goal: Will remain free from infection Outcome: Not Progressing Goal: Diagnostic test results will improve Outcome: Not Progressing Goal: Respiratory complications will improve Outcome: Not Progressing Goal: Cardiovascular complication will be avoided Outcome: Not Progressing   Problem: Activity: Goal: Risk for activity intolerance will decrease Outcome: Not Progressing   Problem: Nutrition: Goal: Adequate nutrition will be maintained Outcome: Not Progressing   Problem: Coping: Goal: Level of anxiety will decrease Outcome: Not Progressing   Problem: Elimination: Goal: Will not experience complications related to bowel motility Outcome: Not Progressing Goal: Will not experience complications related to urinary retention Outcome: Not Progressing   Problem: Pain Management: Goal: General experience of comfort will improve Outcome: Not Progressing   Problem: Safety: Goal: Ability to remain free from injury will improve Outcome: Not Progressing   Problem: Skin Integrity: Goal: Risk for impaired skin integrity will decrease Outcome: Not Progressing   Problem: Education: Goal: Knowledge of discharge needs will improve Outcome: Not Progressing   Problem: Clinical Measurements: Goal: Postoperative complications will be avoided or minimized Outcome: Not Progressing   Problem: Respiratory: Goal: Ability to achieve and maintain a regular respiratory rate will improve Outcome: Not Progressing   Problem: Skin Integrity: Goal: Demonstration of wound healing without infection will improve Outcome: Not Progressing

## 2023-04-16 NOTE — Progress Notes (Signed)
Occupational Therapy Treatment Patient Details Name: Thomas [name] MRN: [id number] DOB: 11/28/1930 Today's Date: 04/16/2023   History of present illness Pt is a 89 y.o. male presenting to hospital 03/11/23 with generalized weakness, dizziness, and being off balance; came home from [facility] rehab 7 days prior after multifocal small vessel infarcts.  Imaging showing acute infarct of R anterior thalamus.  Pt admitted with acute CVA.  S/p 03/19/23 R CEA (PT/OT orders discontinued 03/19/23). Pt also noted with hematuria and acute urinary retention.  New PT/OT orders received 03/29/23.  PMH includes recent stroke 3 weeks ago, htn, HLD.   OT comments  Pt received semi-reclined. Appearing alert; willing to work with OT on transfer to chair. T/f STEDY MOD A x2. See flowsheet below for further details of session. Left seated in chair, alarm on, nephew at bedside, LE elevated, with all needs in reach.  Patient will benefit from continued OT while in acute care.       If plan is discharge home, recommend the following:  Two people to help with walking and/or transfers;A lot of help with bathing/dressing/bathroom;Assistance with cooking/housework;Assist for transportation;Help with stairs or ramp for entrance;Supervision due to cognitive status;Direct supervision/assist for medications management;Direct supervision/assist for financial management   Equipment Recommendations  Other (comment) (defer)    Recommendations for Other Services      Precautions / Restrictions Precautions Precautions: Fall Restrictions Weight Bearing Restrictions: No       Mobility Bed Mobility Overal bed mobility: Needs Assistance Bed Mobility: Supine to Sit     Supine to sit: HOB elevated, Used rails, Max assist          Transfers Overall transfer level: Needs assistance Equipment used:  (STEDY) Transfers: Sit to/from Stand Sit to Stand: +2 physical assistance, Via lift equipment, Mod assist           General  transfer comment: Difficulty with standing fully upright for OT to place STEDY paddles under buttocks for transfer. L lean. Transfer via Lift Equipment: Stedy   Balance Overall balance assessment: Needs assistance Sitting-balance support: Feet supported, Bilateral upper extremity supported Sitting balance-[name]: Fair     Standing balance support: Bilateral upper extremity supported, Reliant on assistive device for balance Standing balance-[name]: Poor                             ADL either performed or assessed with clinical judgement   ADL Overall ADL's : Needs assistance/impaired Eating/Feeding: Set up;Bed level Eating/Feeding Details (indicate cue type and reason): Pt noted to be eating when OT arrived; semi-reclined in bed                                   General ADL Comments: Today's session focused on transfer to chair and bed mobility; ADLs not addressed; foley catheter in place during session.    Extremity/Trunk Assessment Upper Extremity Assessment Upper Extremity Assessment: Generalized weakness   Lower Extremity Assessment Lower Extremity Assessment: Generalized weakness        Vision       Perception     Praxis      Cognition Arousal: Alert Behavior During Therapy: WFL for tasks assessed/performed Overall Cognitive Status: History of cognitive impairments - at baseline  General Comments: Follows cues to the best of his physical ability; is pleasant and cooperative during session.        Exercises      Shoulder Instructions       General Comments On room air    Pertinent Vitals/ Pain       Pain Assessment Pain Assessment: No/denies pain  Home Living                                          Prior Functioning/Environment              Frequency  Min 1X/week        Progress Toward Goals  OT Goals(current goals can now be found in  the care plan section)  Progress towards OT goals: Progressing toward goals  Acute Rehab OT Goals Patient Stated Goal: None stated OT Goal Formulation: With patient Time For Goal Achievement: 04/26/23 Potential to Achieve Goals: Good ADL Goals Pt Will Perform Grooming: sitting;with supervision;with set-up Pt Will Perform Lower Body Dressing: with mod assist;sit to/from stand Pt Will Transfer to Toilet: with mod assist;bedside commode;ambulating Pt Will Perform Toileting - Clothing Manipulation and hygiene: with min assist;sit to/from stand Additional ADL Goal #1: Patient sitting on edge of bed participating in light ADLs and upper body activity maintaining balance for 10 minutes with contact-guard  Plan      Co-evaluation                 AM-PAC OT "6 Clicks" Daily Activity     Outcome Measure   Help from another person eating meals?: None Help from another person taking care of personal grooming?: A Little Help from another person toileting, which includes using toliet, bedpan, or urinal?: Total Help from another person bathing (including washing, rinsing, drying)?: A Lot Help from another person to put on and taking off regular upper body clothing?: A Little Help from another person to put on and taking off regular lower body clothing?: Total 6 Click Score: 14    End of Session Equipment Utilized During Treatment: Gait belt;Other (comment) (STEDY sit to stand lift)  OT Visit Diagnosis: Unsteadiness on feet (R26.81);Other abnormalities of gait and mobility (R26.89);Repeated falls (R29.6);Muscle weakness (generalized) (M62.81);History of falling (Z91.81)   Activity Tolerance Patient tolerated treatment well   Patient Left in chair;with call bell/phone within reach;with chair alarm set;with family/visitor present (nephew in room)   Nurse Communication Mobility status        Time: 4742-5956 OT Time Calculation (min): 20 min  Charges: OT General Charges $OT Visit: 1  Visit OT Treatments $Therapeutic Activity: 8-22 mins  Linward Foster, MS, OTR/L  Alvester Morin 04/16/2023, 12:23 PM

## 2023-04-16 NOTE — Plan of Care (Signed)
  Problem: Education: Goal: Knowledge of disease or condition will improve Outcome: Progressing Goal: Knowledge of secondary prevention will improve (MUST DOCUMENT ALL) Outcome: Progressing Goal: Knowledge of patient specific risk factors will improve Loraine Leriche N/A or DELETE if not current risk factor) Outcome: Progressing   Problem: Ischemic Stroke/TIA Tissue Perfusion: Goal: Complications of ischemic stroke/TIA will be minimized Outcome: Progressing   Problem: Coping: Goal: Will verbalize positive feelings about self Outcome: Progressing Goal: Will identify appropriate support needs Outcome: Progressing   Problem: Self-Care: Goal: Ability to participate in self-care as condition permits will improve Outcome: Progressing Goal: Verbalization of feelings and concerns over difficulty with self-care will improve Outcome: Progressing Goal: Ability to communicate needs accurately will improve Outcome: Progressing   Problem: Nutrition: Goal: Risk of aspiration will decrease Outcome: Progressing Goal: Dietary intake will improve Outcome: Progressing   Problem: Education: Goal: Knowledge of General Education information will improve Description: Including pain rating scale, medication(s)/side effects and non-pharmacologic comfort measures Outcome: Progressing   Problem: Health Behavior/Discharge Planning: Goal: Ability to manage health-related needs will improve Outcome: Progressing   Problem: Clinical Measurements: Goal: Ability to maintain clinical measurements within normal limits will improve Outcome: Progressing Goal: Will remain free from infection Outcome: Progressing Goal: Diagnostic test results will improve Outcome: Progressing Goal: Respiratory complications will improve Outcome: Progressing Goal: Cardiovascular complication will be avoided Outcome: Progressing   Problem: Activity: Goal: Risk for activity intolerance will decrease Outcome: Progressing    Problem: Nutrition: Goal: Adequate nutrition will be maintained Outcome: Progressing   Problem: Coping: Goal: Level of anxiety will decrease Outcome: Progressing   Problem: Safety: Goal: Ability to remain free from injury will improve Outcome: Progressing   Problem: Pain Management: Goal: General experience of comfort will improve Outcome: Progressing   Problem: Skin Integrity: Goal: Risk for impaired skin integrity will decrease Outcome: Progressing   Problem: Skin Integrity: Goal: Demonstration of wound healing without infection will improve Outcome: Progressing   Problem: Respiratory: Goal: Ability to achieve and maintain a regular respiratory rate will improve Outcome: Progressing   Problem: Clinical Measurements: Goal: Postoperative complications will be avoided or minimized Outcome: Progressing   Problem: Education: Goal: Knowledge of discharge needs will improve Outcome: Progressing

## 2023-04-16 NOTE — TOC Transition Note (Signed)
Transition of Care [facility]) - CM/SW Discharge Note   Patient Details  Name: Thomas [name] MRN: [id number] Date of Birth: 1930/07/08  [facility] CM/SW Contact:  Allena Katz, LCSW Phone Number: 04/16/2023, 10:27 AM   Clinical Narrative:   Pt has auth for [facility]. DC summary to be sent once in. RN given number for report. Son notified of discharge. Medical necessity printed and on unit.     Final next level of care: Skilled Nursing Facility Barriers to Discharge: Barriers Resolved   Patient Goals and CMS Choice      Discharge Placement                Patient chooses bed at: [facility]     Patient and family notified of of transfer: 04/16/23  Discharge Plan and Services Additional resources added to the After Visit Summary for                                       Social Determinants of Health (SDOH) Interventions SDOH Screenings   Food Insecurity: No Food Insecurity (03/12/2023)  Housing: Low Risk  (03/12/2023)  Transportation Needs: No Transportation Needs (03/12/2023)  Utilities: Not At Risk (03/12/2023)  Financial Resource Strain: Low Risk  (10/12/2022)   Received from [facility]  Tobacco Use: Low Risk  (03/19/2023)  Health Literacy: High Risk (03/12/2023)   Received from [facility]     Readmission Risk Interventions     No data to display

## 2023-04-16 NOTE — Progress Notes (Signed)
Report given to Water engineer at [employer]

## 2023-04-17 ENCOUNTER — Inpatient Hospital Stay: Payer: Medicare (Managed Care) | Admitting: Radiology

## 2023-04-17 DIAGNOSIS — K810 Acute cholecystitis: Secondary | ICD-10-CM | POA: Diagnosis not present

## 2023-04-17 DIAGNOSIS — I639 Cerebral infarction, unspecified: Secondary | ICD-10-CM | POA: Diagnosis not present

## 2023-04-17 HISTORY — PX: IR PERC CHOLECYSTOSTOMY: IMG2326

## 2023-04-17 LAB — CBC WITH DIFFERENTIAL/PLATELET
Abs Immature Granulocytes: 0.25 10*3/uL — ABNORMAL HIGH (ref 0.00–0.07)
Basophils Absolute: 0 10*3/uL (ref 0.0–0.1)
Basophils Relative: 0 %
Eosinophils Absolute: 0.1 10*3/uL (ref 0.0–0.5)
Eosinophils Relative: 0 %
HCT: 36.8 % — ABNORMAL LOW (ref 39.0–52.0)
Hemoglobin: 11.9 g/dL — ABNORMAL LOW (ref 13.0–17.0)
Immature Granulocytes: 2 %
Lymphocytes Relative: 7 %
Lymphs Abs: 1 10*3/uL (ref 0.7–4.0)
MCH: 29.2 pg (ref 26.0–34.0)
MCHC: 32.3 g/dL (ref 30.0–36.0)
MCV: 90.2 fL (ref 80.0–100.0)
Monocytes Absolute: 0.7 10*3/uL (ref 0.1–1.0)
Monocytes Relative: 5 %
Neutro Abs: 12.3 10*3/uL — ABNORMAL HIGH (ref 1.7–7.7)
Neutrophils Relative %: 86 %
Platelets: 336 10*3/uL (ref 150–400)
RBC: 4.08 MIL/uL — ABNORMAL LOW (ref 4.22–5.81)
RDW: 13.6 % (ref 11.5–15.5)
WBC: 14.4 10*3/uL — ABNORMAL HIGH (ref 4.0–10.5)
nRBC: 0 % (ref 0.0–0.2)

## 2023-04-17 LAB — BASIC METABOLIC PANEL
Anion gap: 10 (ref 5–15)
BUN: 25 mg/dL — ABNORMAL HIGH (ref 8–23)
CO2: 25 mmol/L (ref 22–32)
Calcium: 8.8 mg/dL — ABNORMAL LOW (ref 8.9–10.3)
Chloride: 108 mmol/L (ref 98–111)
Creatinine, Ser: 1.2 mg/dL (ref 0.61–1.24)
GFR, Estimated: 57 mL/min — ABNORMAL LOW (ref >60)
Glucose, Bld: 119 mg/dL — ABNORMAL HIGH (ref 70–99)
Potassium: 3.9 mmol/L (ref 3.5–5.1)
Sodium: 143 mmol/L (ref 135–145)

## 2023-04-17 LAB — PHOSPHORUS: Phosphorus: 3.5 mg/dL (ref 2.5–4.6)

## 2023-04-17 LAB — MAGNESIUM: Magnesium: 2.5 mg/dL — ABNORMAL HIGH (ref 1.7–2.4)

## 2023-04-17 MED ORDER — SODIUM CHLORIDE 0.9% FLUSH
5.0000 mL | Freq: Three times a day (TID) | INTRAVENOUS | Status: DC
  Administered 2023-04-17 – 2023-04-19 (×7): 5 mL

## 2023-04-17 MED ORDER — ENOXAPARIN SODIUM 40 MG/0.4ML IJ SOSY
40.0000 mg | PREFILLED_SYRINGE | INTRAMUSCULAR | Status: DC
  Administered 2023-04-18 – 2023-04-19 (×2): 40 mg via SUBCUTANEOUS
  Filled 2023-04-17 (×2): qty 0.4

## 2023-04-17 MED ORDER — FENTANYL CITRATE (PF) 100 MCG/2ML IJ SOLN
INTRAMUSCULAR | Status: AC | PRN
Start: 2023-04-17 — End: 2023-04-17
  Administered 2023-04-17: 25 ug via INTRAVENOUS

## 2023-04-17 MED ORDER — MIDAZOLAM HCL 2 MG/2ML IJ SOLN
INTRAMUSCULAR | Status: AC | PRN
  Administered 2023-04-17: .5 mg via INTRAVENOUS

## 2023-04-17 MED ORDER — FENTANYL CITRATE (PF) 100 MCG/2ML IJ SOLN
INTRAMUSCULAR | Status: AC
  Filled 2023-04-17: qty 2

## 2023-04-17 MED ORDER — LIDOCAINE HCL 1 % IJ SOLN
INTRAMUSCULAR | Status: AC
  Filled 2023-04-17: qty 20

## 2023-04-17 MED ORDER — LIDOCAINE HCL 1 % IJ SOLN
10.0000 mL | Freq: Once | INTRAMUSCULAR | Status: AC
  Administered 2023-04-17: 10 mL via INTRADERMAL
  Filled 2023-04-17: qty 10

## 2023-04-17 MED ORDER — IOHEXOL 300 MG/ML  SOLN
4.0000 mL | Freq: Once | INTRAMUSCULAR | Status: AC | PRN
  Administered 2023-04-17: 4 mL

## 2023-04-17 MED ORDER — MIDAZOLAM HCL 2 MG/2ML IJ SOLN
INTRAMUSCULAR | Status: AC
  Filled 2023-04-17: qty 2

## 2023-04-17 NOTE — Progress Notes (Signed)
PHARMACIST - PHYSICIAN COMMUNICATION  CONCERNING:    Pharmacy asked to review PTA and Inpatient med lists for interrupted anticoagulation / antiplatelet orders. May resume prophylaxis drug orders and therapeutic drug orders with prior protocols   IR procedure: cholecystostomy tube   Bleeding Risk: standard  RECOMMENDATION: Patient was prescribed enoxaprin 40mg  q24 hours for VTE prophylaxis.   Per CHG guidelines: LMWH may be resumed day + 1 next am   [facility] Weights   04/11/23 2100  Weight: 73.2 kg (161 lb 6 oz)    Body mass index is 24.54 kg/m.  Estimated Creatinine Clearance: 39.6 mL/min (by C-G formula based on SCr of 1.2 mg/dL).   DESCRIPTION: Pharmacy has adjusted enoxaparin dose per [facility] policy.  Patient is now receiving enoxaparin 40 mg every 24 hours with next dose 04/18/23 1000    Lowella Bandy, PharmD Clinical Pharmacist  04/17/2023 10:01 AM

## 2023-04-17 NOTE — Progress Notes (Signed)
[facility] SURGICAL PROGRESS NOTE (cpt [phone])  Hospital Day(s): 32.   Interval History: Patient seen and examined, no acute events or new complaints overnight. Patient resting in bed. He is much more alert this AM. He denied any abdominal pain, nausea, emesis. No fever. Leukocytosis fluctuating; he had his drain placed this morning, he continues on Zosyn.  Review of Systems:  Constitutional: denies fever, chills  HEENT: denies cough or congestion  Respiratory: denies any shortness of breath  Cardiovascular: denies chest pain or palpitations  Gastrointestinal: Reports some localized abdominal pain, denies N/V Genitourinary: denies burning with urination or urinary frequency Musculoskeletal: denies pain, decreased motor or sensation  Vital signs in last 24 hours: [min-max] current  Temp:  [97.4 F (36.3 C)-98 F (36.7 C)] 98 F (36.7 C) (12/07 0729) Pulse Rate:  [0-74] 67 (12/07 0949) Resp:  [11-27] 13 (12/07 0949) BP: (115-145)/(66-103) 117/71 (12/07 0949) SpO2:  [94 %-98 %] 95 % (12/07 0949)     Height: 5\' 8"  (172.7 cm) Weight: 73.2 kg (From 02/19/23)     Intake/Output last 2 shifts:  No intake/output data recorded.   Physical Exam:  Constitutional: alert, cooperative and no distress  HENT: normocephalic without obvious abnormality  Eyes: PERRL, EOM's grossly intact and symmetric  Respiratory: breathing non-labored at rest  Cardiovascular: regular rate and sinus rhythm  Gastrointestinal: soft, his abdomen is tender this morning, just medial to his drain, which is draining and opaque bile tinged brown fluid.  Non-distended, no rebound/guarding  Genitourinary; Foley in place    Labs:     Latest Ref Rng & Units 04/17/2023    3:47 AM 04/16/2023    4:50 AM 04/15/2023   10:53 AM  CBC  WBC 4.0 - 10.5 K/uL 14.4  11.5  9.7   Hemoglobin 13.0 - 17.0 g/dL 60.4  54.0  98.1   Hematocrit 39.0 - 52.0 % 36.8  35.6  37.4   Platelets 150 - 400 K/uL 336  325  325        Latest Ref Rng & Units 04/17/2023    3:47 AM 04/16/2023    4:50 AM 04/15/2023   10:53 AM  CMP  Glucose 70 - 99 mg/dL 191  97  478   BUN 8 - 23 mg/dL 25  24  30    Creatinine 0.61 - 1.24 mg/dL 2.95  6.21  3.08   Sodium 135 - 145 mmol/L 143  142  141   Potassium 3.5 - 5.1 mmol/L 3.9  3.3  3.5   Chloride 98 - 111 mmol/L 108  107  107   CO2 22 - 32 mmol/L 25  25  23    Calcium 8.9 - 10.3 mg/dL 8.8  8.8  8.9      Imaging studies: No new pertinent imaging studies   Assessment/Plan: (ICD-10's: K81.0) 89 y.o. male with clinically improving acute cholecystitis, complicated by recent history of numerous strokes on Plavix    - Patient status post percutaneous cholecystostomy this AM, no significant complaint of pain.   He is no a surgical candidate given his multiple recent CVAs and need for Plavix.   - Okay to continue DYS diet as tolerated - Continue IV Abx;  - Monitor abdominal examination - Monitor leukocytosis;   - Mobilize as tolerated   - Further management per primary service   - Discharge Planning; From a gallbladder perspective, Nothing further from our perspective at this time. Abx as above, transition to p.o. when able. We will remain available should  clinical condition change.   All of the above findings and recommendations were discussed with the patient, and the medical team, and all of patient's questions were answered to his expressed satisfaction.   Campbell Lerner, M.D., [facility]  04/17/2023 ; 1:26 PM

## 2023-04-17 NOTE — Progress Notes (Signed)
[facility] Progress Note  [name]: Thomas [name]    QIH:[id number]  DOA: 03/11/2023     Date of Service: Thomas [name] was seen and examined on 04/17/2023  Chief Complaint  [name] presents with   Weakness   Brief hospital course: Thomas [name] is a 89 y.o. male with medical history significant of hypertension, hyperlipidemia, GERD, recent CVA 3 weeks prior to presentation here, was treated at [facility] and d/c to rehab 1 week prior to presentation here. Was doing well but 1 night before presentation to [facility] family states Thomas [name] did not want to eat dinner and started complaining of dizziness. Thomas morning of presentation [name] was having trouble with coordination unable to raise himself out of bed. Primary caregiver stated that Thomas [name] was discharged on dual antiplatelet therapy aspirin and Plavix and has been compliant with all medications.    Hospital course / significant events:  10/31: admitted to hospitalist service for new CVA  11/05: cardiology clearance for surgery  11/08: R Carotid Endarterectomy  11/11: urology consult for traumatic Foley catheter self-removal, re-placed catheter Intermittent vomiting/nausea w/ negative w/u labs and KUB Placement difficulty 12/01-12/02: more lethargic, (+)SIRS, code stroke but no concern for new acute CVA, workup revealed likely cholecystitis, started Zosyn, gen surg advises to continue abx and monitor, pt appears improved no plans for surgical intervention, would consider chole drain w/ IR if needed      Consultants:  Neurology Vascular Surgery Cardiology Urology  General Surgery    Procedures/Surgeries: 11/08: R Carotid Endarterectomy    Assessment and Plan:  # Sepsis d/t Cholecystitis - sepsis resolved Continue IV Zosyn No procedures planned at this time given clinical improvement but if deterioration would opt for cholecystostomy tube over surgery if possible given overall functional status and given DAPT  General surgery  was consulted, recommended conservative management, signed off. 12/6 c/o decreased appetite and abdominal discomfort. CT scan abdomen showed worsening of gallbladder wall thickening and cholecystitis Discussed with general surgery, recommended percutaneous biliary drain by IR, keep n.p.o. after midnight and IR will do percutaneous biliary drain tomorrow a.m. Continue IV Zosyn in Thomas meantime, we will switch to oral antibiotics on discharge, for total 10-day course 12/7 Procedure: Placement of a 24F percutaneous cholecystostomy tube with evacuation of 60 mL purulent bile.  Samples sent for culture    # Acute CVA w/ Hx recent previous CVA on Statin + DAPT  Right carotid stenosis S/p right carotid endarterectomy on 11/8 cont ASA cont plavix (resumed on 11/22) cont Lipitor 40   # Hematuria 2/2 penile trauma w/ traumatic foley catheter self-removal Chronic Foley d/t BPH w/ urinary retenetion  per nursing, pt was confused post-op and pulled out his Foley.   urology consulted monitor for hematuria Foley removed 11/29 for voiding trial.  Per RN, pt was having difficult voiding 11/30, ICU charge reinserted coude Foley 11/30 cont Flomax (new) outpatient f/u with [facility]    # AKI - resolved Monitor BMP Po hydration encouraged Maintain Foley as above  12/6 Cr 1.11 elevated, could be dehydration.  RN was advised to encourage him for oral fluid intake.  # h/p Essential hypertension BP has been low during hospitalization.  [name] is not on any antihypertensive medication at home   Hyperlipidemia cont Lipitor   GERD cont PPI   Hospital delirium more confused past few days but better today w/ tx for cholecystitis  supportive care   Hypokalemia Replace as needed Monitor BMP   Constipation Continue MiraLAX, Dulcolax  and senna as needed 12/6 Smog enema one-time dose given today   normal weight based on BMI:   Body mass index is 24.54 kg/m.  Interventions:  Diet:  Dysphagia 3 diet DVT Prophylaxis: Lovenox  Advance goals of care discussion: DNR  Family Communication: family was not present at bedside, at Thomas time of interview.  Thomas pt provided permission to discuss medical plan with Thomas family. Opportunity was given to ask question and all questions were answered satisfactorily.   Disposition:  Pt is from Home, admitted with CVA, s/p CEA and developed acute cholecystitis, on IV antibiotics, which precludes a safe discharge.  He is gradually getting better, may be stable to discharge in 1 to 2 days. Discharge to SNF, when bed will be available, most likely in 1 to 2 days. Placement difficulty - family initially unhapppy w/ facilities offered 11/13, 11/18 insurance auth was pending, somehow got cancelled, still pending as of 12/01. Now w/ cholecystitis, which is getting worse, [name] needs percutaneous biliary drain which will be done by IR tomorrow a.m. on 12/7  Subjective: No significant events overnight, [name] was seen after percutaneous cholecystostomy tube insertion.  Tolerated procedure well.  Denies any pain.  [name] says that he is hungry and he can try some food.   Physical Exam: General: NAD, lying comfortably Appear in no distress, affect appropriate Eyes: PERRLA ENT: Oral Mucosa Clear, moist, s/p Rt CEA, incision healing well Neck: no JVD,  Cardiovascular: S1 and S2 Present, no Murmur,  Respiratory: good respiratory effort, Bilateral Air entry equal and Decreased, no Crackles, no wheezes Abdomen: Bowel Sound present, Soft and mild RUQ tenderness, PC cholecystostomy tube intact Skin: no rashes Extremities: no Pedal edema, no calf tenderness Neurologic: Left-sided weakness power 3-4/5, CN grossly intact Gait not checked due to [name] safety concerns  Vitals:   04/17/23 0943 04/17/23 0945 04/17/23 0948 04/17/23 0949  BP: 120/76 120/76 117/71 117/71  Pulse: 66 68 70 67  Resp: 11 12 19 13   Temp:      TempSrc:      SpO2: 95% 95%  95% 95%  Weight:      Height:       No intake or output data in Thomas 24 hours ending 04/17/23 1511  Filed Weights   04/11/23 2100  Weight: 73.2 kg    Data Reviewed: I have personally reviewed and interpreted daily labs, tele strips, imagings as discussed above. I reviewed all nursing notes, pharmacy notes, vitals, pertinent old records I have discussed plan of care as described above with RN and [name]/family.  CBC: Recent Labs  Lab 04/13/23 0427 04/14/23 0438 04/15/23 1053 04/16/23 0450 04/17/23 0347  WBC 13.2* 11.9* 9.7 11.5* 14.4*  NEUTROABS  --   --   --  10.0* 12.3*  HGB 12.3* 11.1* 12.2* 11.5* 11.9*  HCT 37.2* 34.3* 37.4* 35.6* 36.8*  MCV 90.5 91.2 91.9 89.9 90.2  PLT 292 285 325 325 336   Basic Metabolic Panel: Recent Labs  Lab 04/13/23 0427 04/14/23 0438 04/15/23 1053 04/16/23 0450 04/17/23 0347  NA 137 137 141 142 143  K 3.4* 3.2* 3.5 3.3* 3.9  CL 105 104 107 107 108  CO2 24 24 23 25 25   GLUCOSE 125* 111* 153* 97 119*  BUN 14 24* 30* 24* 25*  CREATININE 0.81 1.18 1.28* 1.11 1.20  CALCIUM 8.9 8.4* 8.9 8.8* 8.8*  MG  --  2.4  --   --  2.5*  PHOS  --  2.9  --   --  3.5    Studies: IR Perc Cholecystostomy  Result Date: 04/17/2023 INDICATION: 89 year old male with acute calculus cholecystitis. He has failed conservative therapy and requires placement of a percutaneous cholecystostomy tube. He has had recent cerebrovascular accidents in his on antiplatelet therapy with both aspirin and Plavix. Therefore, he is at slightly increased risk for bleeding. We will pursue a trans peritoneal approach rather than transhepatic. EXAM: CHOLECYSTOSTOMY MEDICATIONS: In [name] receiving intravenous Zosyn therapy. Last dose at 5:30 a.m. No additional prophylaxis given. ANESTHESIA/SEDATION: Moderate (conscious) sedation was employed during this procedure. A total of Versed 0.5 mg and Fentanyl 25 mcg was administered intravenously. Moderate Sedation Time: 11 minutes. Thomas  [name]'s level of consciousness and vital signs were monitored continuously by radiology nursing throughout Thomas procedure under my direct supervision. FLUOROSCOPY TIME:  Radiation exposure index: 3 mGy reference air kerma COMPLICATIONS: None immediate. PROCEDURE: Informed written consent was obtained from Thomas [name] after a thorough discussion of Thomas procedural risks, benefits and alternatives. All questions were addressed. Maximal Sterile Barrier Technique was utilized including caps, mask, sterile gowns, sterile gloves, sterile drape, hand hygiene and skin antiseptic. A timeout was performed prior to Thomas initiation of Thomas procedure. Thomas right upper quadrant was interrogated with ultrasound. Thomas distended gallbladder was successfully identified. A suitable skin entry site was selected and marked. Local anesthesia was attained by infiltration with 1% lidocaine. A small dermatotomy was made. Under real-time ultrasound guidance, a 21 gauge Accustick needle was advanced through Thomas fundus of Thomas gallbladder along a trans peritoneal approach. A 0.018 wire was then coiled in Thomas gallbladder lumen. Thomas needle was removed. Thomas transitional dilator was advanced over Thomas wire into Thomas gallbladder fundus. A gentle injection of contrast material confirms opacification of Thomas gallbladder lumen. Thomas cystic duct is occluded. A 0.035 wire was coiled in Thomas gallbladder. Thomas transitional dilator was removed. Thomas percutaneous tract was dilated to 12 [place]. A 12 [place] skater drain was advanced over Thomas wire and formed. Aspiration yields frankly purulent bile. Images were obtained and stored for Thomas medical record to confirm catheter tube placement. Thomas catheter was secured to Thomas skin with 0 Prolene suture and connected to gravity bag drainage. IMPRESSION: Successful placement of transperitoneal percutaneous cholecystostomy tube for Thomas indication of acute calculus cholecystitis. Electronically Signed   By: Malachy Moan M.D.    On: 04/17/2023 12:39    Scheduled Meds:  aspirin  81 mg Oral Daily   atorvastatin  40 mg Oral Daily   bisacodyl  10 mg Oral QHS   Chlorhexidine Gluconate Cloth  6 each Topical Daily   clopidogrel  75 mg Oral Daily   docusate  100 mg Oral Daily   [START ON 04/18/2023] enoxaparin (LOVENOX) injection  40 mg Subcutaneous Q24H   hydrocortisone cream   Topical BID   pantoprazole  40 mg Oral QHS   polyethylene glycol  17 g Oral Daily   sertraline  25 mg Oral Daily   sodium chloride flush  3 mL Intravenous Q12H   sodium chloride flush  5 mL Intracatheter Q8H   tamsulosin  0.4 mg Oral Daily   Continuous Infusions:  piperacillin-tazobactam (ZOSYN)  IV 3.375 g (04/17/23 0536)   PRN Meds: acetaminophen **OR** acetaminophen, alum & mag hydroxide-simeth, bisacodyl, guaiFENesin-dextromethorphan, hydrALAZINE, iohexol, metoprolol tartrate, ondansetron, oxyCODONE, phenol  Time spent: 40 minutes  Author: Gillis Santa. MD [facility] 04/17/2023 3:11 PM  To reach On-call, see care teams to locate Thomas attending and reach out to them via [email or web address]. If 7PM-7AM,  please contact night-coverage If you still have difficulty reaching Thomas attending provider, please page [facility] (Director on Call) for [facility] on amion for assistance.

## 2023-04-17 NOTE — Consult Note (Signed)
Chief Complaint: Patient was seen in consultation today for  Chief Complaint  Patient presents with   Weakness   at the request of Laqueta Due, PA  Referring Physician(s): Laqueta Due, PA with [facility]   Patient Status: [facility] - In-pt  History of Present Illness: Thomas [name] is a 89 y.o. male admitted with acute calculous cholecystitis and multiple CVAs despite dual antiplatelet therapy.  Initially he was managed conservatively with IV abx and hydration and had a positive response.  However, yesterday he began showing signs or worsening cholecystitis confirmed by CT imaging.  Surgery has asked [place] to evaluate for perc chole tube placement as he is not a surgical candidate.   I reviewed his CT, he definitely has worsening acute calculous cholecystitis.  ASA and Plavix take at least 5 days to wash out, waiting that long would pose a greater risk from sepsis than the increased risk of bleeding from the agents.   He is oriented x2 and clearly complains of severe RUQ pain. His daughter, Darl Pikes, is his Management consultant.   Past Medical History:  Diagnosis Date   Arthritis    Carpal tunnel syndrome on both sides    GERD (gastroesophageal reflux disease)    Hyperlipidemia    Hypertension     Past Surgical History:  Procedure Laterality Date   ENDARTERECTOMY Right 03/19/2023   Procedure: ENDARTERECTOMY CAROTID;  Surgeon: Renford Dills, MD;  Location: [facility] ORS;  Service: Vascular;  Laterality: Right;   INGUINAL HERNIA REPAIR  2001   REPLACEMENT TOTAL KNEE Bilateral     Allergies: Patient has no known allergies.  Medications: Prior to Admission medications   Medication Sig Start Date End Date Taking? Authorizing Provider  acetaminophen (TYLENOL) 325 MG tablet Take 2 tablets (650 mg total) by mouth every 6 (six) hours as needed for fever, headache or moderate pain. 05/31/20  Yes Wieting, Richard, MD  bisacodyl (DULCOLAX) 10 MG suppository Place 1 suppository  (10 mg total) rectally daily as needed for severe constipation. 02/19/23  Yes Gillis Santa, MD  bisacodyl (DULCOLAX) 5 MG EC tablet Take 2 tablets (10 mg total) by mouth at bedtime. 02/19/23  Yes Gillis Santa, MD  pravastatin (PRAVACHOL) 40 MG tablet Take 1 tablet by mouth daily. 03/05/23  Yes [provider]  sertraline (ZOLOFT) 25 MG tablet Take 1 tablet by mouth daily. 03/05/23  Yes [provider]  amoxicillin-clavulanate (AUGMENTIN) 875-125 MG tablet Take 1 tablet by mouth every 12 (twelve) hours for 5 days. 04/16/23 04/21/23  Gillis Santa, MD  aspirin 81 MG chewable tablet Chew 1 tablet (81 mg total) by mouth daily. 04/17/23   Gillis Santa, MD  aspirin EC 81 MG tablet Take by mouth. Patient not taking: Reported on 05/20/2021    [provider]  atorvastatin (LIPITOR) 40 MG tablet Take 1 tablet (40 mg total) by mouth daily. 04/17/23   Gillis Santa, MD  clopidogrel (PLAVIX) 75 MG tablet Take 1 tablet (75 mg total) by mouth daily. Total 90 days from Sep 16 to Dec 14th as per Neurologist 02/20/23 04/24/23  Gillis Santa, MD  cyanocobalamin (VITAMIN B12) 1000 MCG tablet Take 1,000 mcg by mouth daily. 09/19/21   [provider]  lovastatin (MEVACOR) 40 MG tablet Take by mouth. 03/14/12   [provider]  pantoprazole (PROTONIX) 40 MG tablet Take 1 tablet (40 mg total) by mouth daily. 02/20/23   Gillis Santa, MD  polyethylene glycol (MIRALAX / GLYCOLAX) 17 g packet Take 17 g  by mouth daily. 02/19/23   Gillis Santa, MD  senna (SENOKOT) 8.6 MG TABS tablet Take 1 tablet (8.6 mg total) by mouth at bedtime as needed for mild constipation. 05/24/21   Pennie Banter, DO  tamsulosin (FLOMAX) 0.4 MG CAPS capsule Take 1 capsule (0.4 mg total) by mouth daily after supper. 04/16/23   Gillis Santa, MD     Family History  Problem Relation Age of Onset   Cancer Sister        Liver cancer   Melanoma Sister    Cancer Brother        Pancreatic   Cancer Brother         Lung cancer   Heart disease Brother     Social History   Socioeconomic History   Marital status: Widowed    Spouse name: Not on file   Number of children: Not on file   Years of education: Not on file   Highest education level: Not on file  Occupational History   Not on file  Tobacco Use   Smoking status: Never   Smokeless tobacco: Never  Vaping Use   Vaping status: Never Used  Substance and Sexual Activity   Alcohol use: Never   Drug use: Never   Sexual activity: Not on file  Other Topics Concern   Not on file  Social History Narrative   Not on file   Social Determinants of Health   Financial Resource Strain: Low Risk  (10/12/2022)   Received from [facility]   Overall Financial Resource Strain (CARDIA)    Difficulty of Paying Living Expenses: Not hard at all  Food Insecurity: No Food Insecurity (03/12/2023)   Hunger Vital Sign    Worried About Running Out of Food in the Last Year: Never true    Ran Out of Food in the Last Year: Never true  Transportation Needs: No Transportation Needs (03/12/2023)   PRAPARE - Administrator, Civil Service (Medical): No    Lack of Transportation (Non-Medical): No  Physical Activity: Not on file  Stress: Not on file  Social Connections: Not on file    Review of Systems: A 12 point ROS discussed and pertinent positives are indicated in the HPI above.  All other systems are negative.  Review of Systems  Vital Signs: BP 117/71 (BP Location: Right Arm)   Pulse 67   Temp 98 F (36.7 C) (Oral)   Resp 13   Ht 5\' 8"  (1.727 m)   Wt 73.2 kg Comment: From 02/19/23  SpO2 95%   BMI 24.54 kg/m    Physical Exam Constitutional:      General: He is not in acute distress.    Appearance: Normal appearance. He is ill-appearing.  HENT:     Head: Normocephalic and atraumatic.  Eyes:     General: No scleral icterus. Cardiovascular:     Rate and Rhythm: Tachycardia present.  Pulmonary:     Effort:  Pulmonary effort is normal.  Abdominal:     General: Abdomen is flat.     Palpations: Abdomen is soft.     Tenderness: There is abdominal tenderness. There is guarding.  Skin:    General: Skin is warm and dry.  Neurological:     Mental Status: He is alert. He is disoriented.     Imaging: CT ABDOMEN PELVIS WO CONTRAST  Result Date: 04/16/2023 CLINICAL DATA:  Abdominal pain.  Small bowel obstruction suspected. EXAM: CT ABDOMEN AND PELVIS WITHOUT CONTRAST  TECHNIQUE: Multidetector CT imaging of the abdomen and pelvis was performed following the standard protocol without IV contrast. RADIATION DOSE REDUCTION: This exam was performed according to the departmental dose-optimization program which includes automated exposure control, adjustment of the mA and/or kV according to patient size and/or use of iterative reconstruction technique. COMPARISON:  Abdominopelvic CT 04/11/2023 and 02/14/2023. FINDINGS: Lower chest: Stable mild chronic fibrotic changes at both lung bases. No superimposed airspace disease, pleural effusion or pneumothorax. The heart is mildly enlarged. There is aortic and coronary artery atherosclerosis with aortic valvular and mitral annular calcifications. There is a small to moderate hiatal hernia. Hepatobiliary: The liver appears unremarkable as imaged in the noncontrast state. Small gallstones again noted. There is increased gallbladder wall thickening and surrounding inflammation suspicious for cholecystitis. No evidence of biliary ductal dilatation. Pancreas: Mild atrophy. No ductal dilatation or surrounding inflammation. Spleen: Normal in size without focal abnormality. Adrenals/Urinary Tract: Both adrenal glands appear normal. Unchanged bilateral renal cysts, larger on the left, for which no specific follow-up imaging is recommended. There is no evidence of urinary tract calculus or hydronephrosis. Foley catheter remains in place without apparent bladder abnormality. Stomach/Bowel:  No enteric contrast administered. As above, there is a stable small to moderate hiatal hernia. The stomach otherwise appears unremarkable for its degree of distention. No evidence of bowel distension, wall thickening or surrounding inflammation. Moderate stool throughout the colon. Nonvisualization of the appendix, as before. Vascular/Lymphatic: There are no enlarged abdominal or pelvic lymph nodes. Aortic and branch vessel atherosclerosis with stable 4.2 cm infrarenal abdominal aortic aneurysm. Reproductive: Mild enlargement of the prostate gland. Other: Stable small umbilical hernia containing only fat. No ascites or pneumoperitoneum. Musculoskeletal: No acute or significant osseous findings. Multilevel spondylosis with chronic compression deformities. IMPRESSION: 1. Increased gallbladder wall thickening and surrounding inflammation suspicious for acute cholecystitis. Correlate clinically. 2. No evidence of bowel obstruction or other acute abdominopelvic findings. No other significant changes are seen. 3. Stable 4.2 cm infrarenal abdominal aortic aneurysm. Abdominal aortic aneurysm measuring 4.2 cm. Recommend CTA or MRA, as appropriate in this 89 year old, in 12 months and referral to vascular specialist. Reference: Journal of Vascular Surgery 67.1 (2018): 2-77. J Am Coll Radiol 2013;10:789-794. 4.  Aortic Atherosclerosis (ICD10-I70.0). Electronically Signed   By: Carey Bullocks M.D.   On: 04/16/2023 15:49   CT ABDOMEN PELVIS WO CONTRAST  Result Date: 04/11/2023 CLINICAL DATA:  Bowel obstruction suspected. Nausea and vomiting. Constipation. EXAM: CT ABDOMEN AND PELVIS WITHOUT CONTRAST TECHNIQUE: Multidetector CT imaging of the abdomen and pelvis was performed following the standard protocol without IV contrast. RADIATION DOSE REDUCTION: This exam was performed according to the departmental dose-optimization program which includes automated exposure control, adjustment of the mA and/or kV according to patient  size and/or use of iterative reconstruction technique. COMPARISON:  Abdominal radiographs 04/10/2023.  CT 02/14/2023 FINDINGS: Lower chest: Slight fibrosis in the lung bases. Cardiac enlargement. Moderate-sized esophageal hiatal hernia. Hepatobiliary: Cholelithiasis with gas-filled stones in the gallbladder. Mild gallbladder wall thickening and stranding may indicate acute cholecystitis in the appropriate clinical setting. No focal liver lesions. Pancreas: Unremarkable. No pancreatic ductal dilatation or surrounding inflammatory changes. Spleen: Normal in size without focal abnormality. Adrenals/Urinary Tract: No adrenal gland nodules. Bilateral renal cysts. Largest on the left is 7.4 cm in diameter. No change since prior study. No imaging follow-up is indicated. No hydronephrosis or hydroureter. Bladder is decompressed with a Foley catheter in place. Stomach/Bowel: Stomach, small bowel, and colon are not abnormally distended. Stool fills the colon. No wall  thickening or inflammatory changes are appreciated. Appendix is not identified. Vascular/Lymphatic: Calcification of the aorta. Infrarenal abdominal aortic aneurysm measuring 4.2 cm AP dimension. No change since prior study. No significant lymphadenopathy. Reproductive: Prostate is unremarkable. Other: No abdominal wall hernia or abnormality. No abdominopelvic ascites. Musculoskeletal: Degenerative changes in the spine. No acute bony abnormalities. IMPRESSION: 1. Cholelithiasis with gallbladder wall thickening and mild stranding. Changes may represent acute cholecystitis in the appropriate clinical setting. 2. Abdominal aortic aneurysm measuring 4.2 cm. Recommend CTA or MRA, as appropriate, in 12 months and referral to vascular specialist. Reference: Journal of Vascular Surgery 67.1 (2018): 2-77. J Am Coll Radiol 2013;10:789-794. 3. Aortic atherosclerosis. 4. Slight fibrosis in the lung bases. Moderate esophageal hiatal hernia. Electronically Signed   By: Burman Nieves M.D.   On: 04/11/2023 19:50   CT HEAD CODE STROKE WO CONTRAST`  Result Date: 04/11/2023 CLINICAL DATA:  Code stroke. Provided history: Mental status change, unknown cause. EXAM: CT HEAD WITHOUT CONTRAST TECHNIQUE: Contiguous axial images were obtained from the base of the skull through the vertex without intravenous contrast. RADIATION DOSE REDUCTION: This exam was performed according to the departmental dose-optimization program which includes automated exposure control, adjustment of the mA and/or kV according to patient size and/or use of iterative reconstruction technique. COMPARISON:  Head CT 03/11/2023.  Brain MRI 03/11/2023. FINDINGS: Brain: Generalized cerebral atrophy. Prominence of the ventricles and sulci appears commensurate Advanced patchy and confluent hypoattenuation within the cerebral white matter, nonspecific but compatible chronic small vessel ischemic disease. 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 There is no acute intracranial hemorrhage. No demarcated cortical infarct. No extra-axial fluid collection. No evidence of an intracranial mass. No midline shift. Vascular: No hyperdense vessel.  Atherosclerotic calcifications. Skull: No calvarial fracture or aggressive osseous lesion. Sinuses/Orbits: No mass or acute finding within the imaged orbits. Mild bilateral ethmoid sinusitis. ASPECTS [facility] Stroke Program Early CT Score) - Ganglionic level infarction (caudate, lentiform nuclei, internal capsule, insula, M1-M3 cortex): 7 - Supraganglionic infarction (M4-M6 cortex): 3 Total score (0-10 with 10 being normal): 10 (when discounting chronic infarcts). No evidence of an acute intracranial abnormality. These results were communicated to Dr. Otelia Limes at 6:28 pmon 12/1/2024by text page via the [facility] messaging system. IMPRESSION: 1. No evidence of an acute intracranial abnormality. 2. Parenchymal atrophy with advanced chronic small vessel  ischemic disease and multiple chronic infarcts as described. 3. Mild bilateral ethmoid sinusitis. Electronically Signed   By: Jackey Loge D.O.   On: 04/11/2023 18:29   DG Abd 1 View  Result Date: 04/10/2023 CLINICAL DATA:  89 year old male with history of vomiting. EXAM: ABDOMEN - 1 VIEW COMPARISON:  Abdominal radiograph 03/21/2023. FINDINGS: The bowel gas pattern is normal. No radio-opaque calculi or other significant radiographic abnormality are seen. IMPRESSION: Negative. Electronically Signed   By: Trudie Reed M.D.   On: 04/10/2023 06:24   DG Abd 1 View  Result Date: 03/21/2023 CLINICAL DATA:  Vomiting and weakness for 2 days. EXAM: ABDOMEN - 1 VIEW COMPARISON:  CT abdomen pelvis dated 02/14/2023. FINDINGS: There is a moderate amount of stool in the rectum. No dilated loops of bowel. Air-fluid levels and free intraperitoneal air can not be excluded on the supine exam. No radiopaque calculi in the expected location of the urinary tract. Degenerative changes are seen in the spine. IMPRESSION: Moderate amount of stool in the rectum. No dilated loops of bowel. Electronically Signed   By: Romona Curls M.D.   On: 03/21/2023 17:36  Labs:  CBC: Recent Labs    04/14/23 0438 04/15/23 1053 04/16/23 0450 04/17/23 0347  WBC 11.9* 9.7 11.5* 14.4*  HGB 11.1* 12.2* 11.5* 11.9*  HCT 34.3* 37.4* 35.6* 36.8*  PLT 285 325 325 336    COAGS: Recent Labs    02/13/23 0540 03/11/23 1359  INR 1.2 1.1  APTT  --  32    BMP: Recent Labs    04/14/23 0438 04/15/23 1053 04/16/23 0450 04/17/23 0347  NA 137 141 142 143  K 3.2* 3.5 3.3* 3.9  CL 104 107 107 108  CO2 24 23 25 25   GLUCOSE 111* 153* 97 119*  BUN 24* 30* 24* 25*  CALCIUM 8.4* 8.9 8.8* 8.8*  CREATININE 1.18 1.28* 1.11 1.20  GFRNONAA 59* 53* >60 57*    LIVER FUNCTION TESTS: Recent Labs    03/11/23 1202 04/10/23 0443 04/11/23 1913 04/12/23 0638  BILITOT 0.7 0.8 1.1 1.1  AST 21 19 18 15   ALT 18 14 13 13   ALKPHOS 98  104 104 87  PROT 6.8 7.0 7.4 6.3*  ALBUMIN 3.7 3.4* 3.2* 2.8*    TUMOR MARKERS: No results for input(s): "AFPTM", "CEA", "CA199", "CHROMGRNA" in the last 8760 hours.  Assessment and Plan:  89 yo male with acute calculous cholecystitis not responding to conservative measures.  He is not a candidate for surgery.  Therefore, percutaneous cholecystectomy is indicated.  He is on ASA and plavix for CVA posing an increased risk for bleeding, therefore a transperitoneal apporach will be used instead of transhepatic.   Risks, benefits and alternatives to the procedure discussed with his daughter who is his medical decision maker.  She understands and gives consent to proceed.     Thank you for this interesting consult.  I greatly enjoyed meeting Thomas [name] and look forward to participating in their care.  A copy of this report was sent to the requesting provider on this date.  Electronically Signed: Sterling Big, MD 04/17/2023, 10:03 AM   I spent a total of 40 Minutes   in face to face in clinical consultation, greater than 50% of which was counseling/coordinating care for acute calculous cholecystitis

## 2023-04-17 NOTE — Procedures (Signed)
Interventional Radiology Procedure Note  Procedure: Placement of a 35F percutaneous cholecystostomy tube with evacuation of 60 mL purulent bile.  Samples sent for culture  Complications: None  Estimated Blood Loss: None  Recommendations: - Tube to gravity - Flush every 8 hrs  Signed,  Sterling Big, MD

## 2023-04-18 DIAGNOSIS — I639 Cerebral infarction, unspecified: Secondary | ICD-10-CM | POA: Diagnosis not present

## 2023-04-18 DIAGNOSIS — K810 Acute cholecystitis: Secondary | ICD-10-CM | POA: Diagnosis not present

## 2023-04-18 LAB — CBC WITH DIFFERENTIAL/PLATELET
Abs Immature Granulocytes: 0.2 10*3/uL — ABNORMAL HIGH (ref 0.00–0.07)
Basophils Absolute: 0 10*3/uL (ref 0.0–0.1)
Basophils Relative: 0 %
Eosinophils Absolute: 0.1 10*3/uL (ref 0.0–0.5)
Eosinophils Relative: 1 %
HCT: 36 % — ABNORMAL LOW (ref 39.0–52.0)
Hemoglobin: 11.2 g/dL — ABNORMAL LOW (ref 13.0–17.0)
Immature Granulocytes: 2 %
Lymphocytes Relative: 12 %
Lymphs Abs: 1.2 10*3/uL (ref 0.7–4.0)
MCH: 29.2 pg (ref 26.0–34.0)
MCHC: 31.1 g/dL (ref 30.0–36.0)
MCV: 93.8 fL (ref 80.0–100.0)
Monocytes Absolute: 0.5 10*3/uL (ref 0.1–1.0)
Monocytes Relative: 5 %
Neutro Abs: 7.8 10*3/uL — ABNORMAL HIGH (ref 1.7–7.7)
Neutrophils Relative %: 80 %
Platelets: 341 10*3/uL (ref 150–400)
RBC: 3.84 MIL/uL — ABNORMAL LOW (ref 4.22–5.81)
RDW: 14 % (ref 11.5–15.5)
WBC: 9.7 10*3/uL (ref 4.0–10.5)
nRBC: 0 % (ref 0.0–0.2)

## 2023-04-18 LAB — BASIC METABOLIC PANEL
Anion gap: 10 (ref 5–15)
BUN: 38 mg/dL — ABNORMAL HIGH (ref 8–23)
CO2: 24 mmol/L (ref 22–32)
Calcium: 8.7 mg/dL — ABNORMAL LOW (ref 8.9–10.3)
Chloride: 110 mmol/L (ref 98–111)
Creatinine, Ser: 1.54 mg/dL — ABNORMAL HIGH (ref 0.61–1.24)
GFR, Estimated: 43 mL/min — ABNORMAL LOW (ref >60)
Glucose, Bld: 118 mg/dL — ABNORMAL HIGH (ref 70–99)
Potassium: 3.6 mmol/L (ref 3.5–5.1)
Sodium: 144 mmol/L (ref 135–145)

## 2023-04-18 MED ORDER — SMOG ENEMA
960.0000 mL | Freq: Once | RECTAL | Status: DC
  Filled 2023-04-18: qty 960

## 2023-04-18 MED ORDER — POLYETHYLENE GLYCOL 3350 17 G PO PACK
17.0000 g | PACK | Freq: Two times a day (BID) | ORAL | Status: DC
  Administered 2023-04-18 – 2023-04-19 (×2): 17 g via ORAL
  Filled 2023-04-18 (×2): qty 1

## 2023-04-18 MED ORDER — SODIUM CHLORIDE 0.9 % IV SOLN: INTRAVENOUS | Status: AC

## 2023-04-18 MED ORDER — BISACODYL 10 MG RE SUPP
10.0000 mg | Freq: Once | RECTAL | Status: AC
  Administered 2023-04-18: 10 mg via RECTAL
  Filled 2023-04-18: qty 1

## 2023-04-18 MED ORDER — SMOG ENEMA
960.0000 mL | Freq: Once | RECTAL | Status: AC
  Administered 2023-04-18: 960 mL via RECTAL
  Filled 2023-04-18: qty 960

## 2023-04-18 NOTE — Plan of Care (Signed)
  Problem: Education: Goal: Knowledge of disease or condition will improve Outcome: Progressing   Problem: Ischemic Stroke/TIA Tissue Perfusion: Goal: Complications of ischemic stroke/TIA will be minimized Outcome: Progressing   Problem: Coping: Goal: Will verbalize positive feelings about self Outcome: Progressing   Problem: Health Behavior/Discharge Planning: Goal: Ability to manage health-related needs will improve Outcome: Progressing   Problem: Self-Care: Goal: Ability to participate in self-care as condition permits will improve Outcome: Progressing   Problem: Nutrition: Goal: Risk of aspiration will decrease Outcome: Progressing   Problem: Education: Goal: Knowledge of General Education information will improve Description: Including pain rating scale, medication(s)/side effects and non-pharmacologic comfort measures Outcome: Progressing   Problem: Activity: Goal: Risk for activity intolerance will decrease Outcome: Progressing   Problem: Coping: Goal: Level of anxiety will decrease Outcome: Progressing   Problem: Elimination: Goal: Will not experience complications related to bowel motility Outcome: Progressing   Problem: Pain Management: Goal: General experience of comfort will improve Outcome: Progressing   Problem: Safety: Goal: Ability to remain free from injury will improve Outcome: Progressing   Problem: Skin Integrity: Goal: Risk for impaired skin integrity will decrease Outcome: Progressing   Problem: Education: Goal: Knowledge of discharge needs will improve Outcome: Progressing   Problem: Respiratory: Goal: Ability to achieve and maintain a regular respiratory rate will improve Outcome: Progressing   Problem: Skin Integrity: Goal: Demonstration of wound healing without infection will improve Outcome: Progressing

## 2023-04-18 NOTE — Progress Notes (Signed)
[facility] SURGICAL PROGRESS NOTE (cpt [phone])  Hospital Day(s): 66.   Interval History: Patient seen and examined, no acute events or new complaints overnight. Patient resting in bed, seems to have developed a bit more of a cough today. No fever. Leukocytosis improved; he had his drain placed yesterday morning, he continues on Zosyn.  Review of Systems:  Constitutional: denies fever, chills  HEENT: denies cough or congestion  Respiratory: denies any shortness of breath  Cardiovascular: denies chest pain or palpitations  Gastrointestinal: Reports some localized abdominal pain, denies N/V Genitourinary: denies burning with urination or urinary frequency Musculoskeletal: denies pain, decreased motor or sensation  Vital signs in last 24 hours: [min-max] current  Temp:  [97.5 F (36.4 C)-99.4 F (37.4 C)] 97.6 F (36.4 C) (12/08 0847) Pulse Rate:  [59-83] 59 (12/08 0847) Resp:  [16-20] 20 (12/08 0847) BP: (96-125)/(63-70) 124/69 (12/08 0847) SpO2:  [94 %-97 %] 97 % (12/08 0847)     Height: 5\' 8"  (172.7 cm) Weight: 73.2 kg (From 02/19/23)     Intake/Output last 2 shifts:  12/07 0701 - 12/08 0700 In: 15  Out: 975 [Urine:850; Drains:125]   Physical Exam:  Constitutional: alert, cooperative and no distress  HENT: normocephalic without obvious abnormality  Eyes: PERRL, EOM's grossly intact and symmetric  Respiratory: breathing non-labored at rest  Cardiovascular: regular rate and sinus rhythm  Gastrointestinal: soft, his abdomen is minimally tender this morning, just medial to his drain, which is draining and opaque bile tinged brown fluid.  Non-distended, no rebound/guarding  Genitourinary; Foley in place    Labs:     Latest Ref Rng & Units 04/18/2023    3:25 AM 04/17/2023    3:47 AM 04/16/2023    4:50 AM  CBC  WBC 4.0 - 10.5 K/uL 9.7  14.4  11.5   Hemoglobin 13.0 - 17.0 g/dL 60.4  54.0  98.1   Hematocrit 39.0 - 52.0 % 36.0  36.8  35.6   Platelets 150 - 400  K/uL 341  336  325       Latest Ref Rng & Units 04/18/2023    3:25 AM 04/17/2023    3:47 AM 04/16/2023    4:50 AM  CMP  Glucose 70 - 99 mg/dL 191  478  97   BUN 8 - 23 mg/dL 38  25  24   Creatinine 0.61 - 1.24 mg/dL 2.95  6.21  3.08   Sodium 135 - 145 mmol/L 144  143  142   Potassium 3.5 - 5.1 mmol/L 3.6  3.9  3.3   Chloride 98 - 111 mmol/L 110  108  107   CO2 22 - 32 mmol/L 24  25  25    Calcium 8.9 - 10.3 mg/dL 8.7  8.8  8.8      Imaging studies: No new pertinent imaging studies   Assessment/Plan: (ICD-10's: K81.0) 89 y.o. 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   He is no a surgical candidate given his multiple recent CVAs and need for Plavix.   - Okay to continue DYS diet as tolerated - Continue IV Abx;  - Monitor abdominal examination - Monitor volume, renal function.  - Mobilize as tolerated   - Further management per primary service   - Discharge Planning; From a gallbladder perspective, Nothing further from our perspective at this time. Abx as above, transition to  p.o. when able. We will remain available should clinical condition change.   All of the above findings and recommendations were discussed with the patient, and the medical team, and all of patient's questions were answered to his expressed satisfaction.   Campbell Lerner, M.D., [facility]  04/18/2023 ; 11:30 AM

## 2023-04-18 NOTE — Progress Notes (Signed)
[facility] Progress Note  Patient: Thomas [name]    ZOX:[id number]  DOA: 03/11/2023     Date of Service: the patient was seen and examined on 04/18/2023  Chief Complaint  Patient presents with   Weakness   Brief hospital course: Thomas [name] is a 89 y.o. male with medical history significant of hypertension, hyperlipidemia, GERD, recent CVA 3 weeks prior to presentation here, was treated at [facility] and d/c to rehab 1 week prior to presentation here. Was doing well but 1 night before presentation to [facility] family states the patient did not want to eat dinner and started complaining of dizziness. The morning of presentation patient was having trouble with coordination unable to raise himself out of bed. Primary caregiver stated that the patient was discharged on dual antiplatelet therapy aspirin and Plavix and has been compliant with all medications.    Hospital course / significant events:  10/31: admitted to hospitalist service for new CVA  11/05: cardiology clearance for surgery  11/08: R Carotid Endarterectomy  11/11: urology consult for traumatic Foley catheter self-removal, re-placed catheter Intermittent vomiting/nausea w/ negative w/u labs and KUB Placement difficulty 12/01-12/02: more lethargic, (+)SIRS, code stroke but no concern for new acute CVA, workup revealed likely cholecystitis, started Zosyn, gen surg advises to continue abx and monitor, pt appears improved no plans for surgical intervention, would consider chole drain w/ IR if needed      Consultants:  Neurology Vascular Surgery Cardiology Urology  General Surgery    Procedures/Surgeries: 11/08: R Carotid Endarterectomy  12/7 Placement of a 53F percutaneous cholecystostomy tube with evacuation of 60 mL purulent bile.  Samples sent for culture    Assessment and Plan:  # Sepsis d/t Cholecystitis - sepsis resolved Continue IV Zosyn No procedures planned at this time given clinical improvement but if deterioration  would opt for cholecystostomy tube over surgery if possible given overall functional status and given DAPT  General surgery was consulted, recommended conservative management, signed off. 12/6 c/o decreased appetite and abdominal discomfort. CT scan abdomen showed worsening of gallbladder wall thickening and cholecystitis Discussed with general surgery, recommended percutaneous biliary drain by IR, keep n.p.o. after midnight and IR will do percutaneous biliary drain tomorrow a.m. Continue IV Zosyn in the meantime, we will switch to oral antibiotics on discharge, for total 10-day course Leukocytosis resolved, WBC 9.7  # Acute CVA w/ Hx recent previous CVA on Statin + DAPT  Right carotid stenosis S/p right carotid endarterectomy on 11/8 cont ASA cont plavix (resumed on 11/22) cont Lipitor 40   # Hematuria 2/2 penile trauma w/ traumatic foley catheter self-removal Chronic Foley d/t BPH w/ urinary retenetion  per nursing, pt was confused post-op and pulled out his Foley.   urology consulted monitor for hematuria Foley removed 11/29 for voiding trial.  Per RN, pt was having difficult voiding 11/30, ICU charge reinserted coude Foley 11/30 cont Flomax (new) outpatient f/u with [facility]    # AKI - 12/8 again creatinine elevated most likely due to dehydration Monitor BMP Po hydration encouraged Maintain Foley as above  12/6 Cr 1.11 elevated, could be dehydration.  RN was advised to encourage him for oral fluid intake. 12/8 Cr 1.5, continue IV fluid for hydration   # h/p Essential hypertension BP has been low during hospitalization.  Patient is not on any antihypertensive medication at home   Hyperlipidemia cont Lipitor   GERD cont PPI   Hospital delirium more confused past few days but better today w/ tx  for cholecystitis  supportive care   Hypokalemia Replace as needed Monitor BMP   Constipation Continue MiraLAX BID, Dulcolax at bedtime and Dulcolax suppository   prn 12/6 Smog enema one-time dose given 12/8 Dulcolax suppository x 1 dose followed by enema order placed  normal weight based on BMI:   Body mass index is 24.54 kg/m.  Interventions:  Diet: Dysphagia 3 diet DVT Prophylaxis: Lovenox  Advance goals of care discussion: DNR  Family Communication: family was not present at bedside, at the time of interview.  The pt provided permission to discuss medical plan with the family. Opportunity was given to ask question and all questions were answered satisfactorily.   Disposition:  Pt is from Home, admitted with CVA, s/p CEA and developed acute cholecystitis s/p pc cholecystostomy tube insertion on 12/7, still on IV antibiotics, gradually getting better, may be stable to discharge to SNF in 1 to 2 days Discharge to SNF, when bed will be available, most likely in 1 to 2 days.   Subjective: No significant events overnight, patient stated pain is off and on, sharp lasts only for few seconds and it stops him from from what ever he is doing.  Denies any nausea vomiting, appetite is gradually improving. Overall patient feels improvement after cholecystostomy tube insertion.   Physical Exam: General: NAD, lying comfortably Appear in no distress, affect appropriate Eyes: PERRLA ENT: Oral Mucosa Clear, moist, s/p Rt CEA, incision healing well Neck: no JVD,  Cardiovascular: S1 and S2 Present, no Murmur,  Respiratory: good respiratory effort, Bilateral Air entry equal and Decreased, no Crackles, no wheezes Abdomen: Bowel Sound present, Soft and mild RUQ tenderness, PC cholecystostomy tube intact Skin: no rashes Extremities: no Pedal edema, no calf tenderness Neurologic: Left-sided weakness power 3-4/5, CN grossly intact Gait not checked due to patient safety concerns  Vitals:   04/17/23 1628 04/17/23 2021 04/18/23 0507 04/18/23 0847  BP: 114/70 96/63 125/68 124/69  Pulse: 76 83 60 (!) 59  Resp: 16  18 20   Temp: 99.4 F (37.4 C) 98.5 F (36.9  C) (!) 97.5 F (36.4 C) 97.6 F (36.4 C)  TempSrc: Oral  Oral   SpO2: 94% 94% 95% 97%  Weight:      Height:        Intake/Output Summary (Last 24 hours) at 04/18/2023 1147 Last data filed at 04/18/2023 6440 Gross per 24 hour  Intake 15 ml  Output 1175 ml  Net -1160 ml    Filed Weights   04/11/23 2100  Weight: 73.2 kg    Data Reviewed: I have personally reviewed and interpreted daily labs, tele strips, imagings as discussed above. I reviewed all nursing notes, pharmacy notes, vitals, pertinent old records I have discussed plan of care as described above with RN and patient/family.  CBC: Recent Labs  Lab 04/14/23 0438 04/15/23 1053 04/16/23 0450 04/17/23 0347 04/18/23 0325  WBC 11.9* 9.7 11.5* 14.4* 9.7  NEUTROABS  --   --  10.0* 12.3* 7.8*  HGB 11.1* 12.2* 11.5* 11.9* 11.2*  HCT 34.3* 37.4* 35.6* 36.8* 36.0*  MCV 91.2 91.9 89.9 90.2 93.8  PLT 285 325 325 336 341   Basic Metabolic Panel: Recent Labs  Lab 04/14/23 0438 04/15/23 1053 04/16/23 0450 04/17/23 0347 04/18/23 0325  NA 137 141 142 143 144  K 3.2* 3.5 3.3* 3.9 3.6  CL 104 107 107 108 110  CO2 24 23 25 25 24   GLUCOSE 111* 153* 97 119* 118*  BUN 24* 30* 24* 25* 38*  CREATININE 1.18 1.28* 1.11 1.20 1.54*  CALCIUM 8.4* 8.9 8.8* 8.8* 8.7*  MG 2.4  --   --  2.5*  --   PHOS 2.9  --   --  3.5  --     Studies: No results found.  Scheduled Meds:  aspirin  81 mg Oral Daily   atorvastatin  40 mg Oral Daily   bisacodyl  10 mg Oral QHS   Chlorhexidine Gluconate Cloth  6 each Topical Daily   clopidogrel  75 mg Oral Daily   docusate  100 mg Oral Daily   enoxaparin (LOVENOX) injection  40 mg Subcutaneous Q24H   hydrocortisone cream   Topical BID   pantoprazole  40 mg Oral QHS   polyethylene glycol  17 g Oral Daily   sertraline  25 mg Oral Daily   sodium chloride flush  3 mL Intravenous Q12H   sodium chloride flush  5 mL Intracatheter Q8H   tamsulosin  0.4 mg Oral Daily   Continuous Infusions:   sodium chloride 75 mL/hr at 04/18/23 0926   piperacillin-tazobactam (ZOSYN)  IV 3.375 g (04/18/23 0510)   PRN Meds: acetaminophen **OR** acetaminophen, alum & mag hydroxide-simeth, bisacodyl, guaiFENesin-dextromethorphan, hydrALAZINE, iohexol, metoprolol tartrate, ondansetron, oxyCODONE, phenol  Time spent: 40 minutes  Author: Gillis Santa. MD [facility] 04/18/2023 11:47 AM  To reach On-call, see care teams to locate the attending and reach out to them via [email or web address]. If 7PM-7AM, please contact night-coverage If you still have difficulty reaching the attending provider, please page the [facility] (Director on Call) for [facility] on amion for assistance.

## 2023-04-19 DIAGNOSIS — K810 Acute cholecystitis: Secondary | ICD-10-CM | POA: Diagnosis not present

## 2023-04-19 DIAGNOSIS — I639 Cerebral infarction, unspecified: Secondary | ICD-10-CM | POA: Diagnosis not present

## 2023-04-19 DIAGNOSIS — R4182 Altered mental status, unspecified: Secondary | ICD-10-CM

## 2023-04-19 LAB — BASIC METABOLIC PANEL
Anion gap: 6 (ref 5–15)
BUN: 32 mg/dL — ABNORMAL HIGH (ref 8–23)
CO2: 26 mmol/L (ref 22–32)
Calcium: 8.3 mg/dL — ABNORMAL LOW (ref 8.9–10.3)
Chloride: 113 mmol/L — ABNORMAL HIGH (ref 98–111)
Creatinine, Ser: 1.22 mg/dL (ref 0.61–1.24)
GFR, Estimated: 56 mL/min — ABNORMAL LOW (ref >60)
Glucose, Bld: 94 mg/dL (ref 70–99)
Potassium: 3.3 mmol/L — ABNORMAL LOW (ref 3.5–5.1)
Sodium: 145 mmol/L (ref 135–145)

## 2023-04-19 LAB — CBC WITH DIFFERENTIAL/PLATELET
Abs Immature Granulocytes: 0.28 10*3/uL — ABNORMAL HIGH (ref 0.00–0.07)
Basophils Absolute: 0 10*3/uL (ref 0.0–0.1)
Basophils Relative: 1 %
Eosinophils Absolute: 0.2 10*3/uL (ref 0.0–0.5)
Eosinophils Relative: 2 %
HCT: 32 % — ABNORMAL LOW (ref 39.0–52.0)
Hemoglobin: 10.3 g/dL — ABNORMAL LOW (ref 13.0–17.0)
Immature Granulocytes: 4 %
Lymphocytes Relative: 18 %
Lymphs Abs: 1.3 10*3/uL (ref 0.7–4.0)
MCH: 29.5 pg (ref 26.0–34.0)
MCHC: 32.2 g/dL (ref 30.0–36.0)
MCV: 91.7 fL (ref 80.0–100.0)
Monocytes Absolute: 0.3 10*3/uL (ref 0.1–1.0)
Monocytes Relative: 5 %
Neutro Abs: 5.1 10*3/uL (ref 1.7–7.7)
Neutrophils Relative %: 70 %
Platelets: 324 10*3/uL (ref 150–400)
RBC: 3.49 MIL/uL — ABNORMAL LOW (ref 4.22–5.81)
RDW: 13.8 % (ref 11.5–15.5)
WBC: 7.3 10*3/uL (ref 4.0–10.5)
nRBC: 0 % (ref 0.0–0.2)

## 2023-04-19 MED ORDER — POLYVINYL ALCOHOL 1.4 % OP SOLN: 2.0000 [drp] | OPHTHALMIC | Status: AC | PRN

## 2023-04-19 MED ORDER — POLYVINYL ALCOHOL 1.4 % OP SOLN
2.0000 [drp] | OPHTHALMIC | Status: DC | PRN
  Administered 2023-04-19: 2 [drp] via OPHTHALMIC
  Filled 2023-04-19: qty 15

## 2023-04-19 MED ORDER — POTASSIUM CHLORIDE CRYS ER 20 MEQ PO TBCR
40.0000 meq | EXTENDED_RELEASE_TABLET | Freq: Once | ORAL | Status: AC
  Administered 2023-04-19: 40 meq via ORAL
  Filled 2023-04-19: qty 2

## 2023-04-19 MED ORDER — POLYETHYLENE GLYCOL 3350 17 G PO PACK: 17.0000 g | PACK | Freq: Two times a day (BID) | ORAL | Status: AC

## 2023-04-19 NOTE — Care Management Important Message (Signed)
Important Message  Patient Details  Name: Thomas [name] MRN: [id number] Date of Birth: January 17, 1931   Important Message Given:  Yes - Medicare IM     [name] 04/19/2023, 12:52 PM

## 2023-04-19 NOTE — Progress Notes (Signed)
Physical Therapy Re- Evaluation Patient Details Name: Thomas [name] MRN: [id number] DOB: 1930-05-19 Today's Date: 04/19/2023  History of Present Illness  Pt is a 89 y.o. male presenting to hospital 03/11/23 with generalized weakness, dizziness, and being off balance; came home from [facility] rehab 7 days prior after multifocal small vessel infarcts.  Imaging showing acute infarct of R anterior thalamus.  Pt admitted with acute CVA.  S/p 03/19/23 R CEA (PT/OT orders discontinued 03/19/23). Pt also noted with hematuria and acute urinary retention.  New PT/OT orders received 03/29/23.  PMH includes recent stroke 3 weeks ago, htn, HLD. Pt is s/p IR Perc cholecystostomy tube insertion on 12/7.   Clinical Impression  Patient in bed upon arrival, with eyes covered with washcloth. Patient agreeable to PT session despite pain reported. Patient require Mod A to roll in bed with use of bed rails, Max A for sidelying <> sit with assist at BLE and trunk this date. Patient limited due to pain in abdomen, refused additional mobility attempts. Patient able to sit EOB for approx 2-3 minutes before requesting to return to supine position. Assisted patient in repositioning for comfort. Pt left with all needs in reach and bed alarm set.  Patient will benefit from skilled acute PT services to address functional impairments (see below for additional) and maximize functional mobility. Discharge plan remains appropriate.  Will continue to follow acutely.        If plan is discharge home, recommend the following: Two people to help with walking and/or transfers;Two people to help with bathing/dressing/bathroom;Assistance with cooking/housework;Assistance with feeding;Direct supervision/assist for medications management;Assist for transportation;Supervision due to cognitive status;Help with stairs or ramp for entrance   Can travel by private vehicle   No    Equipment Recommendations Other (comment) (TBD at next level of care)   Recommendations for Other Services       Functional Status Assessment       Precautions / Restrictions Precautions Precautions: Fall Restrictions Weight Bearing Restrictions: No      Mobility  Bed Mobility Overal bed mobility: Needs Assistance Bed Mobility: Rolling, Sidelying to Sit, Sit to Sidelying Rolling: Mod assist, Used rails Sidelying to sit: Max assist, HOB elevated, Used rails Supine to sit: Max assist, Used rails     General bed mobility comments: Patient require assist at trunk for rolling, with multimodal cues, Mod A required. Completed sidelying <> sit with Max A, assist with BLE's and at trunk for full upright position. Pt verbalizing and grimacing in pain seated EOB with minimal improvements w/ repositioning.    Transfers                   General transfer comment: deferred this date due to pain    Ambulation/Gait                  Stairs            Wheelchair Mobility     Tilt Bed    Modified Rankin (Stroke Patients Only)       Balance Overall balance assessment: Needs assistance Sitting-balance support: Feet supported, Bilateral upper extremity supported Sitting balance-[name]: Fair                                       Pertinent Vitals/Pain Pain Assessment Pain Assessment: Faces Faces Pain Scale: Hurts whole lot Pain Location: Abdomen Pain Descriptors / Indicators: Guarding, Tender  Pain Intervention(s): Limited activity within patient's tolerance, Monitored during session    Home Living                          Prior Function                       Extremity/Trunk Assessment                Communication      Cognition Arousal: Alert Behavior During Therapy: WFL for tasks assessed/performed Overall Cognitive Status: History of cognitive impairments - at baseline Area of Impairment: Orientation, Safety/judgement                 Orientation Level: Person,  Place       Safety/Judgement: Decreased awareness of deficits              General Comments      Exercises     Assessment/Plan    PT Assessment    PT Problem List         PT Treatment Interventions      PT Goals (Current goals can be found in the Care Plan section)  Acute Rehab PT Goals PT Goal Formulation: With patient Time For Goal Achievement: 05/03/23 Potential to Achieve Goals: Fair    Frequency Min 1X/week     Co-evaluation               AM-PAC PT "6 Clicks" Mobility  Outcome Measure Help needed turning from your back to your side while in a flat bed without using bedrails?: A Lot Help needed moving from lying on your back to sitting on the side of a flat bed without using bedrails?: A Lot Help needed moving to and from a bed to a chair (including a wheelchair)?: A Lot Help needed standing up from a chair using your arms (e.g., wheelchair or bedside chair)?: Total Help needed to walk in hospital room?: Total Help needed climbing 3-5 steps with a railing? : Total 6 Click Score: 9    End of Session Equipment Utilized During Treatment: Gait belt Activity Tolerance: Patient limited by pain Patient left: in bed;with bed alarm set;with call bell/phone within reach Nurse Communication: Mobility status PT Visit Diagnosis: Unsteadiness on feet (R26.81);Other abnormalities of gait and mobility (R26.89);Repeated falls (R29.6);Muscle weakness (generalized) (M62.81);Hemiplegia and hemiparesis Hemiplegia - Right/Left: Left Hemiplegia - caused by: Cerebral infarction    Time: 1130-1145 PT Time Calculation (min) (ACUTE ONLY): 15 min   Charges:   PT Evaluation $PT Re-evaluation: 1 Re-eval   PT General Charges $$ ACUTE PT VISIT: 1 Visit         Howie Ill, PT, DPT 04/19/23 12:24 PM

## 2023-04-19 NOTE — TOC Transition Note (Addendum)
Transition of Care [facility]) - CM/SW Discharge Note   Patient Details  Name: Thomas [name] MRN: [id number] Date of Birth: 12-17-1930  Transition of Care [facility]) CM/SW Contact:  Allena Katz, LCSW Phone Number: 04/19/2023, 12:16 PM   Clinical Narrative:   Pt has orders to discharge to [facility]. RN given number for report. DC summary sent. Medical necessity printed and on unit. DNR signed and on the chart,    Final next level of care: Skilled Nursing Facility Barriers to Discharge: Barriers Resolved   Patient Goals and CMS Choice CMS Medicare.gov Compare Post Acute Care list provided to:: Patient Represenative (must comment) (Son)    Discharge Placement                Patient chooses bed at: [facility] Patient to be transferred to facility by: [facility] Name of family member notified: tim brown Patient and family notified of of transfer: 04/19/23  Discharge Plan and Services Additional resources added to the After Visit Summary for                                       Social Determinants of Health (SDOH) Interventions SDOH Screenings   Food Insecurity: No Food Insecurity (03/12/2023)  Housing: Low Risk  (03/12/2023)  Transportation Needs: No Transportation Needs (03/12/2023)  Utilities: Not At Risk (03/12/2023)  Financial Resource Strain: Low Risk  (10/12/2022)   Received from [facility]  Tobacco Use: Low Risk  (03/19/2023)  Health Literacy: High Risk (03/12/2023)   Received from [facility]     Readmission Risk Interventions     No data to display

## 2023-04-19 NOTE — Evaluation (Signed)
Clinical/Bedside Swallow Evaluation Patient Details  Name: Thomas [name] MRN: [id number] Date of Birth: 05-06-1931  Today's Date: 04/19/2023 Time: SLP Start Time (ACUTE ONLY): 1030 SLP Stop Time (ACUTE ONLY): 1100 SLP Time Calculation (min) (ACUTE ONLY): 30 min  Past Medical History:  Past Medical History:  Diagnosis Date   Arthritis    Carpal tunnel syndrome on both sides    GERD (gastroesophageal reflux disease)    Hyperlipidemia    Hypertension    Past Surgical History:  Past Surgical History:  Procedure Laterality Date   ENDARTERECTOMY Right 03/19/2023   Procedure: ENDARTERECTOMY CAROTID;  Surgeon: Renford Dills, MD;  Location: [facility] ORS;  Service: Vascular;  Laterality: Right;   INGUINAL HERNIA REPAIR  2001   IR PERC CHOLECYSTOSTOMY  04/17/2023   REPLACEMENT TOTAL KNEE Bilateral    HPI:  88yo male admitted 03/11/23 with weakness and dizzness. PMH: s/p CEA, acute cholecystitis, HTN, HLD, GERD, recent CVA. MRI = acute infarct right anterior thalamus, severe chronic small vessel disease with old infarcts bilateral thalami, basal ganglia, and bilateral cerebellar hemispheres.    Assessment / Plan / Recommendation  Clinical Impression  Pt presents with continued deconditioning in the setting of prolonged hospitalization. Wet voice quality noted upon arrival of SLP, however, pt was asleep initially. He appears significantly weak and reports rapid fatigue with minimal effort.   Pt accepted trials of ice chips, thin liquids via cup, puree, and softened solids. (No straws recommended following previous BSE, however, 2 cups with straws were on pt bedside table within reach). Extended oral prep observed following all presentations. Intermittent wet voice quality noted, which cleared well with cued throat clear. No cough response elicited during this session.   At this time, recommend downgrading diet to dysphagia 2 (finely chopped, moistened solids) primarily for energy conservation. Pt  has several safe swallow precautions that are posted in pt room. It is imperative that these strategies are followed to maximize safe and adequate PO intake.   Recommend consult with dietician for suggestions for supplements. Also recommend follow up with speech therapy at next level of care for education and to determine readiness to advance textures to allow more full range of choices to maximize intake.  SLP Visit Diagnosis: Dysphagia, unspecified (R13.10)    Aspiration Risk  Moderate aspiration risk;Risk for inadequate nutrition/hydration    Diet Recommendation Dysphagia 2 (Fine chop);Thin liquid    Liquid Administration via: Cup; NO STRAWS!!  Medication Administration: Crushed with puree  Supervision: Patient able to self feed;Staff to assist with self feeding;Full supervision/cueing for compensatory strategies  Compensations:   NO STRAWS 1:1 assist/supervision with PO intake for energy conservation Minimize environmental distractions Slow rate Small sips/bites Clear throat intermittently  Postural Changes: Seated upright at 90 degrees;Remain upright for at least 30 minutes after po intake    Other  Recommendations Oral Care Recommendations: Oral care BID;Oral care before and after PO;Staff/trained caregiver to provide oral care    Recommendations for follow up therapy are one component of a multi-disciplinary discharge planning process, led by the attending physician.  Recommendations may be updated based on patient status, additional functional criteria and insurance authorization.  Follow up Recommendations Follow physician's recommendations for discharge plan and follow up therapies      Assistance Recommended at Discharge  24 hour supervision  Functional Status Assessment Patient has had a recent decline in their functional status and demonstrates the ability to make significant improvements in function in a reasonable and predictable amount of time.  Frequency and  Duration min 2x/week  1 week;2 weeks       Prognosis Prognosis for improved oropharyngeal function: Fair Barriers to Reach Goals: Cognitive deficits;Language deficits;Time post onset;Severity of deficits Barriers/Prognosis Comment: baseline Cognitive decline per chart; missing lower Dentition; slow to self-feed d/t weakness and deconditioning      Swallow Study   General Date of Onset: 03/11/23 HPI: 89yo male admitted 03/11/23 with weakness and dizzness. PMH: s/p CEA, acute cholecystitis, HTN, HLD, GERD, recent CVA. MRI = acute infarct right anterior thalamus, severe chronic small vessel disease with old infarcts bilateral thalami, basal ganglia, and bilateral cerebellar hemispheres. Type of Study: Bedside Swallow Evaluation Previous Swallow Assessment: BSE 03/12/23 = D3/thin w/ well-Cut meats, moistened foods easy to chew; Thin liquids -- carefully monitor and pt should Hold Cup when drinking. Use of the Dysphagia drink cup recommended. Dtr updated on this and agreed. Recommend general aspiration precautions, sitting fully upright w/ all oral intake -- NO STRAWS. Reduce distractions during meals. Small bites/sips slowly.  Diet Prior to this Study: Regular;Thin liquids (Level 0) Temperature Spikes Noted: No Respiratory Status: Room air History of Recent Intubation: No Behavior/Cognition: Alert;Cooperative;Pleasant mood;Distractible;Requires cueing Oral Cavity Assessment: Within Functional Limits Oral Care Completed by SLP: No Oral Cavity - Dentition: Missing dentition Vision: Impaired for self-feeding (due to stinging eyes) Self-Feeding Abilities: Able to feed self;Needs set up;Needs assist Patient Positioning: Upright in bed Baseline Vocal Quality:  (low vocal intensity, intermittent wet voice quality. Cued throat clear effectively cleared voice quality) Volitional Cough: Weak Volitional Swallow: Able to elicit    Oral/Motor/Sensory Function Overall Oral Motor/Sensory Function:  Generalized oral weakness   Ice Chips Ice chips: Impaired Presentation: Spoon Oral Phase Functional Implications: Prolonged oral transit Pharyngeal Phase Impairments: Suspected delayed Swallow;Decreased hyoid-laryngeal movement   Thin Liquid Presentation: Cup;Self Fed (assisted patient with holding cup) Other Comments: single sips slowly via cup    Nectar Thick Nectar Thick Liquid: Not tested   Honey Thick Honey Thick Liquid: Not tested   Puree Puree: Impaired Presentation: Spoon Oral Phase Functional Implications: Prolonged oral transit Pharyngeal Phase Impairments: Suspected delayed Swallow;Decreased hyoid-laryngeal movement   Solid     Solid: Impaired Presentation: Spoon Oral Phase Functional Implications: Prolonged oral transit;Impaired mastication Pharyngeal Phase Impairments: Suspected delayed Swallow;Decreased hyoid-laryngeal movement     Christne Platts B. Murvin Natal, MSP, CCC-SLP Speech Language Pathologist  [facility] 04/19/2023,11:27 AM

## 2023-04-19 NOTE — Progress Notes (Signed)
[facility] SURGICAL PROGRESS NOTE (cpt [id number])  Hospital Day(s): 49.   Interval History: Patient seen and examined, he is alert and oriented to person and place, but is unsure about what year it is. He does report mild abdominal pain. He has been eating. Plan for him to leave today.   Review of Systems:  Constitutional: denies fever, chills  HEENT: denies cough or congestion  Respiratory: denies any shortness of breath  Cardiovascular: denies chest pain or palpitations  Gastrointestinal: Reports some localized abdominal pain, denies N/V Genitourinary: denies burning with urination or urinary frequency Musculoskeletal: denies pain, decreased motor or sensation  Vital signs in last 24 hours: [min-max] current  Temp:  [97.6 F (36.4 C)-98.1 F (36.7 C)] 97.7 F (36.5 C) (12/09 0840) Pulse Rate:  [53-64] 54 (12/09 0840) Resp:  [16-19] 19 (12/09 0840) BP: (109-144)/(61-86) 144/86 (12/09 0840) SpO2:  [96 %-98 %] 98 % (12/09 0840)     Height: 5\' 8"  (172.7 cm) Weight: 73.2 kg (From 02/19/23)     Intake/Output last 2 shifts:  12/08 0701 - 12/09 0700 In: 1171 [P.O.:360; I.V.:522.9; IV Piggyback:288.1] Out: 500 [Urine:400; Drains:100]   Physical Exam:  Constitutional: alert, cooperative and no distress  HENT: normocephalic without obvious abnormality  Eyes: PERRL, EOM's grossly intact and symmetric  Respiratory: breathing non-labored at rest  Cardiovascular: regular rate and sinus rhythm  Gastrointestinal: soft, his abdomen is minimally tender this morning, some mild tendenress at drain site, slightly distended but improves when patient releaxes  Genitourinary; Foley in place    Labs:     Latest Ref Rng & Units 04/19/2023    3:14 AM 04/18/2023    3:25 AM 04/17/2023    3:47 AM  CBC  WBC 4.0 - 10.5 K/uL 7.3  9.7  14.4   Hemoglobin 13.0 - 17.0 g/dL 19.1  47.8  29.5   Hematocrit 39.0 - 52.0 % 32.0  36.0  36.8   Platelets 150 - 400 K/uL 324  341  336       Latest  Ref Rng & Units 04/19/2023    3:14 AM 04/18/2023    3:25 AM 04/17/2023    3:47 AM  CMP  Glucose 70 - 99 mg/dL 94  621  308   BUN 8 - 23 mg/dL 32  38  25   Creatinine 0.61 - 1.24 mg/dL 6.57  8.46  9.62   Sodium 135 - 145 mmol/L 145  144  143   Potassium 3.5 - 5.1 mmol/L 3.3  3.6  3.9   Chloride 98 - 111 mmol/L 113  110  108   CO2 22 - 32 mmol/L 26  24  25    Calcium 8.9 - 10.3 mg/dL 8.3  8.7  8.8      Imaging studies: No new pertinent imaging studies   Assessment/Plan: (ICD-10's: K81.0) 89 y.o. 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   He is no a surgical candidate given his multiple recent CVAs and need for Plavix.   - Okay to continue DYS diet as tolerated - Continue abx, okay to give oral and complete total of 14 days of abx  - Monitor abdominal examination - Monitor volume, renal function.  - Mobilize as tolerated   - Further management per primary service   Please follow up with surgery in 4-6 weeks  Baker Pierini, M.D. [facility]   04/19/2023 ; 12:41 PM

## 2023-04-20 LAB — AEROBIC/ANAEROBIC CULTURE W GRAM STAIN (SURGICAL/DEEP WOUND)

## 2023-04-21 ENCOUNTER — Emergency Department: Payer: Medicare (Managed Care)

## 2023-04-21 DIAGNOSIS — N1330 Unspecified hydronephrosis: Secondary | ICD-10-CM | POA: Diagnosis present

## 2023-04-21 DIAGNOSIS — Z800 Family history of malignant neoplasm of digestive organs: Secondary | ICD-10-CM

## 2023-04-21 DIAGNOSIS — R338 Other retention of urine: Secondary | ICD-10-CM | POA: Diagnosis present

## 2023-04-21 DIAGNOSIS — E870 Hyperosmolality and hypernatremia: Secondary | ICD-10-CM | POA: Diagnosis present

## 2023-04-21 DIAGNOSIS — I130 Hypertensive heart and chronic kidney disease with heart failure and stage 1 through stage 4 chronic kidney disease, or unspecified chronic kidney disease: Secondary | ICD-10-CM | POA: Diagnosis present

## 2023-04-21 DIAGNOSIS — R310 Gross hematuria: Secondary | ICD-10-CM | POA: Diagnosis present

## 2023-04-21 DIAGNOSIS — K219 Gastro-esophageal reflux disease without esophagitis: Secondary | ICD-10-CM | POA: Diagnosis present

## 2023-04-21 DIAGNOSIS — Z79899 Other long term (current) drug therapy: Secondary | ICD-10-CM

## 2023-04-21 DIAGNOSIS — F32A Depression, unspecified: Secondary | ICD-10-CM | POA: Diagnosis present

## 2023-04-21 DIAGNOSIS — Z7902 Long term (current) use of antithrombotics/antiplatelets: Secondary | ICD-10-CM

## 2023-04-21 DIAGNOSIS — R6511 Systemic inflammatory response syndrome (SIRS) of non-infectious origin with acute organ dysfunction: Secondary | ICD-10-CM | POA: Diagnosis present

## 2023-04-21 DIAGNOSIS — T83021A Displacement of indwelling urethral catheter, initial encounter: Secondary | ICD-10-CM | POA: Diagnosis present

## 2023-04-21 DIAGNOSIS — Z7982 Long term (current) use of aspirin: Secondary | ICD-10-CM

## 2023-04-21 DIAGNOSIS — N179 Acute kidney failure, unspecified: Secondary | ICD-10-CM | POA: Diagnosis present

## 2023-04-21 DIAGNOSIS — Y658 Other specified misadventures during surgical and medical care: Secondary | ICD-10-CM | POA: Diagnosis present

## 2023-04-21 DIAGNOSIS — D631 Anemia in chronic kidney disease: Secondary | ICD-10-CM | POA: Diagnosis present

## 2023-04-21 DIAGNOSIS — Z801 Family history of malignant neoplasm of trachea, bronchus and lung: Secondary | ICD-10-CM

## 2023-04-21 DIAGNOSIS — E785 Hyperlipidemia, unspecified: Secondary | ICD-10-CM | POA: Diagnosis present

## 2023-04-21 DIAGNOSIS — I739 Peripheral vascular disease, unspecified: Secondary | ICD-10-CM | POA: Diagnosis present

## 2023-04-21 DIAGNOSIS — N1831 Chronic kidney disease, stage 3a: Secondary | ICD-10-CM | POA: Diagnosis present

## 2023-04-21 DIAGNOSIS — T8383XA Hemorrhage of genitourinary prosthetic devices, implants and grafts, initial encounter: Secondary | ICD-10-CM | POA: Diagnosis not present

## 2023-04-21 DIAGNOSIS — Z8673 Personal history of transient ischemic attack (TIA), and cerebral infarction without residual deficits: Secondary | ICD-10-CM

## 2023-04-21 DIAGNOSIS — Z66 Do not resuscitate: Secondary | ICD-10-CM | POA: Diagnosis present

## 2023-04-21 DIAGNOSIS — Z96653 Presence of artificial knee joint, bilateral: Secondary | ICD-10-CM | POA: Diagnosis present

## 2023-04-21 DIAGNOSIS — I4510 Unspecified right bundle-branch block: Secondary | ICD-10-CM | POA: Diagnosis present

## 2023-04-21 DIAGNOSIS — N401 Enlarged prostate with lower urinary tract symptoms: Secondary | ICD-10-CM | POA: Diagnosis present

## 2023-04-21 DIAGNOSIS — E872 Acidosis, unspecified: Secondary | ICD-10-CM | POA: Diagnosis present

## 2023-04-21 DIAGNOSIS — N320 Bladder-neck obstruction: Secondary | ICD-10-CM | POA: Diagnosis present

## 2023-04-21 DIAGNOSIS — Z8249 Family history of ischemic heart disease and other diseases of the circulatory system: Secondary | ICD-10-CM

## 2023-04-21 DIAGNOSIS — E876 Hypokalemia: Secondary | ICD-10-CM | POA: Diagnosis present

## 2023-04-21 DIAGNOSIS — Z808 Family history of malignant neoplasm of other organs or systems: Secondary | ICD-10-CM

## 2023-04-21 DIAGNOSIS — I714 Abdominal aortic aneurysm, without rupture, unspecified: Secondary | ICD-10-CM | POA: Diagnosis present

## 2023-04-21 LAB — COMPREHENSIVE METABOLIC PANEL
ALT: 24 U/L (ref 0–44)
AST: 34 U/L (ref 15–41)
Albumin: 2.7 g/dL — ABNORMAL LOW (ref 3.5–5.0)
Alkaline Phosphatase: 86 U/L (ref 38–126)
Anion gap: 9 (ref 5–15)
BUN: 42 mg/dL — ABNORMAL HIGH (ref 8–23)
CO2: 21 mmol/L — ABNORMAL LOW (ref 22–32)
Calcium: 8.5 mg/dL — ABNORMAL LOW (ref 8.9–10.3)
Chloride: 112 mmol/L — ABNORMAL HIGH (ref 98–111)
Creatinine, Ser: 3.7 mg/dL — ABNORMAL HIGH (ref 0.61–1.24)
GFR, Estimated: 15 mL/min — ABNORMAL LOW (ref >60)
Glucose, Bld: 152 mg/dL — ABNORMAL HIGH (ref 70–99)
Potassium: 3.6 mmol/L (ref 3.5–5.1)
Sodium: 142 mmol/L (ref 135–145)
Total Bilirubin: 0.6 mg/dL (ref <1.2)
Total Protein: 6.9 g/dL (ref 6.5–8.1)

## 2023-04-21 LAB — CBC WITH DIFFERENTIAL/PLATELET
Abs Immature Granulocytes: 0.41 10*3/uL — ABNORMAL HIGH (ref 0.00–0.07)
Basophils Absolute: 0.1 10*3/uL (ref 0.0–0.1)
Basophils Relative: 0 %
Eosinophils Absolute: 0.1 10*3/uL (ref 0.0–0.5)
Eosinophils Relative: 0 %
HCT: 39.6 % (ref 39.0–52.0)
Hemoglobin: 12.6 g/dL — ABNORMAL LOW (ref 13.0–17.0)
Immature Granulocytes: 2 %
Lymphocytes Relative: 4 %
Lymphs Abs: 1 10*3/uL (ref 0.7–4.0)
MCH: 29.3 pg (ref 26.0–34.0)
MCHC: 31.8 g/dL (ref 30.0–36.0)
MCV: 92.1 fL (ref 80.0–100.0)
Monocytes Absolute: 0.7 10*3/uL (ref 0.1–1.0)
Monocytes Relative: 3 %
Neutro Abs: 21.2 10*3/uL — ABNORMAL HIGH (ref 1.7–7.7)
Neutrophils Relative %: 91 %
Platelets: 407 10*3/uL — ABNORMAL HIGH (ref 150–400)
RBC: 4.3 MIL/uL (ref 4.22–5.81)
RDW: 14.1 % (ref 11.5–15.5)
WBC: 23.4 10*3/uL — ABNORMAL HIGH (ref 4.0–10.5)
nRBC: 0 % (ref 0.0–0.2)

## 2023-04-21 NOTE — ED Triage Notes (Signed)
Pt to ED via [facility] from [facility]. Pt has urinary catheter and pt reports it is not flowing x2 days. Staffs reports of blood coming out of it after flushing it. Pt is on plavix from CVA. EMS reports showing afib on monitor with no hx.   EMS:  BP 130/78 95% RA

## 2023-04-21 NOTE — ED Triage Notes (Signed)
See first nurse note. 

## 2023-04-21 NOTE — ED Notes (Addendum)
Bladder scanned patient due to decreased output noticed in foley catheter with increased pain. Gross hematuria present in foley. Notified Paduchowski, MD. Bladder scan showed >771mL in bladder.

## 2023-04-22 ENCOUNTER — Inpatient Hospital Stay
Admission: EM | Admit: 2023-04-22 | Discharge: 2023-04-27 | DRG: 698 | Disposition: A | Discharge status: Skilled Nursing Facility | Payer: Medicare (Managed Care) | Source: Skilled Nursing Facility | Attending: Internal Medicine | Admitting: Internal Medicine

## 2023-04-22 DIAGNOSIS — E876 Hypokalemia: Secondary | ICD-10-CM | POA: Diagnosis present

## 2023-04-22 DIAGNOSIS — T83021A Displacement of indwelling urethral catheter, initial encounter: Secondary | ICD-10-CM | POA: Diagnosis present

## 2023-04-22 DIAGNOSIS — N189 Chronic kidney disease, unspecified: Secondary | ICD-10-CM | POA: Diagnosis not present

## 2023-04-22 DIAGNOSIS — F32A Depression, unspecified: Secondary | ICD-10-CM | POA: Insufficient documentation

## 2023-04-22 DIAGNOSIS — I714 Abdominal aortic aneurysm, without rupture, unspecified: Secondary | ICD-10-CM | POA: Diagnosis present

## 2023-04-22 DIAGNOSIS — N401 Enlarged prostate with lower urinary tract symptoms: Secondary | ICD-10-CM | POA: Insufficient documentation

## 2023-04-22 DIAGNOSIS — A4150 Gram-negative sepsis, unspecified: Secondary | ICD-10-CM | POA: Diagnosis present

## 2023-04-22 DIAGNOSIS — N400 Enlarged prostate without lower urinary tract symptoms: Secondary | ICD-10-CM | POA: Diagnosis not present

## 2023-04-22 DIAGNOSIS — R338 Other retention of urine: Secondary | ICD-10-CM | POA: Diagnosis present

## 2023-04-22 DIAGNOSIS — Y658 Other specified misadventures during surgical and medical care: Secondary | ICD-10-CM | POA: Diagnosis present

## 2023-04-22 DIAGNOSIS — R310 Gross hematuria: Secondary | ICD-10-CM | POA: Insufficient documentation

## 2023-04-22 DIAGNOSIS — Z434 Encounter for attention to other artificial openings of digestive tract: Secondary | ICD-10-CM

## 2023-04-22 DIAGNOSIS — I130 Hypertensive heart and chronic kidney disease with heart failure and stage 1 through stage 4 chronic kidney disease, or unspecified chronic kidney disease: Secondary | ICD-10-CM | POA: Diagnosis present

## 2023-04-22 DIAGNOSIS — E870 Hyperosmolality and hypernatremia: Secondary | ICD-10-CM | POA: Diagnosis present

## 2023-04-22 DIAGNOSIS — I4510 Unspecified right bundle-branch block: Secondary | ICD-10-CM | POA: Diagnosis present

## 2023-04-22 DIAGNOSIS — E785 Hyperlipidemia, unspecified: Secondary | ICD-10-CM | POA: Insufficient documentation

## 2023-04-22 DIAGNOSIS — N390 Urinary tract infection, site not specified: Secondary | ICD-10-CM | POA: Diagnosis not present

## 2023-04-22 DIAGNOSIS — Z8719 Personal history of other diseases of the digestive system: Secondary | ICD-10-CM

## 2023-04-22 DIAGNOSIS — R6511 Systemic inflammatory response syndrome (SIRS) of non-infectious origin with acute organ dysfunction: Secondary | ICD-10-CM | POA: Diagnosis present

## 2023-04-22 DIAGNOSIS — N1831 Chronic kidney disease, stage 3a: Secondary | ICD-10-CM | POA: Diagnosis present

## 2023-04-22 DIAGNOSIS — Z8249 Family history of ischemic heart disease and other diseases of the circulatory system: Secondary | ICD-10-CM | POA: Diagnosis not present

## 2023-04-22 DIAGNOSIS — Z96653 Presence of artificial knee joint, bilateral: Secondary | ICD-10-CM | POA: Diagnosis present

## 2023-04-22 DIAGNOSIS — T8383XA Hemorrhage of genitourinary prosthetic devices, implants and grafts, initial encounter: Secondary | ICD-10-CM | POA: Diagnosis present

## 2023-04-22 DIAGNOSIS — N179 Acute kidney failure, unspecified: Secondary | ICD-10-CM | POA: Diagnosis present

## 2023-04-22 DIAGNOSIS — D631 Anemia in chronic kidney disease: Secondary | ICD-10-CM | POA: Diagnosis present

## 2023-04-22 DIAGNOSIS — N320 Bladder-neck obstruction: Secondary | ICD-10-CM | POA: Diagnosis present

## 2023-04-22 DIAGNOSIS — E872 Acidosis, unspecified: Secondary | ICD-10-CM | POA: Diagnosis present

## 2023-04-22 DIAGNOSIS — N1330 Unspecified hydronephrosis: Secondary | ICD-10-CM | POA: Diagnosis present

## 2023-04-22 DIAGNOSIS — I739 Peripheral vascular disease, unspecified: Secondary | ICD-10-CM | POA: Diagnosis present

## 2023-04-22 DIAGNOSIS — K219 Gastro-esophageal reflux disease without esophagitis: Secondary | ICD-10-CM | POA: Insufficient documentation

## 2023-04-22 DIAGNOSIS — Z66 Do not resuscitate: Secondary | ICD-10-CM | POA: Diagnosis present

## 2023-04-22 LAB — URINALYSIS, ROUTINE W REFLEX MICROSCOPIC
RBC / HPF: >50 RBC/hpf (ref 0–5)
Squamous Epithelial / HPF: 0 /[HPF] (ref 0–5)
WBC, UA: >50 WBC/hpf (ref 0–5)

## 2023-04-22 LAB — HEMOGLOBIN AND HEMATOCRIT, BLOOD
HCT: 36.7 % — ABNORMAL LOW (ref 39.0–52.0)
Hemoglobin: 11.4 g/dL — ABNORMAL LOW (ref 13.0–17.0)

## 2023-04-22 LAB — PROCALCITONIN: Procalcitonin: 0.53 ng/mL

## 2023-04-22 LAB — PROTIME-INR
INR: 1.4 — ABNORMAL HIGH (ref 0.8–1.2)
Prothrombin Time: 17 s — ABNORMAL HIGH (ref 11.4–15.2)

## 2023-04-22 LAB — LACTIC ACID, PLASMA
Lactic Acid, Venous: 4.4 mmol/L (ref 0.5–1.9)
Lactic Acid, Venous: 1.5 mmol/L (ref 0.5–1.9)

## 2023-04-22 MED ORDER — SODIUM CHLORIDE 0.9 % IV SOLN: INTRAVENOUS | Status: DC

## 2023-04-22 MED ORDER — MAGNESIUM HYDROXIDE 400 MG/5ML PO SUSP: 30.0000 mL | Freq: Every day | ORAL | Status: DC | PRN

## 2023-04-22 MED ORDER — CLOPIDOGREL BISULFATE 75 MG PO TABS
75.0000 mg | ORAL_TABLET | Freq: Every day | ORAL | Status: DC
  Filled 2023-04-22: qty 1

## 2023-04-22 MED ORDER — SODIUM CHLORIDE 0.9 % IV SOLN
1.0000 g | Freq: Once | INTRAVENOUS | Status: AC
  Administered 2023-04-22: 1 g via INTRAVENOUS
  Filled 2023-04-22: qty 10

## 2023-04-22 MED ORDER — CEFTRIAXONE SODIUM 2 G IJ SOLR
2.0000 g | INTRAMUSCULAR | Status: DC
  Administered 2023-04-22 – 2023-04-23 (×2): 2 g via INTRAVENOUS
  Filled 2023-04-22 (×2): qty 20

## 2023-04-22 MED ORDER — POLYVINYL ALCOHOL 1.4 % OP SOLN: 2.0000 [drp] | OPHTHALMIC | Status: DC | PRN

## 2023-04-22 MED ORDER — ONDANSETRON HCL 4 MG PO TABS: 4.0000 mg | ORAL_TABLET | Freq: Four times a day (QID) | ORAL | Status: DC | PRN

## 2023-04-22 MED ORDER — BISACODYL 10 MG RE SUPP: 10.0000 mg | Freq: Every day | RECTAL | Status: DC | PRN

## 2023-04-22 MED ORDER — ACETAMINOPHEN 650 MG RE SUPP: 650.0000 mg | Freq: Four times a day (QID) | RECTAL | Status: DC | PRN

## 2023-04-22 MED ORDER — LACTATED RINGERS IV SOLN: INTRAVENOUS | Status: DC

## 2023-04-22 MED ORDER — ONDANSETRON HCL 4 MG/2ML IJ SOLN: 4.0000 mg | Freq: Four times a day (QID) | INTRAMUSCULAR | Status: DC | PRN

## 2023-04-22 MED ORDER — PANTOPRAZOLE SODIUM 40 MG PO TBEC
40.0000 mg | DELAYED_RELEASE_TABLET | Freq: Every day | ORAL | Status: DC
  Administered 2023-04-22 – 2023-04-27 (×5): 40 mg via ORAL
  Filled 2023-04-22 (×6): qty 1

## 2023-04-22 MED ORDER — TRAZODONE HCL 50 MG PO TABS
25.0000 mg | ORAL_TABLET | Freq: Every evening | ORAL | Status: DC | PRN
  Administered 2023-04-24 – 2023-04-25 (×2): 25 mg via ORAL
  Filled 2023-04-22 (×2): qty 1

## 2023-04-22 MED ORDER — ASPIRIN 81 MG PO CHEW
81.0000 mg | CHEWABLE_TABLET | Freq: Every day | ORAL | Status: DC
  Filled 2023-04-22: qty 1

## 2023-04-22 MED ORDER — SERTRALINE HCL 50 MG PO TABS
25.0000 mg | ORAL_TABLET | Freq: Every day | ORAL | Status: DC
  Administered 2023-04-22 – 2023-04-27 (×5): 25 mg via ORAL
  Filled 2023-04-22 (×6): qty 1

## 2023-04-22 MED ORDER — ATORVASTATIN CALCIUM 20 MG PO TABS
40.0000 mg | ORAL_TABLET | Freq: Every day | ORAL | Status: DC
  Administered 2023-04-22 – 2023-04-27 (×5): 40 mg via ORAL
  Filled 2023-04-22 (×6): qty 2

## 2023-04-22 MED ORDER — LACTATED RINGERS IV BOLUS: 1000.0000 mL | Freq: Once | INTRAVENOUS | Status: DC

## 2023-04-22 MED ORDER — SODIUM CHLORIDE 0.9 % IR SOLN: 3000.0000 mL | Status: DC

## 2023-04-22 MED ORDER — POLYETHYLENE GLYCOL 3350 17 G PO PACK
17.0000 g | PACK | Freq: Two times a day (BID) | ORAL | Status: DC
  Administered 2023-04-22 – 2023-04-27 (×11): 17 g via ORAL
  Filled 2023-04-22 (×10): qty 1

## 2023-04-22 MED ORDER — VITAMIN B-12 1000 MCG PO TABS
1000.0000 ug | ORAL_TABLET | Freq: Every day | ORAL | Status: DC
  Administered 2023-04-22 – 2023-04-27 (×5): 1000 ug via ORAL
  Filled 2023-04-22 (×4): qty 1
  Filled 2023-04-22: qty 2
  Filled 2023-04-22: qty 1

## 2023-04-22 MED ORDER — TAMSULOSIN HCL 0.4 MG PO CAPS
0.4000 mg | ORAL_CAPSULE | Freq: Every day | ORAL | Status: DC
  Administered 2023-04-22 – 2023-04-26 (×4): 0.4 mg via ORAL
  Filled 2023-04-22 (×5): qty 1

## 2023-04-22 MED ORDER — ACETAMINOPHEN 325 MG PO TABS
650.0000 mg | ORAL_TABLET | Freq: Four times a day (QID) | ORAL | Status: DC | PRN
  Administered 2023-04-25 – 2023-04-27 (×2): 650 mg via ORAL
  Filled 2023-04-22 (×2): qty 2

## 2023-04-22 NOTE — Assessment & Plan Note (Addendum)
-   The patient will be admitted to a telemetry bed. - Will continue antibiotic therapy with IV Rocephin. - He had a replacement of his Foley catheter with clearing hematuria. - He will have continuous bladder irrigation. - We will continue hydration with IV lactated ringer. - We will follow blood and urine cultures.

## 2023-04-22 NOTE — Progress Notes (Addendum)
Interim progress note not for billing.  Admitted earlier this morning by my colleague. I have seen and examined the patient, reviewed the chart, reviewed the h and p, and agree with the assessment and plan unless otherwise stipulated.  Recent hospitalization. Had cva in November treated with endarterectomy. then last month with acute cholecystitis, has biliary drain that is functioning. Also had hematuria and traumatic foley d/c that hospitalization, discharged with foley in place. He presents with hematuria. Found to have aki. CT shows foley bulb in the urethra. Per nursing it appears the nursing team has advanced the foley and it is now draining well and flushes well. There is gross hematuria but no clots. He has been started on iv fluids and also ceftriaxone for possible uti. Lactic acid is elevated and cultures are pending. Bladder scan shows decompressed bladder. Urology will be seeing patient today.  I updated family (son-in-law) telephonically.  Addendum 18:00 Bmp from this morning continues to show as collected but not run, will order stat bmp and have communicated with nursing to ensure it gets drawn and that any abnormalities night float will be notified.

## 2023-04-22 NOTE — ED Provider Notes (Signed)
[facility] Provider Note    Event Date/Time   First MD Initiated Contact with Patient 04/22/23 0031     (approximate)   History   Hematuria   HPI  Thomas [name] is a 89 y.o. male history of hypertension, hyperlipidemia, peripheral arterial disease on Plavix, CHF who presents to the emergency department from [facility] with complaints of abdominal pain and that his Foley catheter is not appropriately draining.  He started having blood in his catheter today.  He has had vomiting.  No fever.  No diarrhea.   History provided by patient, family.    Past Medical History:  Diagnosis Date   Arthritis    Carpal tunnel syndrome on both sides    GERD (gastroesophageal reflux disease)    Hyperlipidemia    Hypertension     Past Surgical History:  Procedure Laterality Date   ENDARTERECTOMY Right 03/19/2023   Procedure: ENDARTERECTOMY CAROTID;  Surgeon: Renford Dills, MD;  Location: [facility] ORS;  Service: Vascular;  Laterality: Right;   INGUINAL HERNIA REPAIR  2001   IR PERC CHOLECYSTOSTOMY  04/17/2023   REPLACEMENT TOTAL KNEE Bilateral     MEDICATIONS:  Prior to Admission medications   Medication Sig Start Date End Date Taking? Authorizing Provider  acetaminophen (TYLENOL) 325 MG tablet Take 2 tablets (650 mg total) by mouth every 6 (six) hours as needed for fever, headache or moderate pain. 05/31/20   Alford Highland, MD  aspirin 81 MG chewable tablet Chew 1 tablet (81 mg total) by mouth daily. 04/17/23   Gillis Santa, MD  atorvastatin (LIPITOR) 40 MG tablet Take 1 tablet (40 mg total) by mouth daily. 04/17/23   Gillis Santa, MD  bisacodyl (DULCOLAX) 10 MG suppository Place 1 suppository (10 mg total) rectally daily as needed for severe constipation. 02/19/23   Gillis Santa, MD  bisacodyl (DULCOLAX) 5 MG EC tablet Take 2 tablets (10 mg total) by mouth at bedtime. 02/19/23   Gillis Santa, MD  clopidogrel (PLAVIX) 75 MG tablet Take 1 tablet (75 mg  total) by mouth daily. Total 90 days from Sep 16 to Dec 14th as per Neurologist 02/20/23 04/24/23  Gillis Santa, MD  cyanocobalamin (VITAMIN B12) 1000 MCG tablet Take 1,000 mcg by mouth daily. 09/19/21   [provider]  pantoprazole (PROTONIX) 40 MG tablet Take 1 tablet (40 mg total) by mouth daily. 02/20/23   Gillis Santa, MD  polyethylene glycol (MIRALAX / GLYCOLAX) 17 g packet Take 17 g by mouth 2 (two) times daily. 04/19/23   Gillis Santa, MD  polyvinyl alcohol (LIQUIFILM TEARS) 1.4 % ophthalmic solution Place 2 drops into both eyes as needed for dry eyes. 04/19/23   Gillis Santa, MD  sertraline (ZOLOFT) 25 MG tablet Take 1 tablet by mouth daily. 03/05/23   [provider]  tamsulosin (FLOMAX) 0.4 MG CAPS capsule Take 1 capsule (0.4 mg total) by mouth daily after supper. 04/16/23   Gillis Santa, MD    Physical Exam   Triage Vital Signs: ED Triage Vitals  Encounter Vitals Group     BP 04/21/23 1745 113/74     Systolic BP Percentile --      Diastolic BP Percentile --      Pulse Rate 04/21/23 1745 99     Resp 04/21/23 1745 15     Temp 04/21/23 1745 97.6 F (36.4 C)     Temp Source 04/21/23 1745 Oral     SpO2 04/21/23 1745 98 %  Weight --      Height --      Head Circumference --      Peak Flow --      Pain Score 04/21/23 1754 0     Pain Loc --      Pain Education --      Exclude from Growth Chart --     Most recent vital signs: Vitals:   04/22/23 0215 04/22/23 0216  BP:    Pulse: 88   Resp: 18   Temp:  98 F (36.7 C)  SpO2: 96%     CONSTITUTIONAL: Alert, responds appropriately to questions.  Elderly, moaning in pain, appears uncomfortable HEAD: Normocephalic, atraumatic EYES: Conjunctivae clear, pupils appear equal, sclera nonicteric ENT: normal nose; moist mucous membranes NECK: Supple, normal ROM CARD: RRR; S1 and S2 appreciated RESP: Normal chest excursion without splinting or tachypnea; breath sounds clear and equal bilaterally; no  wheezes, no rhonchi, no rales, no hypoxia or respiratory distress, speaking full sentences ABD/GI: Abdomen distended, no guarding or rebound, tender to palpation; frank blood noted in the Foley catheter bag without clots BACK: The back appears normal EXT: Normal ROM in all joints; no deformity noted, no edema SKIN: Normal color for age and race; warm; no rash on exposed skin NEURO: Moves all extremities equally, normal speech PSYCH: The patient's mood and manner are appropriate.   ED Results / Procedures / Treatments   LABS: (all labs ordered are listed, but only abnormal results are displayed) Labs Reviewed  COMPREHENSIVE METABOLIC PANEL - Abnormal; Notable for the following components:      Result Value   Chloride 112 (*)    CO2 21 (*)    Glucose, Bld 152 (*)    BUN 42 (*)    Creatinine, Ser 3.70 (*)    Calcium 8.5 (*)    Albumin 2.7 (*)    GFR, Estimated 15 (*)    All other components within normal limits  CBC WITH DIFFERENTIAL/PLATELET - Abnormal; Notable for the following components:   WBC 23.4 (*)    Hemoglobin 12.6 (*)    Platelets 407 (*)    Neutro Abs 21.2 (*)    Abs Immature Granulocytes 0.41 (*)    All other components within normal limits  URINE CULTURE  CULTURE, BLOOD (ROUTINE X 2)  CULTURE, BLOOD (ROUTINE X 2)  PROCALCITONIN  PROTIME-INR  URINALYSIS, ROUTINE W REFLEX MICROSCOPIC  LACTIC ACID, PLASMA  LACTIC ACID, PLASMA     EKG:  EKG Interpretation Date/Time:  Wednesday April 21 2023 17:57:28 EST Ventricular Rate:  89 PR Interval:  158 QRS Duration:  106 QT Interval:  398 QTC Calculation: 484 R Axis:   266  Text Interpretation: Sinus rhythm with Premature supraventricular complexes Incomplete right bundle branch block Right ventricular hypertrophy Possible Lateral infarct , age undetermined Abnormal ECG When compared with ECG of 11-Apr-2023 06:10, Premature supraventricular complexes are now Present Vent. rate has increased BY  30 BPM Borderline  criteria for Lateral infarct are now Present Confirmed by Dekota Kirlin, Baxter Hire [phone]) on 04/22/2023 12:32:05 AM         RADIOLOGY: My personal review and interpretation of imaging: CT scan shows that the Foley catheter is in the penis and not within the bladder.  Bladder is significantly distended.  He has associated hydronephrosis.  I have personally reviewed all radiology reports.   CT Renal Stone Study Result Date: 04/21/2023 CLINICAL DATA:  Hematuria, mild functioning Foley catheter EXAM: CT ABDOMEN AND PELVIS WITHOUT CONTRAST TECHNIQUE: Multidetector  CT imaging of the abdomen and pelvis was performed following the standard protocol without IV contrast. RADIATION DOSE REDUCTION: This exam was performed according to the departmental dose-optimization program which includes automated exposure control, adjustment of the mA and/or kV according to patient size and/or use of iterative reconstruction technique. COMPARISON:  04/16/2023 FINDINGS: Lower chest: Lung bases demonstrate mild scarring stable from the prior study. Hepatobiliary: Liver is within normal limits. Cholecystostomy catheter is noted in place. Gallbladder is decompressed. Pancreas: Unremarkable. No pancreatic ductal dilatation or surrounding inflammatory changes. Spleen: Normal in size without focal abnormality. Adrenals/Urinary Tract: Adrenal glands are within normal limits. Bilateral hydronephrosis and hydroureter is noted without evidence of obstructing stones. Some dependent density is noted within the bladder consistent with the given clinical history of hematuria. Air is noted within the bladder. The bladder is significantly over distended. A Foley catheter is noted in place although lies within the midportion of the penile urethra. The tip extends to the level of the inferior prostatic urethra. The balloon should be deflated and the catheter advanced into the bladder to allow for optimum function. Stable simple cysts are noted within the  kidneys bilaterally. No follow-up is recommended. Stomach/Bowel: No obstructive or inflammatory changes of the colon are seen. The appendix is not well visualized although no inflammatory changes are seen. Small bowel and stomach are within normal limits with the exception of a moderate to large hiatal hernia. Vascular/Lymphatic: Atherosclerotic calcifications of the abdominal aorta are noted. There is dilatation of the infrarenal aorta to 4.2 cm in greatest dimension. This is unchanged from the prior exam. No lymphadenopathy is noted. Reproductive: Prostate is prominent indenting upon the inferior aspect of the bladder. Other: No abdominal wall hernia or abnormality. No abdominopelvic ascites. Musculoskeletal: No acute bony abnormality is noted. Chronic changes at the T12-L1 interspace are noted. IMPRESSION: The previously seen Foley catheter has been withdrawn and now lies in the midportion of the penile urethra. This should be deflated and advanced into the bladder to allow for optimum function. Abdominal aortic aneurysm measuring 4.2 cm. Recommend CTA or MRA, as appropriate, in 12 months and referral to vascular specialist as clinically necessary given the patient's advanced age. Reference: Journal of Vascular Surgery 67.1 (2018): 2-77. J Am Coll Radiol [phone]. Cholecystostomy tube is noted in satisfactory position. Bilateral hydronephrosis related to over distention of the urinary bladder. Electronically Signed   By: Alcide Clever M.D.   On: 04/21/2023 23:51     PROCEDURES:  Critical Care performed: No     Procedures    IMPRESSION / MDM / ASSESSMENT AND PLAN / ED COURSE  I reviewed the triage vital signs and the nursing notes.    Patient here with Foley catheter complication, hematuria.    DIFFERENTIAL DIAGNOSIS (includes but not limited to):   Foley catheter obstruction, dislodged catheter, UTI, bladder malignancy, bleeding from antiplatelet use, anemia, kidney  stone   Patient's presentation is most consistent with acute presentation with potential threat to life or bodily function.   PLAN: Labs obtained from triage show leukocytosis of 23,000.  He is afebrile and hemodynamically stable.  This could be reactive or related to infection.  Will check procalcitonin, lactic, blood cultures.  Will give Rocephin for possible UTI.  He also has a new AKI which is likely postobstructive in nature.  Noncon CT scan reviewed and interpreted by myself and the radiologist shows that the Foley catheter is within the penile urethra and not in the bladder.  Will adjust and flush  Foley catheter.  He has resulting hydronephrosis secondary to post obstruction.     MEDICATIONS GIVEN IN ED: Medications  lactated ringers infusion ( Intravenous New Bag/Given 04/22/23 0148)  cefTRIAXone (ROCEPHIN) 1 g in sodium chloride 0.9 % 100 mL IVPB (0 g Intravenous Stopped 04/22/23 0253)     ED COURSE: Patient has an 57 French Foley catheter in place.  We were able to adjust it, flushed it and he put out approximately 3 L.  Urine is clearing.  He feels much better.  Given leukocytosis, elevated procalcitonin, I am concerned for sepsis secondary to UTI.  He is getting IV fluids and Rocephin here.  Lactic pending.  Cultures pending.  Will discuss with hospitalist for admission.  Patient is hemodynamically stable.   CONSULTS:  Consulted and discussed patient's case with hospitalist, Dr. Arville Care.  I have recommended admission and consulting physician agrees and will place admission orders.  Patient (and family if present) agree with this plan.   I reviewed all nursing notes, vitals, pertinent previous records.  All labs, EKGs, imaging ordered have been independently reviewed and interpreted by myself.    OUTSIDE RECORDS REVIEWED:   Reviewed patient's recent hospitalization in November.       FINAL CLINICAL IMPRESSION(S) / ED DIAGNOSES   Final diagnoses:  AKI (acute kidney injury)  (HCC)  Gross hematuria  Acute UTI     Rx / DC Orders   ED Discharge Orders     None        Note:  This document was prepared using Dragon voice recognition software and may include unintentional dictation errors.   Baudelia Schroepfer, Layla Maw, DO 04/22/23 [phone]

## 2023-04-22 NOTE — H&P (Signed)
Page   PATIENT NAME: Thomas [name]    MR#:  [id number]  DATE OF BIRTH:  Jan 01, 1931  DATE OF ADMISSION:  04/22/2023  PRIMARY CARE PHYSICIAN: Barbette Reichmann, MD   Patient is coming from: SNF.  REQUESTING/REFERRING PHYSICIAN: Ward, Layla Maw, DO  CHIEF COMPLAINT:   Chief Complaint  Patient presents with   Hematuria    HISTORY OF PRESENT ILLNESS:  Thomas [name] is a 89 y.o. Caucasian male with medical history significant for osteoarthritis, hypertension, dyslipidemia and GERD, who presented to the emergency room with acute onset of hematuria.  The patient apparently pulled his Foley catheter at his SNF and it has been not been draining.  He has been having nausea and vomiting as well as diminished appetite over the last week.  He was recently discharged from here after being managed for acute CVA and acute cholecystitis status post cholecystostomy tube.  He denied any fever or chills.  No chest pain or palpitations.  No cough or wheezing or dyspnea.  When he came to the ER, vital signs were within normal.  Labs revealed a BUN of 42 and creatinine 3.7 compared to 32 and 1.22 on 04/19/2023 and blood glucose of 152 with a CO2 of 21.  Albumin was 2.7 with total protein 6.9.  Lactic acid was 4.4 and procalcitonin 0.53.  CBC showed significant cytosis of 23.4 with neutrophilia compared to 7.3 on 12/9.  INR is 1.4 and PT 17.  UA showed UTI and hematuria as well as pyuria. EKG as reviewed by me : EKG showed normal sinus rhythm with a rate of 89 with premature supraventricular complexes, incomplete right bundle branch block and RVH. Imaging: CT renal stone revealed the following: The previously seen Foley catheter has been withdrawn and now lies in the midportion of the penile urethra. This should be deflated and advanced into the bladder to allow for optimum function.   Abdominal aortic aneurysm measuring 4.2 cm. Recommend CTA or MRA, as appropriate, in 12 months and referral to  vascular specialist as clinically necessary given the patient's advanced age.   Reference: Journal of Vascular Surgery 67.1 (2018): 2-77. J Am Coll Radiol [phone].   Cholecystostomy tube is noted in satisfactory position.   Bilateral hydronephrosis related to over distention of the urinary bladder.  The patient was given IV Rocephin and hydration with IV normal saline.  He will be admitted to telemetry bed for further evaluation and management. PAST MEDICAL HISTORY:   Past Medical History:  Diagnosis Date   Arthritis    Carpal tunnel syndrome on both sides    GERD (gastroesophageal reflux disease)    Hyperlipidemia    Hypertension     PAST SURGICAL HISTORY:   Past Surgical History:  Procedure Laterality Date   ENDARTERECTOMY Right 03/19/2023   Procedure: ENDARTERECTOMY CAROTID;  Surgeon: Renford Dills, MD;  Location: [facility] ORS;  Service: Vascular;  Laterality: Right;   INGUINAL HERNIA REPAIR  2001   IR PERC CHOLECYSTOSTOMY  04/17/2023   REPLACEMENT TOTAL KNEE Bilateral     SOCIAL HISTORY:   Social History   Tobacco Use   Smoking status: Never   Smokeless tobacco: Never  Substance Use Topics   Alcohol use: Never    FAMILY HISTORY:   Family History  Problem Relation Age of Onset   Cancer Sister        Liver cancer   Melanoma Sister    Cancer Brother  Pancreatic   Cancer Brother        Lung cancer   Heart disease Brother     DRUG ALLERGIES:  No Known Allergies  REVIEW OF SYSTEMS:   ROS As per history of present illness. All pertinent systems were reviewed above. Constitutional, HEENT, cardiovascular, respiratory, GI, GU, musculoskeletal, neuro, psychiatric, endocrine, integumentary and hematologic systems were reviewed and are otherwise negative/unremarkable except for positive findings mentioned above in the HPI.   MEDICATIONS AT HOME:   Prior to Admission medications   Medication Sig Start Date End Date Taking? Authorizing Provider   acetaminophen (TYLENOL) 325 MG tablet Take 2 tablets (650 mg total) by mouth every 6 (six) hours as needed for fever, headache or moderate pain. 05/31/20   Alford Highland, MD  aspirin 81 MG chewable tablet Chew 1 tablet (81 mg total) by mouth daily. 04/17/23   Gillis Santa, MD  atorvastatin (LIPITOR) 40 MG tablet Take 1 tablet (40 mg total) by mouth daily. 04/17/23   Gillis Santa, MD  bisacodyl (DULCOLAX) 10 MG suppository Place 1 suppository (10 mg total) rectally daily as needed for severe constipation. 02/19/23   Gillis Santa, MD  bisacodyl (DULCOLAX) 5 MG EC tablet Take 2 tablets (10 mg total) by mouth at bedtime. 02/19/23   Gillis Santa, MD  clopidogrel (PLAVIX) 75 MG tablet Take 1 tablet (75 mg total) by mouth daily. Total 90 days from Sep 16 to Dec 14th as per Neurologist 02/20/23 04/24/23  Gillis Santa, MD  cyanocobalamin (VITAMIN B12) 1000 MCG tablet Take 1,000 mcg by mouth daily. 09/19/21   [provider]  pantoprazole (PROTONIX) 40 MG tablet Take 1 tablet (40 mg total) by mouth daily. 02/20/23   Gillis Santa, MD  polyethylene glycol (MIRALAX / GLYCOLAX) 17 g packet Take 17 g by mouth 2 (two) times daily. 04/19/23   Gillis Santa, MD  polyvinyl alcohol (LIQUIFILM TEARS) 1.4 % ophthalmic solution Place 2 drops into both eyes as needed for dry eyes. 04/19/23   Gillis Santa, MD  sertraline (ZOLOFT) 25 MG tablet Take 1 tablet by mouth daily. 03/05/23   [provider]  tamsulosin (FLOMAX) 0.4 MG CAPS capsule Take 1 capsule (0.4 mg total) by mouth daily after supper. 04/16/23   Gillis Santa, MD      VITAL SIGNS:  Blood pressure (!) 149/78, pulse 70, temperature 98.1 F (36.7 C), temperature source Oral, resp. rate 15, SpO2 99%.  PHYSICAL EXAMINATION:  Physical Exam  GENERAL:  89 y.o.-year-old Caucasian male patient lying in the bed with no acute distress.  EYES: Pupils equal, round, reactive to light and accommodation. No scleral icterus. Extraocular muscles  intact.  HEENT: Head atraumatic, normocephalic. Oropharynx and nasopharynx clear.  NECK:  Supple, no jugular venous distention. No thyroid enlargement, no tenderness.  LUNGS: Normal breath sounds bilaterally, no wheezing, rales,rhonchi or crepitation. No use of accessory muscles of respiration.  CARDIOVASCULAR: Regular rate and rhythm, S1, S2 normal. No murmurs, rubs, or gallops.  ABDOMEN: Soft, nondistended, nontender. Bowel sounds present. No organomegaly or mass.  EXTREMITIES: No pedal edema, cyanosis, or clubbing.  NEUROLOGIC: Cranial nerves II through XII are intact. Muscle strength 5/5 in all extremities. Sensation intact. Gait not checked.  PSYCHIATRIC: The patient is alert and oriented x 3.  Normal affect and good eye contact. SKIN: No obvious rash, lesion, or ulcer.   LABORATORY PANEL:   CBC Recent Labs  Lab 04/21/23 1805  WBC 23.4*  HGB 12.6*  HCT 39.6  PLT 407*   ------------------------------------------------------------------------------------------------------------------  Chemistries  Recent Labs  Lab 04/17/23 0347 04/18/23 0325 04/21/23 1805  NA 143   < > 142  K 3.9   < > 3.6  CL 108   < > 112*  CO2 25   < > 21*  GLUCOSE 119*   < > 152*  BUN 25*   < > 42*  CREATININE 1.20   < > 3.70*  CALCIUM 8.8*   < > 8.5*  MG 2.5*  --   --   AST  --   --  34  ALT  --   --  24  ALKPHOS  --   --  86  BILITOT  --   --  0.6   < > = values in this interval not displayed.   ------------------------------------------------------------------------------------------------------------------  Cardiac Enzymes No results for input(s): "TROPONINI" in the last 168 hours. ------------------------------------------------------------------------------------------------------------------  RADIOLOGY:  CT Renal Stone Study Result Date: 04/21/2023 CLINICAL DATA:  Hematuria, mild functioning Foley catheter EXAM: CT ABDOMEN AND PELVIS WITHOUT CONTRAST TECHNIQUE: Multidetector CT  imaging of the abdomen and pelvis was performed following the standard protocol without IV contrast. RADIATION DOSE REDUCTION: This exam was performed according to the departmental dose-optimization program which includes automated exposure control, adjustment of the mA and/or kV according to patient size and/or use of iterative reconstruction technique. COMPARISON:  04/16/2023 FINDINGS: Lower chest: Lung bases demonstrate mild scarring stable from the prior study. Hepatobiliary: Liver is within normal limits. Cholecystostomy catheter is noted in place. Gallbladder is decompressed. Pancreas: Unremarkable. No pancreatic ductal dilatation or surrounding inflammatory changes. Spleen: Normal in size without focal abnormality. Adrenals/Urinary Tract: Adrenal glands are within normal limits. Bilateral hydronephrosis and hydroureter is noted without evidence of obstructing stones. Some dependent density is noted within the bladder consistent with the given clinical history of hematuria. Air is noted within the bladder. The bladder is significantly over distended. A Foley catheter is noted in place although lies within the midportion of the penile urethra. The tip extends to the level of the inferior prostatic urethra. The balloon should be deflated and the catheter advanced into the bladder to allow for optimum function. Stable simple cysts are noted within the kidneys bilaterally. No follow-up is recommended. Stomach/Bowel: No obstructive or inflammatory changes of the colon are seen. The appendix is not well visualized although no inflammatory changes are seen. Small bowel and stomach are within normal limits with the exception of a moderate to large hiatal hernia. Vascular/Lymphatic: Atherosclerotic calcifications of the abdominal aorta are noted. There is dilatation of the infrarenal aorta to 4.2 cm in greatest dimension. This is unchanged from the prior exam. No lymphadenopathy is noted. Reproductive: Prostate is  prominent indenting upon the inferior aspect of the bladder. Other: No abdominal wall hernia or abnormality. No abdominopelvic ascites. Musculoskeletal: No acute bony abnormality is noted. Chronic changes at the T12-L1 interspace are noted. IMPRESSION: The previously seen Foley catheter has been withdrawn and now lies in the midportion of the penile urethra. This should be deflated and advanced into the bladder to allow for optimum function. Abdominal aortic aneurysm measuring 4.2 cm. Recommend CTA or MRA, as appropriate, in 12 months and referral to vascular specialist as clinically necessary given the patient's advanced age. Reference: Journal of Vascular Surgery 67.1 (2018): 2-77. J Am Coll Radiol [phone]. Cholecystostomy tube is noted in satisfactory position. Bilateral hydronephrosis related to over distention of the urinary bladder. Electronically Signed   By: Alcide Clever M.D.   On: 04/21/2023 23:51  IMPRESSION AND PLAN:  Assessment and Plan: * Sepsis due to gram-negative UTI [facility]) - The patient will be admitted to a telemetry bed. - Will continue antibiotic therapy with IV Rocephin. - He had a replacement of his Foley catheter with clearing hematuria. - He will have continuous bladder irrigation. - We will continue hydration with IV lactated ringer. - We will follow blood and urine cultures.  Acute kidney injury superimposed on chronic kidney disease (HCC) - This was likely obstructive due to significant hematuria. - This should improve with current hydration and Foley replacement.  BPH (benign prostatic hyperplasia) - We will continue Flomax.  Depression - We will continue Zoloft.  GERD without esophagitis - We will continue PPI therapy.  Dyslipidemia - We will continue statin therapy.   DVT prophylaxis:Scds Advanced Care Planning:  Code Status: The patient is DNR and DNI. Family Communication:  The plan of care was discussed in details with the patient (and  family). I answered all questions. The patient agreed to proceed with the above mentioned plan. Further management will depend upon hospital course. Disposition Plan: Back to previous home environment Consults called: none.  All the records are reviewed and case discussed with ED provider.  Status is: Inpatient  At the time of the admission, it appears that the appropriate admission status for this patient is inpatient.  This is judged to be reasonable and necessary in order to provide the required intensity of service to ensure the patient's safety given the presenting symptoms, physical exam findings and initial radiographic and laboratory data in the context of comorbid conditions.  The patient requires inpatient status due to high intensity of service, high risk of further deterioration and high frequency of surveillance required.  I certify that at the time of admission, it is my clinical judgment that the patient will require inpatient hospital care extending more than 2 midnights.                            Dispo: The patient is from: Home              Anticipated d/c is to: Home              Patient currently is not medically stable to d/c.              Difficult to place patient: No  Hannah Beat M.D on 04/22/2023 at 10:13 AM  [facility]   From 7 PM-7 AM, contact night-coverage [email or web address]  CC: Primary care physician; Barbette Reichmann, MD

## 2023-04-22 NOTE — Assessment & Plan Note (Signed)
-   We will continue PPI therapy 

## 2023-04-22 NOTE — ED Notes (Signed)
Patient assisted to eat breakfast by EDT [facility].

## 2023-04-22 NOTE — Consult Note (Signed)
Urology Consult  I have been asked to see the patient by Dr. Ashok Pall, for evaluation and management of gross hematuria, Foley trauma.  Chief Complaint: Nondraining Foley, gross hematuria  History of Present Illness: Thomas [name] is a 89 y.o. year old male with BPH and a recent history of acute cholecystitis and urinary retention in the setting of CVA requiring Foley catheter placement by Dr. Annabell Howells on 03/22/2023 who was readmitted from [facility] comm[facility] overnight with reports of nondraining Foley catheter x 2 days followed by gross hematuria.  Admission labs were notable for AKI with creatinine 3.70 (baseline 1.2); CT stone study showed Foley balloon inflated within the penile urethra with a distended bladder and bilateral hydronephrosis.  Foley catheter was advanced by nursing and he has continued to have gross hematuria.  He is rather confused today and unable to contribute to HPI.  Past Medical History:  Diagnosis Date   Arthritis    Carpal tunnel syndrome on both sides    GERD (gastroesophageal reflux disease)    Hyperlipidemia    Hypertension     Past Surgical History:  Procedure Laterality Date   ENDARTERECTOMY Right 03/19/2023   Procedure: ENDARTERECTOMY CAROTID;  Surgeon: Renford Dills, MD;  Location: [facility] ORS;  Service: Vascular;  Laterality: Right;   INGUINAL HERNIA REPAIR  2001   IR PERC CHOLECYSTOSTOMY  04/17/2023   REPLACEMENT TOTAL KNEE Bilateral     Home Medications:  No outpatient medications have been marked as taking for the 04/22/23 encounter [facility] Encounter).    Allergies: No Known Allergies  Family History  Problem Relation Age of Onset   Cancer Sister        Liver cancer   Melanoma Sister    Cancer Brother        Pancreatic   Cancer Brother        Lung cancer   Heart disease Brother     Social History:  reports that he has never smoked. He has never used smokeless tobacco. He reports that he does not drink alcohol and does not use  drugs.  ROS: A complete review of systems was performed.  All systems are negative except for pertinent findings as noted.  Physical Exam:  Vital signs in last 24 hours: Temp:  [97.5 F (36.4 C)-98.1 F (36.7 C)] 97.5 F (36.4 C) (12/12 1035) Pulse Rate:  [40-99] 75 (12/12 1035) Resp:  [14-19] 15 (12/12 1035) BP: (113-149)/(67-83) 113/83 (12/12 1035) SpO2:  [90 %-100 %] 99 % (12/12 1035) Constitutional:  Alert and oriented, no acute distress HEENT:  AT, moist mucus membranes Cardiovascular: No clubbing, cyanosis, or edema Respiratory: Normal respiratory effort GU: Gross hematuria, small-volume oozing bleeding at the urethral meatus Skin: No rashes, bruises or suspicious lesions Neurologic: Grossly intact, no focal deficits, moving all 4 extremities Psychiatric: Normal mood and affect  Laboratory Data:  Recent Labs    04/21/23 1805 04/22/23 1100  WBC 23.4*  --   HGB 12.6* 11.4*  HCT 39.6 36.7*   Recent Labs    04/21/23 1805  NA 142  K 3.6  CL 112*  CO2 21*  GLUCOSE 152*  BUN 42*  CREATININE 3.70*  CALCIUM 8.5*   Recent Labs    04/22/23 0425  INR 1.4*   Urinalysis    Component Value Date/Time   COLORURINE RED (A) 04/22/2023 0240   APPEARANCEUR TURBID (A) 04/22/2023 0240   APPEARANCEUR Clear 10/19/2011 0819   LABSPEC  04/22/2023 0240    TEST NOT REPORTED DUE  TO COLOR INTERFERENCE OF URINE PIGMENT   LABSPEC 1.016 10/19/2011 0819   PHURINE  04/22/2023 0240    TEST NOT REPORTED DUE TO COLOR INTERFERENCE OF URINE PIGMENT   GLUCOSEU (A) 04/22/2023 0240    TEST NOT REPORTED DUE TO COLOR INTERFERENCE OF URINE PIGMENT   GLUCOSEU Negative 10/19/2011 0819   HGBUR (A) 04/22/2023 0240    TEST NOT REPORTED DUE TO COLOR INTERFERENCE OF URINE PIGMENT   BILIRUBINUR (A) 04/22/2023 0240    TEST NOT REPORTED DUE TO COLOR INTERFERENCE OF URINE PIGMENT   BILIRUBINUR Negative 10/19/2011 0819   KETONESUR (A) 04/22/2023 0240    TEST NOT REPORTED DUE TO COLOR INTERFERENCE  OF URINE PIGMENT   PROTEINUR (A) 04/22/2023 0240    TEST NOT REPORTED DUE TO COLOR INTERFERENCE OF URINE PIGMENT   NITRITE (A) 04/22/2023 0240    TEST NOT REPORTED DUE TO COLOR INTERFERENCE OF URINE PIGMENT   LEUKOCYTESUR (A) 04/22/2023 0240    TEST NOT REPORTED DUE TO COLOR INTERFERENCE OF URINE PIGMENT   LEUKOCYTESUR Negative 10/19/2011 0819   Results for orders placed or performed during the hospital encounter of 04/22/23  Blood culture (routine x 2)     Status: None (Preliminary result)   Collection Time: 04/22/23  1:26 AM   Specimen: BLOOD  Result Value Ref Range Status   Specimen Description BLOOD LEFT FOREARM  Final   Special Requests   Final    BOTTLES DRAWN AEROBIC AND ANAEROBIC Blood Culture results may not be optimal due to an inadequate volume of blood received in culture bottles   Culture   Final    NO GROWTH < 12 HOURS Performed at [facility], [address] [place], [place] [address]    Report Status PENDING  Incomplete    Radiologic Imaging: CT Renal Stone Study Result Date: 04/21/2023 CLINICAL DATA:  Hematuria, mild functioning Foley catheter EXAM: CT ABDOMEN AND PELVIS WITHOUT CONTRAST TECHNIQUE: Multidetector CT imaging of the abdomen and pelvis was performed following the standard protocol without IV contrast. RADIATION DOSE REDUCTION: This exam was performed according to the departmental dose-optimization program which includes automated exposure control, adjustment of the mA and/or kV according to patient size and/or use of iterative reconstruction technique. COMPARISON:  04/16/2023 FINDINGS: Lower chest: Lung bases demonstrate mild scarring stable from the prior study. Hepatobiliary: Liver is within normal limits. Cholecystostomy catheter is noted in place. Gallbladder is decompressed. Pancreas: Unremarkable. No pancreatic ductal dilatation or surrounding inflammatory changes. Spleen: Normal in size without focal abnormality. Adrenals/Urinary Tract:  Adrenal glands are within normal limits. Bilateral hydronephrosis and hydroureter is noted without evidence of obstructing stones. Some dependent density is noted within the bladder consistent with the given clinical history of hematuria. Air is noted within the bladder. The bladder is significantly over distended. A Foley catheter is noted in place although lies within the midportion of the penile urethra. The tip extends to the level of the inferior prostatic urethra. The balloon should be deflated and the catheter advanced into the bladder to allow for optimum function. Stable simple cysts are noted within the kidneys bilaterally. No follow-up is recommended. Stomach/Bowel: No obstructive or inflammatory changes of the colon are seen. The appendix is not well visualized although no inflammatory changes are seen. Small bowel and stomach are within normal limits with the exception of a moderate to large hiatal hernia. Vascular/Lymphatic: Atherosclerotic calcifications of the abdominal aorta are noted. There is dilatation of the infrarenal aorta to 4.2 cm in greatest dimension.  This is unchanged from the prior exam. No lymphadenopathy is noted. Reproductive: Prostate is prominent indenting upon the inferior aspect of the bladder. Other: No abdominal wall hernia or abnormality. No abdominopelvic ascites. Musculoskeletal: No acute bony abnormality is noted. Chronic changes at the T12-L1 interspace are noted. IMPRESSION: The previously seen Foley catheter has been withdrawn and now lies in the midportion of the penile urethra. This should be deflated and advanced into the bladder to allow for optimum function. Abdominal aortic aneurysm measuring 4.2 cm. Recommend CTA or MRA, as appropriate, in 12 months and referral to vascular specialist as clinically necessary given the patient's advanced age. Reference: Journal of Vascular Surgery 67.1 (2018): 2-77. J Am Coll Radiol [phone]. Cholecystostomy tube is noted in  satisfactory position. Bilateral hydronephrosis related to over distention of the urinary bladder. Electronically Signed   By: Alcide Clever M.D.   On: 04/21/2023 23:51   Bladder Irrigation  Due to gross hematuria patient is present today for a bladder irrigation. Patient was cleaned and prepped in a sterile fashion. 180 mL of sterile water was instilled into the bladder with a 70mL Toomey syringe through the catheter in place.  of urine return was cleared from the bladder with evacuation of 3ccs of clot material. Efflux remained red with the procedure and the catheter irrigated with some difficulty. At this point we elected to proceed with catheter upsizing. Patient tolerated well.   Performed by: Carman Ching, PA-C and Michiel Cowboy, PA-C  Cath Change/ Replacement  Patient is present today for a catheter change due to gross hematuria.  10ml of water was removed from the balloon, a 18FR coude foley cath was removed without difficulty.  Patient was cleaned and prepped in a sterile fashion with betadine and 2% lidocaine jelly was instilled into the urethra. A 22 FR coude foley cath was replaced into the bladder, no complications were noted. Urine return was noted 60ml and urine was red in color. The balloon was filled with 20ml of sterile water. A night bag was attached for drainage.  Patient tolerated well.    Performed by: Carman Ching, PA-C and Michiel Cowboy, PA-C  Bladder Irrigation  Due to gross hematuria patient is present today for a bladder irrigation. Patient was cleaned and prepped in a sterile fashion. 120 mL of sterile water was instilled into the bladder with a 70mL Toomey syringe through the catheter in place.  of urine return was cleared from the bladder with evacuation of 5ccs of clot material. Efflux cleared from red to pink with the procedure and the catheter irrigated easily. Upon completion, the catheter was draining well and was reattached to the  night bag for drainage. Patient tolerated well.   Performed by: Carman Ching, PA-C and Michiel Cowboy, PA-C  Assessment & Plan:  89 year old male with AKI 20 displaced Foley catheter and gross hematuria due to Foley trauma.  I attempted to manually irrigate his catheter at the bedside today, however had some difficulty with this concerning for possible retained clot.  We elected to upsize his catheter, which he tolerated well.  We subsequently irrigated his new catheter, which was significantly easier.  We were able to clear a very small volume of clot with this but his Foley was draining well upon completion, see above.  Continue Foley catheter and manually irrigate as needed to clear any obstructing clots.  Insufficient bleeding today to warrant CBI.  Would recommend antibiotics to treat possible UTI in the setting of Foley trauma.  Would also recommend holding anticoagulation temporarily to allow the urine to clear.  He will need an outpatient voiding trial in our clinic.    Thank you for involving me in this patient's care, please page with any further questions or concerns.  Carman Ching, PA-C 04/22/2023 1:15 PM

## 2023-04-22 NOTE — Assessment & Plan Note (Signed)
-   This was likely obstructive due to significant hematuria. - This should improve with current hydration and Foley replacement.

## 2023-04-22 NOTE — Assessment & Plan Note (Signed)
-   We will continue statin therapy. 

## 2023-04-22 NOTE — Assessment & Plan Note (Signed)
-   We will continue Flomax 

## 2023-04-22 NOTE — Assessment & Plan Note (Signed)
-   We will continue Zoloft 

## 2023-04-23 ENCOUNTER — Other Ambulatory Visit: Payer: Self-pay

## 2023-04-23 DIAGNOSIS — N401 Enlarged prostate with lower urinary tract symptoms: Secondary | ICD-10-CM | POA: Insufficient documentation

## 2023-04-23 DIAGNOSIS — R310 Gross hematuria: Secondary | ICD-10-CM | POA: Insufficient documentation

## 2023-04-23 DIAGNOSIS — N179 Acute kidney failure, unspecified: Secondary | ICD-10-CM | POA: Diagnosis not present

## 2023-04-23 DIAGNOSIS — N189 Chronic kidney disease, unspecified: Secondary | ICD-10-CM | POA: Diagnosis not present

## 2023-04-23 DIAGNOSIS — R338 Other retention of urine: Secondary | ICD-10-CM

## 2023-04-23 LAB — BASIC METABOLIC PANEL
Anion gap: 13 (ref 5–15)
Anion gap: 6 (ref 5–15)
BUN: 38 mg/dL — ABNORMAL HIGH (ref 8–23)
BUN: 33 mg/dL — ABNORMAL HIGH (ref 8–23)
CO2: 14 mmol/L — ABNORMAL LOW (ref 22–32)
CO2: 21 mmol/L — ABNORMAL LOW (ref 22–32)
Calcium: 7.6 mg/dL — ABNORMAL LOW (ref 8.9–10.3)
Calcium: 8 mg/dL — ABNORMAL LOW (ref 8.9–10.3)
Chloride: 110 mmol/L (ref 98–111)
Chloride: 120 mmol/L — ABNORMAL HIGH (ref 98–111)
Creatinine, Ser: 2.91 mg/dL — ABNORMAL HIGH (ref 0.61–1.24)
Creatinine, Ser: 1.85 mg/dL — ABNORMAL HIGH (ref 0.61–1.24)
GFR, Estimated: 20 mL/min — ABNORMAL LOW (ref >60)
GFR, Estimated: 34 mL/min — ABNORMAL LOW (ref >60)
Glucose, Bld: 104 mg/dL — ABNORMAL HIGH (ref 70–99)
Glucose, Bld: 83 mg/dL (ref 70–99)
Potassium: 3.9 mmol/L (ref 3.5–5.1)
Potassium: 3.6 mmol/L (ref 3.5–5.1)
Sodium: 137 mmol/L (ref 135–145)
Sodium: 147 mmol/L — ABNORMAL HIGH (ref 135–145)

## 2023-04-23 LAB — CBC
HCT: 33.2 % — ABNORMAL LOW (ref 39.0–52.0)
Hemoglobin: 9.7 g/dL — ABNORMAL LOW (ref 13.0–17.0)
MCH: 30.1 pg (ref 26.0–34.0)
MCHC: 29.2 g/dL — ABNORMAL LOW (ref 30.0–36.0)
MCV: 103.1 fL — ABNORMAL HIGH (ref 80.0–100.0)
Platelets: 317 10*3/uL (ref 150–400)
RBC: 3.22 MIL/uL — ABNORMAL LOW (ref 4.22–5.81)
RDW: 14.6 % (ref 11.5–15.5)
WBC: 19.5 10*3/uL — ABNORMAL HIGH (ref 4.0–10.5)
nRBC: 0 % (ref 0.0–0.2)

## 2023-04-23 LAB — URINE CULTURE: Culture: NO GROWTH

## 2023-04-23 LAB — BLOOD GAS, VENOUS
Acid-base deficit: 2.5 mmol/L — ABNORMAL HIGH (ref 0.0–2.0)
Bicarbonate: 21.7 mmol/L (ref 20.0–28.0)
O2 Saturation: 89.8 %
Patient temperature: 37
pCO2, Ven: 35 mm[Hg] — ABNORMAL LOW (ref 44–60)
pH, Ven: 7.4 (ref 7.25–7.43)
pO2, Ven: 57 mm[Hg] — ABNORMAL HIGH (ref 32–45)

## 2023-04-23 LAB — ALBUMIN: Albumin: 1.9 g/dL — ABNORMAL LOW (ref 3.5–5.0)

## 2023-04-23 MED ORDER — SODIUM CHLORIDE 0.45 % IV SOLN: INTRAVENOUS | Status: DC

## 2023-04-23 MED ORDER — CHLORHEXIDINE GLUCONATE CLOTH 2 % EX PADS
6.0000 | MEDICATED_PAD | Freq: Every day | CUTANEOUS | Status: DC
  Administered 2023-04-24 – 2023-04-27 (×4): 6 via TOPICAL

## 2023-04-23 NOTE — Progress Notes (Signed)
  Progress Note   Patient: Thomas [name] JXB:[id number] DOB: 09-15-1930 DOA: 04/22/2023     1 DOS: the patient was seen and examined on 04/23/2023   Brief hospital course: Thomas [name] is a 89 y.o. Caucasian male with medical history significant for osteoarthritis, hypertension, dyslipidemia and GERD, who presented to the emergency room with acute onset of hematuria.  Patient recently was admitted with acute cholecystitis, antibiotics completed, he came back to the hospital had a cholecystostomy performed on 12/7.  Per daughter, patient has been confused and agitated since the procedure, he pulled out his Foley catheter is out in significant hematuria.  Also had a significant acute renal failure.  Patient was seen by urology, Foley catheter was placed again.  Patient was also given fluids. Urine culture came back no growth.   Principal Problem:   Sepsis due to gram-negative UTI (HCC) Active Problems:   Acute kidney injury superimposed on chronic kidney disease (HCC)   Dyslipidemia   GERD without esophagitis   Depression   BPH (benign prostatic hyperplasia)   History of acute cholecystitis   Cholecystostomy care [facility]   Assessment and Plan: SIRS, sepsis ruled out. Gross hematuria secondary to trauma from pulling out Foley catheter. UTI ruled out. Patient had a significant hematuria due to trauma from pulling out Foley catheter.  Bladder has been flushed, hematuria essentially resolved. Urine culture came back with no growth, will discontinue antibiotics.  Acute kidney injury superimposed on chronic kidney disease 3a(HCC) Hypernatremia. Metabolic acidosis. Worsening renal function secondary to obstruction, Foley catheter was placed again, renal function improving.  However, patient has mild hyponatremia on normal saline, will change to half-normal saline. Patient also has mild metabolic acidosis. Will continue monitor with IV fluids.  BPH (benign prostatic hyperplasia) with urinary  retention - We will continue Flomax.  Depression - We will continue Zoloft.  GERD without esophagitis - We will continue PPI therapy.  Dyslipidemia - We will continue statin therapy.       Subjective:  Patient doing better today, hematuria improving.  Baseline confusion, no agitation.  Physical Exam: Vitals:   04/23/23 0000 04/23/23 0224 04/23/23 0457 04/23/23 0809  BP: 113/62 121/71 108/66 124/66  Pulse: 62 (!) 57 (!) 58 (!) 55  Resp: 14 20 16 16   Temp:  98.3 F (36.8 C) 98.2 F (36.8 C) 97.8 F (36.6 C)  TempSrc:  Oral    SpO2: 95% 100% 98% 98%   General exam: Appears calm and comfortable  Respiratory system: Clear to auscultation. Respiratory effort normal. Cardiovascular system: S1 & S2 heard, RRR. No JVD, murmurs, rubs, gallops or clicks. No pedal edema. Gastrointestinal system: Abdomen is nondistended, soft and nontender. No organomegaly or masses felt. Normal bowel sounds heard. Central nervous system: Alert and oriented x2. No focal neurological deficits. Extremities: Symmetric 5 x 5 power. Skin: No rashes, lesions or ulcers Psychiatry:  Mood & affect appropriate.    Data Reviewed:  Reviewed CT scan results, lab results.  Family Communication: Daughter updated over the phone.  Disposition: Status is: Inpatient Remains inpatient appropriate because: Severity of disease, IV treatment.     Time spent: 50 minutes  Author: Marrion Coy, MD 04/23/2023 12:24 PM  For on call review [email or web address].

## 2023-04-23 NOTE — Progress Notes (Signed)
       CROSS COVER NOTE  NAME: PRESTAN BRAND MRN: [id number] DOB : Feb 10, 1931    Concern as stated by nurse / staff   \page received from nurse. Stated concerns is order for CBI but 3 way foley not in place     Pertinent findings on chart review: Patient admitted for bladder outflow obstruction with bilateral hydronephrosis from obstructed foley  Assessment and  Interventions   Assessment: Review of notes and urology recommends : "Continue Foley catheter and manually irrigate as needed to clear any obstructing clots. Insufficient bleeding today to warrant CBI. Would recommend antibiotics to treat possible UTI in the setting of Foley trauma. Would also recommend holding anticoagulation temporarily to allow the urine to clear. "  Plan: Nurse advised to only manually irrigate foley if clinically indicated       Donnie Mesa NP [facility] Cross Cover 7pm-7am - check amion for availability Pager [phone]

## 2023-04-23 NOTE — Evaluation (Signed)
Clinical/Bedside Swallow Evaluation Patient Details  Name: Thomas [name] MRN: [id number] Date of Birth: 11/19/1930  Today's Date: 04/23/2023 Time: SLP Start Time (ACUTE ONLY): 1105 SLP Stop Time (ACUTE ONLY): 1200 SLP Time Calculation (min) (ACUTE ONLY): 55 min  Past Medical History:  Past Medical History:  Diagnosis Date   Arthritis    Carpal tunnel syndrome on both sides    GERD (gastroesophageal reflux disease)    Hyperlipidemia    Hypertension    Past Surgical History:  Past Surgical History:  Procedure Laterality Date   ENDARTERECTOMY Right 03/19/2023   Procedure: ENDARTERECTOMY CAROTID;  Surgeon: Renford Dills, MD;  Location: [facility] ORS;  Service: Vascular;  Laterality: Right;   INGUINAL HERNIA REPAIR  2001   IR PERC CHOLECYSTOSTOMY  04/17/2023   REPLACEMENT TOTAL KNEE Bilateral    HPI:  Pt is a 89 y.o. male with medical history significant of hypertension, baselien chronic Dementia per chart notes, hyperlipidemia, GERD, recent CVA ~4+ weeks ago treated at [facility].  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  Pt admitted this time to the emergency room from his Facility with acute onset of hematuria.  The patient apparently pulled his Foley catheter at his SNF and it has been not been draining.  He has been having nausea and vomiting as well as diminished appetite over the last week. No cough.    Assessment / Plan / Recommendation  Clinical Impression   Pt seen for BSE. Pt awake and alert, HOH. Pt reports eyes "scratchy from the eyelashes". He was having difficulty keeping them open consistently. He blinked often. This was noted last admit. Pt has Baseline Dementia dx'd. Noted slow UEs during self-feeding. Min slower talking and processing -- noted previous cognitive-linguistic assessment last  admit 02/2023.  On RA, afebrile. WBC WNL.   Pt appears to present w/ oral phase Dysphagia which can impact the pharyngeal phase of swallowing; decreased sensorimotor awareness/response noted w/ oral intake. Similar was starting to be noted last admit. Pt is Edentulous also. Suspect the majority of pt's presentation is d/t his Baseline Cognitive decline; Dementia per chart. When following general aspiration precautions using a modified dysphagia diet of Puree foods w/ thin liquids, No overt clinical s/s of aspiration were noted during po's.  Pt appears at reduced risk for aspiration/aspiration pneumonia w/ Supervision and Support at meals w/ a Dysphagia diet and following general aspiration precautions. However, pt does have challenging factors that could impact his oropharyngeal swallowing to include deconditioning/weakness, prior CVAs, Baseline Cognitive decline, Dementia per chart, and advanced age as well as slowness in UEs hampering his self-feeding abilities now requiring increased support w/ feeding at meals. These factors can increase risk for aspiration, dysphagia as well as decreased oral intake overall.    During po trials, pt consumed all consistencies w/ no overt coughing, decline in vocal quality, or change in respiratory presentation during/post trials. O2 sats 98% when checked. Oral phase revealed less timely bolus management, mastication, and control of bolus propulsion for A-P transfer for swallowing w/ all consistencies. Oral clearing was slow w/ verbal cues given to use lingual sweeping w/ attempted minced foods. Diffuse oral residue noted intermittently w/ purees even. Min oral holding and delay in swallowing of thin liquids observed -- Niece stated she had been "seeing that at the NH recently" and she would cue  him to swallow. Oral clearing achieved when alternating foods/liquids. Time given w/ the increased textured trials attempted d/t Edentulous status BUT too much time was required  overall for Minced food consistency to be functional for pt's intake/needs.  OM Exam appeared grossly [facility] w/ no unilateral lingual weakness. No bolus loss. Speech Clear, soft. Pt helped to Hold Cup to drink but noted slow motor movements and decreased awareness w/ task overall. Much verbal/tactile cueing given during self-feeding, holding of cup.    Recommend a Dysphagia level 1 w/ moistened foods for ease/flavor; Thin liquids -- carefully monitor straw use for single sips and pt should Hold Cup when drinking. Use of the Dysphagia drink cup recommended for flow control of liquids. Recommend general aspiration precautions, sitting fully upright w/ all oral intake -- 100% Supervision at meals to support feeding when needed. Reduce distractions during meals. Small bites/sips slowly. Alternate foods/liquids to aid oral clearing; lingual sweeping. Pills CRUSHED in Puree for safer, easier swallowing -- pt has done this in the past, and it was encourged now and for D/C again.   Education given on swallowing/dysphagia; Pills in Puree; food/diet consistencies and easy to eat options; general aspiration precautions; Supervision needed to pt and Niece, family present. No further Acute ST services indicated as pt appears close to his Baseline when following his precautions and given full support. However, pt may be declining some in his oral phase of swallowing(?) w/in context of illness both acute and Chronic -- suspect impact from his Baseline Cognitive decline. Recommend pt have f/u at his next venue of care for any further needs and/or consideration of diet upgrade. MD/NSG to reconsult if any New needs arise during admit. NSG updated, agreed. MD updated. Recommend Palliative Care and Dietician f/u for support. SLP Visit Diagnosis: Dysphagia, oral phase (R13.11) (baseline Dementia per chart; prior strokes)    Aspiration Risk  Mild aspiration risk;Risk for inadequate nutrition/hydration (reduced when following  general aspiration precautions, diet consistency rec'd, and feeding support)    Diet Recommendation   Thin;Dysphagia 1 (puree) (gravies) = a Dysphagia level 1 w/ moistened foods for ease/flavor; Thin liquids -- carefully monitor straw use for single sips and pt should Hold Cup when drinking. Use of the Dysphagia drink cup recommended for flow control of liquids. Recommend general aspiration precautions, sitting fully upright w/ all oral intake -- 100% Supervision at meals to support feeding when needed. Reduce distractions during meals. Small bites/sips slowly. Alternate foods/liquids to aid oral clearing; lingual sweeping.   Medication Administration: Crushed with puree    Other  Recommendations Recommended Consults:  (Palliative Care; Dietician) Oral Care Recommendations: Oral care BID;Oral care before and after PO;Staff/trained caregiver to provide oral care    Recommendations for follow up therapy are one component of a multi-disciplinary discharge planning process, led by the attending physician.  Recommendations may be updated based on patient status, additional functional criteria and insurance authorization.  Follow up Recommendations Follow physician's recommendations for discharge plan and follow up therapies (at his next venue of care if indicated)      Assistance Recommended at Discharge  FULL   Functional Status Assessment Patient has had a recent decline in their functional status and/or demonstrates limited ability to make significant improvements in function in a reasonable and predictable amount of time  Frequency and Duration  (n/a)   (n/a)       Prognosis Prognosis for improved oropharyngeal function: Fair Barriers to Reach Goals: Cognitive deficits;Time post onset;Severity of deficits Barriers/Prognosis Comment: baseline  Cognitive decline per chart; missing Dentition; slow to self-feed d/t weakness and deconditioning and self-awareness      Swallow Study   General  Date of Onset: 04/21/23 HPI: Pt is a 89 y.o. male with medical history significant of hypertension, baselien chronic Dementia per chart notes, hyperlipidemia, GERD, recent CVA ~4+ weeks ago treated at [facility].  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  Pt admitted this time to the emergency room from his Facility with acute onset of hematuria.  The patient apparently pulled his Foley catheter at his SNF and it has been not been draining.  He has been having nausea and vomiting as well as diminished appetite over the last week. No cough. Type of Study: Bedside Swallow Evaluation Previous Swallow Assessment: 10/24; 11/24; 12/24 w/ last rec'd a dys level 2 w/ thins d/t oral phase/prep time issues. Diet Prior to this Study: Dysphagia 2 (finely chopped);Thin liquids (Level 0) Temperature Spikes Noted: No Respiratory Status: Room air History of Recent Intubation: No Behavior/Cognition: Alert;Cooperative;Pleasant mood;Distractible;Requires cueing;Confused (Baseline Dementia per chart) Oral Cavity Assessment: Within Functional Limits Oral Care Completed by SLP: Yes Oral Cavity - Dentition: Edentulous Vision:  (overall impaired - Cognitive) Self-Feeding Abilities: Able to feed self;Needs assist;Total assist (able to hold own cup to drink given support) Patient Positioning: Upright in bed (needed full support) Baseline Vocal Quality: Normal;Low vocal intensity Volitional Cough:  (Fair) Volitional Swallow: Able to elicit    Oral/Motor/Sensory Function Overall Oral Motor/Sensory Function: Generalized oral weakness (no unilateral)   Ice Chips Ice chips: Within functional limits Presentation: Spoon (fed; 2 trials)   Thin Liquid Thin Liquid: Impaired Presentation: Cup;Self Fed;Straw (fully supported; 6 trials via  each) Oral Phase Impairments: Poor awareness of bolus Oral Phase Functional Implications: Oral holding;Prolonged oral transit Pharyngeal  Phase Impairments:  (none) Other Comments: Cognitive in nature    Nectar Thick Nectar Thick Liquid: Not tested   Honey Thick Honey Thick Liquid: Not tested   Puree Puree: Impaired Presentation: Spoon (fed; 10+ trials) Oral Phase Impairments: Poor awareness of bolus Oral Phase Functional Implications: Prolonged oral transit;Oral residue (min) Pharyngeal Phase Impairments:  (none) Other Comments: Cognitive in nature   Solid     Solid: Impaired Presentation: Spoon (fed; 4 trials) Oral Phase Impairments: Impaired mastication;Poor awareness of bolus Oral Phase Functional Implications: Oral residue;Impaired mastication;Prolonged oral transit Pharyngeal Phase Impairments:  (none) Other Comments: edentulous as well as Cognitive        Jerilynn Som, MS, CCC-SLP Speech Language Pathologist Rehab Services; [facility] [place] [phone] (ascom) Vraj Denardo 04/23/2023,5:02 PM

## 2023-04-23 NOTE — TOC CM/SW Note (Signed)
Per chart review, patient admitted from [facility]. He discharged to this facility on 12/9. They confirmed they can accept him back when stable. PT/OT evals pending. CSW unable to reach daughter. Left voicemail for [employer].  Charlynn Court, CSW [phone]

## 2023-04-23 NOTE — Progress Notes (Signed)
    04/23/23 0224  Provider Notification  Provider Name/Title Dr. Crist Infante  Date Provider Notified 04/23/23  Time Provider Notified 0259  Method of Notification Page  Notification Reason Pt was trasferrd from ED to 2C212 with an order of continue bladder irrigation 3000 ml. The current Caude foley cath does not have tipple lumens for bladder irrigation.   Provider response Evaluate remotely. The provider verified and suggested to do intermittent  manual bladder irrigation PRN as needed if blood clot is suspected with signs of low urine output and  bladder pain or bladder distention. We did one time of  200 ml NSS irrigation with support from 2 RNs staff from ICU and  Bill, a charge nurse at 04:00 am with no difficulties due to low urine out put and dark red color. The on-call provider made aware. We will continue to monitor.  Date of Provider Response 04/23/23  Time of Provider Response 0300    Filiberto Pinks, RN

## 2023-04-23 NOTE — TOC Initial Note (Signed)
Transition of Care [facility]) - Initial/Assessment Note    Patient Details  Name: Thomas [name] MRN: [id number] Date of Birth: 10-Apr-1931  [facility]) CM/SW Contact:    Margarito Liner, LCSW Phone Number: 04/23/2023, 1:49 PM  Clinical Narrative:   Received call back from son-in-law. He confirmed plan to return to [facility] at discharge.               Expected Discharge Plan: Skilled Nursing Facility Barriers to Discharge: Continued Medical Work up   Patient Goals and CMS Choice     Choice offered to / list presented to : Adult Children      Expected Discharge Plan and Services     Post Acute Care Choice: Skilled Nursing Facility Living arrangements for the past 2 months: Single Family Home                                      Prior Living Arrangements/Services Living arrangements for the past 2 months: Single Family Home   Patient language and need for interpreter reviewed:: Yes Do you feel safe going back to the place where you live?: Yes      Need for Family Participation in Patient Care: Yes (Comment) Care giver support system in place?: Yes (comment)   Criminal Activity/Legal Involvement Pertinent to Current Situation/Hospitalization: No - Comment as needed  Activities of Daily Living   ADL Screening (condition at time of admission) Independently performs ADLs?: No Does the patient have a NEW difficulty with bathing/dressing/toileting/self-feeding that is expected to last >3 days?: Yes (Initiates electronic notice to provider for possible OT consult) Does the patient have a NEW difficulty with getting in/out of bed, walking, or climbing stairs that is expected to last >3 days?: Yes (Initiates electronic notice to provider for possible PT consult) Does the patient have a NEW difficulty with communication that is expected to last >3 days?: Yes (Initiates electronic notice to provider for possible SLP consult) Is the patient deaf or have  difficulty hearing?: No Does the patient have difficulty seeing, even when wearing glasses/contacts?: Yes Does the patient have difficulty concentrating, remembering, or making decisions?: Yes  Permission Sought/Granted Permission sought to share information with : Facility Medical sales representative, Family Supports    Share Information with NAME: Gigi Gin  Permission granted to share info w AGENCY: [facility]  Permission granted to share info w Relationship: Son-in-law  Permission granted to share info w Contact Information: [phone]  Emotional Assessment Appearance:: Appears stated age Attitude/Demeanor/Rapport: Unable to Assess Affect (typically observed): Unable to Assess Orientation: : Oriented to Self Alcohol / Substance Use: Not Applicable Psych Involvement: No (comment)  Admission diagnosis:  Gross hematuria [R31.0] Acute UTI [N39.0] AKI (acute kidney injury) (HCC) [N17.9] Sepsis due to gram-negative UTI (HCC) [A41.50, N39.0] Patient Active Problem List   Diagnosis Date Noted   Urinary retention due to benign prostatic hyperplasia 04/23/2023   Gross hematuria 04/23/2023   Sepsis due to gram-negative UTI (HCC) 04/22/2023   Acute kidney injury superimposed on chronic kidney disease (HCC) 04/22/2023   Dyslipidemia 04/22/2023   GERD without esophagitis 04/22/2023   Depression 04/22/2023   BPH (benign prostatic hyperplasia) 04/22/2023   History of acute cholecystitis 04/22/2023   Cholecystostomy care (HCC) 04/22/2023   Acute cholecystitis 04/13/2023   Acute CVA (cerebrovascular accident) (HCC) 02/13/2023   Rhabdomyolysis 02/12/2023   Hypophosphatemia 05/22/2021   Traumatic rhabdomyolysis (  HCC)    Neuropathy    Rhabdomyolysis due to COVID-19 05/19/2021   Generalized weakness    Acute respiratory failure with hypoxia (HCC)    Insomnia    Acute metabolic encephalopathy    COVID-19 virus infection 05/28/2020   GERD (gastroesophageal reflux disease)     Hypertension    HLD (hyperlipidemia)    Multiple lung nodules 12/05/2018   Mass of right side of neck 01/27/2018   PCP:  Barbette Reichmann, MD Pharmacy:   [facility] [phone] - [place], [place] - [address] [place] [address] Phone: [phone] Fax: [phone]     Social Drivers of Health (SDOH) Social History: SDOH Screenings   Food Insecurity: Patient Unable To Answer (04/23/2023)  Housing: Patient Unable To Answer (04/23/2023)  Transportation Needs: Patient Unable To Answer (04/23/2023)  Utilities: Patient Unable To Answer (04/23/2023)  Financial Resource Strain: Low Risk  (10/12/2022)   Received from [facility]  Tobacco Use: Low Risk  (04/21/2023)  Health Literacy: High Risk (03/12/2023)   Received from [facility]   SDOH Interventions:     Readmission Risk Interventions     No data to display

## 2023-04-23 NOTE — Evaluation (Signed)
Physical Therapy Evaluation Patient Details Name: Thomas [name] MRN: [id number] DOB: 1930/05/22 Today's Date: 04/23/2023  History of Present Illness  Thomas [name] is a 89 y.o. Caucasian male with medical history significant for osteoarthritis, hypertension, dyslipidemia and GERD, who presented to the emergency room with acute onset of hematuria.  The patient apparently pulled his Foley catheter at his SNF and it has been not been draining.  He has been having nausea and vomiting as well as diminished appetite over the last week.  He was recently discharged from here after being managed for acute CVA and acute cholecystitis status post cholecystostomy tube.  He denied any fever or chills.  No chest pain or palpitations.  No cough or wheezing or dyspnea.   Clinical Impression  Pt admitted with above diagnosis. Pt currently with functional limitations due to the deficits listed below (see PT Problem List). Pt received upright in be with SIL present agreeable to PT eval. Pt fairly oriented to current situation. SIL reports recently come from SNF where he oly had 1 PT session and is still significantly limited in functional mobility needing assist for all tasks. Prior to recent admissions and last CVA, pt is mod-I with RW.   To date, pt is able to follow all single and two step commands consistently. Relied on increased time, bed features and modA+1 for supine to sit. MaxA for anterior scoot for feet to reach floor. Initial L lateral lean but this improves with feet on floor and multimodal cuing. However with sitting pt has overall regular posterior leaning due to core weakness needing regular VC's to correct. Pt attempts to place glasses on face in sitting but pt has significant motor control deficits with fine motor in LUE holding glasses needing assist with PT with also inability to maintain core stability without at least one hand at bedside. X2 STS performed needing mod to maxA depending on bed height with  significant posterior lean. Unable to hold each rep greater than ~5 seconds. Pt maxA for return to supine and total A for scooting up in bed with all needs in reach. Pt appropriate to return to SNF as pt far below recently reported baseline of being mod-I with RW.           If plan is discharge home, recommend the following: Two people to help with walking and/or transfers;Two people to help with bathing/dressing/bathroom;Assistance with cooking/housework;Assistance with feeding;Direct supervision/assist for medications management;Assist for transportation;Supervision due to cognitive status;Help with stairs or ramp for entrance   Can travel by private vehicle   No    Equipment Recommendations Other (comment) (TBD by next venue of care)  Recommendations for Other Services       Functional Status Assessment Patient has had a recent decline in their functional status and demonstrates the ability to make significant improvements in function in a reasonable and predictable amount of time.     Precautions / Restrictions Precautions Precautions: Fall Precaution Comments: R side drain; foley Restrictions Weight Bearing Restrictions Per Provider Order: No      Mobility  Bed Mobility Overal bed mobility: Needs Assistance Bed Mobility: Supine to Sit   Sidelying to sit: Mod assist, HOB elevated, Used rails Supine to sit: Max assist     General bed mobility comments: totalA +1 with bed in trendelenburg to scoot upward in bed Patient Response: Cooperative  Transfers Overall transfer level: Needs assistance Equipment used: Rolling walker (2 wheels) Transfers: Sit to/from Stand Sit to Stand: Max assist, From  elevated surface                Ambulation/Gait                  Stairs            Wheelchair Mobility     Tilt Bed Tilt Bed Patient Response: Cooperative  Modified Rankin (Stroke Patients Only)       Balance Overall balance assessment: Needs  assistance Sitting-balance support: Feet supported, Bilateral upper extremity supported Sitting balance-[name]: Poor Sitting balance - Comments: Initial L lateral lean that's corrected with feet on the ground and VC's. Regular posterior leaning. Postural control: Posterior lean, Left lateral lean Standing balance support: Bilateral upper extremity supported, Reliant on assistive device for balance Standing balance-[name]: Poor Standing balance comment: posterior lean in standing. Difficutly following cuing for forward weight shift.                             Pertinent Vitals/Pain Pain Assessment Pain Assessment: No/denies pain    Home Living Family/patient expects to be discharged to:: Skilled nursing facility Living Arrangements: Alone                 Additional Comments: Pt recently d/c from [facility] to SNF rehab less than a week ago.    Prior Function Prior Level of Function : Patient poor historian/Family not available             Mobility Comments: Pt's SIL reports only 1 PT session at SNF before being readmitted. Still reliant on regular assist from staff for mobility. ADLs Comments: Assist required, MIN-MAX A ADLs when d/c from [facility] a few days ago.     Extremity/Trunk Assessment   Upper Extremity Assessment Upper Extremity Assessment: LUE deficits/detail LUE Deficits / Details: recent old CVA with LUE weakness/inattention. Once asked to use LUE, pt is able to engage it with 4-/5 strength. LUE Coordination: decreased gross motor;decreased fine motor (unable to perform fine motor holding glasses to palce them on his face.)    Lower Extremity Assessment Lower Extremity Assessment: Generalized weakness    Cervical / Trunk Assessment Cervical / Trunk Assessment:  (pt leans to the L while seated EOB.)  Communication   Communication Communication: No apparent difficulties Following commands: Follows one step commands consistently Cueing  Techniques: Verbal cues;Tactile cues  Cognition Arousal: Alert Behavior During Therapy: WFL for tasks assessed/performed Overall Cognitive Status: History of cognitive impairments - at baseline                                 General Comments: Alert and oriented to place, year, person. Unable to really articulate the reason he was admitted to the hospital. Followed all single step commands with 100% accuracy.        General Comments General comments (skin integrity, edema, etc.): Blood noted to be leaking from pt's penis at end of session; alerted RN to check on pt; RN confirmed she would.    Exercises     Assessment/Plan    PT Assessment Patient needs continued PT services  PT Problem List Decreased strength;Decreased mobility;Decreased safety awareness;Decreased range of motion;Decreased coordination;Decreased activity tolerance;Decreased cognition;Decreased balance;Decreased knowledge of use of DME;Decreased knowledge of precautions;Decreased skin integrity       PT Treatment Interventions DME instruction;Therapeutic exercise;Gait training;Balance training;Stair training;Neuromuscular re-education;Functional mobility training;Cognitive remediation;Therapeutic activities;Patient/family education  PT Goals (Current goals can be found in the Care Plan section)  Acute Rehab PT Goals Patient Stated Goal: to go back to rehab. PT Goal Formulation: With patient Time For Goal Achievement: 05/07/23 Potential to Achieve Goals: Fair    Frequency Min 1X/week     Co-evaluation               AM-PAC PT "6 Clicks" Mobility  Outcome Measure Help needed turning from your back to your side while in a flat bed without using bedrails?: A Lot Help needed moving from lying on your back to sitting on the side of a flat bed without using bedrails?: A Lot Help needed moving to and from a bed to a chair (including a wheelchair)?: A Lot Help needed standing up from a chair  using your arms (e.g., wheelchair or bedside chair)?: Total Help needed to walk in hospital room?: Total Help needed climbing 3-5 steps with a railing? : Total 6 Click Score: 9    End of Session Equipment Utilized During Treatment: Gait belt Activity Tolerance: Patient tolerated treatment well Patient left: in bed;with bed alarm set;with call bell/phone within reach;with family/visitor present Nurse Communication: Mobility status PT Visit Diagnosis: Unsteadiness on feet (R26.81);Other abnormalities of gait and mobility (R26.89);Repeated falls (R29.6);Muscle weakness (generalized) (M62.81);Hemiplegia and hemiparesis Hemiplegia - Right/Left: Left Hemiplegia - dominant/non-dominant: Non-dominant Hemiplegia - caused by: Cerebral infarction    Time: 2841-3244 PT Time Calculation (min) (ACUTE ONLY): 29 min   Charges:   PT Evaluation $PT Eval Moderate Complexity: 1 Mod PT Treatments $Therapeutic Activity: 8-22 mins PT General Charges $$ ACUTE PT VISIT: 1 Visit         Delphia Grates. Fairly IV, PT, DPT Physical Therapist- [place]  [facility]  04/23/2023, 3:56 PM

## 2023-04-23 NOTE — Progress Notes (Signed)
Urology Inpatient Progress Note  Subjective: No acute events overnight.  He is afebrile, VSS. Creatinine down today, 1.85.  No CBC ordered.  Urine culture has finalized with no growth, blood cultures pending with no growth at 1 day.  On antibiotics as below. Foley catheter in place draining yellow urine with nonocclusive blood product sediment. He reports feeling much better today and denies pain.  Anti-infectives: Anti-infectives (From admission, onward)    Start     Dose/Rate Route Frequency Ordered Stop   04/22/23 1030  cefTRIAXone (ROCEPHIN) 2 g in sodium chloride 0.9 % 100 mL IVPB        2 g 200 mL/hr over 30 Minutes Intravenous Every 24 hours 04/22/23 1016     04/22/23 0045  cefTRIAXone (ROCEPHIN) 1 g in sodium chloride 0.9 % 100 mL IVPB        1 g 200 mL/hr over 30 Minutes Intravenous  Once 04/22/23 0034 04/22/23 0253       Current Facility-Administered Medications  Medication Dose Route Frequency Provider Last Rate Last Admin   0.45 % sodium chloride infusion   Intravenous Continuous Marrion Coy, MD 75 mL/hr at 04/23/23 0817 New Bag at 04/23/23 0817   acetaminophen (TYLENOL) tablet 650 mg  650 mg Oral Q6H PRN Mansy, Jan A, MD       Or   acetaminophen (TYLENOL) suppository 650 mg  650 mg Rectal Q6H PRN Mansy, Jan A, MD       atorvastatin (LIPITOR) tablet 40 mg  40 mg Oral Daily Mansy, Jan A, MD   40 mg at 04/23/23 0819   bisacodyl (DULCOLAX) suppository 10 mg  10 mg Rectal Daily PRN Mansy, Jan A, MD       cefTRIAXone (ROCEPHIN) 2 g in sodium chloride 0.9 % 100 mL IVPB  2 g Intravenous Q24H Mansy, Jan A, MD 200 mL/hr at 04/23/23 0821 2 g at 04/23/23 6578   cyanocobalamin (VITAMIN B12) tablet 1,000 mcg  1,000 mcg Oral Daily Mansy, Jan A, MD   1,000 mcg at 04/23/23 0819   magnesium hydroxide (MILK OF MAGNESIA) suspension 30 mL  30 mL Oral Daily PRN Mansy, Jan A, MD       ondansetron [facility]) tablet 4 mg  4 mg Oral Q6H PRN Mansy, Jan A, MD       Or   ondansetron [facility])  injection 4 mg  4 mg Intravenous Q6H PRN Mansy, Jan A, MD       pantoprazole (PROTONIX) EC tablet 40 mg  40 mg Oral Daily Mansy, Jan A, MD   40 mg at 04/23/23 0819   polyethylene glycol (MIRALAX / GLYCOLAX) packet 17 g  17 g Oral BID Mansy, Jan A, MD   17 g at 04/23/23 4696   polyvinyl alcohol (LIQUIFILM TEARS) 1.4 % ophthalmic solution 2 drop  2 drop Both Eyes PRN Mansy, Jan A, MD       sertraline (ZOLOFT) tablet 25 mg  25 mg Oral Daily Mansy, Jan A, MD   25 mg at 04/23/23 0819   tamsulosin (FLOMAX) capsule 0.4 mg  0.4 mg Oral QPC supper Mansy, Jan A, MD   0.4 mg at 04/22/23 1823   traZODone (DESYREL) tablet 25 mg  25 mg Oral QHS PRN Mansy, Jan A, MD         Objective: Vital signs in last 24 hours: Temp:  [97.5 F (36.4 C)-98.4 F (36.9 C)] 97.8 F (36.6 C) (12/13 0809) Pulse Rate:  [55-75] 55 (12/13 0809) Resp:  [  14-20] 16 (12/13 0809) BP: (94-124)/(49-83) 124/66 (12/13 0809) SpO2:  [95 %-100 %] 98 % (12/13 0809)  Intake/Output from previous day: 12/12 0701 - 12/13 0700 In: 3738.6 [P.O.:250; I.V.:3188.6; IV Piggyback:100] Out: 1575 [Urine:1450; Drains:125] Intake/Output this shift: No intake/output data recorded.  Physical Exam Vitals and nursing note reviewed.  Constitutional:      General: He is not in acute distress.    Appearance: He is not ill-appearing, toxic-appearing or diaphoretic.  HENT:     Head: Normocephalic and atraumatic.  Pulmonary:     Effort: Pulmonary effort is normal. No respiratory distress.  Skin:    General: Skin is warm and dry.  Neurological:     Mental Status: He is alert.  Psychiatric:        Mood and Affect: Mood normal.        Behavior: Behavior normal.     Lab Results:  Recent Labs    04/21/23 1805 04/22/23 0425 04/22/23 1100  WBC 23.4* 19.5*  --   HGB 12.6* 9.7* 11.4*  HCT 39.6 33.2* 36.7*  PLT 407* 317  --    BMET Recent Labs    04/22/23 0425 04/23/23 0531  NA 137 147*  K 3.9 3.6  CL 110 120*  CO2 14* 21*  GLUCOSE  104* 83  BUN 38* 33*  CREATININE 2.91* 1.85*  CALCIUM 7.6* 8.0*   PT/INR Recent Labs    04/22/23 0425  LABPROT 17.0*  INR 1.4*   ABG Recent Labs    04/23/23 0532  HCO3 21.7    Studies/Results: CT Renal Stone Study Result Date: 04/21/2023 CLINICAL DATA:  Hematuria, mild functioning Foley catheter EXAM: CT ABDOMEN AND PELVIS WITHOUT CONTRAST TECHNIQUE: Multidetector CT imaging of the abdomen and pelvis was performed following the standard protocol without IV contrast. RADIATION DOSE REDUCTION: This exam was performed according to the departmental dose-optimization program which includes automated exposure control, adjustment of the mA and/or kV according to patient size and/or use of iterative reconstruction technique. COMPARISON:  04/16/2023 FINDINGS: Lower chest: Lung bases demonstrate mild scarring stable from the prior study. Hepatobiliary: Liver is within normal limits. Cholecystostomy catheter is noted in place. Gallbladder is decompressed. Pancreas: Unremarkable. No pancreatic ductal dilatation or surrounding inflammatory changes. Spleen: Normal in size without focal abnormality. Adrenals/Urinary Tract: Adrenal glands are within normal limits. Bilateral hydronephrosis and hydroureter is noted without evidence of obstructing stones. Some dependent density is noted within the bladder consistent with the given clinical history of hematuria. Air is noted within the bladder. The bladder is significantly over distended. A Foley catheter is noted in place although lies within the midportion of the penile urethra. The tip extends to the level of the inferior prostatic urethra. The balloon should be deflated and the catheter advanced into the bladder to allow for optimum function. Stable simple cysts are noted within the kidneys bilaterally. No follow-up is recommended. Stomach/Bowel: No obstructive or inflammatory changes of the colon are seen. The appendix is not well visualized although no  inflammatory changes are seen. Small bowel and stomach are within normal limits with the exception of a moderate to large hiatal hernia. Vascular/Lymphatic: Atherosclerotic calcifications of the abdominal aorta are noted. There is dilatation of the infrarenal aorta to 4.2 cm in greatest dimension. This is unchanged from the prior exam. No lymphadenopathy is noted. Reproductive: Prostate is prominent indenting upon the inferior aspect of the bladder. Other: No abdominal wall hernia or abnormality. No abdominopelvic ascites. Musculoskeletal: No acute bony abnormality is noted. Chronic changes  at the T12-L1 interspace are noted. IMPRESSION: The previously seen Foley catheter has been withdrawn and now lies in the midportion of the penile urethra. This should be deflated and advanced into the bladder to allow for optimum function. Abdominal aortic aneurysm measuring 4.2 cm. Recommend CTA or MRA, as appropriate, in 12 months and referral to vascular specialist as clinically necessary given the patient's advanced age. Reference: Journal of Vascular Surgery 67.1 (2018): 2-77. J Am Coll Radiol [phone]. Cholecystostomy tube is noted in satisfactory position. Bilateral hydronephrosis related to over distention of the urinary bladder. Electronically Signed   By: Alcide Clever M.D.   On: 04/21/2023 23:51     Assessment & Plan: 89 year old male with AKI due to displaced Foley catheter and gross hematuria due to Foley trauma.  He is hematuria has significantly cleared overnight after Foley replacement, manual irrigation, and holding anticoagulation.  No indication for CBI at this time.  His Foley is draining well and I elected not to further irrigate him this morning.  Recommendations: -Continue Foley catheter and manually irrigate as needed to keep it draining, I have discontinued erroneous orders for CBI -Okay to stop antibiotics after his a.m. dose today -Okay to resume anticoagulation in 24 to 48 hours if  his urine remains clear -Will schedule outpatient voiding trial in clinic  [facility], PA-C 04/23/2023

## 2023-04-23 NOTE — Evaluation (Signed)
Occupational Therapy Evaluation Patient Details Name: Thomas [name] MRN: [id number] DOB: February 15, 1931 Today's Date: 04/23/2023   History of Present Illness Thomas [name] is a 89 y.o. Caucasian male with medical history significant for osteoarthritis, hypertension, dyslipidemia and GERD, who presented to the emergency room with acute onset of hematuria.  The patient apparently pulled his Foley catheter at his SNF and it has been not been draining.  He has been having nausea and vomiting as well as diminished appetite over the last week.  He was recently discharged from here after being managed for acute CVA and acute cholecystitis status post cholecystostomy tube.  He denied any fever or chills.  No chest pain or palpitations.  No cough or wheezing or dyspnea.   Clinical Impression   Patient received for OT evaluation. See flowsheet below for details of function. Generally, patient requiring MIN-MOD x2 for bed mobility, unwilling to try functional mobility today, but sat up EOB x10 minutes with Cga-MIN A balance, and overall MIN-MAX A for ADLs. Patient will benefit from continued OT while in acute care.       If plan is discharge home, recommend the following: Two people to help with walking and/or transfers;A lot of help with bathing/dressing/bathroom;Assistance with cooking/housework;Assist for transportation;Help with stairs or ramp for entrance;Supervision due to cognitive status;Direct supervision/assist for medications management;Direct supervision/assist for financial management    Functional Status Assessment  Patient has had a recent decline in their functional status and demonstrates the ability to make significant improvements in function in a reasonable and predictable amount of time.  Equipment Recommendations  Other (comment) (defer to next level of care)    Recommendations for Other Services       Precautions / Restrictions Precautions Precautions: Fall;Other  (comment) Precaution Comments: R side drain; foley Restrictions Weight Bearing Restrictions Per Provider Order: No      Mobility Bed Mobility Overal bed mobility: Needs Assistance Bed Mobility: Supine to Sit, Sit to Supine     Supine to sit: Mod assist, +2 for physical assistance, HOB elevated, Used rails Sit to supine: Mod assist, +2 for physical assistance   General bed mobility comments: total x2 for scooting up in bed.    Transfers                   General transfer comment: Patient declined transfer to standing or to recliner today; says he's too fatigued.      Balance Overall balance assessment: Needs assistance Sitting-balance support: Feet supported, Bilateral upper extremity supported Sitting balance-[name]: Poor Sitting balance - Comments: L lean; does well with goal, such as OT sitting on pt's R side and telling pt to lean over to give OT a hug; he was able to do so. MIN-CGA sitting balance.                                   ADL either performed or assessed with clinical judgement   ADL Overall ADL's : Needs assistance/impaired Eating/Feeding: Set up;Bed level Eating/Feeding Details (indicate cue type and reason): drinking iced tea at end of session Grooming: Wash/dry face;Bed level;Set up Grooming Details (indicate cue type and reason): Pt also donned glasses                               General ADL Comments: Anticipate MAX-Total for LB ADLs and set up-MIN  A for UB ADLs from chair; seated EOB requires assist for sitting balance.     Vision Baseline Vision/History: 1 Wears glasses       Perception         Praxis         Pertinent Vitals/Pain Pain Assessment Pain Assessment: No/denies pain     Extremity/Trunk Assessment Upper Extremity Assessment Upper Extremity Assessment: LUE deficits/detail LUE Deficits / Details: recent old CVA with LUE weakness/inattention. Once asked to use LUE, pt is able to engage  it with 4-/5 strength.   Lower Extremity Assessment Lower Extremity Assessment: Defer to PT evaluation (pt is able to kick BIL LE into the air with good hip flexion at bed level.)   Cervical / Trunk Assessment Cervical / Trunk Assessment:  (pt leans to the L while seated EOB.)   Communication Communication Communication: No apparent difficulties   Cognition Arousal: Alert Behavior During Therapy: WFL for tasks assessed/performed Overall Cognitive Status: History of cognitive impairments - at baseline                                 General Comments: Pt is able to follow verbal cues; kind and cooperative; c/o fatigue and only agreeable to EOB today. Unable to state his age, why he came here, the current date (although does know that Christmas is coming up), the year. However, pt does remember that he pulled his catheter out and states he knows he's not supposed to do that again.     General Comments  Blood noted to be leaking from pt's penis at end of session; alerted RN to check on pt; RN confirmed she would.    Exercises     Shoulder Instructions      Home Living Family/patient expects to be discharged to:: Skilled nursing facility                                 Additional Comments: Pt recently d/c from [facility] to SNF rehab less than a week ago.      Prior Functioning/Environment Prior Level of Function : Patient poor historian/Family not available             Mobility Comments: Pt unable to provide information about what he was doing at rehab. When he d/c from [facility] a few days ago, he was able to stand with STEDY MOD A x2 and t/f to recliner. Typically he ambulates with RW. ADLs Comments: Assist required, MIN-MAX A ADLs when d/c from [facility] a few days ago.        OT Problem List: Decreased strength;Decreased safety awareness;Decreased activity tolerance;Impaired balance (sitting and/or standing);Decreased knowledge of use of DME or AE;Decreased  cognition;Decreased coordination;Impaired UE functional use      OT Treatment/Interventions: Self-care/ADL training;Therapeutic exercise;Neuromuscular education;DME and/or AE instruction;Manual therapy;Balance training;Patient/family education;Energy conservation;Therapeutic activities;Cognitive remediation/compensation    OT Goals(Current goals can be found in the care plan section) Acute Rehab OT Goals Patient Stated Goal: Get better OT Goal Formulation: With patient Time For Goal Achievement: 05/07/23 Potential to Achieve Goals: Good ADL Goals Pt Will Perform Grooming: with modified independence;sitting Pt Will Perform Lower Body Dressing: with mod assist;sit to/from stand Pt Will Transfer to Toilet: with mod assist;bedside commode;stand pivot transfer Pt Will Perform Toileting - Clothing Manipulation and hygiene: with min assist;sit to/from stand  OT Frequency: Min 1X/week    Co-evaluation  AM-PAC OT "6 Clicks" Daily Activity     Outcome Measure Help from another person eating meals?: None Help from another person taking care of personal grooming?: A Little Help from another person toileting, which includes using toliet, bedpan, or urinal?: Total Help from another person bathing (including washing, rinsing, drying)?: A Lot Help from another person to put on and taking off regular upper body clothing?: A Little Help from another person to put on and taking off regular lower body clothing?: Total 6 Click Score: 14   End of Session Nurse Communication: Mobility status;Other (comment) (need for catheter care)  Activity Tolerance: Patient tolerated treatment well;Patient limited by fatigue Patient left: in bed;with call bell/phone within reach;with bed alarm set  OT Visit Diagnosis: Unsteadiness on feet (R26.81);Other abnormalities of gait and mobility (R26.89);Repeated falls (R29.6);Muscle weakness (generalized) (M62.81);History of falling (Z91.81)                 Time: 1335-1405 OT Time Calculation (min): 30 min Charges:  OT General Charges $OT Visit: 1 Visit OT Evaluation $OT Eval Moderate Complexity: 1 Mod OT Treatments $Therapeutic Activity: 8-22 mins  Linward Foster, MS, OTR/L  Alvester Morin 04/23/2023, 2:31 PM

## 2023-04-23 NOTE — Hospital Course (Signed)
Thomas [name] is a 89 y.o. Caucasian male with medical history significant for osteoarthritis, hypertension, dyslipidemia and GERD, who presented to the emergency room with acute onset of hematuria.  Patient recently was admitted with acute cholecystitis, antibiotics completed, he came back to the hospital had a cholecystostomy performed on 12/7.  Per daughter, patient has been confused and agitated since the procedure, he pulled out his Foley catheter is out in significant hematuria.  Also had a significant acute renal failure.  Patient was seen by urology, Foley catheter was placed again.  Patient was also given fluids. Urine culture came back no growth.

## 2023-04-23 NOTE — Progress Notes (Signed)
Pt's urine with many small clots in it. Night nurse notified me that urology is supposed to come this morning to put in a 3-port foley so we can do CBI.

## 2023-04-23 NOTE — Progress Notes (Signed)
Mobility Specialist - Progress Note   04/23/23 1200  Mobility  Activity Refused mobility     Pt sleeping on arrival, utilizing RA. Pt awakened to voice and politely declined mobility; reports being too tired. Will attempt another date/time.    Filiberto Pinks Mobility Specialist 04/23/23, 12:12 PM

## 2023-04-24 DIAGNOSIS — N401 Enlarged prostate with lower urinary tract symptoms: Secondary | ICD-10-CM | POA: Diagnosis not present

## 2023-04-24 DIAGNOSIS — N179 Acute kidney failure, unspecified: Secondary | ICD-10-CM | POA: Diagnosis not present

## 2023-04-24 DIAGNOSIS — E876 Hypokalemia: Secondary | ICD-10-CM | POA: Insufficient documentation

## 2023-04-24 DIAGNOSIS — R310 Gross hematuria: Secondary | ICD-10-CM | POA: Diagnosis not present

## 2023-04-24 DIAGNOSIS — N189 Chronic kidney disease, unspecified: Secondary | ICD-10-CM | POA: Diagnosis not present

## 2023-04-24 LAB — CBC
HCT: 29.1 % — ABNORMAL LOW (ref 39.0–52.0)
Hemoglobin: 9.2 g/dL — ABNORMAL LOW (ref 13.0–17.0)
MCH: 29.7 pg (ref 26.0–34.0)
MCHC: 31.6 g/dL (ref 30.0–36.0)
MCV: 93.9 fL (ref 80.0–100.0)
Platelets: 304 10*3/uL (ref 150–400)
RBC: 3.1 MIL/uL — ABNORMAL LOW (ref 4.22–5.81)
RDW: 14.5 % (ref 11.5–15.5)
WBC: 8.4 10*3/uL (ref 4.0–10.5)
nRBC: 0 % (ref 0.0–0.2)

## 2023-04-24 LAB — BASIC METABOLIC PANEL
Anion gap: 6 (ref 5–15)
BUN: 25 mg/dL — ABNORMAL HIGH (ref 8–23)
CO2: 23 mmol/L (ref 22–32)
Calcium: 7.7 mg/dL — ABNORMAL LOW (ref 8.9–10.3)
Chloride: 111 mmol/L (ref 98–111)
Creatinine, Ser: 1.24 mg/dL (ref 0.61–1.24)
GFR, Estimated: 55 mL/min — ABNORMAL LOW (ref >60)
Glucose, Bld: 83 mg/dL (ref 70–99)
Potassium: 3.4 mmol/L — ABNORMAL LOW (ref 3.5–5.1)
Sodium: 140 mmol/L (ref 135–145)

## 2023-04-24 LAB — IRON AND TIBC
Iron: 43 ug/dL — ABNORMAL LOW (ref 45–182)
Saturation Ratios: 31 % (ref 17.9–39.5)
TIBC: 137 ug/dL — ABNORMAL LOW (ref 250–450)
UIBC: 94 ug/dL

## 2023-04-24 LAB — MAGNESIUM: Magnesium: 2.1 mg/dL (ref 1.7–2.4)

## 2023-04-24 LAB — VITAMIN B12: Vitamin B-12: 1738 pg/mL — ABNORMAL HIGH (ref 180–914)

## 2023-04-24 MED ORDER — POTASSIUM CHLORIDE CRYS ER 20 MEQ PO TBCR
40.0000 meq | EXTENDED_RELEASE_TABLET | Freq: Once | ORAL | Status: AC
  Administered 2023-04-24: 40 meq via ORAL
  Filled 2023-04-24: qty 2

## 2023-04-24 NOTE — Progress Notes (Signed)
  Progress Note   Patient: Thomas [name] ZOX:[id number] DOB: Apr 25, 1931 DOA: 04/22/2023     2 DOS: the patient was seen and examined on 04/24/2023   Brief hospital course: Thomas [name] is a 89 y.o. Caucasian male with medical history significant for osteoarthritis, hypertension, dyslipidemia and GERD, who presented to the emergency room with acute onset of hematuria.  Patient recently was admitted with acute cholecystitis, antibiotics completed, he came back to the hospital had a cholecystostomy performed on 12/7.  Per daughter, patient has been confused and agitated since the procedure, he pulled out his Foley catheter is out in significant hematuria.  Also had a significant acute renal failure.  Patient was seen by urology, Foley catheter was placed again.  Patient was also given fluids. Urine culture came back no growth.   Principal Problem:   Sepsis due to gram-negative UTI (HCC) Active Problems:   Acute kidney injury superimposed on chronic kidney disease (HCC)   Dyslipidemia   GERD without esophagitis   Depression   BPH (benign prostatic hyperplasia)   History of acute cholecystitis   Cholecystostomy care [facility])   Urinary retention due to benign prostatic hyperplasia   Gross hematuria   Hypokalemia   Assessment and Plan: SIRS, sepsis ruled out. Gross hematuria secondary to trauma from pulling out Foley catheter. UTI ruled out. Patient had a significant hematuria due to trauma from pulling out Foley catheter.  Bladder has been flushed, hematuria essentially resolved. Urine culture came back with no growth, antibiotic discontinued.    Acute kidney injury superimposed on chronic kidney disease 3a(HCC) Hypernatremia. Metabolic acidosis. Worsening renal function secondary to obstruction, Foley catheter was placed again, renal function improving.  However, patient has mild hyponatremia on normal saline, will change to half-normal saline. Patient also has mild metabolic  acidosis. Renal function has normalized, discontinue IV fluids.   BPH (benign prostatic hyperplasia) with urinary retention - We will continue Flomax.   Depression - We will continue Zoloft.   GERD without esophagitis - We will continue PPI therapy.   Dyslipidemia - We will continue statin therapy.      Subjective:  Patient doing well, no complaint.  No hematuria.  Physical Exam: Vitals:   04/23/23 1432 04/23/23 1939 04/24/23 0336 04/24/23 0848  BP: 106/68 121/65 124/60 (!) 154/68  Pulse: 77 66 (!) 42 (!) 51  Resp: 18 16 18 19   Temp: 98 F (36.7 C) 98.2 F (36.8 C) 98.1 F (36.7 C) (!) 97.5 F (36.4 C)  TempSrc:    Oral  SpO2: 93% 96% 97% 97%   General exam: Appears calm and comfortable  Respiratory system: Clear to auscultation. Respiratory effort normal. Cardiovascular system: S1 & S2 heard, RRR. No JVD, murmurs, rubs, gallops or clicks. No pedal edema. Gastrointestinal system: Abdomen is nondistended, soft and nontender. No organomegaly or masses felt. Normal bowel sounds heard. Central nervous system: Alert and oriented x2. No focal neurological deficits. Extremities: Symmetric 5 x 5 power. Skin: No rashes, lesions or ulcers Psychiatry:  Mood & affect appropriate.    Data Reviewed:  Lab results reviewed.  Family Communication: None  Disposition: Status is: Inpatient Remains inpatient appropriate because: Unsafe discharge. Discussed with TOC, patient will need insurance preauthorization before transferred to nursing home.     Time spent: 35 minutes  Author: Marrion Coy, MD 04/24/2023 9:57 AM  For on call review [email or web address].

## 2023-04-24 NOTE — NC FL2 (Signed)
[place] MEDICAID FL2 LEVEL OF CARE FORM     IDENTIFICATION  Patient Name: Thomas [name] Birthdate: 01-01-1931 Sex: male Admission Date (Current Location): 04/22/2023  [facility] and [place] Number:  Chiropodist and Address:  [facility], [address], [place], [place] [address]      Provider Number: [id number]  Attending Physician Name and Address:  Marrion Coy, MD  Relative Name and Phone Number:  Serena Croissant (Daughter)  [phone] [facility])    Current Level of Care: Hospital Recommended Level of Care: Skilled Nursing Facility Prior Approval Number:    Date Approved/Denied:   PASRR Number: [id number]  Discharge Plan:      Current Diagnoses: Patient Active Problem List   Diagnosis Date Noted   Hypokalemia 04/24/2023   Urinary retention due to benign prostatic hyperplasia 04/23/2023   Gross hematuria 04/23/2023   Sepsis due to gram-negative UTI (HCC) 04/22/2023   Acute kidney injury superimposed on chronic kidney disease (HCC) 04/22/2023   Dyslipidemia 04/22/2023   GERD without esophagitis 04/22/2023   Depression 04/22/2023   BPH (benign prostatic hyperplasia) 04/22/2023   History of acute cholecystitis 04/22/2023   Cholecystostomy care (HCC) 04/22/2023   Acute cholecystitis 04/13/2023   Acute CVA (cerebrovascular accident) [facility]) 02/13/2023   Rhabdomyolysis 02/12/2023   Hypophosphatemia 05/22/2021   Traumatic rhabdomyolysis (HCC)    Neuropathy    Rhabdomyolysis due to COVID-19 05/19/2021   Generalized weakness    Acute respiratory failure with hypoxia (HCC)    Insomnia    Acute metabolic encephalopathy    COVID-19 virus infection 05/28/2020   GERD (gastroesophageal reflux disease)    Hypertension    HLD (hyperlipidemia)    Multiple lung nodules 12/05/2018   Mass of right side of neck 01/27/2018    Orientation RESPIRATION BLADDER Height & Weight     Self, Place  Normal Incontinent, Indwelling catheter Weight:    Height:     BEHAVIORAL SYMPTOMS/MOOD NEUROLOGICAL BOWEL NUTRITION STATUS      Incontinent Diet (dys 1)  AMBULATORY STATUS COMMUNICATION OF NEEDS Skin   Extensive Assist Verbally Skin abrasions, Bruising, Other (Comment) (blood blister penis)                       Personal Care Assistance Level of Assistance  Bathing, Feeding, Dressing Bathing Assistance: Maximum assistance Feeding assistance: Maximum assistance Dressing Assistance: Maximum assistance     Functional Limitations Info             SPECIAL CARE FACTORS FREQUENCY  PT (By licensed PT), OT (By licensed OT)     PT Frequency: 5 times per week OT Frequency: 5 times per week            Contractures      Additional Factors Info  Code Status, Allergies Code Status Info: Limited: Do not attempt resuscitation (DNR) -DNR-LIMITED -Do Not Intubate/DNI             Current Medications (04/24/2023):  This is the current hospital active medication list Current Facility-Administered Medications  Medication Dose Route Frequency Provider Last Rate Last Admin   acetaminophen (TYLENOL) tablet 650 mg  650 mg Oral Q6H PRN Mansy, Jan A, MD       Or   acetaminophen (TYLENOL) suppository 650 mg  650 mg Rectal Q6H PRN Mansy, Jan A, MD       atorvastatin (LIPITOR) tablet 40 mg  40 mg Oral Daily Mansy, Jan A, MD   40 mg at 04/24/23 [phone]  bisacodyl (DULCOLAX) suppository 10 mg  10 mg Rectal Daily PRN Mansy, Jan A, MD       Chlorhexidine Gluconate Cloth 2 % PADS 6 each  6 each Topical Q0600 Marrion Coy, MD   6 each at 04/24/23 8413   cyanocobalamin (VITAMIN B12) tablet 1,000 mcg  1,000 mcg Oral Daily Mansy, Jan A, MD   1,000 mcg at 04/24/23 0819   magnesium hydroxide (MILK OF MAGNESIA) suspension 30 mL  30 mL Oral Daily PRN Mansy, Jan A, MD       ondansetron [facility]) tablet 4 mg  4 mg Oral Q6H PRN Mansy, Jan A, MD       Or   ondansetron [facility]) injection 4 mg  4 mg Intravenous Q6H PRN Mansy, Jan A, MD       pantoprazole  (PROTONIX) EC tablet 40 mg  40 mg Oral Daily Mansy, Jan A, MD   40 mg at 04/24/23 2440   polyethylene glycol (MIRALAX / GLYCOLAX) packet 17 g  17 g Oral BID Mansy, Jan A, MD   17 g at 04/24/23 1027   polyvinyl alcohol (LIQUIFILM TEARS) 1.4 % ophthalmic solution 2 drop  2 drop Both Eyes PRN Mansy, Jan A, MD       sertraline (ZOLOFT) tablet 25 mg  25 mg Oral Daily Mansy, Jan A, MD   25 mg at 04/24/23 2536   tamsulosin (FLOMAX) capsule 0.4 mg  0.4 mg Oral QPC supper Mansy, Jan A, MD   0.4 mg at 04/23/23 1654   traZODone (DESYREL) tablet 25 mg  25 mg Oral QHS PRN Mansy, Vernetta Honey, MD         Discharge Medications: Please see discharge summary for a list of discharge medications.  Relevant Imaging Results:  Relevant Lab Results:   Additional Information SS #: [id number]  Markevious Ehmke E Dustin Burrill, LCSW

## 2023-04-24 NOTE — TOC Progression Note (Signed)
Transition of Care [facility]) - Progression Note    Patient Details  Name: Thomas [name] MRN: [id number] Date of Birth: [birth year]  Transition of [facility]) CM/SW Contact  Liliana Cline, LCSW Phone Number: 04/24/2023, 3:16 PM  Clinical Narrative:    Patient is medically ready for SNF per MD. [facility] Admissions Phone for [facility], left a VM requesting they start insurance authorization.   Expected Discharge Plan: Skilled Nursing Facility Barriers to Discharge: Continued Medical Work up  Expected Discharge Plan and Services     Post Acute Care Choice: Skilled Nursing Facility Living arrangements for the past 2 months: Single Family Home                                       Social Determinants of Health (SDOH) Interventions SDOH Screenings   Food Insecurity: Patient Unable To Answer (04/23/2023)  Housing: Patient Unable To Answer (04/23/2023)  Transportation Needs: Patient Unable To Answer (04/23/2023)  Utilities: Patient Unable To Answer (04/23/2023)  Financial Resource Strain: Low Risk  (10/12/2022)   Received from [facility]  Tobacco Use: Low Risk  (04/21/2023)  Health Literacy: High Risk (03/12/2023)   Received from [facility]    Readmission Risk Interventions     No data to display

## 2023-04-25 DIAGNOSIS — R310 Gross hematuria: Secondary | ICD-10-CM | POA: Diagnosis not present

## 2023-04-25 DIAGNOSIS — N179 Acute kidney failure, unspecified: Secondary | ICD-10-CM | POA: Diagnosis not present

## 2023-04-25 DIAGNOSIS — N189 Chronic kidney disease, unspecified: Secondary | ICD-10-CM | POA: Diagnosis not present

## 2023-04-25 DIAGNOSIS — N401 Enlarged prostate with lower urinary tract symptoms: Secondary | ICD-10-CM | POA: Diagnosis not present

## 2023-04-25 LAB — BASIC METABOLIC PANEL
Anion gap: 8 (ref 5–15)
BUN: 23 mg/dL (ref 8–23)
CO2: 22 mmol/L (ref 22–32)
Calcium: 8.2 mg/dL — ABNORMAL LOW (ref 8.9–10.3)
Chloride: 111 mmol/L (ref 98–111)
Creatinine, Ser: 1.06 mg/dL (ref 0.61–1.24)
GFR, Estimated: >60 mL/min (ref >60)
Glucose, Bld: 92 mg/dL (ref 70–99)
Potassium: 4.2 mmol/L (ref 3.5–5.1)
Sodium: 141 mmol/L (ref 135–145)

## 2023-04-25 LAB — CBC
HCT: 31.9 % — ABNORMAL LOW (ref 39.0–52.0)
Hemoglobin: 9.9 g/dL — ABNORMAL LOW (ref 13.0–17.0)
MCH: 29.2 pg (ref 26.0–34.0)
MCHC: 31 g/dL (ref 30.0–36.0)
MCV: 94.1 fL (ref 80.0–100.0)
Platelets: 338 10*3/uL (ref 150–400)
RBC: 3.39 MIL/uL — ABNORMAL LOW (ref 4.22–5.81)
RDW: 13.9 % (ref 11.5–15.5)
WBC: 8.7 10*3/uL (ref 4.0–10.5)
nRBC: 0 % (ref 0.0–0.2)

## 2023-04-25 MED ORDER — LACTULOSE 10 GM/15ML PO SOLN
20.0000 g | Freq: Once | ORAL | Status: AC
  Administered 2023-04-25: 20 g via ORAL
  Filled 2023-04-25: qty 30

## 2023-04-25 NOTE — Progress Notes (Signed)
  Progress Note   Patient: Thomas [name] WUJ:[id number] DOB: May 31, 1930 DOA: 04/22/2023     3 DOS: the patient was seen and examined on 04/25/2023   Brief hospital course: IDIRIS CAPANO is a 89 y.o. Caucasian male with medical history significant for osteoarthritis, hypertension, dyslipidemia and GERD, who presented to the emergency room with acute onset of hematuria.  Patient recently was admitted with acute cholecystitis, antibiotics completed, he came back to the hospital had a cholecystostomy performed on 12/7.  Per daughter, patient has been confused and agitated since the procedure, he pulled out his Foley catheter is out in significant hematuria.  Also had a significant acute renal failure.  Patient was seen by urology, Foley catheter was placed again.  Patient was also given fluids. Urine culture came back no growth.   Principal Problem:   Sepsis due to gram-negative UTI (HCC) Active Problems:   Acute kidney injury superimposed on chronic kidney disease (HCC)   Dyslipidemia   GERD without esophagitis   Depression   BPH (benign prostatic hyperplasia)   History of acute cholecystitis   Cholecystostomy care [facility])   Urinary retention due to benign prostatic hyperplasia   Gross hematuria   Hypokalemia   Assessment and Plan:  SIRS, sepsis ruled out. Gross hematuria secondary to trauma from pulling out Foley catheter. UTI ruled out. Patient had a significant hematuria due to trauma from pulling out Foley catheter.  Bladder has been flushed, hematuria essentially resolved. Urine culture came back with no growth, antibiotic discontinued.   Anemia of chronic disease. Does not have B12 or iron deficiency.  Hemoglobin stable.   Acute kidney injury superimposed on chronic kidney disease 3a(HCC) Hypernatremia. Metabolic acidosis. Worsening renal function secondary to obstruction, Foley catheter was placed again, renal function improving.  However, patient has mild hyponatremia on  normal saline, was given half-normal saline. Patient also has mild metabolic acidosis. Renal function has normalized, discontinued IV fluids 12/14   BPH (benign prostatic hyperplasia) with urinary retention - We will continue Flomax.   Depression - We will continue Zoloft.   GERD without esophagitis - We will continue PPI therapy.   Dyslipidemia - We will continue statin therapy.     Subjective:  Patient doing well today, he has no complaint.  Physical Exam: Vitals:   04/24/23 1841 04/24/23 2058 04/25/23 0346 04/25/23 0927  BP: 121/60 134/70 (!) 150/75 (!) 154/98  Pulse: (!) 58 (!) 56 (!) 56 (!) 59  Resp: 20 (!) 24 20 18   Temp: 98.2 F (36.8 C) 98 F (36.7 C) (!) 97.3 F (36.3 C) 97.9 F (36.6 C)  TempSrc: Oral Oral Oral   SpO2: 98% 99% 99% 98%   General exam: Appears calm and comfortable  Respiratory system: Clear to auscultation. Respiratory effort normal. Cardiovascular system: S1 & S2 heard, RRR. No JVD, murmurs, rubs, gallops or clicks. No pedal edema. Gastrointestinal system: Abdomen is nondistended, soft and nontender. No organomegaly or masses felt. Normal bowel sounds heard. Central nervous system: Alert and oriented x2. No focal neurological deficits. Extremities: Symmetric 5 x 5 power. Skin: No rashes, lesions or ulcers Psychiatry: Judgement and insight appear normal. Mood & affect appropriate.    Data Reviewed:  Lab results reviewed.  Family Communication: None  Disposition: Status is: Inpatient Remains inpatient appropriate because: Unsafe discharge.     Time spent: 35 minutes  Author: Marrion Coy, MD 04/25/2023 12:58 PM  For on call review [email or web address].

## 2023-04-25 NOTE — Plan of Care (Signed)
  Problem: Education: Goal: Knowledge of discharge needs will improve Outcome: Progressing   Problem: Skin Integrity: Goal: Demonstration of wound healing without infection will improve Outcome: Progressing   Problem: Activity: Goal: Risk for activity intolerance will decrease Outcome: Progressing   Problem: Nutrition: Goal: Adequate nutrition will be maintained Outcome: Progressing

## 2023-04-26 DIAGNOSIS — N179 Acute kidney failure, unspecified: Secondary | ICD-10-CM | POA: Diagnosis not present

## 2023-04-26 DIAGNOSIS — N401 Enlarged prostate with lower urinary tract symptoms: Secondary | ICD-10-CM | POA: Diagnosis not present

## 2023-04-26 DIAGNOSIS — N189 Chronic kidney disease, unspecified: Secondary | ICD-10-CM | POA: Diagnosis not present

## 2023-04-26 DIAGNOSIS — R310 Gross hematuria: Secondary | ICD-10-CM | POA: Diagnosis not present

## 2023-04-26 LAB — BASIC METABOLIC PANEL
Anion gap: 6 (ref 5–15)
BUN: 21 mg/dL (ref 8–23)
CO2: 24 mmol/L (ref 22–32)
Calcium: 8.3 mg/dL — ABNORMAL LOW (ref 8.9–10.3)
Chloride: 111 mmol/L (ref 98–111)
Creatinine, Ser: 0.99 mg/dL (ref 0.61–1.24)
GFR, Estimated: >60 mL/min (ref >60)
Glucose, Bld: 86 mg/dL (ref 70–99)
Potassium: 4.1 mmol/L (ref 3.5–5.1)
Sodium: 141 mmol/L (ref 135–145)

## 2023-04-26 LAB — MRSA NEXT GEN BY PCR, NASAL: MRSA by PCR Next Gen: NOT DETECTED

## 2023-04-26 MED ORDER — ENSURE ENLIVE PO LIQD
237.0000 mL | Freq: Three times a day (TID) | ORAL | Status: DC
  Administered 2023-04-26 – 2023-04-27 (×4): 237 mL via ORAL

## 2023-04-26 NOTE — Plan of Care (Signed)
  Problem: Education: Goal: Knowledge of discharge needs will improve Outcome: Progressing   Problem: Clinical Measurements: Goal: Postoperative complications will be avoided or minimized Outcome: Progressing   Problem: Respiratory: Goal: Ability to achieve and maintain a regular respiratory rate will improve Outcome: Progressing   Problem: Skin Integrity: Goal: Demonstration of wound healing without infection will improve Outcome: Progressing   

## 2023-04-26 NOTE — Progress Notes (Signed)
Progress Note   Patient: Thomas [name] ZOX:[id number] DOB: 1930-10-25 DOA: 04/22/2023     89 DOS: the patient was seen and examined on 04/26/2023   Brief hospital course: JONHATAN GILKISON is a 89 y.o. Caucasian male with medical history significant for osteoarthritis, hypertension, dyslipidemia and GERD, who presented to the emergency room with acute onset of hematuria.  Patient recently was admitted with acute cholecystitis, antibiotics completed, he came back to the hospital had a cholecystostomy performed on 12/7.  Per daughter, patient has been confused and agitated since the procedure, he pulled out his Foley catheter is out in significant hematuria.  Also had a significant acute renal failure.  Patient was seen by urology, Foley catheter was placed again.  Patient was also given fluids. Urine culture came back no growth.   Principal Problem:   Sepsis due to gram-negative UTI (HCC) Active Problems:   Acute kidney injury superimposed on chronic kidney disease (HCC)   Dyslipidemia   GERD without esophagitis   Depression   BPH (benign prostatic hyperplasia)   History of acute cholecystitis   Cholecystostomy care [facility])   Urinary retention due to benign prostatic hyperplasia   Gross hematuria   Hypokalemia   Assessment and Plan: SIRS present,  sepsis ruled out. Gross hematuria secondary to trauma from pulling out Foley catheter. UTI ruled out. Patient had a significant hematuria due to trauma from pulling out Foley catheter.  Bladder has been flushed, hematuria essentially resolved. Urine culture came back with no growth, antibiotic discontinued.   Anemia of chronic disease. Does not have B12 or iron deficiency.  Hemoglobin stable.   Acute kidney injury superimposed on chronic kidney disease 3a(HCC) Hypernatremia. Metabolic acidosis. Worsening renal function secondary to obstruction, Foley catheter was placed again, renal function improving.  However, patient has mild hyponatremia  on normal saline, was given half-normal saline. Patient also has mild metabolic acidosis. Renal function has normalized, discontinued IV fluids 12/14   BPH (benign prostatic hyperplasia) with urinary retention - We will continue Flomax.   Depression - We will continue Zoloft.   GERD without esophagitis - We will continue PPI therapy.   Dyslipidemia - We will continue statin therapy.    Condition has stabilized, currently pending nursing home placement.    Subjective:  Patient has no complaint today.  Physical Exam: Vitals:   04/25/23 1737 04/25/23 2059 04/26/23 0441 04/26/23 0907  BP: (!) 150/74 110/66 (!) 151/80 (!) 142/74  Pulse: (!) 58 62 60 62  Resp: 18 20 16 16   Temp: 97.9 F (36.6 C) 98.1 F (36.7 C) (!) 97.5 F (36.4 C) 97.9 F (36.6 C)  TempSrc:  Oral Oral   SpO2: 98% 97% 97%   Weight:      Height:       General exam: Appears calm and comfortable  Respiratory system: Clear to auscultation. Respiratory effort normal. Cardiovascular system: S1 & S2 heard, RRR. No JVD, murmurs, rubs, gallops or clicks. No pedal edema. Gastrointestinal system: Abdomen is nondistended, soft and nontender. No organomegaly or masses felt. Normal bowel sounds heard. Central nervous system: Alert and oriented x2. No focal neurological deficits. Extremities: Symmetric 5 x 5 power. Skin: No rashes, lesions or ulcers Psychiatry: Mood & affect appropriate.    Data Reviewed:  Lab results reviewed.  Family Communication: None  Disposition: Status is: Inpatient Remains inpatient appropriate because: Unsafe discharge.     Time spent: 35 minutes  Author: Marrion Coy, MD 04/26/2023 10:23 AM  For on call review [email or web address].

## 2023-04-26 NOTE — TOC Progression Note (Signed)
Transition of Care [facility]) - Progression Note    Patient Details  Name: DALE ZBOROWSKI MRN: [id number] Date of Birth: [birth year]  Transition [facility] CM/SW Contact  Chapman Fitch, RN Phone Number: 04/26/2023, 9:21 AM  Clinical Narrative:     Sherron Monday with Dorathy Daft at [facility].   Berkley Harvey has not been started.  Auth to be started today.   Kayla to notify TOC when Berkley Harvey has been started   Expected Discharge Plan: Skilled Nursing Facility Barriers to Discharge: Continued Medical Work up  Expected Discharge Plan and Services     Post Acute Care Choice: Skilled Nursing Facility Living arrangements for the past 2 months: Single Family Home                                       Social Determinants of Health (SDOH) Interventions SDOH Screenings   Food Insecurity: Patient Unable To Answer (04/23/2023)  Housing: Patient Unable To Answer (04/23/2023)  Transportation Needs: Patient Unable To Answer (04/23/2023)  Utilities: Patient Unable To Answer (04/23/2023)  Financial Resource Strain: Low Risk  (10/12/2022)   Received from [facility]  Tobacco Use: Low Risk  (04/21/2023)  Health Literacy: High Risk (03/12/2023)   Received from [facility]    Readmission Risk Interventions     No data to display

## 2023-04-26 NOTE — Progress Notes (Signed)
Physical Therapy Treatment Patient Details Name: Thomas [name] MRN: [id number] DOB: 10/20/1930 Today's Date: 04/26/2023   History of Present Illness Thomas [name] is a 89 y.o. Caucasian male with medical history significant for osteoarthritis, hypertension, dyslipidemia and GERD, who presented to the emergency room with acute onset of hematuria.  The patient apparently pulled his Foley catheter at his SNF and it has been not been draining.  He has been having nausea and vomiting as well as diminished appetite over the last week.  He was recently discharged from here after being managed for acute CVA and acute cholecystitis status post cholecystostomy tube.  He denied any fever or chills.  No chest pain or palpitations.  No cough or wheezing or dyspnea.    PT Comments  Pt seen for PT tx with pt agreeable. Pt reporting he cannot do therapy 2/2 "I'm too weak" with PT educating him that's the purpose of PT. Pt requires max assist for supine>sit, Max assist +2 for STS with use of steady. Pt able to somewhat assist with powering up to standing but decreased ability to shift pelvis anteriorly to come to full upright standing. Pt performs BLE strengthening exercises & reaching once sitting in recliner. Will continue to follow pt acutely to progress mobility as able.    If plan is discharge home, recommend the following: Two people to help with walking and/or transfers;Two people to help with bathing/dressing/bathroom;Assistance with cooking/housework;Assistance with feeding;Direct supervision/assist for medications management;Assist for transportation;Supervision due to cognitive status;Help with stairs or ramp for entrance   Can travel by private vehicle     No  Equipment Recommendations  Other (comment) (defer to next venue)    Recommendations for Other Services Rehab consult     Precautions / Restrictions Precautions Precautions: Fall Precaution Comments: R side drain; foley Restrictions Weight  Bearing Restrictions Per Provider Order: No     Mobility  Bed Mobility Overal bed mobility: Needs Assistance Bed Mobility: Supine to Sit     Supine to sit: Max assist, HOB elevated, Used rails (Pt able to move BLE to EOB but requires assistance to fully do so, assistance to shift hips to EOB, upright trunk as pt with poor ability to push with BUE to upright self.)          Transfers Overall transfer level: Needs assistance Equipment used: Ambulation equipment used Transfers: Sit to/from Stand, Bed to chair/wheelchair/BSC Sit to Stand: Max assist, +2 safety/equipment, +2 physical assistance, Via Scientist, product/process development via Lift Equipment: Stedy  Ambulation/Gait                   Stairs             Wheelchair Mobility     Tilt Bed    Modified Rankin (Stroke Patients Only)       Balance Overall balance assessment: Needs assistance Sitting-balance support: Feet supported, Bilateral upper extremity supported Sitting balance-[name]: Poor Sitting balance - Comments: R lateral lean with decreased ability to correct to midline orientation, max assist to do so as well as to correct posterior lean Postural control: Posterior lean, Right lateral lean                                  Cognition Arousal: Alert Behavior During Therapy: WFL for tasks assessed/performed Overall Cognitive Status: History of cognitive impairments -  at baseline Area of Impairment: Orientation, Memory, Awareness, Safety/judgement, Following commands, Problem solving                     Memory: Decreased short-term memory Following Commands: Follows one step commands with increased time, Follows one step commands inconsistently Safety/Judgement: Decreased awareness of safety, Decreased awareness of deficits Awareness: Emergent Problem Solving: Slow processing, Decreased initiation, Difficulty sequencing, Requires verbal cues, Requires tactile  cues General Comments: Pt moaning & groaning throughout session, decreased attention to PT, decreased ability to follow simple commands        Exercises General Exercises - Lower Extremity Long Arc Quad: AROM, Seated, Strengthening, Both, 20 reps Other Exercises Other Exercises: Reaching with BUE for targets with focus on core strengthening & anterior weight shifting; pt with decreased shoulder flexion noted in LUE.    General Comments        Pertinent Vitals/Pain Pain Assessment Pain Assessment: Faces Faces Pain Scale: Hurts a little bit Pain Location: generalized discomfort Pain Descriptors / Indicators: Discomfort Pain Intervention(s): Monitored during session, Repositioned    Home Living                          Prior Function            PT Goals (current goals can now be found in the care plan section) Acute Rehab PT Goals Patient Stated Goal: to go back to rehab. PT Goal Formulation: With patient Time For Goal Achievement: 05/07/23 Potential to Achieve Goals: Fair Progress towards PT goals: Progressing toward goals    Frequency    Min 1X/week      PT Plan      Co-evaluation              AM-PAC PT "6 Clicks" Mobility   Outcome Measure  Help needed turning from your back to your side while in a flat bed without using bedrails?: A Lot Help needed moving from lying on your back to sitting on the side of a flat bed without using bedrails?: Total Help needed moving to and from a bed to a chair (including a wheelchair)?: Total Help needed standing up from a chair using your arms (e.g., wheelchair or bedside chair)?: Total Help needed to walk in hospital room?: Total Help needed climbing 3-5 steps with a railing? : Total 6 Click Score: 7    End of Session   Activity Tolerance: Patient tolerated treatment well;Patient limited by fatigue Patient left: in chair;with call bell/phone within reach;with nursing/sitter in room Nurse  Communication: Mobility status PT Visit Diagnosis: Unsteadiness on feet (R26.81);Other abnormalities of gait and mobility (R26.89);Repeated falls (R29.6);Muscle weakness (generalized) (M62.81);Hemiplegia and hemiparesis;Difficulty in walking, not elsewhere classified (R26.2) Hemiplegia - Right/Left: Left Hemiplegia - dominant/non-dominant: Non-dominant Hemiplegia - caused by: Cerebral infarction     Time: 1610-9604 PT Time Calculation (min) (ACUTE ONLY): 17 min  Charges:    $Therapeutic Activity: 8-22 mins PT General Charges $$ ACUTE PT VISIT: 1 Visit                     Aleda Grana, PT, DPT 04/26/23, 4:11 PM   Sandi Mariscal 04/26/2023, 4:10 PM

## 2023-04-27 DIAGNOSIS — N401 Enlarged prostate with lower urinary tract symptoms: Secondary | ICD-10-CM | POA: Diagnosis not present

## 2023-04-27 DIAGNOSIS — N179 Acute kidney failure, unspecified: Secondary | ICD-10-CM | POA: Diagnosis not present

## 2023-04-27 DIAGNOSIS — N189 Chronic kidney disease, unspecified: Secondary | ICD-10-CM | POA: Diagnosis not present

## 2023-04-27 DIAGNOSIS — R310 Gross hematuria: Secondary | ICD-10-CM | POA: Diagnosis not present

## 2023-04-27 LAB — CULTURE, BLOOD (ROUTINE X 2): Culture: NO GROWTH

## 2023-04-27 NOTE — Discharge Summary (Signed)
Physician Discharge Summary   Patient: Thomas [name] MRN: [id number] DOB: 08-Dec-1930  Admit date:     04/22/2023  Discharge date: 04/27/23  Discharge Physician: Marrion Coy   PCP: Barbette Reichmann, MD   Recommendations at discharge:   Follow-up with PCP in 1 week.  Discharge Diagnoses: Principal Problem:   Sepsis due to gram-negative UTI (HCC) Active Problems:   Acute kidney injury superimposed on chronic kidney disease (HCC)   Dyslipidemia   GERD without esophagitis   Depression   BPH (benign prostatic hyperplasia)   History of acute cholecystitis   Cholecystostomy care [facility])   Urinary retention due to benign prostatic hyperplasia   Gross hematuria   Hypokalemia  Resolved Problems:   * No resolved hospital problems. *  Hospital Course: Thomas [name] is a 89 y.o. Caucasian male with medical history significant for osteoarthritis, hypertension, dyslipidemia and GERD, who presented to the emergency room with acute onset of hematuria.  Patient recently was admitted with acute cholecystitis, antibiotics completed, he came back to the hospital had a cholecystostomy performed on 12/7.  Per daughter, patient has been confused and agitated since the procedure, he pulled out his Foley catheter is out in significant hematuria.  Also had a significant acute renal failure.  Patient was seen by urology, Foley catheter was placed again.  Patient was also given fluids. Urine culture came back no growth.  Assessment and Plan: SIRS present,  sepsis ruled out. Gross hematuria secondary to trauma from pulling out Foley catheter. UTI ruled out. Patient had a significant hematuria due to trauma from pulling out Foley catheter.  Bladder has been flushed, hematuria essentially resolved. Urine culture came back with no growth, antibiotic discontinued.   Anemia of chronic disease. Does not have B12 or iron deficiency.  Hemoglobin stable.   Acute kidney injury superimposed on chronic kidney  disease 3a(HCC) Hypernatremia. Metabolic acidosis. Worsening renal function secondary to obstruction, Foley catheter was placed again, renal function improving.  However, patient has mild hyponatremia on normal saline, was given half-normal saline. Patient also has mild metabolic acidosis. Renal function has normalized, discontinued IV fluids 12/14   BPH (benign prostatic hyperplasia) with urinary retention - We will continue Flomax.   Depression - We will continue Zoloft.   GERD without esophagitis - We will continue PPI therapy.   Dyslipidemia - We will continue statin therapy.       Consultants: None Procedures performed: None  Disposition: Skilled nursing facility Diet recommendation:  Discharge Diet Orders (From admission, onward)     Start     Ordered   04/27/23 0000  Diet general       Comments: Dys 1 diet   04/27/23 1438           Dysphagia type 1 thin Liquid DISCHARGE MEDICATION: Allergies as of 04/27/2023   No Known Allergies      Medication List     STOP taking these medications    amoxicillin-clavulanate 875-125 MG tablet Commonly known as: AUGMENTIN   clopidogrel 75 MG tablet Commonly known as: PLAVIX       TAKE these medications    acetaminophen 325 MG tablet Commonly known as: TYLENOL Take 2 tablets (650 mg total) by mouth every 6 (six) hours as needed for fever, headache or moderate pain.   aspirin 81 MG chewable tablet Chew 1 tablet (81 mg total) by mouth daily.   atorvastatin 40 MG tablet Commonly known as: LIPITOR Take 1 tablet (40 mg total) by mouth daily.  bisacodyl 5 MG EC tablet Commonly known as: DULCOLAX Take 2 tablets (10 mg total) by mouth at bedtime.   bisacodyl 10 MG suppository Commonly known as: DULCOLAX Place 1 suppository (10 mg total) rectally daily as needed for severe constipation.   cyanocobalamin 1000 MCG tablet Commonly known as: VITAMIN B12 Take 1,000 mcg by mouth daily.   pantoprazole 40 MG  tablet Commonly known as: PROTONIX Take 1 tablet (40 mg total) by mouth daily.   polyethylene glycol 17 g packet Commonly known as: MIRALAX / GLYCOLAX Take 17 g by mouth 2 (two) times daily.   polyvinyl alcohol 1.4 % ophthalmic solution Commonly known as: LIQUIFILM TEARS Place 2 drops into both eyes as needed for dry eyes.   sertraline 25 MG tablet Commonly known as: ZOLOFT Take 1 tablet by mouth daily.   tamsulosin 0.4 MG Caps capsule Commonly known as: FLOMAX Take 1 capsule (0.4 mg total) by mouth daily after supper.               Discharge Care Instructions  (From admission, onward)           Start     Ordered   04/27/23 0000  Discharge wound care:       Comments: Follow with RN   04/27/23 1438            Follow-up Information     Barbette Reichmann, MD Follow up in 1 week(s).   Specialty: Internal Medicine Contact information: [address] [place] [address] [phone]                Discharge Exam: Ceasar Mons Weights   04/25/23 1727  Weight: 74.3 kg   General exam: Appears calm and comfortable  Respiratory system: Clear to auscultation. Respiratory effort normal. Cardiovascular system: S1 & S2 heard, RRR. No JVD, murmurs, rubs, gallops or clicks. No pedal edema. Gastrointestinal system: Abdomen is nondistended, soft and nontender. No organomegaly or masses felt. Normal bowel sounds heard. Central nervous system: Alert and oriented x2. No focal neurological deficits. Extremities: Symmetric 5 x 5 power. Skin: No rashes, lesions or ulcers Psychiatry:  Mood & affect appropriate.    Condition at discharge: good  The results of significant diagnostics from this hospitalization (including imaging, microbiology, ancillary and laboratory) are listed below for reference.   Imaging Studies: CT Renal Stone Study Result Date: 04/21/2023 CLINICAL DATA:  Hematuria, mild functioning Foley catheter EXAM: CT ABDOMEN  AND PELVIS WITHOUT CONTRAST TECHNIQUE: Multidetector CT imaging of the abdomen and pelvis was performed following the standard protocol without IV contrast. RADIATION DOSE REDUCTION: This exam was performed according to the departmental dose-optimization program which includes automated exposure control, adjustment of the mA and/or kV according to patient size and/or use of iterative reconstruction technique. COMPARISON:  04/16/2023 FINDINGS: Lower chest: Lung bases demonstrate mild scarring stable from the prior study. Hepatobiliary: Liver is within normal limits. Cholecystostomy catheter is noted in place. Gallbladder is decompressed. Pancreas: Unremarkable. No pancreatic ductal dilatation or surrounding inflammatory changes. Spleen: Normal in size without focal abnormality. Adrenals/Urinary Tract: Adrenal glands are within normal limits. Bilateral hydronephrosis and hydroureter is noted without evidence of obstructing stones. Some dependent density is noted within the bladder consistent with the given clinical history of hematuria. Air is noted within the bladder. The bladder is significantly over distended. A Foley catheter is noted in place although lies within the midportion of the penile urethra. The tip extends to the level of the inferior prostatic urethra.  The balloon should be deflated and the catheter advanced into the bladder to allow for optimum function. Stable simple cysts are noted within the kidneys bilaterally. No follow-up is recommended. Stomach/Bowel: No obstructive or inflammatory changes of the colon are seen. The appendix is not well visualized although no inflammatory changes are seen. Small bowel and stomach are within normal limits with the exception of a moderate to large hiatal hernia. Vascular/Lymphatic: Atherosclerotic calcifications of the abdominal aorta are noted. There is dilatation of the infrarenal aorta to 4.2 cm in greatest dimension. This is unchanged from the prior exam. No  lymphadenopathy is noted. Reproductive: Prostate is prominent indenting upon the inferior aspect of the bladder. Other: No abdominal wall hernia or abnormality. No abdominopelvic ascites. Musculoskeletal: No acute bony abnormality is noted. Chronic changes at the T12-L1 interspace are noted. IMPRESSION: The previously seen Foley catheter has been withdrawn and now lies in the midportion of the penile urethra. This should be deflated and advanced into the bladder to allow for optimum function. Abdominal aortic aneurysm measuring 4.2 cm. Recommend CTA or MRA, as appropriate, in 12 months and referral to vascular specialist as clinically necessary given the patient's advanced age. Reference: Journal of Vascular Surgery 67.1 (2018): 2-77. J Am Coll Radiol [phone]. Cholecystostomy tube is noted in satisfactory position. Bilateral hydronephrosis related to over distention of the urinary bladder. Electronically Signed   By: Alcide Clever M.D.   On: 04/21/2023 23:51   IR Perc Cholecystostomy Result Date: 04/17/2023 INDICATION: 89 year old male with acute calculus cholecystitis. He has failed conservative therapy and requires placement of a percutaneous cholecystostomy tube. He has had recent cerebrovascular accidents in his on antiplatelet therapy with both aspirin and Plavix. Therefore, he is at slightly increased risk for bleeding. We will pursue a trans peritoneal approach rather than transhepatic. EXAM: CHOLECYSTOSTOMY MEDICATIONS: In patient receiving intravenous Zosyn therapy. Last dose at 5:30 a.m. No additional prophylaxis given. ANESTHESIA/SEDATION: Moderate (conscious) sedation was employed during this procedure. A total of Versed 0.5 mg and Fentanyl 25 mcg was administered intravenously. Moderate Sedation Time: 11 minutes. The patient's level of consciousness and vital signs were monitored continuously by radiology nursing throughout the procedure under my direct supervision. FLUOROSCOPY TIME:   Radiation exposure index: 3 mGy reference air kerma COMPLICATIONS: None immediate. PROCEDURE: Informed written consent was obtained from the patient after a thorough discussion of the procedural risks, benefits and alternatives. All questions were addressed. Maximal Sterile Barrier Technique was utilized including caps, mask, sterile gowns, sterile gloves, sterile drape, hand hygiene and skin antiseptic. A timeout was performed prior to the initiation of the procedure. The right upper quadrant was interrogated with ultrasound. The distended gallbladder was successfully identified. A suitable skin entry site was selected and marked. Local anesthesia was attained by infiltration with 1% lidocaine. A small dermatotomy was made. Under real-time ultrasound guidance, a 21 gauge Accustick needle was advanced through the fundus of the gallbladder along a trans peritoneal approach. A 0.018 wire was then coiled in the gallbladder lumen. The needle was removed. The transitional dilator was advanced over the wire into the gallbladder fundus. A gentle injection of contrast material confirms opacification of the gallbladder lumen. The cystic duct is occluded. A 0.035 wire was coiled in the gallbladder. The transitional dilator was removed. The percutaneous tract was dilated to 12 [place]. A 12 [place] skater drain was advanced over the wire and formed. Aspiration yields frankly purulent bile. Images were obtained and stored for the medical record to confirm catheter tube placement.  The catheter was secured to the skin with 0 Prolene suture and connected to gravity bag drainage. IMPRESSION: Successful placement of transperitoneal percutaneous cholecystostomy tube for the indication of acute calculus cholecystitis. Electronically Signed   By: Malachy Moan M.D.   On: 04/17/2023 12:39   CT ABDOMEN PELVIS WO CONTRAST Result Date: 04/16/2023 CLINICAL DATA:  Abdominal pain.  Small bowel obstruction suspected. EXAM: CT ABDOMEN AND  PELVIS WITHOUT CONTRAST TECHNIQUE: Multidetector CT imaging of the abdomen and pelvis was performed following the standard protocol without IV contrast. RADIATION DOSE REDUCTION: This exam was performed according to the departmental dose-optimization program which includes automated exposure control, adjustment of the mA and/or kV according to patient size and/or use of iterative reconstruction technique. COMPARISON:  Abdominopelvic CT 04/11/2023 and 02/14/2023. FINDINGS: Lower chest: Stable mild chronic fibrotic changes at both lung bases. No superimposed airspace disease, pleural effusion or pneumothorax. The heart is mildly enlarged. There is aortic and coronary artery atherosclerosis with aortic valvular and mitral annular calcifications. There is a small to moderate hiatal hernia. Hepatobiliary: The liver appears unremarkable as imaged in the noncontrast state. Small gallstones again noted. There is increased gallbladder wall thickening and surrounding inflammation suspicious for cholecystitis. No evidence of biliary ductal dilatation. Pancreas: Mild atrophy. No ductal dilatation or surrounding inflammation. Spleen: Normal in size without focal abnormality. Adrenals/Urinary Tract: Both adrenal glands appear normal. Unchanged bilateral renal cysts, larger on the left, for which no specific follow-up imaging is recommended. There is no evidence of urinary tract calculus or hydronephrosis. Foley catheter remains in place without apparent bladder abnormality. Stomach/Bowel: No enteric contrast administered. As above, there is a stable small to moderate hiatal hernia. The stomach otherwise appears unremarkable for its degree of distention. No evidence of bowel distension, wall thickening or surrounding inflammation. Moderate stool throughout the colon. Nonvisualization of the appendix, as before. Vascular/Lymphatic: There are no enlarged abdominal or pelvic lymph nodes. Aortic and branch vessel atherosclerosis with  stable 4.2 cm infrarenal abdominal aortic aneurysm. Reproductive: Mild enlargement of the prostate gland. Other: Stable small umbilical hernia containing only fat. No ascites or pneumoperitoneum. Musculoskeletal: No acute or significant osseous findings. Multilevel spondylosis with chronic compression deformities. IMPRESSION: 1. Increased gallbladder wall thickening and surrounding inflammation suspicious for acute cholecystitis. Correlate clinically. 2. No evidence of bowel obstruction or other acute abdominopelvic findings. No other significant changes are seen. 3. Stable 4.2 cm infrarenal abdominal aortic aneurysm. Abdominal aortic aneurysm measuring 4.2 cm. Recommend CTA or MRA, as appropriate in this 89 year old, in 12 months and referral to vascular specialist. Reference: Journal of Vascular Surgery 67.1 (2018): 2-77. J Am Coll Radiol 2013;10:789-794. 4.  Aortic Atherosclerosis (ICD10-I70.0). Electronically Signed   By: Carey Bullocks M.D.   On: 04/16/2023 15:49   CT ABDOMEN PELVIS WO CONTRAST Result Date: 04/11/2023 CLINICAL DATA:  Bowel obstruction suspected. Nausea and vomiting. Constipation. EXAM: CT ABDOMEN AND PELVIS WITHOUT CONTRAST TECHNIQUE: Multidetector CT imaging of the abdomen and pelvis was performed following the standard protocol without IV contrast. RADIATION DOSE REDUCTION: This exam was performed according to the departmental dose-optimization program which includes automated exposure control, adjustment of the mA and/or kV according to patient size and/or use of iterative reconstruction technique. COMPARISON:  Abdominal radiographs 04/10/2023.  CT 02/14/2023 FINDINGS: Lower chest: Slight fibrosis in the lung bases. Cardiac enlargement. Moderate-sized esophageal hiatal hernia. Hepatobiliary: Cholelithiasis with gas-filled stones in the gallbladder. Mild gallbladder wall thickening and stranding may indicate acute cholecystitis in the appropriate clinical setting. No focal liver lesions.  Pancreas:  Unremarkable. No pancreatic ductal dilatation or surrounding inflammatory changes. Spleen: Normal in size without focal abnormality. Adrenals/Urinary Tract: No adrenal gland nodules. Bilateral renal cysts. Largest on the left is 7.4 cm in diameter. No change since prior study. No imaging follow-up is indicated. No hydronephrosis or hydroureter. Bladder is decompressed with a Foley catheter in place. Stomach/Bowel: Stomach, small bowel, and colon are not abnormally distended. Stool fills the colon. No wall thickening or inflammatory changes are appreciated. Appendix is not identified. Vascular/Lymphatic: Calcification of the aorta. Infrarenal abdominal aortic aneurysm measuring 4.2 cm AP dimension. No change since prior study. No significant lymphadenopathy. Reproductive: Prostate is unremarkable. Other: No abdominal wall hernia or abnormality. No abdominopelvic ascites. Musculoskeletal: Degenerative changes in the spine. No acute bony abnormalities. IMPRESSION: 1. Cholelithiasis with gallbladder wall thickening and mild stranding. Changes may represent acute cholecystitis in the appropriate clinical setting. 2. Abdominal aortic aneurysm measuring 4.2 cm. Recommend CTA or MRA, as appropriate, in 12 months and referral to vascular specialist. Reference: Journal of Vascular Surgery 67.1 (2018): 2-77. J Am Coll Radiol 2013;10:789-794. 3. Aortic atherosclerosis. 4. Slight fibrosis in the lung bases. Moderate esophageal hiatal hernia. Electronically Signed   By: Burman Nieves M.D.   On: 04/11/2023 19:50   CT HEAD CODE STROKE WO CONTRAST` Result Date: 04/11/2023 CLINICAL DATA:  Code stroke. Provided history: Mental status change, unknown cause. EXAM: CT HEAD WITHOUT CONTRAST TECHNIQUE: Contiguous axial images were obtained from the base of the skull through the vertex without intravenous contrast. RADIATION DOSE REDUCTION: This exam was performed according to the departmental dose-optimization program which  includes automated exposure control, adjustment of the mA and/or kV according to patient size and/or use of iterative reconstruction technique. COMPARISON:  Head CT 03/11/2023.  Brain MRI 03/11/2023. FINDINGS: Brain: Generalized cerebral atrophy. Prominence of the ventricles and sulci appears commensurate Advanced patchy and confluent hypoattenuation within the cerebral white matter, nonspecific but compatible chronic small vessel ischemic disease. 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 There is no acute intracranial hemorrhage. No demarcated cortical infarct. No extra-axial fluid collection. No evidence of an intracranial mass. No midline shift. Vascular: No hyperdense vessel.  Atherosclerotic calcifications. Skull: No calvarial fracture or aggressive osseous lesion. Sinuses/Orbits: No mass or acute finding within the imaged orbits. Mild bilateral ethmoid sinusitis. ASPECTS [facility] Stroke Program Early CT Score) - Ganglionic level infarction (caudate, lentiform nuclei, internal capsule, insula, M1-M3 cortex): 7 - Supraganglionic infarction (M4-M6 cortex): 3 Total score (0-10 with 10 being normal): 10 (when discounting chronic infarcts). No evidence of an acute intracranial abnormality. These results were communicated to Dr. Otelia Limes at 6:28 pmon 12/1/2024by text page via the [facility] messaging system. IMPRESSION: 1. No evidence of an acute intracranial abnormality. 2. Parenchymal atrophy with advanced chronic small vessel ischemic disease and multiple chronic infarcts as described. 3. Mild bilateral ethmoid sinusitis. Electronically Signed   By: Jackey Loge D.O.   On: 04/11/2023 18:29   DG Abd 1 View Result Date: 04/10/2023 CLINICAL DATA:  89 year old male with history of vomiting. EXAM: ABDOMEN - 1 VIEW COMPARISON:  Abdominal radiograph 03/21/2023. FINDINGS: The bowel gas pattern is normal. No radio-opaque calculi or other significant radiographic abnormality are  seen. IMPRESSION: Negative. Electronically Signed   By: Trudie Reed M.D.   On: 04/10/2023 06:24    Microbiology: Results for orders placed or performed during the hospital encounter of 04/22/23  Blood culture (routine x 2)     Status: None   Collection Time: 04/22/23  1:26  AM   Specimen: BLOOD  Result Value Ref Range Status   Specimen Description BLOOD LEFT FOREARM  Final   Special Requests   Final    BOTTLES DRAWN AEROBIC AND ANAEROBIC Blood Culture results may not be optimal due to an inadequate volume of blood received in culture bottles   Culture   Final    NO GROWTH 5 DAYS Performed at [facility], [address], [place], [place] [address]    Report Status 04/27/2023 FINAL  Final  Urine Culture     Status: None   Collection Time: 04/22/23  2:40 AM   Specimen: Urine, Random  Result Value Ref Range Status   Specimen Description   Final    URINE, RANDOM Performed at [facility], [address] [place], [place] [address]    Special Requests   Final    NONE Performed at [facility], [address] [place], [place] [address]    Culture   Final    NO GROWTH Performed at [facility], [address], [place], [place] [address]    Report Status 04/23/2023 FINAL  Final  MRSA Next Gen by PCR, Nasal     Status: None   Collection Time: 04/25/23 11:13 PM   Specimen: Nasal Mucosa; Nasal Swab  Result Value Ref Range Status   MRSA by PCR Next Gen NOT DETECTED NOT DETECTED Final    Comment: (NOTE) The GeneXpert MRSA Assay (FDA approved for NASAL specimens only), is one component of a comprehensive MRSA colonization surveillance program. It is not intended to diagnose MRSA infection nor to guide or monitor treatment for MRSA infections. Test performance is not FDA approved in patients less than 100 years old. Performed at [facility], [address] [place], [place] [address]     Labs: CBC: Recent Labs  Lab  04/21/23 1805 04/22/23 0425 04/22/23 1100 04/24/23 0653 04/25/23 0534  WBC 23.4* 19.5*  --  8.4 8.7  NEUTROABS 21.2*  --   --   --   --   HGB 12.6* 9.7* 11.4* 9.2* 9.9*  HCT 39.6 33.2* 36.7* 29.1* 31.9*  MCV 92.1 103.1*  --  93.9 94.1  PLT 407* 317  --  304 338   Basic Metabolic Panel: Recent Labs  Lab 04/22/23 0425 04/23/23 0531 04/24/23 0653 04/25/23 0534 04/26/23 0608  NA 137 147* 140 141 141  K 3.9 3.6 3.4* 4.2 4.1  CL 110 120* 111 111 111  CO2 14* 21* 23 22 24   GLUCOSE 104* 83 83 92 86  BUN 38* 33* 25* 23 21  CREATININE 2.91* 1.85* 1.24 1.06 0.99  CALCIUM 7.6* 8.0* 7.7* 8.2* 8.3*  MG  --   --  2.1  --   --    Liver Function Tests: Recent Labs  Lab 04/21/23 1805 04/23/23 0531  AST 34  --   ALT 24  --   ALKPHOS 86  --   BILITOT 0.6  --   PROT 6.9  --   ALBUMIN 2.7* 1.9*   CBG: No results for input(s): "GLUCAP" in the last 168 hours.  Discharge time spent: greater than 30 minutes.  Signed: Marrion Coy, MD [facility] 04/27/2023

## 2023-04-27 NOTE — Care Management Important Message (Signed)
Important Message  Patient Details  Name: Thomas [name] MRN: [id number] Date of Birth: 01-19-1931   Important Message Given:  Yes - Medicare IM     Verita Schneiders Jaquavis Felmlee 04/27/2023, 2:47 PM

## 2023-04-27 NOTE — Progress Notes (Signed)
Occupational Therapy Treatment Patient Details Name: ZVI FURSE MRN: [id number] DOB: 03-03-1931 Today's Date: 04/27/2023   History of present illness LANGSTON METTER is a 89 y.o. Caucasian male with medical history significant for osteoarthritis, hypertension, dyslipidemia and GERD, who presented to the emergency room with acute onset of hematuria.  The patient apparently pulled his Foley catheter at his SNF and it has been not been draining.  He has been having nausea and vomiting as well as diminished appetite over the last week.  He was recently discharged from here after being managed for acute CVA and acute cholecystitis status post cholecystostomy tube.  He denied any fever or chills.  No chest pain or palpitations.  No cough or wheezing or dyspnea.   OT comments  Pt received semi-reclined in bed. Appearing fatigued; willing to work with OT on sitting EOB to eat. T/f MAX A and unable to maintain sitting balance on EOB, so returned to semi-reclined in bed; placed in chair position and assisted with eating. See flowsheet below for further details of session. Left in chair position, visitor in room that arrived during session, RN in room, with all needs in reach.  Patient will benefit from continued OT while in acute care.       If plan is discharge home, recommend the following:  Two people to help with walking and/or transfers;Assistance with cooking/housework;Assist for transportation;Help with stairs or ramp for entrance;Supervision due to cognitive status;Direct supervision/assist for medications management;Direct supervision/assist for financial management;Two people to help with bathing/dressing/bathroom   Equipment Recommendations  Other (comment) (defer to next venue)    Recommendations for Other Services      Precautions / Restrictions Precautions Precautions: Fall Precaution Comments: R side drain; foley Restrictions Weight Bearing Restrictions Per Provider Order: No        Mobility Bed Mobility Overal bed mobility: Needs Assistance Bed Mobility: Supine to Sit, Sit to Supine     Supine to sit: Max assist, HOB elevated, Used rails Sit to supine: Max assist   General bed mobility comments: Pt with increased weakness today and strong posterior lean; unable to gain balance in sitting.    Transfers                   General transfer comment: Not safe to attempt standing, as pt unable to get his balance in sitting even with MAX A     Balance Overall balance assessment: Needs assistance Sitting-balance support: Feet supported, Bilateral upper extremity supported Sitting balance-[name]: Zero   Postural control: Posterior lean, Right lateral lean                                 ADL either performed or assessed with clinical judgement   ADL Overall ADL's : Needs assistance/impaired Eating/Feeding: Minimal assistance;Bed level Eating/Feeding Details (indicate cue type and reason): bed in chair position. Assit to grasp items initially; drank some tea and ate some magic cup and puree peaches. OT assist to angle cup towards pt. Pt coughing after iced tea; SLP notified. Pt appearing very fatigued.                                   General ADL Comments: Appears with increased weakness today; fatigued.    Extremity/Trunk Assessment Upper Extremity Assessment Upper Extremity Assessment: LUE deficits/detail LUE Deficits / Details: recent old  CVA with LUE weakness/inattention. Once asked to use LUE, pt is able to engage it with 4-/5 strength.   Lower Extremity Assessment Lower Extremity Assessment: Defer to PT evaluation        Vision       Perception     Praxis      Cognition Arousal: Alert Behavior During Therapy: [facility] for tasks assessed/performed Overall Cognitive Status: History of cognitive impairments - at baseline                                 General Comments: Oriented to self;  difficulty following commands. Making faces and moaning sounds, but then denies pain. Oriented to family member who walked in the room- called family member by name without prompting.        Exercises      Shoulder Instructions       General Comments On room air; appearing fatigued today; RN reporting pt slept all morning.    Pertinent Vitals/ Pain       Pain Assessment Pain Assessment: No/denies pain  Home Living                                          Prior Functioning/Environment              Frequency  Min 1X/week        Progress Toward Goals  OT Goals(current goals can now be found in the care plan section)  Progress towards OT goals: Not progressing toward goals - comment (increased fatigue today)  Acute Rehab OT Goals Patient Stated Goal: None stated OT Goal Formulation: With patient Time For Goal Achievement: 05/07/23 Potential to Achieve Goals: Fair ADL Goals Pt Will Perform Grooming: with modified independence;sitting Pt Will Perform Lower Body Dressing: with mod assist;sit to/from stand Pt Will Transfer to Toilet: with mod assist;bedside commode;stand pivot transfer Pt Will Perform Toileting - Clothing Manipulation and hygiene: with min assist;sit to/from stand Additional ADL Goal #1: Patient sitting on edge of bed participating in light ADLs and upper body activity maintaining balance for 10 minutes with contact-guard  Plan      Co-evaluation                 AM-PAC OT "6 Clicks" Daily Activity     Outcome Measure   Help from another person eating meals?: A Little Help from another person taking care of personal grooming?: A Little Help from another person toileting, which includes using toliet, bedpan, or urinal?: Total Help from another person bathing (including washing, rinsing, drying)?: Total Help from another person to put on and taking off regular upper body clothing?: A Lot Help from another person to put on  and taking off regular lower body clothing?: Total 6 Click Score: 11    End of Session    OT Visit Diagnosis: Unsteadiness on feet (R26.81);Other abnormalities of gait and mobility (R26.89);Repeated falls (R29.6);Muscle weakness (generalized) (M62.81);History of falling (Z91.81)   Activity Tolerance Patient limited by fatigue   Patient Left in bed;with call bell/phone within reach;with bed alarm set;with family/visitor present   Nurse Communication Mobility status        Time: 1191-4782 OT Time Calculation (min): 29 min  Charges: OT General Charges $OT Visit: 1 Visit OT Treatments $Self Care/Home Management : 8-22 mins $Therapeutic Activity: 8-22 mins  Linward Foster, MS, OTR/L  Alvester Morin 04/27/2023, 2:49 PM

## 2023-04-27 NOTE — TOC Progression Note (Addendum)
Transition of Care [facility]) - Progression Note    Patient Details  Name: Thomas [name] MRN: [id number] Date of Birth: 09-Dec-1930  [facility] of [facility] CM/SW Contact  Chapman Fitch, RN Phone Number: 04/27/2023, 9:23 AM  Clinical Narrative:     Message left for [facility] to confirm Berkley Harvey was started  Update:  [employer] confirms Berkley Harvey has been started  Expected Discharge Plan: Skilled Nursing Facility Barriers to Discharge: Continued Medical Work up  Expected Discharge Plan and Services     Post Acute Care Choice: Skilled Nursing Facility Living arrangements for the past 2 months: Single Family Home                                       Social Determinants of Health (SDOH) Interventions SDOH Screenings   Food Insecurity: Patient Unable To Answer (04/23/2023)  Housing: Patient Unable To Answer (04/23/2023)  Transportation Needs: Patient Unable To Answer (04/23/2023)  Utilities: Patient Unable To Answer (04/23/2023)  Financial Resource Strain: Low Risk  (10/12/2022)   Received from [facility]  Tobacco Use: Low Risk  (04/21/2023)  Health Literacy: High Risk (03/12/2023)   Received from [facility]    Readmission Risk Interventions     No data to display

## 2023-04-27 NOTE — Plan of Care (Signed)

## 2023-04-27 NOTE — Progress Notes (Signed)
Progress Note   Patient: Thomas [name]:[id number] DOB: December 03, 1930 DOA: 04/22/2023     5 DOS: the patient was seen and examined on 04/27/2023   Brief hospital course: Thomas [name] is a 89 y.o. Caucasian male with medical history significant for osteoarthritis, hypertension, dyslipidemia and GERD, who presented to the emergency room with acute onset of hematuria.  Patient recently was admitted with acute cholecystitis, antibiotics completed, he came back to the hospital had a cholecystostomy performed on 12/7.  Per daughter, patient has been confused and agitated since the procedure, he pulled out his Foley catheter is out in significant hematuria.  Also had a significant acute renal failure.  Patient was seen by urology, Foley catheter was placed again.  Patient was also given fluids. Urine culture came back no growth.   Principal Problem:   Sepsis due to gram-negative UTI (HCC) Active Problems:   Acute kidney injury superimposed on chronic kidney disease (HCC)   Dyslipidemia   GERD without esophagitis   Depression   BPH (benign prostatic hyperplasia)   History of acute cholecystitis   Cholecystostomy care [facility])   Urinary retention due to benign prostatic hyperplasia   Gross hematuria   Hypokalemia   Assessment and Plan:  SIRS present,  sepsis ruled out. Gross hematuria secondary to trauma from pulling out Foley catheter. UTI ruled out. Patient had a significant hematuria due to trauma from pulling out Foley catheter.  Bladder has been flushed, hematuria essentially resolved. Urine culture came back with no growth, antibiotic discontinued.   Anemia of chronic disease. Does not have B12 or iron deficiency.  Hemoglobin stable.   Acute kidney injury superimposed on chronic kidney disease 3a(HCC) Hypernatremia. Metabolic acidosis. Worsening renal function secondary to obstruction, Foley catheter was placed again, renal function improving.  However, patient has mild hyponatremia  on normal saline, was given half-normal saline. Patient also has mild metabolic acidosis. Renal function has normalized, discontinued IV fluids 12/14   BPH (benign prostatic hyperplasia) with urinary retention - We will continue Flomax.   Depression - We will continue Zoloft.   GERD without esophagitis - We will continue PPI therapy.   Dyslipidemia - We will continue statin therapy.   Patient condition has stabilized, currently pending nursing home placement.    Subjective:  Currently has no complaint.  Physical Exam: Vitals:   04/26/23 0907 04/26/23 2006 04/27/23 0413 04/27/23 0815  BP: (!) 142/74 109/87 99/65 104/67  Pulse: 62 79 70 80  Resp: 16 18 18    Temp: 97.9 F (36.6 C) 98.2 F (36.8 C) 98 F (36.7 C) (!) 97.4 F (36.3 C)  TempSrc:  Oral Oral Oral  SpO2:  98% 97% 97%  Weight:      Height:       General exam: Appears calm and comfortable  Respiratory system: Clear to auscultation. Respiratory effort normal. Cardiovascular system: S1 & S2 heard, RRR. No JVD, murmurs, rubs, gallops or clicks. No pedal edema. Gastrointestinal system: Abdomen is nondistended, soft and nontender. No organomegaly or masses felt. Normal bowel sounds heard. Central nervous system: Alert and oriented x2. No focal neurological deficits. Extremities: Symmetric 5 x 5 power. Skin: No rashes, lesions or ulcers Psychiatry:  Mood & affect appropriate.    Data Reviewed:  There are no new results to review at this time.  Family Communication: None  Disposition: Status is: Inpatient Remains inpatient appropriate because: Unsafe discharge option.     Time spent: 25 minutes  Author: Marrion Coy, MD 04/27/2023 12:27 PM  For on call review [email or web address].

## 2023-04-27 NOTE — Plan of Care (Signed)
Patient is discharging back to facility. IV removed. Patient will discharge will foley. Family notified. EMS scheduled for pick-up.

## 2023-04-27 NOTE — Progress Notes (Signed)
Report called to [employer] to Fraser, Charity fundraiser.

## 2023-04-27 NOTE — TOC Progression Note (Signed)
Transition of Care [facility]) - Progression Note    Patient Details  Name: MUSCAB GOODLOW MRN: [id number] Date of Birth: Apr 09, 1931  Transition of Care [facility]) CM/SW Contact  Chapman Fitch, RN Phone Number: 04/27/2023, 3:33 PM  Clinical Narrative:     Notified by [facility] that Berkley Harvey has been obtained  Patient will DC to: [facility] Anticipated DC date: 04/27/23  Family notified: Daughter Administrator, sports by: Wendie Simmer  Per MD patient ready for DC to . RN, , patient's family, and facility notified of DC. Discharge Summary sent to facility. RN given number for report. DC packet on chart. Ambulance transport requested for patient.  TOC signing off.    Expected Discharge Plan: Skilled Nursing Facility Barriers to Discharge: Continued Medical Work up  Expected Discharge Plan and Services     Post Acute Care Choice: Skilled Nursing Facility Living arrangements for the past 2 months: Single Family Home Expected Discharge Date: 04/27/23                                     Social Determinants of Health (SDOH) Interventions SDOH Screenings   Food Insecurity: Patient Unable To Answer (04/23/2023)  Housing: Patient Unable To Answer (04/23/2023)  Transportation Needs: Patient Unable To Answer (04/23/2023)  Utilities: Patient Unable To Answer (04/23/2023)  Financial Resource Strain: Low Risk  (10/12/2022)   Received from [facility]  Tobacco Use: Low Risk  (04/21/2023)  Health Literacy: High Risk (03/12/2023)   Received from [facility]    Readmission Risk Interventions     No data to display

## 2023-04-28 NOTE — Procedures (Addendum)
This is a delayed report as I was just notified by EEG technologist that this EEG has not been read.  I was not on-call on the day of study.  Patient Name: Thomas [name]  MRN: [id number]  Epilepsy Attending: Charlsie Quest  Referring Physician/Provider: Caryl Pina, MD  Date: 04/12/2023 Duration: 22.39 mins  Patient history: 89yo M with ams getting eeg to evaluate for seizure  Level of alertness: Awake, drowsy  AEDs during EEG study: None  Technical aspects: This EEG study was done with scalp electrodes positioned according to the 10-20 International system of electrode placement. Electrical activity was reviewed with band pass filter of 1-70Hz , sensitivity of 7 uV/mm, display speed of 23mm/sec with a 60Hz  notched filter applied as appropriate. EEG data were recorded continuously and digitally stored.  Video monitoring was available and reviewed as appropriate.  Description: The posterior dominant rhythm consists of 7.5 Hz activity of moderate voltage (25-35 uV) seen predominantly in posterior head regions, symmetric and reactive to eye opening and eye closing. Drowsiness was characterized by attenuation of the posterior background rhythm. Hyperventilation and photic stimulation were not performed.     IMPRESSION: This study is within normal limits. No seizures or epileptiform discharges were seen throughout the recording.  A normal interictal EEG does not exclude the diagnosis of epilepsy.  Ocia Simek Annabelle Harman

## 2023-05-15 ENCOUNTER — Other Ambulatory Visit: Payer: Self-pay

## 2023-05-15 ENCOUNTER — Emergency Department
Admission: EM | Admit: 2023-05-15 | Discharge: 2023-05-16 | Disposition: A | Discharge status: Home / Self Care | Payer: Medicare Other | Attending: Emergency Medicine | Admitting: Emergency Medicine

## 2023-05-15 DIAGNOSIS — F0390 Unspecified dementia without behavioral disturbance: Secondary | ICD-10-CM | POA: Diagnosis not present

## 2023-05-15 DIAGNOSIS — T85590A Other mechanical complication of bile duct prosthesis, initial encounter: Secondary | ICD-10-CM | POA: Insufficient documentation

## 2023-05-15 DIAGNOSIS — Y732 Prosthetic and other implants, materials and accessory gastroenterology and urology devices associated with adverse incidents: Secondary | ICD-10-CM | POA: Diagnosis not present

## 2023-05-15 DIAGNOSIS — T85528A Displacement of other gastrointestinal prosthetic devices, implants and grafts, initial encounter: Secondary | ICD-10-CM

## 2023-05-15 LAB — CBC WITH DIFFERENTIAL/PLATELET
Abs Immature Granulocytes: 0.04 10*3/uL (ref 0.00–0.07)
Basophils Absolute: 0 10*3/uL (ref 0.0–0.1)
Basophils Relative: 1 %
Eosinophils Absolute: 0.3 10*3/uL (ref 0.0–0.5)
Eosinophils Relative: 5 %
HCT: 33.4 % — ABNORMAL LOW (ref 39.0–52.0)
Hemoglobin: 10.5 g/dL — ABNORMAL LOW (ref 13.0–17.0)
Immature Granulocytes: 1 %
Lymphocytes Relative: 23 %
Lymphs Abs: 1.7 10*3/uL (ref 0.7–4.0)
MCH: 28.8 pg (ref 26.0–34.0)
MCHC: 31.4 g/dL (ref 30.0–36.0)
MCV: 91.5 fL (ref 80.0–100.0)
Monocytes Absolute: 0.5 10*3/uL (ref 0.1–1.0)
Monocytes Relative: 6 %
Neutro Abs: 4.6 10*3/uL (ref 1.7–7.7)
Neutrophils Relative %: 64 %
Platelets: 305 10*3/uL (ref 150–400)
RBC: 3.65 MIL/uL — ABNORMAL LOW (ref 4.22–5.81)
RDW: 14 % (ref 11.5–15.5)
WBC: 7.1 10*3/uL (ref 4.0–10.5)
nRBC: 0 % (ref 0.0–0.2)

## 2023-05-15 LAB — COMPREHENSIVE METABOLIC PANEL
ALT: 18 U/L (ref 0–44)
AST: 24 U/L (ref 15–41)
Albumin: 2.8 g/dL — ABNORMAL LOW (ref 3.5–5.0)
Alkaline Phosphatase: 85 U/L (ref 38–126)
Anion gap: 11 (ref 5–15)
BUN: 14 mg/dL (ref 8–23)
CO2: 23 mmol/L (ref 22–32)
Calcium: 8.6 mg/dL — ABNORMAL LOW (ref 8.9–10.3)
Chloride: 105 mmol/L (ref 98–111)
Creatinine, Ser: 1.11 mg/dL (ref 0.61–1.24)
GFR, Estimated: >60 mL/min (ref >60)
Glucose, Bld: 108 mg/dL — ABNORMAL HIGH (ref 70–99)
Potassium: 4 mmol/L (ref 3.5–5.1)
Sodium: 139 mmol/L (ref 135–145)
Total Bilirubin: 0.4 mg/dL (ref 0.0–1.2)
Total Protein: 6.5 g/dL (ref 6.5–8.1)

## 2023-05-15 NOTE — ED Provider Notes (Signed)
   [facility] Provider Note    Event Date/Time   First MD Initiated Contact with Patient 05/15/23 2116     (approximate)   History   cholycystectomy tube replacement   HPI  Thomas [name] is a 89 y.o. male who presents to the emergency department today from living facility after accidentally pulling out his cholecystectomy tube.  This was placed about a month ago when patient was found to have acute cholecystitis and is nonsurgical candidate.  Per son at bedside this is something he supposed to have for the rest of his life.  Patient himself has dementia and denies any complaints.     Physical Exam   Triage Vital Signs: ED Triage Vitals [05/15/23 2146]  Encounter Vitals Group     BP 122/76     Systolic BP Percentile      Diastolic BP Percentile      Pulse Rate 88     Resp 16     Temp 98 F (36.7 C)     Temp Source Oral     SpO2 95 %     Weight      Height      Head Circumference      Peak Flow      Pain Score      Pain Loc      Pain Education      Exclude from Growth Chart     Most recent vital signs: Vitals:   05/15/23 2146  BP: 122/76  Pulse: 88  Resp: 16  Temp: 98 F (36.7 C)  SpO2: 95%   General: Awake, alert, not oriented. CV:  Good peripheral perfusion.  Resp:  Normal effort.  Abd:  No distention. Percutaneous tube site without any active bleeding or surrounding erythema. Abdomen non tender.   ED Results / Procedures / Treatments   Labs (all labs ordered are listed, but only abnormal results are displayed) Labs Reviewed  CBC WITH DIFFERENTIAL/PLATELET - Abnormal; Notable for the following components:      Result Value   RBC 3.65 (*)    Hemoglobin 10.5 (*)    HCT 33.4 (*)    All other components within normal limits  COMPREHENSIVE METABOLIC PANEL - Abnormal; Notable for the following components:   Glucose, Bld 108 (*)    Calcium  8.6 (*)    Albumin 2.8 (*)    All other components within normal limits      EKG  None   RADIOLOGY None   PROCEDURES:  Critical Care performed: No   MEDICATIONS ORDERED IN ED: Medications - No data to display   IMPRESSION / MDM / ASSESSMENT AND PLAN / ED COURSE  Patient presented to the emergency department today after accidentally pulling out his percutaneous cholecystostomy tube.  On exam there are no signs of infection.  There is no active bleeding.  Abdomen is benign.  Blood work without concerning leukocytosis or abnormality.  I did discuss with interventional radiologist on-call.  They will plan on replacing tube in the morning.     FINAL CLINICAL IMPRESSION(S) / ED DIAGNOSES   Final diagnoses:  Migration of percutaneous cholecystostomy tube      Note:  This document was prepared using Dragon voice recognition software and may include unintentional dictation errors.    Floy Roberts, MD 05/15/23 [phone]

## 2023-05-15 NOTE — ED Triage Notes (Signed)
 BIB [facility] from [facility] for nephrostomy tube and needs it replaced. Staff was doing a med pass and found it.  84 HR 97% RA 122/72 Dementia but at baseline. Pt picks at everything and pulls things out per staff at facility.

## 2023-05-16 ENCOUNTER — Emergency Department: Payer: Medicare Other | Admitting: Radiology

## 2023-05-16 HISTORY — PX: IR EXCHANGE BILIARY DRAIN: IMG6046

## 2023-05-16 MED ORDER — LIDOCAINE HCL 1 % IJ SOLN
INTRAMUSCULAR | Status: AC
Start: 2023-05-16 — End: ?
  Filled 2023-05-16: qty 20

## 2023-05-16 MED ORDER — LIDOCAINE HCL 1 % IJ SOLN
8.0000 mL | Freq: Once | INTRAMUSCULAR | Status: AC
  Administered 2023-05-16: 8 mL via INTRADERMAL

## 2023-05-16 MED ORDER — IOHEXOL 300 MG/ML  SOLN
5.0000 mL | Freq: Once | INTRAMUSCULAR | Status: AC | PRN
  Administered 2023-05-16: 5 mL

## 2023-05-16 NOTE — ED Notes (Signed)
 Attempted to contact [employer] to provide report; no answer

## 2023-05-16 NOTE — ED Notes (Signed)
 Pt keeps removing his cords for monitoring. So this RN removed until need further repeat vitals.

## 2023-05-16 NOTE — ED Provider Notes (Addendum)
 IR EXCHANGE BILIARY DRAIN Result Date: 05/16/2023 INDICATION: 89 year old male with history of acute cholecystitis status post percutaneous cholecystostomy tube placement on 04/17/2023. The patient presents to the emergency department after accidental removal of indwelling cholecystostomy tube. EXAM: Fluoroscopic guided cholecystostomy tube replacement MEDICATIONS: None. ANESTHESIA/SEDATION: None. FLUOROSCOPY TIME:  Eleven mGy COMPLICATIONS: None immediate. PROCEDURE: Informed written consent was obtained from the patient after a thorough discussion of the procedural risks, benefits and alternatives. All questions were addressed. Maximal Sterile Barrier Technique was utilized including caps, mask, sterile gowns, sterile gloves, sterile drape, hand hygiene and skin antiseptic. A timeout was performed prior to the initiation of the procedure. Preprocedure scout radiograph of the right upper quadrant demonstrated absence of previously visualized indwelling percutaneous cholecystostomy tube. Subdermal Local anesthesia was provided at the established skin entry site with 1% lidocaine . And an angled Delight Chang catheter was inserted into the established tract and contrast was injected which ultimately opacified the gallbladder. The catheter was advanced into the gallbladder body. An Amplatz wire was inserted and coiled within the gallbladder. The catheter was removed and exchanged for a 10 French skater drain. Pigtail portion was formed in the gallbladder body, attached to bag drainage, and affixed to the skin with a 0 silk suture. Sterile bandage was applied. The patient tolerated the procedure well and was transferred back to the emergency department good condition. IMPRESSION: Technically successful fluoroscopic guided percutaneous cholecystostomy tube replacement via established track. Ester Sides, MD [facility] Electronically Signed   By: Ester Sides M.D.    On: 05/16/2023 09:11      ----------------------------------------- 9:38 AM on 05/16/2023 ----------------------------------------- Resting at home without distress.  Plan of care is replacement of cholecystostomy tube, which has been completed.  Reassuring lab work no fever.  Patient does have some confusion, but is redirectable.  He has a history of dementia.  Plan of care at this time is to return him to his care facility for ongoing care and treatment including maintenance of his cholecystostomy tube   Dicky Anes, MD 05/16/23 [phone]    Discussed with Velinda Daring, family. Understanding of plan to return patient to [facility], MD 05/16/23 [phone]

## 2023-05-16 NOTE — ED Notes (Signed)
 [facility] called at 9:54 to transport [facility]

## 2023-05-16 NOTE — Procedures (Signed)
 Interventional Radiology Procedure Note  Procedure: Percutaneous cholecystostomy tube replacement  Findings: Please refer to procedural dictation for full description. 10 Fr percutaneous cholecystostomy via established track, to bag drainage.  Complications: None immediate  Estimated Blood Loss: < 5 mL  Recommendations: Keep to bag drainage. Follow up in IR in 8 weeks for routine check/exchange.   Ester Sides, MD

## 2023-05-16 NOTE — Discharge Instructions (Signed)
 Follow-up closely with your primary care doctor this week for a reexam and recheck.  Please return to the emergency room right away if patient develops a fever, severe nausea, has pain, is unable to keep food down, begin vomiting any dark or bloody fluid, you develop any dark or bloody stools, feel dehydrated, or other new concerns or symptoms arise.

## 2023-05-17 ENCOUNTER — Other Ambulatory Visit: Payer: Self-pay | Admitting: Interventional Radiology

## 2023-05-17 DIAGNOSIS — K819 Cholecystitis, unspecified: Secondary | ICD-10-CM

## 2023-05-20 ENCOUNTER — Ambulatory Visit: Payer: Self-pay | Admitting: Urology

## 2023-05-27 ENCOUNTER — Ambulatory Visit: Payer: Medicare Other | Admitting: Urology

## 2023-05-27 ENCOUNTER — Other Ambulatory Visit: Payer: Self-pay

## 2023-05-27 ENCOUNTER — Ambulatory Visit (INDEPENDENT_AMBULATORY_CARE_PROVIDER_SITE_OTHER): Payer: Medicare Other | Admitting: Urology

## 2023-05-27 ENCOUNTER — Emergency Department
Admission: EM | Admit: 2023-05-27 | Discharge: 2023-05-28 | Disposition: A | Discharge status: Home / Self Care | Payer: Medicare Other | Attending: Emergency Medicine | Admitting: Emergency Medicine

## 2023-05-27 VITALS — BP 122/74 | HR 78 | Temp 98.0°F

## 2023-05-27 DIAGNOSIS — T85520A Displacement of bile duct prosthesis, initial encounter: Secondary | ICD-10-CM | POA: Diagnosis present

## 2023-05-27 DIAGNOSIS — R339 Retention of urine, unspecified: Secondary | ICD-10-CM | POA: Diagnosis not present

## 2023-05-27 DIAGNOSIS — Y69 Unspecified misadventure during surgical and medical care: Secondary | ICD-10-CM | POA: Insufficient documentation

## 2023-05-27 DIAGNOSIS — N401 Enlarged prostate with lower urinary tract symptoms: Secondary | ICD-10-CM

## 2023-05-27 DIAGNOSIS — Z466 Encounter for fitting and adjustment of urinary device: Secondary | ICD-10-CM

## 2023-05-27 DIAGNOSIS — R338 Other retention of urine: Secondary | ICD-10-CM

## 2023-05-27 DIAGNOSIS — T85528A Displacement of other gastrointestinal prosthetic devices, implants and grafts, initial encounter: Secondary | ICD-10-CM

## 2023-05-27 LAB — BLADDER SCAN AMB NON-IMAGING: PVR: 23 WU

## 2023-05-27 MED ORDER — SULFAMETHOXAZOLE-TRIMETHOPRIM 800-160 MG PO TABS
1.0000 | ORAL_TABLET | Freq: Once | ORAL | Status: AC
  Administered 2023-05-27: 1 via ORAL

## 2023-05-27 NOTE — ED Triage Notes (Signed)
Pt to ED via EMS from [facility], pt had Biliary tube placed 11 days ago, staff at facility cut it in half and then pulled it out on accident. Incision site is scabbed over no drainage at this time, pt denies complaints.

## 2023-05-27 NOTE — Progress Notes (Signed)
   05/27/2023 8:17 AM   [name] March 03, 1931 [id number]  Reason for visit: Follow up urinary retention, gross hematuria  HPI: Extremely comorbid 89 year old male with dementia, history of stroke, anticoagulation with Plavix, cholecystitis managed with chronic biliary drain and not felt to be surgical candidate, and history of urinary retention as well as gross hematuria.  He resides in [facility], here today with his son-in-law Jorja Loa who provides most of the history.  He was originally seen as an inpatient on 03/22/2023 by Dr. Annabell Howells after he traumatically removed his Foley in the ICU and had gross hematuria and elevated bladder scan of 700 mL.  Dr. Annabell Howells placed a 22 French coud Foley at that time.  He was then hospitalized for acute cholecystitis requiring a biliary drain.  General surgery did not feel he would be a surgical candidate at any point, and plan is to exchange biliary drain lifelong.  Urology was consulted again on 04/22/2023 for gross hematuria after he had traumatically pulled his Foley into the prostatic urethra, at that point our PA [employer] upsized his Foley to a 22 [place] coud and irrigated the bladder.  He did not require CBI.  He has been on Flomax since that time.  I personally viewed and interpreted the CT scan dated 04/21/2023 with prostate measuring 71 g.  Extensive hospital records and ER notes were reviewed.  Per his son-in-law he thinks he may have had 1 voiding trial at his facility, but he thinks he did not drink enough during the day and unsure if he was ever completely full.  We discussed at length the challenges of managing his retention with his dementia and comorbidities.  Using shared decision making we opted for a voiding trial today.  Bactrim was given for prophylaxis, Foley catheter was removed.  He returned this afternoon and has been able to void with normal PVR of 23ml.  Continue flomax, add finasteride RTC 30mo PVR, sooner if  problems   Sondra Come, MD  [facility] [address] [place], [place] [address] [phone]

## 2023-05-27 NOTE — Progress Notes (Signed)
Catheter Removal  Patient is present today for a catheter removal. 15ml of water was drained from the balloon. A 22FR coude foley cath was removed from the bladder, no complications were noted. Patient tolerated well.  Performed by: Debbe Bales, CMA  Follow up/ Additional notes: RTC as scheduled for PVR

## 2023-05-27 NOTE — ED Provider Notes (Signed)
[facility] Provider Note    Event Date/Time   First MD Initiated Contact with Patient 05/27/23 2322     (approximate)   History   No chief complaint on file.   HPI Thomas [name] is a 89 y.o. male with history of HLD, GERD presenting today for dislodged cholecystostomy tube dislodgment.  Nursing home called EMS after they reportedly found his percutaneous cholecystostomy tube broken.  They then looked at the site and found that it was completely dislodged.  Patient denies any pain complaints.  No other acute symptoms reported by nursing staff.     Physical Exam   Triage Vital Signs: ED Triage Vitals  Encounter Vitals Group     BP      Systolic BP Percentile      Diastolic BP Percentile      Pulse      Resp      Temp      Temp src      SpO2      Weight      Height      Head Circumference      Peak Flow      Pain Score      Pain Loc      Pain Education      Exclude from Growth Chart     Most recent vital signs: Vitals:   05/28/23 0220 05/28/23 0505  BP: (!) 103/90 117/68  Pulse: 84 71  Resp: 18 18  Temp:    SpO2: 94% 94%    I have reviewed the vital signs. General:  Awake, alert, no acute distress.  Oriented to person and place. Head:  Normocephalic, Atraumatic. EENT:  PERRL, EOMI, Oral mucosa pink and moist, Neck is supple. Cardiovascular: Regular rate, 2+ distal pulses. Respiratory:  Normal respiratory effort, symmetrical expansion, no distress.  Abdomen: Soft, nontender, nondistended.  Cholecystostomy tube completely removed from right upper quadrant with crusting at the site and cut sutures.  See media tab for photo. Extremities:  Moving all four extremities through full ROM without pain.   Neuro:  Alert and oriented.  Interacting appropriately.   Skin:  Warm, dry, no rash.   Psych: Appropriate affect.    ED Results / Procedures / Treatments   Labs (all labs ordered are listed, but only abnormal results are displayed) Labs  Reviewed  CBC WITH DIFFERENTIAL/PLATELET - Abnormal; Notable for the following components:      Result Value   RBC 3.73 (*)    Hemoglobin 10.6 (*)    HCT 34.3 (*)    All other components within normal limits  BASIC METABOLIC PANEL - Abnormal; Notable for the following components:   BUN 24 (*)    GFR, Estimated 55 (*)    All other components within normal limits     EKG    RADIOLOGY    PROCEDURES:  Critical Care performed: No  Procedures   MEDICATIONS ORDERED IN ED: Medications - No data to display   IMPRESSION / MDM / ASSESSMENT AND PLAN / ED COURSE  I reviewed the triage vital signs and the nursing notes.                              Differential diagnosis includes, but is not limited to, cholecystostomy tube displacement  Patient's presentation is most consistent with acute complicated illness / injury requiring diagnostic workup.  Patient is a 89 year old male presenting today  for dislodgment of his cholecystostomy tube.  No acute abdominal pain.  Vital signs stable.  Physical exam largely unremarkable aside from completely dislodged tube.  Spoke with interventional radiology who says they can get patient on the schedule tomorrow morning for placement of the tube.  Patient signed out in the morning to oncoming provider while awaiting for IR replacement.  Clinical Course as of 05/28/23 0523  Fri May 28, 2023  0030 Spoke with Dr. Elby Showers (IR) - he will forward the consult to the day team so that patient can have tube replaced in the AM [DW]    Clinical Course User Index [DW] Anner Crete Donetta Potts, MD     FINAL CLINICAL IMPRESSION(S) / ED DIAGNOSES   Final diagnoses:  Detachment of percutaneous cholecystostomy tube     Rx / DC Orders   ED Discharge Orders     None        Note:  This document was prepared using Dragon voice recognition software and may include unintentional dictation errors.   Janith Lima, MD 05/28/23 [phone]

## 2023-05-28 ENCOUNTER — Emergency Department: Payer: Medicare Other | Admitting: Radiology

## 2023-05-28 DIAGNOSIS — T85520A Displacement of bile duct prosthesis, initial encounter: Secondary | ICD-10-CM | POA: Diagnosis not present

## 2023-05-28 HISTORY — PX: IR EXCHANGE BILIARY DRAIN: IMG6046

## 2023-05-28 LAB — CBC WITH DIFFERENTIAL/PLATELET
Abs Immature Granulocytes: 0.05 10*3/uL (ref 0.00–0.07)
Basophils Absolute: 0 10*3/uL (ref 0.0–0.1)
Basophils Relative: 0 %
Eosinophils Absolute: 0.5 10*3/uL (ref 0.0–0.5)
Eosinophils Relative: 6 %
HCT: 34.3 % — ABNORMAL LOW (ref 39.0–52.0)
Hemoglobin: 10.6 g/dL — ABNORMAL LOW (ref 13.0–17.0)
Immature Granulocytes: 1 %
Lymphocytes Relative: 20 %
Lymphs Abs: 1.8 10*3/uL (ref 0.7–4.0)
MCH: 28.4 pg (ref 26.0–34.0)
MCHC: 30.9 g/dL (ref 30.0–36.0)
MCV: 92 fL (ref 80.0–100.0)
Monocytes Absolute: 0.6 10*3/uL (ref 0.1–1.0)
Monocytes Relative: 7 %
Neutro Abs: 5.8 10*3/uL (ref 1.7–7.7)
Neutrophils Relative %: 66 %
Platelets: 298 10*3/uL (ref 150–400)
RBC: 3.73 MIL/uL — ABNORMAL LOW (ref 4.22–5.81)
RDW: 14.5 % (ref 11.5–15.5)
WBC: 8.8 10*3/uL (ref 4.0–10.5)
nRBC: 0 % (ref 0.0–0.2)

## 2023-05-28 LAB — BASIC METABOLIC PANEL
Anion gap: 10 (ref 5–15)
BUN: 24 mg/dL — ABNORMAL HIGH (ref 8–23)
CO2: 24 mmol/L (ref 22–32)
Calcium: 9 mg/dL (ref 8.9–10.3)
Chloride: 108 mmol/L (ref 98–111)
Creatinine, Ser: 1.24 mg/dL (ref 0.61–1.24)
GFR, Estimated: 55 mL/min — ABNORMAL LOW (ref >60)
Glucose, Bld: 89 mg/dL (ref 70–99)
Potassium: 4.3 mmol/L (ref 3.5–5.1)
Sodium: 142 mmol/L (ref 135–145)

## 2023-05-28 MED ORDER — IOHEXOL 300 MG/ML  SOLN
5.0000 mL | Freq: Once | INTRAMUSCULAR | Status: AC | PRN
  Administered 2023-05-28: 5 mL

## 2023-05-28 MED ORDER — LIDOCAINE HCL 1 % IJ SOLN
INTRAMUSCULAR | Status: AC
  Filled 2023-05-28: qty 20

## 2023-05-28 MED ORDER — IOHEXOL 300 MG/ML  SOLN: 50.0000 mL | Freq: Once | INTRAMUSCULAR | Status: DC | PRN

## 2023-05-28 MED ORDER — LIDOCAINE HCL 1 % IJ SOLN
3.0000 mL | Freq: Once | INTRAMUSCULAR | Status: AC
  Administered 2023-05-28: 3 mL via INTRADERMAL

## 2023-05-28 NOTE — ED Notes (Signed)
OR tech placed an activity blanket over drain so patient could have something to keep him occupied instead of pulling at drain.

## 2023-05-28 NOTE — ED Notes (Signed)
Pt transported to IR at this time.

## 2023-05-28 NOTE — ED Notes (Signed)
Attempted to call [facility] and staff didn't answer.

## 2023-05-28 NOTE — ED Notes (Signed)
Patient is out of the room.

## 2023-05-28 NOTE — ED Notes (Signed)
C com called for transport to [facility]

## 2023-05-28 NOTE — ED Notes (Signed)
Patient is resting, easily awakened. Waiting for transport.

## 2023-05-28 NOTE — ED Notes (Signed)
Pt contact made and myself introduced. Pt is CAO per his normal and breathing normally. Pt is relaxed and does not appear to be in any distress. Family is sitting with pt.

## 2023-05-28 NOTE — ED Notes (Signed)
Patient is sleeping, waiting for transport.

## 2023-05-30 ENCOUNTER — Telehealth: Payer: Self-pay | Admitting: Urology

## 2023-05-30 MED ORDER — FINASTERIDE 5 MG PO TABS: 5.0000 mg | ORAL_TABLET | Freq: Every day | ORAL | 3 refills | Status: DC

## 2023-05-30 MED ORDER — TAMSULOSIN HCL 0.4 MG PO CAPS: 0.4000 mg | ORAL_CAPSULE | Freq: Every day | ORAL | 3 refills | Status: AC

## 2023-05-30 NOTE — Progress Notes (Signed)
Patient presented this morning for voiding trial.  He states that he has been voiding into his depends.   His PVR 23 mL.  He will continue tamsulosin 0.4 mg daily and finasteride 5 mg daily.  He will follow-up with Dr. Richardo Hanks in 4 months.

## 2023-05-30 NOTE — Telephone Encounter (Signed)
Would you please call Mr. Herbig and let them know that I have renewed the tamsulosin prescription also sent a prescription for for the finasteride 5 mg daily for him to start as well.  He needs to stay on these medications until he sees Dr. Richardo Hanks in 4 months.

## 2023-05-31 ENCOUNTER — Other Ambulatory Visit (INDEPENDENT_AMBULATORY_CARE_PROVIDER_SITE_OTHER): Payer: Self-pay | Admitting: Vascular Surgery

## 2023-05-31 DIAGNOSIS — I6523 Occlusion and stenosis of bilateral carotid arteries: Secondary | ICD-10-CM

## 2023-05-31 NOTE — Telephone Encounter (Signed)
[facility] where patient is currently residing to ensure they received orders for new medications. No answer. LM for nurse to call back.

## 2023-06-02 DIAGNOSIS — I6529 Occlusion and stenosis of unspecified carotid artery: Secondary | ICD-10-CM | POA: Insufficient documentation

## 2023-06-02 NOTE — Progress Notes (Unsigned)
Patient ID: Thomas [name], male   DOB: 13-Apr-1931, 89 y.o.   MRN: [id number]  No chief complaint on file.   HPI Thomas [name] is a 89 y.o. male.    The patient is seen for follow up evaluation of carotid stenosis status post right carotid endarterectomy on 03/19/2023.    Procedure: 1.  Right carotid endarterectomy with bovine pericardial patch reconstruction. 2.  Resection of a right internal carotid artery kink with removal of 20 mm of internal carotid artery and direct reanastomosis to the bulb  There were no post operative problems or complications related to the surgery.  The patient denies neck or incisional pain.  The patient denies interval amaurosis fugax. There is no recent history of TIA symptoms or focal motor deficits. There is no prior documented CVA.  The patient denies headache.  The patient is taking enteric-coated aspirin 81 mg daily.  No recent shortening of the patient's walking distance or new symptoms consistent with claudication.  No history of rest pain symptoms. No new ulcers or wounds of the lower extremities have occurred.  There is no history of DVT, PE or superficial thrombophlebitis. No recent episodes of angina or shortness of breath documented.    Past Medical History:  Diagnosis Date   Arthritis    Carpal tunnel syndrome on both sides    GERD (gastroesophageal reflux disease)    Hyperlipidemia    Hypertension     Past Surgical History:  Procedure Laterality Date   ENDARTERECTOMY Right 03/19/2023   Procedure: ENDARTERECTOMY CAROTID;  Surgeon: Renford Dills, MD;  Location: [facility] ORS;  Service: Vascular;  Laterality: Right;   INGUINAL HERNIA REPAIR  2001   IR EXCHANGE BILIARY DRAIN  05/16/2023   IR EXCHANGE BILIARY DRAIN  05/28/2023   IR PERC CHOLECYSTOSTOMY  04/17/2023   REPLACEMENT TOTAL KNEE Bilateral       No Known Allergies  Current Outpatient Medications  Medication Sig Dispense Refill   acetaminophen (TYLENOL) 325 MG tablet  Take 2 tablets (650 mg total) by mouth every 6 (six) hours as needed for fever, headache or moderate pain. (Patient taking differently: Take 325 mg by mouth every 6 (six) hours as needed for fever, headache or moderate pain (pain score 4-6).)     aspirin 81 MG chewable tablet Chew 1 tablet (81 mg total) by mouth daily.     atorvastatin (LIPITOR) 40 MG tablet Take 1 tablet (40 mg total) by mouth daily.     bisacodyl (DULCOLAX) 10 MG suppository Place 1 suppository (10 mg total) rectally daily as needed for severe constipation.     bisacodyl (DULCOLAX) 5 MG EC tablet Take 2 tablets (10 mg total) by mouth at bedtime.     cyanocobalamin (VITAMIN B12) 1000 MCG tablet Take 1,000 mcg by mouth daily.     finasteride (PROSCAR) 5 MG tablet Take 1 tablet (5 mg total) by mouth daily. 90 tablet 3   pantoprazole (PROTONIX) 40 MG tablet Take 1 tablet (40 mg total) by mouth daily.     polyethylene glycol (MIRALAX / GLYCOLAX) 17 g packet Take 17 g by mouth 2 (two) times daily.     polyvinyl alcohol (LIQUIFILM TEARS) 1.4 % ophthalmic solution Place 2 drops into both eyes as needed for dry eyes.     sertraline (ZOLOFT) 25 MG tablet Take 1 tablet by mouth daily.     tamsulosin (FLOMAX) 0.4 MG CAPS capsule Take 1 capsule (0.4 mg total) by mouth daily after supper.  tamsulosin (FLOMAX) 0.4 MG CAPS capsule Take 1 capsule (0.4 mg total) by mouth daily. 90 capsule 3   No current facility-administered medications for this visit.        Physical Exam There were no vitals taken for this visit. Gen:  WD/WN, NAD Skin: incision C/D/I     Assessment/Plan:  No problem-specific Assessment & Plan notes found for this encounter.      Levora Dredge 06/02/2023, 12:11 PM   This note was created with Dragon medical transcription system.  Any errors from dictation are unintentional.

## 2023-06-03 ENCOUNTER — Ambulatory Visit (INDEPENDENT_AMBULATORY_CARE_PROVIDER_SITE_OTHER): Payer: Self-pay | Admitting: Vascular Surgery

## 2023-06-03 ENCOUNTER — Encounter (INDEPENDENT_AMBULATORY_CARE_PROVIDER_SITE_OTHER): Payer: Self-pay | Admitting: Vascular Surgery

## 2023-06-03 ENCOUNTER — Ambulatory Visit (INDEPENDENT_AMBULATORY_CARE_PROVIDER_SITE_OTHER): Payer: Self-pay

## 2023-06-03 VITALS — BP 89/57 | HR 87 | Resp 20 | Ht 68.0 in | Wt 163.0 lb

## 2023-06-03 DIAGNOSIS — I6521 Occlusion and stenosis of right carotid artery: Secondary | ICD-10-CM

## 2023-06-03 DIAGNOSIS — I6523 Occlusion and stenosis of bilateral carotid arteries: Secondary | ICD-10-CM

## 2023-06-03 DIAGNOSIS — I639 Cerebral infarction, unspecified: Secondary | ICD-10-CM

## 2023-07-21 ENCOUNTER — Other Ambulatory Visit: Payer: Self-pay

## 2023-07-21 ENCOUNTER — Emergency Department
Admission: EM | Admit: 2023-07-21 | Discharge: 2023-07-21 | Disposition: A | Discharge status: Home / Self Care | Attending: Emergency Medicine | Admitting: Emergency Medicine

## 2023-07-21 ENCOUNTER — Encounter: Payer: Self-pay | Admitting: Emergency Medicine

## 2023-07-21 DIAGNOSIS — Y9389 Activity, other specified: Secondary | ICD-10-CM | POA: Insufficient documentation

## 2023-07-21 DIAGNOSIS — T85518A Breakdown (mechanical) of other gastrointestinal prosthetic devices, implants and grafts, initial encounter: Secondary | ICD-10-CM | POA: Diagnosis present

## 2023-07-21 LAB — BASIC METABOLIC PANEL
Anion gap: 10 (ref 5–15)
BUN: 18 mg/dL (ref 8–23)
CO2: 25 mmol/L (ref 22–32)
Calcium: 9.3 mg/dL (ref 8.9–10.3)
Chloride: 107 mmol/L (ref 98–111)
Creatinine, Ser: 0.83 mg/dL (ref 0.61–1.24)
GFR, Estimated: >60 mL/min (ref >60)
Glucose, Bld: 111 mg/dL — ABNORMAL HIGH (ref 70–99)
Potassium: 3.7 mmol/L (ref 3.5–5.1)
Sodium: 142 mmol/L (ref 135–145)

## 2023-07-21 LAB — CBC WITH DIFFERENTIAL/PLATELET
Abs Immature Granulocytes: 0.05 10*3/uL (ref 0.00–0.07)
Basophils Absolute: 0 10*3/uL (ref 0.0–0.1)
Basophils Relative: 0 %
Eosinophils Absolute: 0.4 10*3/uL (ref 0.0–0.5)
Eosinophils Relative: 5 %
HCT: 34.5 % — ABNORMAL LOW (ref 39.0–52.0)
Hemoglobin: 10.9 g/dL — ABNORMAL LOW (ref 13.0–17.0)
Immature Granulocytes: 1 %
Lymphocytes Relative: 20 %
Lymphs Abs: 1.8 10*3/uL (ref 0.7–4.0)
MCH: 28.3 pg (ref 26.0–34.0)
MCHC: 31.6 g/dL (ref 30.0–36.0)
MCV: 89.6 fL (ref 80.0–100.0)
Monocytes Absolute: 0.5 10*3/uL (ref 0.1–1.0)
Monocytes Relative: 6 %
Neutro Abs: 5.9 10*3/uL (ref 1.7–7.7)
Neutrophils Relative %: 68 %
Platelets: 256 10*3/uL (ref 150–400)
RBC: 3.85 MIL/uL — ABNORMAL LOW (ref 4.22–5.81)
RDW: 15.8 % — ABNORMAL HIGH (ref 11.5–15.5)
WBC: 8.6 10*3/uL (ref 4.0–10.5)
nRBC: 0 % (ref 0.0–0.2)

## 2023-07-21 LAB — HEPATIC FUNCTION PANEL
ALT: 12 U/L (ref 0–44)
AST: 16 U/L (ref 15–41)
Albumin: 2.8 g/dL — ABNORMAL LOW (ref 3.5–5.0)
Alkaline Phosphatase: 79 U/L (ref 38–126)
Bilirubin, Direct: 0.1 mg/dL (ref 0.0–0.2)
Indirect Bilirubin: 0.6 mg/dL (ref 0.3–0.9)
Total Bilirubin: 0.7 mg/dL (ref 0.0–1.2)
Total Protein: 6.9 g/dL (ref 6.5–8.1)

## 2023-07-21 NOTE — ED Provider Notes (Signed)
 [facility] Provider Note   Event Date/Time   First MD Initiated Contact with Patient 07/21/23 0915     (approximate) History  Tube Removal  HPI Thomas [name] is a 89 y.o. male with a past medical history of HLD, GERD, and chronic cholecystostomy tube who presents from nursing home after patient reportedly pulled his percutaneous cholecystostomy tube overnight.  This is the third time the last 4 months patient has pulled this cholecystostomy tube.  Patient cannot explain why he pulled it and only states "I will not do it again". ROS: Difficult to obtain however patient denies any pain   Physical Exam  Triage Vital Signs: ED Triage Vitals  Encounter Vitals Group     BP 07/21/23 0847 97/72     Systolic BP Percentile --      Diastolic BP Percentile --      Pulse Rate 07/21/23 0847 95     Resp 07/21/23 0847 18     Temp 07/21/23 0847 98.6 F (37 C)     Temp Source 07/21/23 0847 Oral     SpO2 07/21/23 0847 95 %     Weight 07/21/23 0849 160 lb 15 oz (73 kg)     Height 07/21/23 0849 5\' 8"  (1.727 m)     Head Circumference --      Peak Flow --      Pain Score 07/21/23 0848 0     Pain Loc --      Pain Education --      Exclude from Growth Chart --    Most recent vital signs: Vitals:   07/21/23 0847  BP: 97/72  Pulse: 95  Resp: 18  Temp: 98.6 F (37 C)  SpO2: 95%   General: Awake, cooperative CV:  Good peripheral perfusion.  Resp:  Normal effort.  Abd:  No distention.  Other:  Elderly well-developed, well-nourished Caucasian male resting comfortably in no acute distress.  Surgical site in mid right lower quadrant without previous cholecystostomy tube and crusted material over ostomy site ED Results / Procedures / Treatments  Labs (all labs ordered are listed, but only abnormal results are displayed) Labs Reviewed  CBC WITH DIFFERENTIAL/PLATELET - Abnormal; Notable for the following components:      Result Value   RBC 3.85 (*)    Hemoglobin 10.9 (*)     HCT 34.5 (*)    RDW 15.8 (*)    All other components within normal limits  BASIC METABOLIC PANEL - Abnormal; Notable for the following components:   Glucose, Bld 111 (*)    All other components within normal limits  HEPATIC FUNCTION PANEL - Abnormal; Notable for the following components:   Albumin 2.8 (*)    All other components within normal limits   PROCEDURES: Critical Care performed: No Procedures MEDICATIONS ORDERED IN ED: Medications - No data to display IMPRESSION / MDM / ASSESSMENT AND PLAN / ED COURSE  I reviewed the triage vital signs and the nursing notes.                             The patient is on the cardiac monitor to evaluate for evidence of arrhythmia and/or significant heart rate changes. Patient's presentation is most consistent with acute presentation with potential threat to life or bodily function. Patient is a 89 year old male with a past medical history of dementia who presents via EMS from a long-term care facility after allegedly pulling out  his cholecystostomy tube overnight.  Patient has history of the same.  Ostomy site shows no signs of cellulitis at this time.  I spoken to Dr. Archer Asa in interventional radiology who recommends a "tube holiday" and replacement of this drainage tube if symptoms recur.. The patient has been reexamined and is ready to be discharged.  All diagnostic results have been reviewed and discussed with the patient/family.  Care plan has been outlined and the patient/family understands all current diagnoses, results, and treatment plans.  There are no new complaints, changes, or physical findings at this time.  All questions have been addressed and answered.  Patient was instructed to, and agrees to follow-up with their primary care physician as well as return to the emergency department if any new or worsening symptoms develop.   FINAL CLINICAL IMPRESSION(S) / ED DIAGNOSES   Final diagnoses:  Cholecystostomy tube dysfunction,  initial encounter   Rx / DC Orders   ED Discharge Orders     None      Note:  This document was prepared using Dragon voice recognition software and may include unintentional dictation errors.   Merwyn Katos, MD 07/21/23 1009

## 2023-07-21 NOTE — ED Triage Notes (Signed)
 Patient to ED via [facility] from [facility] for pulling out his cholecystectomy tube. Hx of same. At baseline per facility.

## 2023-08-04 ENCOUNTER — Inpatient Hospital Stay
Admission: EM | Admit: 2023-08-04 | Discharge: 2023-08-09 | DRG: 920 | Disposition: A | Discharge status: Skilled Nursing Facility | Source: Skilled Nursing Facility | Attending: Internal Medicine | Admitting: Internal Medicine

## 2023-08-04 ENCOUNTER — Emergency Department

## 2023-08-04 ENCOUNTER — Other Ambulatory Visit: Payer: Self-pay

## 2023-08-04 DIAGNOSIS — D649 Anemia, unspecified: Secondary | ICD-10-CM | POA: Diagnosis not present

## 2023-08-04 DIAGNOSIS — Z8673 Personal history of transient ischemic attack (TIA), and cerebral infarction without residual deficits: Secondary | ICD-10-CM

## 2023-08-04 DIAGNOSIS — D6489 Other specified anemias: Secondary | ICD-10-CM | POA: Diagnosis not present

## 2023-08-04 DIAGNOSIS — L02211 Cutaneous abscess of abdominal wall: Secondary | ICD-10-CM | POA: Diagnosis present

## 2023-08-04 DIAGNOSIS — R109 Unspecified abdominal pain: Secondary | ICD-10-CM | POA: Diagnosis not present

## 2023-08-04 DIAGNOSIS — F32A Depression, unspecified: Secondary | ICD-10-CM | POA: Diagnosis present

## 2023-08-04 DIAGNOSIS — N400 Enlarged prostate without lower urinary tract symptoms: Secondary | ICD-10-CM | POA: Diagnosis present

## 2023-08-04 DIAGNOSIS — I714 Abdominal aortic aneurysm, without rupture, unspecified: Secondary | ICD-10-CM | POA: Diagnosis not present

## 2023-08-04 DIAGNOSIS — Y828 Other medical devices associated with adverse incidents: Secondary | ICD-10-CM | POA: Diagnosis present

## 2023-08-04 DIAGNOSIS — I10 Essential (primary) hypertension: Secondary | ICD-10-CM | POA: Diagnosis present

## 2023-08-04 DIAGNOSIS — E785 Hyperlipidemia, unspecified: Secondary | ICD-10-CM | POA: Diagnosis not present

## 2023-08-04 DIAGNOSIS — Z96653 Presence of artificial knee joint, bilateral: Secondary | ICD-10-CM | POA: Diagnosis present

## 2023-08-04 DIAGNOSIS — Z801 Family history of malignant neoplasm of trachea, bronchus and lung: Secondary | ICD-10-CM

## 2023-08-04 DIAGNOSIS — Z79899 Other long term (current) drug therapy: Secondary | ICD-10-CM

## 2023-08-04 DIAGNOSIS — Z800 Family history of malignant neoplasm of digestive organs: Secondary | ICD-10-CM

## 2023-08-04 DIAGNOSIS — Z66 Do not resuscitate: Secondary | ICD-10-CM | POA: Diagnosis present

## 2023-08-04 DIAGNOSIS — R627 Adult failure to thrive: Secondary | ICD-10-CM | POA: Diagnosis present

## 2023-08-04 DIAGNOSIS — T8579XA Infection and inflammatory reaction due to other internal prosthetic devices, implants and grafts, initial encounter: Secondary | ICD-10-CM | POA: Diagnosis present

## 2023-08-04 DIAGNOSIS — K219 Gastro-esophageal reflux disease without esophagitis: Secondary | ICD-10-CM | POA: Diagnosis present

## 2023-08-04 DIAGNOSIS — E860 Dehydration: Secondary | ICD-10-CM | POA: Diagnosis present

## 2023-08-04 DIAGNOSIS — E7800 Pure hypercholesterolemia, unspecified: Secondary | ICD-10-CM | POA: Diagnosis present

## 2023-08-04 DIAGNOSIS — Z9049 Acquired absence of other specified parts of digestive tract: Secondary | ICD-10-CM

## 2023-08-04 DIAGNOSIS — L7632 Postprocedural hematoma of skin and subcutaneous tissue following other procedure: Secondary | ICD-10-CM | POA: Diagnosis present

## 2023-08-04 DIAGNOSIS — G8929 Other chronic pain: Secondary | ICD-10-CM | POA: Diagnosis present

## 2023-08-04 DIAGNOSIS — E46 Unspecified protein-calorie malnutrition: Secondary | ICD-10-CM | POA: Diagnosis present

## 2023-08-04 DIAGNOSIS — F0393 Unspecified dementia, unspecified severity, with mood disturbance: Secondary | ICD-10-CM | POA: Diagnosis present

## 2023-08-04 DIAGNOSIS — Z7982 Long term (current) use of aspirin: Secondary | ICD-10-CM | POA: Diagnosis not present

## 2023-08-04 DIAGNOSIS — R7989 Other specified abnormal findings of blood chemistry: Secondary | ICD-10-CM | POA: Diagnosis not present

## 2023-08-04 DIAGNOSIS — Z808 Family history of malignant neoplasm of other organs or systems: Secondary | ICD-10-CM | POA: Diagnosis not present

## 2023-08-04 DIAGNOSIS — Z8249 Family history of ischemic heart disease and other diseases of the circulatory system: Secondary | ICD-10-CM

## 2023-08-04 DIAGNOSIS — E876 Hypokalemia: Secondary | ICD-10-CM | POA: Diagnosis present

## 2023-08-04 DIAGNOSIS — K810 Acute cholecystitis: Secondary | ICD-10-CM | POA: Diagnosis not present

## 2023-08-04 DIAGNOSIS — E872 Acidosis, unspecified: Secondary | ICD-10-CM | POA: Diagnosis present

## 2023-08-04 DIAGNOSIS — L0291 Cutaneous abscess, unspecified: Principal | ICD-10-CM

## 2023-08-04 DIAGNOSIS — K811 Chronic cholecystitis: Secondary | ICD-10-CM | POA: Diagnosis not present

## 2023-08-04 DIAGNOSIS — R1011 Right upper quadrant pain: Secondary | ICD-10-CM | POA: Diagnosis present

## 2023-08-04 DIAGNOSIS — I639 Cerebral infarction, unspecified: Secondary | ICD-10-CM | POA: Diagnosis present

## 2023-08-04 LAB — COMPREHENSIVE METABOLIC PANEL
ALT: 12 U/L (ref 0–44)
AST: 20 U/L (ref 15–41)
Albumin: 2.7 g/dL — ABNORMAL LOW (ref 3.5–5.0)
Alkaline Phosphatase: 94 U/L (ref 38–126)
Anion gap: 13 (ref 5–15)
BUN: 18 mg/dL (ref 8–23)
CO2: 20 mmol/L — ABNORMAL LOW (ref 22–32)
Calcium: 8.6 mg/dL — ABNORMAL LOW (ref 8.9–10.3)
Chloride: 106 mmol/L (ref 98–111)
Creatinine, Ser: 0.86 mg/dL (ref 0.61–1.24)
GFR, Estimated: >60 mL/min (ref >60)
Glucose, Bld: 130 mg/dL — ABNORMAL HIGH (ref 70–99)
Potassium: 3.6 mmol/L (ref 3.5–5.1)
Sodium: 139 mmol/L (ref 135–145)
Total Bilirubin: 0.7 mg/dL (ref 0.0–1.2)
Total Protein: 7.2 g/dL (ref 6.5–8.1)

## 2023-08-04 LAB — CBC WITH DIFFERENTIAL/PLATELET
Abs Immature Granulocytes: 0.05 10*3/uL (ref 0.00–0.07)
Basophils Absolute: 0 10*3/uL (ref 0.0–0.1)
Basophils Relative: 0 %
Eosinophils Absolute: 0.2 10*3/uL (ref 0.0–0.5)
Eosinophils Relative: 2 %
HCT: 34.7 % — ABNORMAL LOW (ref 39.0–52.0)
Hemoglobin: 10.9 g/dL — ABNORMAL LOW (ref 13.0–17.0)
Immature Granulocytes: 1 %
Lymphocytes Relative: 12 %
Lymphs Abs: 1.3 10*3/uL (ref 0.7–4.0)
MCH: 28.1 pg (ref 26.0–34.0)
MCHC: 31.4 g/dL (ref 30.0–36.0)
MCV: 89.4 fL (ref 80.0–100.0)
Monocytes Absolute: 0.7 10*3/uL (ref 0.1–1.0)
Monocytes Relative: 7 %
Neutro Abs: 8.6 10*3/uL — ABNORMAL HIGH (ref 1.7–7.7)
Neutrophils Relative %: 78 %
Platelets: 373 10*3/uL (ref 150–400)
RBC: 3.88 MIL/uL — ABNORMAL LOW (ref 4.22–5.81)
RDW: 16.1 % — ABNORMAL HIGH (ref 11.5–15.5)
WBC: 11 10*3/uL — ABNORMAL HIGH (ref 4.0–10.5)
nRBC: 0 % (ref 0.0–0.2)

## 2023-08-04 LAB — LACTIC ACID, PLASMA
Lactic Acid, Venous: 3.3 mmol/L (ref 0.5–1.9)
Lactic Acid, Venous: 2.1 mmol/L (ref 0.5–1.9)

## 2023-08-04 LAB — APTT: aPTT: 37 s — ABNORMAL HIGH (ref 24–36)

## 2023-08-04 LAB — PROTIME-INR
INR: 1.3 — ABNORMAL HIGH (ref 0.8–1.2)
Prothrombin Time: 16.4 s — ABNORMAL HIGH (ref 11.4–15.2)

## 2023-08-04 MED ORDER — IOHEXOL 300 MG/ML  SOLN
100.0000 mL | Freq: Once | INTRAMUSCULAR | Status: AC | PRN
  Administered 2023-08-04: 100 mL via INTRAVENOUS

## 2023-08-04 MED ORDER — OXYCODONE-ACETAMINOPHEN 5-325 MG PO TABS: 1.0000 | ORAL_TABLET | ORAL | Status: DC | PRN

## 2023-08-04 MED ORDER — ASPIRIN 81 MG PO CHEW
81.0000 mg | CHEWABLE_TABLET | Freq: Every day | ORAL | Status: DC
  Administered 2023-08-05 – 2023-08-09 (×5): 81 mg via ORAL
  Filled 2023-08-04 (×5): qty 1

## 2023-08-04 MED ORDER — ONDANSETRON HCL 4 MG/2ML IJ SOLN
4.0000 mg | Freq: Three times a day (TID) | INTRAMUSCULAR | Status: DC | PRN
  Administered 2023-08-09: 4 mg via INTRAVENOUS
  Filled 2023-08-04: qty 2

## 2023-08-04 MED ORDER — ACETAMINOPHEN 325 MG PO TABS
650.0000 mg | ORAL_TABLET | Freq: Four times a day (QID) | ORAL | Status: DC | PRN
  Filled 2023-08-04 (×2): qty 2

## 2023-08-04 MED ORDER — LACTATED RINGERS IV SOLN: INTRAVENOUS | Status: AC

## 2023-08-04 MED ORDER — MORPHINE SULFATE (PF) 2 MG/ML IV SOLN: 0.5000 mg | INTRAVENOUS | Status: DC | PRN

## 2023-08-04 MED ORDER — SODIUM CHLORIDE 0.9 % IV SOLN
2.0000 g | INTRAVENOUS | Status: DC
  Administered 2023-08-05 – 2023-08-08 (×4): 2 g via INTRAVENOUS
  Filled 2023-08-04 (×5): qty 20

## 2023-08-04 MED ORDER — METRONIDAZOLE 500 MG/100ML IV SOLN
500.0000 mg | Freq: Two times a day (BID) | INTRAVENOUS | Status: DC
  Administered 2023-08-05 – 2023-08-09 (×9): 500 mg via INTRAVENOUS
  Filled 2023-08-04 (×10): qty 100

## 2023-08-04 MED ORDER — LACTATED RINGERS IV BOLUS
1000.0000 mL | Freq: Once | INTRAVENOUS | Status: AC
  Administered 2023-08-04: 1000 mL via INTRAVENOUS

## 2023-08-04 MED ORDER — ATORVASTATIN CALCIUM 20 MG PO TABS
40.0000 mg | ORAL_TABLET | Freq: Every day | ORAL | Status: DC
  Administered 2023-08-05 – 2023-08-09 (×5): 40 mg via ORAL
  Filled 2023-08-04 (×5): qty 2

## 2023-08-04 MED ORDER — PANTOPRAZOLE SODIUM 40 MG PO TBEC
40.0000 mg | DELAYED_RELEASE_TABLET | Freq: Every day | ORAL | Status: DC
  Administered 2023-08-05 – 2023-08-09 (×5): 40 mg via ORAL
  Filled 2023-08-04 (×5): qty 1

## 2023-08-04 MED ORDER — SERTRALINE HCL 50 MG PO TABS
25.0000 mg | ORAL_TABLET | Freq: Every day | ORAL | Status: DC
  Administered 2023-08-05 – 2023-08-09 (×5): 25 mg via ORAL
  Filled 2023-08-04 (×5): qty 1

## 2023-08-04 MED ORDER — HYDRALAZINE HCL 20 MG/ML IJ SOLN
5.0000 mg | INTRAMUSCULAR | Status: DC | PRN
  Filled 2023-08-04: qty 1

## 2023-08-04 MED ORDER — TAMSULOSIN HCL 0.4 MG PO CAPS
0.4000 mg | ORAL_CAPSULE | Freq: Every day | ORAL | Status: DC
  Administered 2023-08-05 – 2023-08-09 (×5): 0.4 mg via ORAL
  Filled 2023-08-04 (×5): qty 1

## 2023-08-04 MED ORDER — VITAMIN B-12 1000 MCG PO TABS
1000.0000 ug | ORAL_TABLET | Freq: Every day | ORAL | Status: DC
  Administered 2023-08-05 – 2023-08-09 (×5): 1000 ug via ORAL
  Filled 2023-08-04 (×2): qty 1
  Filled 2023-08-04: qty 2
  Filled 2023-08-04 (×2): qty 1

## 2023-08-04 MED ORDER — METRONIDAZOLE 500 MG/100ML IV SOLN
500.0000 mg | Freq: Once | INTRAVENOUS | Status: AC
  Administered 2023-08-04: 500 mg via INTRAVENOUS
  Filled 2023-08-04: qty 100

## 2023-08-04 MED ORDER — SODIUM CHLORIDE 0.9 % IV SOLN
2.0000 g | Freq: Once | INTRAVENOUS | Status: AC
  Administered 2023-08-04: 2 g via INTRAVENOUS
  Filled 2023-08-04: qty 20

## 2023-08-04 NOTE — ED Triage Notes (Signed)
 First Nurse Note: Patient to ED via [facility] from [facility]. Pt had cholecystomy possibly removed approx 2-3 weeks ago. Has drainage with distention and swelling to site. At baseline per facility.  EMS VS WNL

## 2023-08-04 NOTE — ED Provider Notes (Signed)
 [facility] Provider Note    Event Date/Time   First MD Initiated Contact with Patient 08/04/23 1505     (approximate)   History   Post-op Problem   HPI  Thomas [name] is a 89 y.o. male with a history of of cholecystitis, treated with cholecystotomy tube, patient apparently had multiple episodes where he pulled the tube out, last occurrence was about 2 weeks ago.  At that time it was not reinserted.  He presents with some right upper quadrant abdominal discomfort and reports of some leakage of fluid from the site.     Physical Exam   Triage Vital Signs: ED Triage Vitals  Encounter Vitals Group     BP 08/04/23 1347 (!) 103/56     Systolic BP Percentile --      Diastolic BP Percentile --      Pulse Rate 08/04/23 1344 (!) 58     Resp 08/04/23 1344 16     Temp 08/04/23 1344 98.6 F (37 C)     Temp Source 08/04/23 1344 Oral     SpO2 08/04/23 1344 92 %     Weight 08/04/23 1347 65.8 kg (145 lb)     Height 08/04/23 1347 1.727 m (5\' 8" )     Head Circumference --      Peak Flow --      Pain Score 08/04/23 1347 0     Pain Loc --      Pain Education --      Exclude from Growth Chart --     Most recent vital signs: Vitals:   08/04/23 1631 08/04/23 1632  BP: 111/68   Pulse: 85 85  Resp: 20   Temp:    SpO2: 95% 95%     General: Awake, no distress.  CV:  Good peripheral perfusion.  Resp:  Normal effort.  Abd:  No distention.  Other:  Mild tenderness in the right upper quadrant, small amount of pinkish fluid discharge from cholecystotomy site   ED Results / Procedures / Treatments   Labs (all labs ordered are listed, but only abnormal results are displayed) Labs Reviewed  LACTIC ACID, PLASMA - Abnormal; Notable for the following components:      Result Value   Lactic Acid, Venous 3.3 (*)    All other components within normal limits  COMPREHENSIVE METABOLIC PANEL - Abnormal; Notable for the following components:   CO2 20 (*)    Glucose,  Bld 130 (*)    Calcium 8.6 (*)    Albumin 2.7 (*)    All other components within normal limits  CBC WITH DIFFERENTIAL/PLATELET - Abnormal; Notable for the following components:   WBC 11.0 (*)    RBC 3.88 (*)    Hemoglobin 10.9 (*)    HCT 34.7 (*)    RDW 16.1 (*)    Neutro Abs 8.6 (*)    All other components within normal limits  CULTURE, BLOOD (SINGLE)  LACTIC ACID, PLASMA  URINALYSIS, W/ REFLEX TO CULTURE (INFECTION SUSPECTED)     EKG     RADIOLOGY CT scan demonstrates possible phlegmon of the right upper quadrant area    PROCEDURES:  Critical Care performed:   Procedures   MEDICATIONS ORDERED IN ED: Medications  lactated ringers infusion ( Intravenous New Bag/Given 08/04/23 1837)  cefTRIAXone (ROCEPHIN) 2 g in sodium chloride 0.9 % 100 mL IVPB (2 g Intravenous New Bag/Given 08/04/23 1836)  metroNIDAZOLE (FLAGYL) IVPB 500 mg (has no administration in time range)  iohexol (OMNIPAQUE) 300 MG/ML solution 100 mL (100 mLs Intravenous Contrast Given 08/04/23 1611)     IMPRESSION / MDM / ASSESSMENT AND PLAN / ED COURSE  I reviewed the triage vital signs and the nursing notes. Patient's presentation is most consistent with acute presentation with potential threat to life or bodily function.  Patient presents with increased pain from cholecystotomy site, tube has been out for about 2 weeks.  His white blood cell count is mildly elevated as is lactic acid however he is afebrile and not tachycardic.  Currently doubt sepsis  Will obtain CT abdomen pelvis to evaluate further  CT scan demonstrates possible phlegmon, given elevated lactic acid and elevated white blood cell count, will treat with IV antibiotics, have consulted IR to evaluate whether new tube is needed, have consulted medicine for admission for possible sepsis      FINAL CLINICAL IMPRESSION(S) / ED DIAGNOSES   Final diagnoses:  Phlegmon  Elevated lactic acid level     Rx / DC Orders   ED Discharge  Orders     None        Note:  This document was prepared using Dragon voice recognition software and may include unintentional dictation errors.   Jene Every, MD 08/04/23 1900

## 2023-08-04 NOTE — ED Notes (Signed)
Called lab to draw blood.

## 2023-08-04 NOTE — ED Triage Notes (Signed)
 Pt to ED from [facility] via [facility] for post op problem, had cholecystectomy 2-3 weeks ago. Per first nurse note staff noted drainage and swelling to RUQ which is currently covered with dry dressing. Skin around dressing is not red or swollen. Pt denies pain or any complaints. Pt is oriented. HOH.

## 2023-08-04 NOTE — Consult Note (Signed)
 CODE SEPSIS - PHARMACY COMMUNICATION  **Broad Spectrum Antibiotics should be administered within 1 hour of Sepsis diagnosis**  Time Code Sepsis Called/Page Received: 1813  Antibiotics Ordered: ceftriaxone and Flagyl  Time of 1st antibiotic administration: 1836  Additional action taken by pharmacy: N/A  Barrie Folk ,PharmD Clinical Pharmacist  08/04/2023  6:14 PM

## 2023-08-04 NOTE — Sepsis Progress Note (Signed)
 eLink is following this Code Sepsis.

## 2023-08-04 NOTE — H&P (Signed)
 History and Physical    Thomas [name] ZOX:[id number] DOB: 1930/06/15 DOA: 08/04/2023  Referring MD/NP/PA:   PCP: Barbette Reichmann, MD   Patient coming from:  The patient is coming from home.     Chief Complaint: Abdominal pain  HPI: Thomas [name] is a 89 y.o. male with medical history significant of HTN, HLD, stroke, GERD, depression, BPH, carotid artery stenosis, cholecystitis, who presents with abdominal pain.  Patient had prolonged hospitalization December 2024 due to stroke and cholecystitis. Pt was s/p of percutaneous biliary drain with percutaneous cholecystostomy tube by IR on 12/7. Per pt and his son-in-law at the bedside, his cholecystostomy tube was pulled out and replaced 4 times, with last time around 10 days ago. At that time it was not reinserted.  Patient states that he developed swelling, pain, bloody fluid leaking from the site.  The pain is constant, aching, moderate, nonradiating.  No fever or chills.  No nausea, vomiting, diarrhea.  No symptoms of UTI.  Patient does not have chest pain, cough, SOB.  Data reviewed independently and ED Course: pt was found to have WBC 11.0, GFR> 60, temperature normal, blood pressure 116/68, heart rate 58, 85, RR 20, oxygen saturation 95% on room air.  Patient is admitted to MedSurg bed as inpatient.  Message sent to Dr. Loreta Ave of IR for consult.  CT abdomen/pelvis: 1. Interval removal of the percutaneous cholecystostomy with an ill-defined inflammatory area in the right anterior upper abdominal wall which may represent a hematoma or a phlegmon. A fistulous tract extending from the gallbladder fundus to the anterior abdominal wall and skin is not excluded. 2. No bowel obstruction. 3. A 4.1 cm infrarenal abdominal aortic aneurysm. Recommend follow-up CT or MR as appropriate in 12 months and referral to or continued care with vascular specialist. (Ref.: J Vasc Surg. 2018; 67:2-77 and J Am Coll Radiol 2013;10(10):789-794.)   EKG: Not done  in ED, will get one.      Review of Systems:   General: no fevers, chills, no body weight gain, has poor appetite, has fatigue HEENT: no blurry vision, hearing changes or sore throat Respiratory: no dyspnea, coughing, wheezing CV: no chest pain, no palpitations GI: no nausea, vomiting, has abdominal pain, no diarrhea, constipation GU: no dysuria, burning on urination, increased urinary frequency, hematuria  Ext: no leg edema Neuro: no unilateral weakness, numbness, or tingling, no vision change or hearing loss Skin: no rash, no skin tear. MSK: No muscle spasm, no deformity, no limitation of range of movement in spin Heme: No easy bruising.  Travel history: No recent long distant travel.   Allergy: No Known Allergies  Past Medical History:  Diagnosis Date   Arthritis    Carpal tunnel syndrome on both sides    GERD (gastroesophageal reflux disease)    Hyperlipidemia    Hypertension     Past Surgical History:  Procedure Laterality Date   CHOLECYSTECTOMY     3/25   ENDARTERECTOMY Right 03/19/2023   Procedure: ENDARTERECTOMY CAROTID;  Surgeon: Renford Dills, MD;  Location: [facility] ORS;  Service: Vascular;  Laterality: Right;   INGUINAL HERNIA REPAIR  2001   IR EXCHANGE BILIARY DRAIN  05/16/2023   IR EXCHANGE BILIARY DRAIN  05/28/2023   IR PERC CHOLECYSTOSTOMY  04/17/2023   REPLACEMENT TOTAL KNEE Bilateral     Social History:  reports that he has never smoked. He has never used smokeless tobacco. He reports that he does not drink alcohol and does not use drugs.  Family  History:  Family History  Problem Relation Age of Onset   Cancer Sister        Liver cancer   Melanoma Sister    Cancer Brother        Pancreatic   Cancer Brother        Lung cancer   Heart disease Brother      Prior to Admission medications   Medication Sig Start Date End Date Taking? Authorizing Provider  acetaminophen (TYLENOL) 325 MG tablet Take 2 tablets (650 mg total) by mouth every 6 (six)  hours as needed for fever, headache or moderate pain. Patient taking differently: Take 325 mg by mouth every 6 (six) hours as needed for fever, headache or moderate pain (pain score 4-6). 05/31/20   Alford Highland, MD  aspirin 81 MG chewable tablet Chew 1 tablet (81 mg total) by mouth daily. 04/17/23   Gillis Santa, MD  atorvastatin (LIPITOR) 40 MG tablet Take 1 tablet (40 mg total) by mouth daily. 04/17/23   Gillis Santa, MD  bisacodyl (DULCOLAX) 10 MG suppository Place 1 suppository (10 mg total) rectally daily as needed for severe constipation. 02/19/23   Gillis Santa, MD  bisacodyl (DULCOLAX) 5 MG EC tablet Take 2 tablets (10 mg total) by mouth at bedtime. 02/19/23   Gillis Santa, MD  cyanocobalamin (VITAMIN B12) 1000 MCG tablet Take 1,000 mcg by mouth daily. 09/19/21   [provider]  divalproex (DEPAKOTE ER) 250 MG 24 hr tablet Take 250 mg by mouth daily.    [provider]  finasteride (PROSCAR) 5 MG tablet Take 1 tablet (5 mg total) by mouth daily. Patient not taking: Reported on 06/03/2023 05/30/23   Michiel Cowboy A, PA-C  omeprazole (PRILOSEC OTC) 20 MG tablet Take 20 mg by mouth daily.    [provider]  pantoprazole (PROTONIX) 40 MG tablet Take 1 tablet (40 mg total) by mouth daily. Patient not taking: Reported on 06/03/2023 02/20/23   Gillis Santa, MD  polyethylene glycol (MIRALAX / GLYCOLAX) 17 g packet Take 17 g by mouth 2 (two) times daily. 04/19/23   Gillis Santa, MD  polyvinyl alcohol (LIQUIFILM TEARS) 1.4 % ophthalmic solution Place 2 drops into both eyes as needed for dry eyes. 04/19/23   Gillis Santa, MD  sertraline (ZOLOFT) 25 MG tablet Take 1 tablet by mouth daily. 03/05/23   [provider]  tamsulosin (FLOMAX) 0.4 MG CAPS capsule Take 1 capsule (0.4 mg total) by mouth daily after supper. 04/16/23   Gillis Santa, MD  tamsulosin (FLOMAX) 0.4 MG CAPS capsule Take 1 capsule (0.4 mg total) by mouth daily. 05/30/23   Harle Battiest, PA-C     Physical Exam: Vitals:   08/04/23 1347 08/04/23 1631 08/04/23 1632 08/04/23 1850  BP: (!) 103/56 111/68    Pulse:  85 85   Resp:  20    Temp:    98.2 F (36.8 C)  TempSrc:    Oral  SpO2:  95% 95%   Weight: 65.8 kg     Height: 5\' 8"  (1.727 m)      General: Not in acute distress HEENT:       Eyes: PERRL, EOMI, no jaundice       ENT: No discharge from the ears and nose, no pharynx injection, no tonsillar enlargement.        Neck: No JVD, no bruit, no mass felt. Heme: No neck lymph node enlargement. Cardiac: S1/S2, RRR, No murmurs, No gallops or rubs. Respiratory: No rales, wheezing, rhonchi  or rubs. GI: Soft, nondistended, nontender, no rebound pain, no organomegaly, BS present. GU: No hematuria Ext: No pitting leg edema bilaterally. 1+DP/PT pulse bilaterally. Musculoskeletal: No joint deformities, No joint redness or warmth, no limitation of ROM in spin. Skin: No rashes.  Neuro: Alert, oriented X3, cranial nerves II-XII grossly intact, moves all extremities normally. Muscle strength 5/5 in all extremities, sensation to light touch intact. Brachial reflex 2+ bilaterally. Knee reflex 1+ bilaterally. Negative Babinski's sign. Normal finger to nose test. Psych: Patient is not psychotic, no suicidal or hemocidal ideation.  Labs on Admission: I have personally reviewed following labs and imaging studies  CBC: Recent Labs  Lab 08/04/23 1424  WBC 11.0*  NEUTROABS 8.6*  HGB 10.9*  HCT 34.7*  MCV 89.4  PLT 373   Basic Metabolic Panel: Recent Labs  Lab 08/04/23 1424  NA 139  K 3.6  CL 106  CO2 20*  GLUCOSE 130*  BUN 18  CREATININE 0.86  CALCIUM 8.6*   GFR: Estimated Creatinine Clearance: 53.1 mL/min (by C-G formula based on SCr of 0.86 mg/dL). Liver Function Tests: Recent Labs  Lab 08/04/23 1424  AST 20  ALT 12  ALKPHOS 94  BILITOT 0.7  PROT 7.2  ALBUMIN 2.7*   No results for input(s): "LIPASE", "AMYLASE" in the last 168 hours. No results for input(s):  "AMMONIA" in the last 168 hours. Coagulation Profile: Recent Labs  Lab 08/04/23 1957  INR 1.3*   Cardiac Enzymes: No results for input(s): "CKTOTAL", "CKMB", "CKMBINDEX", "TROPONINI" in the last 168 hours. BNP (last 3 results) No results for input(s): "PROBNP" in the last 8760 hours. HbA1C: No results for input(s): "HGBA1C" in the last 72 hours. CBG: No results for input(s): "GLUCAP" in the last 168 hours. Lipid Profile: No results for input(s): "CHOL", "HDL", "LDLCALC", "TRIG", "CHOLHDL", "LDLDIRECT" in the last 72 hours. Thyroid Function Tests: No results for input(s): "TSH", "T4TOTAL", "FREET4", "T3FREE", "THYROIDAB" in the last 72 hours. Anemia Panel: No results for input(s): "VITAMINB12", "FOLATE", "FERRITIN", "TIBC", "IRON", "RETICCTPCT" in the last 72 hours. Urine analysis:    Component Value Date/Time   COLORURINE RED (A) 04/22/2023 0240   APPEARANCEUR TURBID (A) 04/22/2023 0240   APPEARANCEUR Clear 10/19/2011 0819   LABSPEC  04/22/2023 0240    TEST NOT REPORTED DUE TO COLOR INTERFERENCE OF URINE PIGMENT   LABSPEC 1.016 10/19/2011 0819   PHURINE  04/22/2023 0240    TEST NOT REPORTED DUE TO COLOR INTERFERENCE OF URINE PIGMENT   GLUCOSEU (A) 04/22/2023 0240    TEST NOT REPORTED DUE TO COLOR INTERFERENCE OF URINE PIGMENT   GLUCOSEU Negative 10/19/2011 0819   HGBUR (A) 04/22/2023 0240    TEST NOT REPORTED DUE TO COLOR INTERFERENCE OF URINE PIGMENT   BILIRUBINUR (A) 04/22/2023 0240    TEST NOT REPORTED DUE TO COLOR INTERFERENCE OF URINE PIGMENT   BILIRUBINUR Negative 10/19/2011 0819   KETONESUR (A) 04/22/2023 0240    TEST NOT REPORTED DUE TO COLOR INTERFERENCE OF URINE PIGMENT   PROTEINUR (A) 04/22/2023 0240    TEST NOT REPORTED DUE TO COLOR INTERFERENCE OF URINE PIGMENT   NITRITE (A) 04/22/2023 0240    TEST NOT REPORTED DUE TO COLOR INTERFERENCE OF URINE PIGMENT   LEUKOCYTESUR (A) 04/22/2023 0240    TEST NOT REPORTED DUE TO COLOR INTERFERENCE OF URINE PIGMENT    LEUKOCYTESUR Negative 10/19/2011 0819   Sepsis Labs: @LABRCNTIP (procalcitonin:4,lacticidven:4) )No results found for this or any previous visit (from the past 240 hours).   Radiological Exams on Admission:  Assessment/Plan Principal Problem:   Abdominal pain Active Problems:   Elevated lactic acid level   Hypertension   HLD (hyperlipidemia)   Normocytic anemia   AAA (abdominal aortic aneurysm) (HCC)   Stroke (HCC)   BPH (benign prostatic hyperplasia)   Depression   Assessment and Plan:  Abdominal pain: Patient has tenderness, swelling, bloody drainage from previous cholecystostomy tube.  Patient has leukocytosis with WBC 11.0, indicating possible infection.  Message sent to Dr. Maryann Alar of IR for consult.   -Admitted to MedSurg bed as inpatient -N.p.o. after midnight -Antibiotics: Rocephin plus Flagyl -Blood culture -As needed Percocet, morphine, Tylenol for pain -As needed Zofran -IV fluid: 1 L normal saline, then 75 cc/h  Elevated lactic acid level: Lactic acid 3.3, patient does not meets criteria for sepsis (WBC 11.0, temperature normal, heart rate 58, RR 20).  Likely due to dehydration. -IV fluid as above -Trend lactic acid level  Hypertension: Blood pressure 116/68.  Patient is not taking medications currently. -IV hydralazine as needed  HLD (hyperlipidemia) -Lipitor  Normocytic anemia: Hemoglobin stable 10.9 which was also 10.9 on 07/21/2023 -Follow-up with CBC  AAA (abdominal aortic aneurysm) [facility]): This is incidental findings by CT scan. -Follow-up with PCP  Stroke (HCC) -Aspirin, Lipitor  BPH (benign prostatic hyperplasia) -Flomax  Depression -Zoloft        DVT ppx: SCD  Code Status: DNR  Family Communication:  Yes, patient's son-in-law    at bed side.   Disposition Plan:  Anticipate discharge back to previous environment  Consults called: Message sent to Dr. Loreta Ave for IR for consult  Admission status and Level of care: Med-Surg:   as inpt        Dispo: The patient is from: SNF              Anticipated d/c is to: SNF              Anticipated d/c date is: 2 days              Patient currently is not medically stable to d/c.    Severity of Illness:  The appropriate patient status for this patient is INPATIENT. Inpatient status is judged to be reasonable and necessary in order to provide the required intensity of service to ensure the patient's safety. The patient's presenting symptoms, physical exam findings, and initial radiographic and laboratory data in the context of their chronic comorbidities is felt to place them at high risk for further clinical deterioration. Furthermore, it is not anticipated that the patient will be medically stable for discharge from the hospital within 2 midnights of admission.   * I certify that at the point of admission it is my clinical judgment that the patient will require inpatient hospital care spanning beyond 2 midnights from the point of admission due to high intensity of service, high risk for further deterioration and high frequency of surveillance required.*       Date of Service 08/05/2023    Lorretta Harp [facility]   If 7PM-7AM, please contact night-coverage [email or web address] 08/05/2023, 12:43 AM

## 2023-08-04 NOTE — ED Notes (Signed)
 Critical Result: Lactic Acid 3.3  Ray, MD made aware

## 2023-08-05 ENCOUNTER — Encounter: Payer: Self-pay | Admitting: Internal Medicine

## 2023-08-05 DIAGNOSIS — R7989 Other specified abnormal findings of blood chemistry: Secondary | ICD-10-CM | POA: Diagnosis not present

## 2023-08-05 DIAGNOSIS — I10 Essential (primary) hypertension: Secondary | ICD-10-CM | POA: Diagnosis not present

## 2023-08-05 DIAGNOSIS — D649 Anemia, unspecified: Secondary | ICD-10-CM | POA: Diagnosis not present

## 2023-08-05 DIAGNOSIS — I714 Abdominal aortic aneurysm, without rupture, unspecified: Secondary | ICD-10-CM

## 2023-08-05 DIAGNOSIS — R1011 Right upper quadrant pain: Secondary | ICD-10-CM | POA: Diagnosis not present

## 2023-08-05 DIAGNOSIS — K811 Chronic cholecystitis: Secondary | ICD-10-CM

## 2023-08-05 LAB — URINALYSIS, W/ REFLEX TO CULTURE (INFECTION SUSPECTED)
Bilirubin Urine: NEGATIVE
Glucose, UA: NEGATIVE mg/dL
Hgb urine dipstick: NEGATIVE
Ketones, ur: 5 mg/dL — AB
Nitrite: NEGATIVE
Protein, ur: NEGATIVE mg/dL
Specific Gravity, Urine: 1.032 — ABNORMAL HIGH (ref 1.005–1.030)
pH: 5 (ref 5.0–8.0)

## 2023-08-05 LAB — CBC
HCT: 26.2 % — ABNORMAL LOW (ref 39.0–52.0)
Hemoglobin: 8.1 g/dL — ABNORMAL LOW (ref 13.0–17.0)
MCH: 28.1 pg (ref 26.0–34.0)
MCHC: 30.9 g/dL (ref 30.0–36.0)
MCV: 91 fL (ref 80.0–100.0)
Platelets: 281 10*3/uL (ref 150–400)
RBC: 2.88 MIL/uL — ABNORMAL LOW (ref 4.22–5.81)
RDW: 16.1 % — ABNORMAL HIGH (ref 11.5–15.5)
WBC: 6.4 10*3/uL (ref 4.0–10.5)
nRBC: 0 % (ref 0.0–0.2)

## 2023-08-05 LAB — MRSA NEXT GEN BY PCR, NASAL: MRSA by PCR Next Gen: DETECTED — AB

## 2023-08-05 LAB — BASIC METABOLIC PANEL WITH GFR
Anion gap: 10 (ref 5–15)
BUN: 14 mg/dL (ref 8–23)
CO2: 22 mmol/L (ref 22–32)
Calcium: 8.2 mg/dL — ABNORMAL LOW (ref 8.9–10.3)
Chloride: 107 mmol/L (ref 98–111)
Creatinine, Ser: 0.69 mg/dL (ref 0.61–1.24)
GFR, Estimated: >60 mL/min (ref >60)
Glucose, Bld: 84 mg/dL (ref 70–99)
Potassium: 3.1 mmol/L — ABNORMAL LOW (ref 3.5–5.1)
Sodium: 139 mmol/L (ref 135–145)

## 2023-08-05 LAB — LACTIC ACID, PLASMA: Lactic Acid, Venous: 0.8 mmol/L (ref 0.5–1.9)

## 2023-08-05 MED ORDER — POTASSIUM CHLORIDE 10 MEQ/100ML IV SOLN
10.0000 meq | INTRAVENOUS | Status: AC
Start: 2023-08-05 — End: 2023-08-05
  Administered 2023-08-05 (×3): 10 meq via INTRAVENOUS
  Filled 2023-08-05 (×2): qty 100

## 2023-08-05 NOTE — Progress Notes (Signed)
 Patient with previous percutaneous cholecystostomy tube in place which has pulled out and replaced 4 times and has been pulled out again and request for replacement. Dr. Milford Cage has reviewed the CT, given patients vitals are stable, wbc is wnl today and he is afebrile there is not an acute need to replace the drain. If the patient becomes unstable, septic or develops acute symptoms please reach out to IR and at that time replacement may be indicated. This was discussed today with the hospitalist.   Berneta Levins, PA-C 08/05/2023, 3:57 PM

## 2023-08-05 NOTE — TOC Initial Note (Signed)
 Transition of Care [facility]) - Initial/Assessment Note    Patient Details  Name: Thomas [name] MRN: [id number] Date of Birth: 12-14-1930  [facility] CM/SW Contact:    Elberta Fortis, RN Phone Number: 08/05/2023, 4:19 PM  Clinical Narrative:  Met with pt in his ED room. He's pleasantly confused and unable to tell this RN much about his living situation. He said, "I live here", and also couldn't say how he ambulates. Chart review indicates he came from a SNF and will most likely be Dc'd back to SNF. TOC to continue to follow.                  Expected Discharge Plan: Skilled Nursing Facility Barriers to Discharge: Continued Medical Work up   Patient Goals and CMS Choice            Expected Discharge Plan and Services   Discharge Planning Services: CM Consult   Living arrangements for the past 2 months: Skilled Nursing Facility                                      Prior Living Arrangements/Services Living arrangements for the past 2 months: Skilled Nursing Facility Lives with:: Facility Resident Patient language and need for interpreter reviewed:: Yes Do you feel safe going back to the place where you live?: Yes      Need for Family Participation in Patient Care: Yes (Comment) Care giver support system in place?: Yes (comment) (Lives at SNF)   Criminal Activity/Legal Involvement Pertinent to Current Situation/Hospitalization: No - Comment as needed  Activities of Daily Living   ADL Screening (condition at time of admission) Independently performs ADLs?: No Does the patient have a NEW difficulty with bathing/dressing/toileting/self-feeding that is expected to last >3 days?: No Does the patient have a NEW difficulty with getting in/out of bed, walking, or climbing stairs that is expected to last >3 days?: No Does the patient have a NEW difficulty with communication that is expected to last >3 days?: No Is the patient deaf or have difficulty  hearing?: No Does the patient have difficulty seeing, even when wearing glasses/contacts?: No Does the patient have difficulty concentrating, remembering, or making decisions?: Yes  Permission Sought/Granted                  Emotional Assessment Appearance:: Appears stated age Attitude/Demeanor/Rapport: Gracious Affect (typically observed): Appropriate, Calm, Pleasant Orientation: : Oriented to Self (Pt believes he's at home) Alcohol / Substance Use: Never Used Psych Involvement: No (comment)  Admission diagnosis:  Phlegmon [L02.91] History of cholecystectomy [Z90.49] Elevated lactic acid level [R79.89] Abdominal pain [R10.9] Patient Active Problem List   Diagnosis Date Noted   Abdominal pain 08/04/2023   Stroke (HCC) 08/04/2023   Elevated lactic acid level 08/04/2023   AAA (abdominal aortic aneurysm) (HCC) 08/04/2023   Normocytic anemia 08/04/2023   Carotid stenosis 06/02/2023   Hypokalemia 04/24/2023   Urinary retention due to benign prostatic hyperplasia 04/23/2023   Gross hematuria 04/23/2023   Sepsis due to gram-negative UTI (HCC) 04/22/2023   Acute kidney injury superimposed on chronic kidney disease (HCC) 04/22/2023   Dyslipidemia 04/22/2023   GERD without esophagitis 04/22/2023   Depression 04/22/2023   BPH (benign prostatic hyperplasia) 04/22/2023   History of acute cholecystitis 04/22/2023   Cholecystostomy care (HCC) 04/22/2023   Acute cholecystitis 04/13/2023   Acute CVA (cerebrovascular accident) (HCC) 02/13/2023  Rhabdomyolysis 02/12/2023   Hypophosphatemia 05/22/2021   Traumatic rhabdomyolysis (HCC)    Neuropathy    Rhabdomyolysis due to COVID-19 05/19/2021   Generalized weakness    Acute respiratory failure with hypoxia (HCC)    Insomnia    Acute metabolic encephalopathy    COVID-19 virus infection 05/28/2020   GERD (gastroesophageal reflux disease)    Hypertension    HLD (hyperlipidemia)    Multiple lung nodules 12/05/2018   Mass of right  side of neck 01/27/2018   PCP:  Barbette Reichmann, MD Pharmacy:   [facility] [phone] - [place], [place] - [address] [place] [address] Phone: [phone] Fax: [phone]     Social Drivers of Health (SDOH) Social History: SDOH Screenings   Food Insecurity: Patient Unable To Answer (08/05/2023)  Housing: Patient Unable To Answer (08/05/2023)  Transportation Needs: Patient Unable To Answer (08/05/2023)  Utilities: Patient Unable To Answer (08/05/2023)  Financial Resource Strain: Low Risk  (10/12/2022)   Received from [facility]  Social Connections: Patient Unable To Answer (08/05/2023)  Tobacco Use: Low Risk  (08/05/2023)  Health Literacy: High Risk (03/12/2023)   Received from [facility]   SDOH Interventions:     Readmission Risk Interventions     No data to display

## 2023-08-05 NOTE — ED Notes (Signed)
 Assumed patient care at approximately 0725 and received report from the previous nurse. Patient was noted to be resting well.

## 2023-08-05 NOTE — ED Notes (Signed)
 Checked pt's adult brief for urinary or fecal incontinence.  Pt remains dry at this time.  Pt repositioned and provided with blankets for comfort.

## 2023-08-05 NOTE — Plan of Care (Signed)
  Problem: Clinical Measurements: Goal: Will remain free from infection Outcome: Progressing   Problem: Pain Managment: Goal: General experience of comfort will improve and/or be controlled Outcome: Progressing

## 2023-08-05 NOTE — Progress Notes (Signed)
 Progress Note   Patient: Thomas [name]:[id number] DOB: October 16, 1930 DOA: 08/04/2023     1 DOS: the patient was seen and examined on 08/05/2023   Brief hospital course: Thomas [name] is a 89 y.o. male with medical history significant of HTN, HLD, stroke, GERD, depression, BPH, carotid artery stenosis, cholecystitis, cholecystostomy tube pulled out and replaced 4 times, with last time 10 days ago when it was not reinserted presented with abdominal pain.   CT abdomen showed Interval removal of the percutaneous cholecystostomy with an ill-defined inflammatory area in the right anterior upper abdominal wall which may represent a hematoma or a phlegmon. A fistulous tract extending from the gallbladder fundus to the anterior abdominal wall and skin is not excluded.  IR advised there is not an acute need to replace the drain. Surgery team consulted per daughter request.  Assessment and Plan: RUQ Abdominal pain Chronic cholecystitis S/p perc cholecystostomy tube out since 10 days. Possible phlegmon Seen by IR no need for acute chole tube replacement. Continue IV antibiotic therapy. Discussed with daughter who wishes to get surgery consult, thinks he is getting worse off the tube and asks about surgery. Surgery team consulted (Dr. Maurine Minister to see him)  Elevated lactic acid level:  Lactic acid improved with IV hydration. Continue gentle IV fluids.  Hypokalemia- IV potassium supplements ordered. Monitor daily electrolytes.   Hypertension:  Blood pressure 116/68.   IV hydralazine as needed   HLD (hyperlipidemia) Continue Lipitor   Normocytic anemia:  Hemoglobin stable 10.9 which was also 10.9 on 07/21/2023.  AAA (abdominal aortic aneurysm) (HCC): This is incidental findings by CT scan. Follow-up with PCP with 1 year scan.   Stroke [facility]) Continue Aspirin, Lipitor   BPH (benign prostatic hyperplasia) Continue Flomax   Depression On Zoloft         Out of bed to chair. Incentive  spirometry. Nursing supportive care. Fall, aspiration precautions. Diet:  Diet Orders (From admission, onward)     Start     Ordered   08/05/23 0001  Diet NPO time specified Except for: Sips with Meds, Ice Chips  Diet effective midnight       Question Answer Comment  Except for Sips with Meds   Except for Ice Chips      08/04/23 1901           DVT prophylaxis: SCDs Start: 08/04/23 1935  Level of care: med-surg   Code Status: Limited: Do not attempt resuscitation (DNR) -DNR-LIMITED -Do Not Intubate/DNI   Subjective: Patient is seen and examined today morning. He is slow responding, but does not want me to touch RUQ dressing. Says he did not mess with drain. Drain dressing with some blood noted.   Physical Exam: Vitals:   08/05/23 0930 08/05/23 1100 08/05/23 1130 08/05/23 1540  BP: 111/68 111/61 114/60 128/75  Pulse: (!) 51 (!) 53 (!) 43 (!) 59  Resp:   18 20  Temp:   98 F (36.7 C) 97.8 F (36.6 C)  TempSrc:   Oral Oral  SpO2: 96% 96% 95% 97%  Weight:      Height:        General - Elderly Caucasian male, no apparent distress HEENT - PERRLA, EOMI, atraumatic head, non tender sinuses. Lung - Clear, diffuse rales, no rhonchi, wheezes. Heart - S1, S2 heard, no murmurs, rubs, trace pedal edema. Abdomen - Soft, RUQ tender, distended, bowel sounds good Neuro - Alert, awake and oriented x 3, non focal exam. Skin - Warm and  dry.  Data Reviewed:      Latest Ref Rng & Units 08/05/2023    4:24 AM 08/04/2023    2:24 PM 07/21/2023    8:53 AM  CBC  WBC 4.0 - 10.5 K/uL 6.4  11.0  8.6   Hemoglobin 13.0 - 17.0 g/dL 8.1  16.1  09.6   Hematocrit 39.0 - 52.0 % 26.2  34.7  34.5   Platelets 150 - 400 K/uL 281  373  256       Latest Ref Rng & Units 08/05/2023    4:24 AM 08/04/2023    2:24 PM 07/21/2023    8:53 AM  BMP  Glucose 70 - 99 mg/dL 84  045  409   BUN 8 - 23 mg/dL 14  18  18    Creatinine 0.61 - 1.24 mg/dL 8.11  9.14  7.82   Sodium 135 - 145 mmol/L 139  139  142    Potassium 3.5 - 5.1 mmol/L 3.1  3.6  3.7   Chloride 98 - 111 mmol/L 107  106  107   CO2 22 - 32 mmol/L 22  20  25    Calcium 8.9 - 10.3 mg/dL 8.2  8.6  9.3    CT ABDOMEN PELVIS W CONTRAST Result Date: 08/04/2023 CLINICAL DATA:  Right upper quadrant abdominal pain. History of percutaneous cholecystostomy. EXAM: CT ABDOMEN AND PELVIS WITH CONTRAST TECHNIQUE: Multidetector CT imaging of the abdomen and pelvis was performed using the standard protocol following bolus administration of intravenous contrast. RADIATION DOSE REDUCTION: This exam was performed according to the departmental dose-optimization program which includes automated exposure control, adjustment of the mA and/or kV according to patient size and/or use of iterative reconstruction technique. CONTRAST:  OMNIPAQUE IOHEXOL 300 MG/ML  SOLN COMPARISON:  CT abdomen pelvis dated 04/21/2023. FINDINGS: Lower chest: Bibasilar interstitial coarsening and atelectasis/scarring. No intra-abdominal free air or free fluid. Hepatobiliary: The liver is unremarkable. No biliary dilatation. The gallbladder is mildly distended. Noncalcified stone or sludge in the gallbladder. The previously seen percutaneous cholecystostomy is not visualized. There is stranding of the pericholecystic fat adjacent to the gallbladder fundus. There is thickening of the gallbladder fundus. Ill-defined inflammatory tissue extends from the gallbladder fundus to the anterior abdominal wall corresponding to the track of the cholecystostomy tube. There is an ill-defined 3.5 x 2.6 cm inflammatory area in the anterior right upper abdominal wall with a low attenuating central area extending to the skin. This may represent a small hematoma but a phlegmon/developing abscess is not excluded. Ultrasound may provide better evaluation. Pancreas: Unremarkable. No pancreatic ductal dilatation or surrounding inflammatory changes. Spleen: Normal in size without focal abnormality. Adrenals/Urinary  Tract: The adrenal glands are unremarkable. Bilateral renal cysts measure up to 6 cm in the inferior pole of the left kidney. There is no hydronephrosis on either side. There is symmetric enhancement and excretion of contrast by both kidneys. The visualized ureters appear unremarkable. The urinary bladder is minimally distended and grossly unremarkable. There is apparent diffuse thickening of the bladder wall which may be partly related to underdistention. Cystitis is not excluded. Correlation with urinalysis recommended. Stomach/Bowel: Small hiatal hernia. There is no bowel obstruction or active inflammation. No CT evidence of acute appendicitis. Vascular/Lymphatic: Advanced aortoiliac atherosclerotic disease. There is a 4.1 cm infrarenal abdominal aortic aneurysm. The IVC is unremarkable. No portal venous gas. There is no adenopathy. Reproductive: Mildly enlarged prostate gland with median lobe hypertrophy. Other: Small fat containing umbilical hernia. Musculoskeletal: Osteopenia with degenerative changes. No acute  osseous pathology. IMPRESSION: 1. Interval removal of the percutaneous cholecystostomy with an ill-defined inflammatory area in the right anterior upper abdominal wall which may represent a hematoma or a phlegmon. A fistulous tract extending from the gallbladder fundus to the anterior abdominal wall and skin is not excluded. 2. No bowel obstruction. 3. A 4.1 cm infrarenal abdominal aortic aneurysm. Recommend follow-up CT or MR as appropriate in 12 months and referral to or continued care with vascular specialist. (Ref.: J Vasc Surg. 2018; 67:2-77 and J Am Coll Radiol 2013;10(10):789-794.) Electronically Signed   By: Elgie Collard M.D.   On: 08/04/2023 17:51    Family Communication: no family at bedside. Discussed with daughter over phone, asked about surgical option.  Disposition: Status is: Inpatient Remains inpatient appropriate because: pain control, surgery eval.  Planned Discharge  Destination: Skilled nursing facility     Time spent: 40 minutes  Author: Marcelino Duster, MD 08/05/2023 4:44 PM Secure chat 7am to 7pm For on call review [email or web address].

## 2023-08-06 DIAGNOSIS — K810 Acute cholecystitis: Secondary | ICD-10-CM | POA: Diagnosis not present

## 2023-08-06 DIAGNOSIS — R1011 Right upper quadrant pain: Secondary | ICD-10-CM | POA: Diagnosis not present

## 2023-08-06 LAB — BASIC METABOLIC PANEL WITH GFR
Anion gap: 11 (ref 5–15)
BUN: 13 mg/dL (ref 8–23)
CO2: 21 mmol/L — ABNORMAL LOW (ref 22–32)
Calcium: 8.5 mg/dL — ABNORMAL LOW (ref 8.9–10.3)
Chloride: 109 mmol/L (ref 98–111)
Creatinine, Ser: 0.7 mg/dL (ref 0.61–1.24)
GFR, Estimated: >60 mL/min (ref >60)
Glucose, Bld: 65 mg/dL — ABNORMAL LOW (ref 70–99)
Potassium: 3.3 mmol/L — ABNORMAL LOW (ref 3.5–5.1)
Sodium: 141 mmol/L (ref 135–145)

## 2023-08-06 LAB — CBC
HCT: 30.5 % — ABNORMAL LOW (ref 39.0–52.0)
Hemoglobin: 9.6 g/dL — ABNORMAL LOW (ref 13.0–17.0)
MCH: 27.3 pg (ref 26.0–34.0)
MCHC: 31.5 g/dL (ref 30.0–36.0)
MCV: 86.6 fL (ref 80.0–100.0)
Platelets: 330 10*3/uL (ref 150–400)
RBC: 3.52 MIL/uL — ABNORMAL LOW (ref 4.22–5.81)
RDW: 15.8 % — ABNORMAL HIGH (ref 11.5–15.5)
WBC: 6 10*3/uL (ref 4.0–10.5)
nRBC: 0 % (ref 0.0–0.2)

## 2023-08-06 MED ORDER — CHLORHEXIDINE GLUCONATE CLOTH 2 % EX PADS: 6.0000 | MEDICATED_PAD | Freq: Every day | CUTANEOUS | Status: DC

## 2023-08-06 MED ORDER — POTASSIUM CHLORIDE 10 MEQ/100ML IV SOLN
10.0000 meq | INTRAVENOUS | Status: AC
Start: 2023-08-06 — End: 2023-08-07
  Administered 2023-08-06 (×3): 10 meq via INTRAVENOUS
  Filled 2023-08-06: qty 100

## 2023-08-06 MED ORDER — MUPIROCIN 2 % EX OINT
1.0000 | TOPICAL_OINTMENT | Freq: Two times a day (BID) | CUTANEOUS | Status: DC
  Administered 2023-08-06 – 2023-08-09 (×8): 1 via NASAL
  Filled 2023-08-06: qty 22

## 2023-08-06 NOTE — Plan of Care (Signed)
  Problem: Clinical Measurements: Goal: Respiratory complications will improve Outcome: Progressing Goal: Cardiovascular complication will be avoided Outcome: Progressing   Problem: Coping: Goal: Level of anxiety will decrease Outcome: Progressing   Problem: Elimination: Goal: Will not experience complications related to urinary retention Outcome: Progressing   

## 2023-08-06 NOTE — Consult Note (Signed)
 Patient ID: Thomas [name], male   DOB: 1930-11-05, 89 y.o.   MRN: [id number] CC: Possible CHolecystitis History of Present Illness Thomas [name] is a 89 y.o. male with .  Past medical history significant for peripheral arterial disease status post carotid endarterectomy last year on aspirin, dementia presents in consultation for possible cholecystitis.  The patient is known by me because he was seen in December.  At that time he was still on Plavix and he was admitted with cholecystitis.  He had a cholecystostomy drain placed at that time.  Since then he has undergone a few tube exchanges.  His last tube exchange that I can see was significant for a patent cystic duct.  His cholecystostomy tube has been dislodged a number of times and the last time it was dislodged 2 weeks ago and not replaced.  Most of the history is obtained from chart review as the patient reports no pain now and says he did not have any pain when he came to the hospital.  However, notes indicate that he started to have some leakage from his tube site.  He also reported aching nonradiating right upper quadrant pain.  Again, at this time the patient denies any pain and I actually had to wake him up when I went in the room to examine him.  Per the nursing staff he is having bowel function and had a large bowel movement yesterday.  When he came to the hospital he had a slight leukocytosis to 11,000 and a lactic acidosis of 3.3 that corrected quickly with fluid resuscitation.  He was started on antibiotics and his leukocytosis has normalized.  Past Medical History Past Medical History:  Diagnosis Date   Arthritis    Carpal tunnel syndrome on both sides    GERD (gastroesophageal reflux disease)    Hyperlipidemia    Hypertension        Past Surgical History:  Procedure Laterality Date   CHOLECYSTECTOMY     3/25   ENDARTERECTOMY Right 03/19/2023   Procedure: ENDARTERECTOMY CAROTID;  Surgeon: Renford Dills, MD;  Location: [facility] ORS;   Service: Vascular;  Laterality: Right;   INGUINAL HERNIA REPAIR  2001   IR EXCHANGE BILIARY DRAIN  05/16/2023   IR EXCHANGE BILIARY DRAIN  05/28/2023   IR PERC CHOLECYSTOSTOMY  04/17/2023   REPLACEMENT TOTAL KNEE Bilateral     No Known Allergies  Current Facility-Administered Medications  Medication Dose Route Frequency Provider Last Rate Last Admin   acetaminophen (TYLENOL) tablet 650 mg  650 mg Oral Q6H PRN Lorretta Harp, MD       aspirin chewable tablet 81 mg  81 mg Oral Daily Lorretta Harp, MD   81 mg at 08/05/23 0951   atorvastatin (LIPITOR) tablet 40 mg  40 mg Oral Daily Lorretta Harp, MD   40 mg at 08/05/23 1154   cefTRIAXone (ROCEPHIN) 2 g in sodium chloride 0.9 % 100 mL IVPB  2 g Intravenous Q24H Lorretta Harp, MD   Stopped at 08/05/23 1820   Chlorhexidine Gluconate Cloth 2 % PADS 6 each  6 each Topical Daily Marcelino Duster, MD       cyanocobalamin (VITAMIN B12) tablet 1,000 mcg  1,000 mcg Oral Daily Lorretta Harp, MD   1,000 mcg at 08/05/23 0950   hydrALAZINE (APRESOLINE) injection 5 mg  5 mg Intravenous Q2H PRN Lorretta Harp, MD       metroNIDAZOLE (FLAGYL) IVPB 500 mg  500 mg Intravenous Q12H Lorretta Harp, MD 100 mL/hr at  08/06/23 0510 500 mg at 08/06/23 0510   morphine (PF) 2 MG/ML injection 0.5 mg  0.5 mg Intravenous Q4H PRN Lorretta Harp, MD       mupirocin ointment (BACTROBAN) 2 % 1 Application  1 Application Nasal BID Marcelino Duster, MD   1 Application at 08/06/23 0313   ondansetron (ZOFRAN) injection 4 mg  4 mg Intravenous Q8H PRN Lorretta Harp, MD       pantoprazole (PROTONIX) EC tablet 40 mg  40 mg Oral Daily Lorretta Harp, MD   40 mg at 08/05/23 0950   sertraline (ZOLOFT) tablet 25 mg  25 mg Oral Daily Lorretta Harp, MD   25 mg at 08/05/23 1154   tamsulosin (FLOMAX) capsule 0.4 mg  0.4 mg Oral QPC supper Lorretta Harp, MD   0.4 mg at 08/05/23 1736    Family History Family History  Problem Relation Age of Onset   Cancer Sister        Liver cancer   Melanoma Sister    Cancer Brother         Pancreatic   Cancer Brother        Lung cancer   Heart disease Brother        Social History Social History   Tobacco Use   Smoking status: Never   Smokeless tobacco: Never  Vaping Use   Vaping status: Never Used  Substance Use Topics   Alcohol use: Never   Drug use: Never        ROS Full ROS of systems performed and is otherwise negative there than what is stated in the HPI  Physical Exam Blood pressure 126/82, pulse 63, temperature 97.8 F (36.6 C), temperature source Oral, resp. rate 16, height 5\' 8"  (1.727 m), weight 65.8 kg, SpO2 96%.  Patient is alert to self and knows he is in the hospital but cannot tell me what year it is or why he is in the hospital.  He is in no acute distress, normal work of breathing on room air, abdomen is soft, slightly distended.  When he relaxes there is no pain in the right upper quadrant.  He has no abdominal pain.  No rebound tenderness.  There is some serosanguineous fluid that is on the bandage from his previous cholecystostomy tube site.  Data Reviewed I independently reviewed his CT scan.  He has some fluid in the subcutaneous tissue along the tract of his cholecystostomy fistula.  This is likely what is necessitating out of of his drain site.  His gallbladder does not have any inflammation around it that was seen on his CT from December.  The gallbladder does seem a little bit thickened.  I do not see a large amount of pericholecystic fluid around this.  His labs are notable for normal white count at 6 this morning.  He did have a slight leukocytosis to 11,000 when he first came to the hospital.  I have personally reviewed the patient's imaging and medical records.    Assessment/Plan    The patient is a 89 year old with history of peripheral arterial disease who had a endarterectomy in November of last year who was initially on aspirin and Plavix.  He had a cholecystostomy tube placed but that has since fallen out.  On his last  cholangiogram he did have a patent cystic duct.  He came to the hospital for abdominal pain but he has no pain on exam right now and his leukocytosis has normalized.  I think we need to have  a long discussion about whether he should not have his gallbladder out or not but for now I think treating with antibiotics is the most appropriate course of action.  It is okay to start him on a clear liquid diet today.  If he tolerates this over the day today then can likely advance diet as tolerated tomorrow.  Again we will plan to see him outpatient if he gets over this course to discuss cholecystectomy.  My hope is that he does not backslide and have worsening pain or worsening hemodynamic status or worsening labs but if he does then would replace the cholecystostomy tube.      Kandis Cocking 08/06/2023, 8:34 AM

## 2023-08-06 NOTE — Progress Notes (Signed)
 Progress Note   Patient: Thomas [name] NWG:[id number] DOB: [birth year] DOA: 08/04/2023     2 DOS: the patient was seen and examined on 08/06/2023   Brief hospital course: Thomas [name] is a 89 y.o. male with medical history significant of HTN, HLD, stroke, GERD, depression, BPH, carotid artery stenosis, cholecystitis, cholecystostomy tube pulled out and replaced 4 times, with last time 10 days ago when it was not reinserted presented with abdominal pain.   CT abdomen showed Interval removal of the percutaneous cholecystostomy with an ill-defined inflammatory area in the right anterior upper abdominal wall which may represent a hematoma or a phlegmon. A fistulous tract extending from the gallbladder fundus to the anterior abdominal wall and skin is not excluded.  IR advised there is not an acute need to replace the drain. Surgery team consulted per daughter request.  General surgery has evaluated the patient at this time advised to continue treatment with IV antibiotic we will see if he gets better at that point they would like to see him as an outpatient and have a discussion about cholecystectomy then.  Assessment and Plan: RUQ Abdominal pain Chronic cholecystitis S/p perc cholecystostomy tube out since 10 days. Possible phlegmon Seen by IR no need for acute chole tube replacement. Discussed with daughter who wishes to get surgery consult, thinks he is getting worse off the tube and asks about surgery. Surgery team consulted (Dr. Maurine Minister to see him) General surgery has evaluated the patient at this time advised to continue treatment with IV antibiotic we will see if he gets better at that point they would like to see him as an outpatient and have a discussion about cholecystectomy then. Continue IV antibiotic ceftriaxone. Patient currently reports resolution of abdominal pain, patient initiated on clear liquid diet and will advance as tolerated.  Elevated lactic acid level:  Lactic acid  improved with IV hydration. Continue gentle IV fluids.  Hypokalemia- IV potassium supplements ordered. Monitor daily electrolytes. Potassium 3.3 this morning replenished through the IV route   Hypertension:  Blood pressure 116/68.   IV hydralazine as needed   HLD (hyperlipidemia) Continue Lipitor   Normocytic anemia:  Hemoglobin stable 10.9 which was also 10.9 on 07/21/2023.  AAA (abdominal aortic aneurysm) (HCC): This is incidental findings by CT scan. Follow-up with PCP with 1 year scan.   Stroke [facility]) Continue Aspirin, Lipitor   BPH (benign prostatic hyperplasia) Continue Flomax   Depression On Zoloft         Out of bed to chair. Incentive spirometry. Nursing supportive care. Fall, aspiration precautions. Diet:  Diet Orders (From admission, onward)     Start     Ordered   08/06/23 0918  Diet clear liquid Room service appropriate? Yes; Fluid consistency: Thin  Diet effective now       Question Answer Comment  Room service appropriate? Yes   Fluid consistency: Thin      08/06/23 0917           DVT prophylaxis: SCDs Start: 08/04/23 1935  Level of care: med-surg   Code Status: Limited: Do not attempt resuscitation (DNR) -DNR-LIMITED -Do Not Intubate/DNI   Subjective: Patient is seen and examined today morning.  Patient reports that the right quadrant abdominal pain has improved.  Denies having any nausea or vomiting. Physical Exam: Vitals:   08/05/23 1540 08/05/23 2113 08/06/23 0442 08/06/23 0826  BP: 128/75 (!) 121/58 (!) 140/71 126/82  Pulse: (!) 59 62 64 63  Resp: 20 16 18  16  Temp: 97.8 F (36.6 C) 97.9 F (36.6 C) 98.3 F (36.8 C) 97.8 F (36.6 C)  TempSrc: Oral   Oral  SpO2: 97% 97% 98% 96%  Weight:      Height:        General - Elderly Caucasian male, no apparent distress HEENT - PERRLA, EOMI, atraumatic head, non tender sinuses. Lung - Clear, diffuse rales, no rhonchi, wheezes. Heart - S1, S2 heard, no murmurs, rubs, trace pedal  edema. Abdomen - Soft, RUQ tender, distended, bowel sounds good Neuro - Alert, awake and oriented x 3, non focal exam. Skin - Warm and dry.  Data Reviewed:      Latest Ref Rng & Units 08/06/2023    5:32 AM 08/05/2023    4:24 AM 08/04/2023    2:24 PM  CBC  WBC 4.0 - 10.5 K/uL 6.0  6.4  11.0   Hemoglobin 13.0 - 17.0 g/dL 9.6  8.1  56.3   Hematocrit 39.0 - 52.0 % 30.5  26.2  34.7   Platelets 150 - 400 K/uL 330  281  373       Latest Ref Rng & Units 08/06/2023    5:32 AM 08/05/2023    4:24 AM 08/04/2023    2:24 PM  BMP  Glucose 70 - 99 mg/dL 65  84  149   BUN 8 - 23 mg/dL 13  14  18    Creatinine 0.61 - 1.24 mg/dL 7.02  6.37  8.58   Sodium 135 - 145 mmol/L 141  139  139   Potassium 3.5 - 5.1 mmol/L 3.3  3.1  3.6   Chloride 98 - 111 mmol/L 109  107  106   CO2 22 - 32 mmol/L 21  22  20    Calcium 8.9 - 10.3 mg/dL 8.5  8.2  8.6    CT ABDOMEN PELVIS W CONTRAST Result Date: 08/04/2023 CLINICAL DATA:  Right upper quadrant abdominal pain. History of percutaneous cholecystostomy. EXAM: CT ABDOMEN AND PELVIS WITH CONTRAST TECHNIQUE: Multidetector CT imaging of the abdomen and pelvis was performed using the standard protocol following bolus administration of intravenous contrast. RADIATION DOSE REDUCTION: This exam was performed according to the departmental dose-optimization program which includes automated exposure control, adjustment of the mA and/or kV according to patient size and/or use of iterative reconstruction technique. CONTRAST:  OMNIPAQUE IOHEXOL 300 MG/ML  SOLN COMPARISON:  CT abdomen pelvis dated 04/21/2023. FINDINGS: Lower chest: Bibasilar interstitial coarsening and atelectasis/scarring. No intra-abdominal free air or free fluid. Hepatobiliary: The liver is unremarkable. No biliary dilatation. The gallbladder is mildly distended. Noncalcified stone or sludge in the gallbladder. The previously seen percutaneous cholecystostomy is not visualized. There is stranding of the  pericholecystic fat adjacent to the gallbladder fundus. There is thickening of the gallbladder fundus. Ill-defined inflammatory tissue extends from the gallbladder fundus to the anterior abdominal wall corresponding to the track of the cholecystostomy tube. There is an ill-defined 3.5 x 2.6 cm inflammatory area in the anterior right upper abdominal wall with a low attenuating central area extending to the skin. This may represent a small hematoma but a phlegmon/developing abscess is not excluded. Ultrasound may provide better evaluation. Pancreas: Unremarkable. No pancreatic ductal dilatation or surrounding inflammatory changes. Spleen: Normal in size without focal abnormality. Adrenals/Urinary Tract: The adrenal glands are unremarkable. Bilateral renal cysts measure up to 6 cm in the inferior pole of the left kidney. There is no hydronephrosis on either side. There is symmetric enhancement and excretion of contrast by both kidneys.  The visualized ureters appear unremarkable. The urinary bladder is minimally distended and grossly unremarkable. There is apparent diffuse thickening of the bladder wall which may be partly related to underdistention. Cystitis is not excluded. Correlation with urinalysis recommended. Stomach/Bowel: Small hiatal hernia. There is no bowel obstruction or active inflammation. No CT evidence of acute appendicitis. Vascular/Lymphatic: Advanced aortoiliac atherosclerotic disease. There is a 4.1 cm infrarenal abdominal aortic aneurysm. The IVC is unremarkable. No portal venous gas. There is no adenopathy. Reproductive: Mildly enlarged prostate gland with median lobe hypertrophy. Other: Small fat containing umbilical hernia. Musculoskeletal: Osteopenia with degenerative changes. No acute osseous pathology. IMPRESSION: 1. Interval removal of the percutaneous cholecystostomy with an ill-defined inflammatory area in the right anterior upper abdominal wall which may represent a hematoma or a  phlegmon. A fistulous tract extending from the gallbladder fundus to the anterior abdominal wall and skin is not excluded. 2. No bowel obstruction. 3. A 4.1 cm infrarenal abdominal aortic aneurysm. Recommend follow-up CT or MR as appropriate in 12 months and referral to or continued care with vascular specialist. (Ref.: J Vasc Surg. 2018; 67:2-77 and J Am Coll Radiol 2013;10(10):789-794.) Electronically Signed   By: Elgie Collard M.D.   On: 08/04/2023 17:51    Family Communication: no family at bedside. Discussed with daughter over phone, asked about surgical option.  Disposition: Status is: Inpatient Remains inpatient appropriate because: pain control, surgery eval.  Planned Discharge Destination: Skilled nursing facility     Time spent: 40 minutes  Author: Harold Hedge, MD 08/06/2023 1:41 PM Secure chat 7am to 7pm For on call review [email or web address].

## 2023-08-06 NOTE — TOC Progression Note (Addendum)
 Transition of Care [facility]) - Progression Note    Patient Details  Name: KAWAN VALLADOLID MRN: [id number] Date of Birth: 16-Feb-1931  Transition of Care [facility]) CM/SW Contact  Liliana Cline, LCSW Phone Number: 08/06/2023, 12:05 PM  Clinical Narrative:    Spoke with [facility] Liaison Brittney - she states patient resides at [facility] in [place] [facility] and can return when medically ready- but cannot come over the weekend.   Expected Discharge Plan: Skilled Nursing Facility Barriers to Discharge: Continued Medical Work up  Expected Discharge Plan and Services   Discharge Planning Services: CM Consult   Living arrangements for the past 2 months: Skilled Nursing Facility                                       Social Determinants of Health (SDOH) Interventions SDOH Screenings   Food Insecurity: Patient Unable To Answer (08/05/2023)  Housing: Patient Unable To Answer (08/05/2023)  Transportation Needs: Patient Unable To Answer (08/05/2023)  Utilities: Patient Unable To Answer (08/05/2023)  Financial Resource Strain: Low Risk  (10/12/2022)   Received from [facility]  Social Connections: Patient Unable To Answer (08/05/2023)  Tobacco Use: Low Risk  (08/05/2023)  Health Literacy: High Risk (03/12/2023)   Received from [facility]    Readmission Risk Interventions     No data to display

## 2023-08-06 NOTE — Plan of Care (Signed)
?  Problem: Clinical Measurements: ?Goal: Diagnostic test results will improve ?Outcome: Progressing ?  ?Problem: Coping: ?Goal: Level of anxiety will decrease ?Outcome: Progressing ?  ?Problem: Elimination: ?Goal: Will not experience complications related to urinary retention ?Outcome: Progressing ?  ?

## 2023-08-07 DIAGNOSIS — K810 Acute cholecystitis: Secondary | ICD-10-CM

## 2023-08-07 DIAGNOSIS — R1011 Right upper quadrant pain: Secondary | ICD-10-CM | POA: Diagnosis not present

## 2023-08-07 DIAGNOSIS — L0291 Cutaneous abscess, unspecified: Principal | ICD-10-CM

## 2023-08-07 LAB — CBC WITH DIFFERENTIAL/PLATELET
Abs Immature Granulocytes: 0.03 10*3/uL (ref 0.00–0.07)
Basophils Absolute: 0 10*3/uL (ref 0.0–0.1)
Basophils Relative: 0 %
Eosinophils Absolute: 0.2 10*3/uL (ref 0.0–0.5)
Eosinophils Relative: 4 %
HCT: 28.8 % — ABNORMAL LOW (ref 39.0–52.0)
Hemoglobin: 9.1 g/dL — ABNORMAL LOW (ref 13.0–17.0)
Immature Granulocytes: 1 %
Lymphocytes Relative: 26 %
Lymphs Abs: 1.4 10*3/uL (ref 0.7–4.0)
MCH: 27.7 pg (ref 26.0–34.0)
MCHC: 31.6 g/dL (ref 30.0–36.0)
MCV: 87.5 fL (ref 80.0–100.0)
Monocytes Absolute: 0.3 10*3/uL (ref 0.1–1.0)
Monocytes Relative: 6 %
Neutro Abs: 3.5 10*3/uL (ref 1.7–7.7)
Neutrophils Relative %: 63 %
Platelets: 321 10*3/uL (ref 150–400)
RBC: 3.29 MIL/uL — ABNORMAL LOW (ref 4.22–5.81)
RDW: 15.7 % — ABNORMAL HIGH (ref 11.5–15.5)
WBC: 5.5 10*3/uL (ref 4.0–10.5)
nRBC: 0 % (ref 0.0–0.2)

## 2023-08-07 LAB — COMPREHENSIVE METABOLIC PANEL WITH GFR
ALT: 10 U/L (ref 0–44)
AST: 14 U/L — ABNORMAL LOW (ref 15–41)
Albumin: 2.1 g/dL — ABNORMAL LOW (ref 3.5–5.0)
Alkaline Phosphatase: 67 U/L (ref 38–126)
Anion gap: 6 (ref 5–15)
BUN: 10 mg/dL (ref 8–23)
CO2: 25 mmol/L (ref 22–32)
Calcium: 8.4 mg/dL — ABNORMAL LOW (ref 8.9–10.3)
Chloride: 108 mmol/L (ref 98–111)
Creatinine, Ser: 0.7 mg/dL (ref 0.61–1.24)
GFR, Estimated: >60 mL/min (ref >60)
Glucose, Bld: 80 mg/dL (ref 70–99)
Potassium: 3.6 mmol/L (ref 3.5–5.1)
Sodium: 139 mmol/L (ref 135–145)
Total Bilirubin: 0.5 mg/dL (ref 0.0–1.2)
Total Protein: 5.7 g/dL — ABNORMAL LOW (ref 6.5–8.1)

## 2023-08-07 LAB — URINE CULTURE: Culture: <10000 — AB

## 2023-08-07 MED ORDER — BOOST / RESOURCE BREEZE PO LIQD CUSTOM
1.0000 | Freq: Three times a day (TID) | ORAL | Status: DC
  Administered 2023-08-07 – 2023-08-09 (×6): 1 via ORAL

## 2023-08-07 MED ORDER — LACTATED RINGERS IV SOLN: INTRAVENOUS | Status: AC

## 2023-08-07 NOTE — Progress Notes (Signed)
 CC: Cholecystitis Subjective: he does have complex medical history requiring cholecystostomy tube was exchanged 05/28/23 have a CT scan a couple of days ago that I personally reviewed showing evidence of a phlegmon within the right upper quadrant. Continues to drain from the right upper quadrant wound.  No fevers no chills he is tolerating diet He is pleasantly confused  Objective: Vital signs in last 24 hours: Temp:  [97.3 F (36.3 C)-98.8 F (37.1 C)] 98 F (36.7 C) (03/29 0757) Pulse Rate:  [51-59] 51 (03/29 0757) Resp:  [16-19] 16 (03/29 0757) BP: (118-139)/(66-83) 135/74 (03/29 0757) SpO2:  [96 %-99 %] 96 % (03/29 0757) Last BM Date : 08/06/23  Intake/Output from previous day: 03/28 0701 - 03/29 0700 In: 295.7 [IV Piggyback:295.7] Out: -  Intake/Output this shift: Total I/O In: 340 [P.O.:240; IV Piggyback:100] Out: -   Physical exam: Constitutional :  alert, cooperative, appears stated age, and no distress  Lymphatics/Throat:  no asymmetry, masses, or scars  Respiratory:  clear to auscultation bilaterally  Cardiovascular:  regular rate and rhythm  Gastrointestinal: Soft, no guarding, tenderness to palpation left lower quadrant suprapubic area .  There is some chronic drainage from small wound RUQ, draianing seropurulent fluid, no peritonitis  Musculoskeletal: Steady movement  Skin: Cool and moist  Psychiatric: Normal affect, non-agitated, not confused    \  Lab Results: CBC  Recent Labs    08/06/23 0532 08/07/23 0338  WBC 6.0 5.5  HGB 9.6* 9.1*  HCT 30.5* 28.8*  PLT 330 321   BMET Recent Labs    08/06/23 0532 08/07/23 0338  NA 141 139  K 3.3* 3.6  CL 109 108  CO2 21* 25  GLUCOSE 65* 80  BUN 13 10  CREATININE 0.70 0.70  CALCIUM 8.5* 8.4*   PT/INR Recent Labs    08/04/23 1957  LABPROT 16.4*  INR 1.3*   ABG No results for input(s): "PHART", "HCO3" in the last 72 hours.  Invalid input(s): "PCO2", "PO2"  Studies/Results: No results  found.  Anti-infectives: Anti-infectives (From admission, onward)    Start     Dose/Rate Route Frequency Ordered Stop   08/05/23 1800  cefTRIAXone (ROCEPHIN) 2 g in sodium chloride 0.9 % 100 mL IVPB        2 g 200 mL/hr over 30 Minutes Intravenous Every 24 hours 08/04/23 1931     08/05/23 0600  metroNIDAZOLE (FLAGYL) IVPB 500 mg        500 mg 100 mL/hr over 60 Minutes Intravenous Every 12 hours 08/04/23 1931     08/04/23 1815  cefTRIAXone (ROCEPHIN) 2 g in sodium chloride 0.9 % 100 mL IVPB        2 g 200 mL/hr over 30 Minutes Intravenous Once 08/04/23 1813 08/04/23 1907   08/04/23 1815  metroNIDAZOLE (FLAGYL) IVPB 500 mg        500 mg 100 mL/hr over 60 Minutes Intravenous  Once 08/04/23 1813 08/04/23 2222       Assessment/Plan:  Chronic cholecystitis with phlegmon within the right upper quadrant after removal of cholecystostomy tube.  Continue antibiotic therapy.  I do not advise any surgical intervention at tiis time.  May need to repeat CT scan in a few days to see if there is a more formed drainable collection. We will continue to follow [place] that I spent 50 minutes in this encounter including extensive review of medical records, personally reviewing imaging studies, coordinating his care, placing orders and performing documentation  Sterling Big, MD, FACS  08/07/2023

## 2023-08-07 NOTE — TOC Progression Note (Signed)
 Transition of Care [facility]) - Progression Note    Patient Details  Name: TRENDON ZARING MRN: [id number] Date of Birth: Jul 25, 1930  Transition of Care [facility]) CM/SW Contact  Liliana Cline, LCSW Phone Number: 08/07/2023, 3:12 PM  Clinical Narrative:    PT recommends patient get STR when he returns to [facility]. Awaiting response from Tobi Bastos in Admissions to confirm patient can do this.   Expected Discharge Plan: Skilled Nursing Facility Barriers to Discharge: Continued Medical Work up  Expected Discharge Plan and Services   Discharge Planning Services: CM Consult   Living arrangements for the past 2 months: Skilled Nursing Facility                                       Social Determinants of Health (SDOH) Interventions SDOH Screenings   Food Insecurity: Patient Unable To Answer (08/05/2023)  Housing: Patient Unable To Answer (08/05/2023)  Transportation Needs: Patient Unable To Answer (08/05/2023)  Utilities: Patient Unable To Answer (08/05/2023)  Financial Resource Strain: Low Risk  (10/12/2022)   Received from [facility]  Social Connections: Patient Unable To Answer (08/05/2023)  Tobacco Use: Low Risk  (08/05/2023)  Health Literacy: High Risk (03/12/2023)   Received from [facility]    Readmission Risk Interventions     No data to display

## 2023-08-07 NOTE — Progress Notes (Signed)
 Progress Note   Patient: Thomas [name]:[id number] DOB: [birth year] DOA: 08/04/2023     3 DOS: the patient was seen and examined on 08/07/2023   Brief hospital course: Thomas [name] is a 89 y.o. male with medical history significant of HTN, HLD, stroke, GERD, depression, BPH, carotid artery stenosis, cholecystitis, cholecystostomy tube pulled out and replaced 4 times, with last time 10 days ago when it was not reinserted presented with abdominal pain.   CT abdomen showed Interval removal of the percutaneous cholecystostomy with an ill-defined inflammatory area in the right anterior upper abdominal wall which may represent a hematoma or a phlegmon. A fistulous tract extending from the gallbladder fundus to the anterior abdominal wall and skin is not excluded.  IR advised there is not an acute need to replace the drain. Surgery team consulted per daughter request.  General surgery has evaluated the patient at this time advised to continue treatment with IV antibiotic we will see if he gets better at that point they would like to see him as an outpatient and have a discussion about cholecystectomy then.  He has been evaluated by PT. The patient required max assist to sit on the edge of the bed, and was only able to tolerate it for about 1 minutes. The patient is amenable to return to [facility], but will need to be able to receive ongoing PT/OT there.  Assessment and Plan: RUQ Abdominal pain Chronic cholecystitis S/p perc cholecystostomy tube out since 10 days. Possible phlegmon on CT. Seen by IR no need for acute chole tube replacement. Surgery consult obtained at the urging of the patient's daughter who feels that he did not do well with the tube. The patient was evaluated by Dr. Maurine Minister who stated that the patient was not a candidate for surgery, and did not need to have the tube replaced. He has recommended ongoing IV antibiotics therapy. Dr. Maurine Minister will follow up with the patient as  outpatient and discuss possible surgery then. The patient will be continued on IV Ceftriaxone. Patient currently reports resolution of abdominal pain, patient initiated on clear liquid diet and will advance as tolerated.  Elevated lactic acid level:  Lactic acidosis has resolved with IV hydration. Lactic acid was 0.8 on 08/05/2023.   Hypokalemia- Resolved. Potassium was 3.6 on 08/07/2023. Monitor daily electrolytes.   Hypertension:  The patient is normotensive on prn hydralazine only. Continue to monitor.   HLD (hyperlipidemia) Continue Lipitor   Normocytic anemia:  Hemoglobin has decreased to 9.1. Likely delutional. Monitor.   AAA (abdominal aortic aneurysm) (HCC): This is incidental findings by CT scan. Follow-up with PCP with 1 year scan.   Stroke (HCC) Continue Aspirin, Lipitor   BPH (benign prostatic hyperplasia) Continue Flomax   Depression On Zoloft   I have seen and examined this patient myself. I have spent 32 minutes in his evaluation and care.   Diet:  Diet Orders (From admission, onward)     Start     Ordered   08/07/23 1219  Diet full liquid Room service appropriate? Yes; Fluid consistency: Thin  Diet effective now       Question Answer Comment  Room service appropriate? Yes   Fluid consistency: Thin      08/07/23 1218           DVT prophylaxis: SCDs Start: 08/04/23 1935  Level of care: med-surg   Code Status: Limited: Do not attempt resuscitation (DNR) -DNR-LIMITED -Do Not Intubate/DNI   Subjective: Patient is seen and  examined today morning.The patient is resting comfortably. No new complaints.  Physical Exam: Vitals:   08/06/23 1539 08/06/23 2016 08/07/23 0451 08/07/23 0757  BP: 118/74 139/83 125/66 135/74  Pulse: (!) 56 (!) 58 (!) 59 (!) 51  Resp: 18 18 19 16   Temp: 98.8 F (37.1 C) (!) 97.3 F (36.3 C) 98 F (36.7 C) 98 F (36.7 C)  TempSrc:      SpO2: 99% 97% 98% 96%  Weight:      Height:        Exam:  Constitutional:  The  patient is awake, alert, and oriented x 3. No acute distress. Respiratory:  No increased work of breathing. No wheezes, rales, or rhonchi No tactile fremitus Cardiovascular:  Regular rate and rhythm No murmurs, ectopy, or gallups. No lateral PMI. No thrills. Abdomen:  Abdomen is soft, non-tender, non-distended No hernias, masses, or organomegaly Normoactive bowel sounds.  Musculoskeletal:  No cyanosis, clubbing, or edema Skin:  No rashes, lesions, ulcers palpation of skin: no induration or nodules Neurologic:  CN 2-12 intact Sensation all 4 extremities intact Psychiatric:  Mental status Mood, affect appropriate Orientation to person, place, time  judgment and insight appear intact   Data Reviewed:      Latest Ref Rng & Units 08/07/2023    3:38 AM 08/06/2023    5:32 AM 08/05/2023    4:24 AM  CBC  WBC 4.0 - 10.5 K/uL 5.5  6.0  6.4   Hemoglobin 13.0 - 17.0 g/dL 9.1  9.6  8.1   Hematocrit 39.0 - 52.0 % 28.8  30.5  26.2   Platelets 150 - 400 K/uL 321  330  281       Latest Ref Rng & Units 08/07/2023    3:38 AM 08/06/2023    5:32 AM 08/05/2023    4:24 AM  BMP  Glucose 70 - 99 mg/dL 80  65  84   BUN 8 - 23 mg/dL 10  13  14    Creatinine 0.61 - 1.24 mg/dL 4.09  8.11  9.14   Sodium 135 - 145 mmol/L 139  141  139   Potassium 3.5 - 5.1 mmol/L 3.6  3.3  3.1   Chloride 98 - 111 mmol/L 108  109  107   CO2 22 - 32 mmol/L 25  21  22    Calcium 8.9 - 10.3 mg/dL 8.4  8.5  8.2    No results found.   Family Communication: no family at bedside. Discussed with daughter over phone, asked about surgical option.  Disposition: Status is: Inpatient Remains inpatient appropriate because: pain control, surgery eval.  Planned Discharge Destination: Skilled nursing facility with PT/OT.     Time spent: 32 minutes  Author: Recia Sons, DO 08/07/2023 3:12 PM Secure chat 7am to 7pm For on call review [email or web address].

## 2023-08-07 NOTE — Evaluation (Signed)
 Occupational Therapy Evaluation Patient Details Name: Thomas [name] MRN: [id number] DOB: 1931/02/24 Today's Date: 08/07/2023   History of Present Illness   Pt admitted to [facility] 08/04/23 for abdominal pain. Recent cholecystotomy (Dec 2024); tube pulled out and replaced 4 times, with last time being 10 days ago and it was not reinserted. General surgery consulted and recommend medical management with IV antibiotics at this time. Significant PMH includes: HTN, HLD, stroke, GERD, depression, BPH, carotid artery stenosis, cholecystitis     Clinical Impressions Pt was seen for OT evaluation this date. Prior to hospital admission, pt has been a LTC resident since Dec at [facility] with 2 person assist or lift used for transfers. Likely total assist for ADLs.  Pt presents to acute OT demonstrating impaired ADL performance and functional mobility 2/2 weakness, pain, limited activity tolerance and poor balance with cognitive deficits. Pt currently requires total assist for all bed mobility tasks. Tolerated ~5 mins seated EOB with Max A progressing to very short period of CGA with cueing for anterior weight shift. Significant posterior lean in sitting. BUE ROM WFL, but generalized weakness noted. Pt reports tightness to his abdomen.  Pt would benefit from skilled OT services to address noted impairments and functional limitations (see below for any additional details) in order to maximize safety and independence while minimizing falls risk and caregiver burden. Recommend pt to return to LTC facility with follow up therapy services as indicated by his facility. Will follow acutely.      If plan is discharge home, recommend the following:   Two people to help with walking and/or transfers;Two people to help with bathing/dressing/bathroom;A lot of help with bathing/dressing/bathroom     Functional Status Assessment   Patient has had a recent decline in their functional status and demonstrates the  ability to make significant improvements in function in a reasonable and predictable amount of time.     Equipment Recommendations   Other (comment) (defer)     Recommendations for Other Services         Precautions/Restrictions   Precautions Precautions: Fall Precaution/Restrictions Comments: DNR/Limited Restrictions Weight Bearing Restrictions Per Provider Order: No Other Position/Activity Restrictions: SpO2 >/= 92%     Mobility Bed Mobility Overal bed mobility: Needs Assistance Bed Mobility: Supine to Sit, Sit to Supine     Supine to sit: Total assist, HOB elevated, Used rails Sit to supine: Total assist   General bed mobility comments: able to raise BLEs for pillow to be removed and able to initiate movement, but ultimately needed total assist to reach EOB and to return to supine; max A progressing to CGA for short period of time/limited seated tolrance at EOB; x2 assist to reposition to [facility] using chux pad and bed in trendelenburg position    Transfers                   General transfer comment: deferred with very limited seated toleranc EOB with Max to Min A      Balance Overall balance assessment: Needs assistance Sitting-balance support: Single extremity supported, Bilateral upper extremity supported, Feet supported Sitting balance-[name]: Poor Sitting balance - Comments: total to Max to very short time of CGA seated EOB with significant posterior lean Postural control: Posterior lean                                 ADL either performed or assessed with clinical judgement  ADL Overall ADL's : Needs assistance/impaired     Grooming: Wash/dry face;Set up;Bed level               Lower Body Dressing: Total assistance Lower Body Dressing Details (indicate cue type and reason): anticipate total assist                     Vision         Perception         Praxis         Pertinent Vitals/Pain Pain  Assessment Pain Assessment: Faces Faces Pain Scale: Hurts a little bit Pain Location: tightness to addomen Pain Descriptors / Indicators: Tightness, Tender Pain Intervention(s): Monitored during session, Repositioned     Extremity/Trunk Assessment Upper Extremity Assessment Upper Extremity Assessment: Generalized weakness   Lower Extremity Assessment Lower Extremity Assessment: Generalized weakness       Communication Communication Communication: No apparent difficulties   Cognition Arousal: Alert Behavior During Therapy: WFL for tasks assessed/performed                                 Following commands: Impaired Following commands impaired: Follows one step commands with increased time, Follows one step commands inconsistently     Cueing  General Comments   Cueing Techniques: Verbal cues;Tactile cues;Visual cues      Exercises     Shoulder Instructions      Home Living Family/patient expects to be discharged to:: Skilled nursing facility                                 Additional Comments: Pt is long term care resident at [facility]. Per SIL, he has not received therapy services since he transitioned to long-term care.      Prior Functioning/Environment               Mobility Comments: SIL reports +2 assist for transfers OOB vs. lift machine ADLs Comments: Likely requires total assist for ADL's, IADL's, and medication    OT Problem List: Decreased strength;Impaired balance (sitting and/or standing);Decreased activity tolerance;Decreased safety awareness   OT Treatment/Interventions: Self-care/ADL training;Therapeutic exercise;Balance training;Therapeutic activities;Patient/family education      OT Goals(Current goals can be found in the care plan section)   Acute Rehab OT Goals Patient Stated Goal: return to LTC facility OT Goal Formulation: With patient Time For Goal Achievement: 08/21/23 Potential to Achieve  Goals: Fair ADL Goals Pt Will Perform Grooming: sitting;with contact guard assist Pt Will Perform Upper Body Bathing: with min assist;sitting;with mod assist   OT Frequency:  Min 1X/week    Co-evaluation              AM-PAC OT "6 Clicks" Daily Activity     Outcome Measure Help from another person eating meals?: A Little Help from another person taking care of personal grooming?: A Little Help from another person toileting, which includes using toliet, bedpan, or urinal?: Total Help from another person bathing (including washing, rinsing, drying)?: Total Help from another person to put on and taking off regular upper body clothing?: A Lot Help from another person to put on and taking off regular lower body clothing?: Total 6 Click Score: 11   End of Session    Activity Tolerance: Patient limited by fatigue Patient left: in bed;with bed alarm set;with call bell/phone within reach;with nursing/sitter in  room  OT Visit Diagnosis: Other abnormalities of gait and mobility (R26.89);Muscle weakness (generalized) (M62.81)                Time: 1610-9604 OT Time Calculation (min): 15 min Charges:  OT General Charges $OT Visit: 1 Visit OT Evaluation $OT Eval Moderate Complexity: 1 Mod Marvelle Span, OTR/L 08/07/23, 4:38 PM  Levaughn Puccinelli E Gill Delrossi 08/07/2023, 4:36 PM

## 2023-08-07 NOTE — Evaluation (Signed)
 Physical Therapy Evaluation Patient Details Name: GLADYS GUTMAN MRN: [id number] DOB: [birth year] Today's Date: 08/07/2023  History of Present Illness  Pt admitted to [facility] 08/04/23 for abdominal pain. Recent cholecystotomy (Dec 2024); tube pulled out and replaced 4 times, with last time being 10 days ago and it was not reinserted. General surgery consulted and recommend medical management with IV antibiotics at this time. Significant PMH includes: HTN, HLD, stroke, GERD, depression, BPH, carotid artery stenosis, cholecystitis   Clinical Impression  Pt is a 89 year old M admitted to hospital on 08/04/23 for abdominal pain. Pt has been LTC resident at [facility] since Dec; he requires +2 assist for transfers OOB vs lift machine, and requires assist for ADL's, IADL's, and medication management. SIL reports that he has not received therapy services since he transitioned to LTC resident.   Pt presents with abnormal tone of BLE, impaired gross motor coordination, poor proximal stability, generalized weakness, confusion, delayed processing, and decreased activity tolerance, resulting in impaired functional mobility. Due to deficits, pt required max-total assist for bed mobility and total-mod assist for static seated balance at EOB. Able to improve balance with cues for anterior weight shift and proper BUE support.   Deficits limit the pt's ability to safely and independently perform ADL's and transfer. Pt will benefit from acute skilled PT services to address deficits for return to baseline function. Pt will benefit from post acute therapy services to address deficits for return to baseline function.         If plan is discharge home, recommend the following: Two people to help with walking and/or transfers;A lot of help with bathing/dressing/bathroom;Direct supervision/assist for medications management;Assist for transportation;Help with stairs or ramp for entrance;Supervision due to cognitive status    Can travel by private vehicle   No    Equipment Recommendations  (defer to post acute)     Functional Status Assessment Patient has had a recent decline in their functional status and demonstrates the ability to make significant improvements in function in a reasonable and predictable amount of time.     Precautions / Restrictions Precautions Precautions: Fall Precaution/Restrictions Comments: DNR/Limited Restrictions Weight Bearing Restrictions Per Provider Order: No Other Position/Activity Restrictions: SpO2 >/= 92%      Mobility  Bed Mobility Overal bed mobility: Needs Assistance Bed Mobility: Supine to Sit, Sit to Supine     Supine to sit: Max assist Sit to supine: Total assist   General bed mobility comments: Pt able to initiate BLE facilitation towards EOB and UE support on bed rail with supine>sit transfer, but ultimately requires max assist for trunk and BLE facilitation to sit EOB. Total assist required for sit>supine with HOB semi-elevated. Total assist +2 for supine scooting towards HOB for repositoining via draw sheet, bed in trendelenburg position.    Transfers                   General transfer comment: not appropriate at this time; requires total-mod assist for static seated balance       Balance Overall balance assessment: Needs assistance Sitting-balance support: Single extremity supported, Bilateral upper extremity supported, Feet supported Sitting balance-[name]: Zero Sitting balance - Comments: initially total assist for seated balance at EOB secondary to retropulsion; able to improve to mod assist with proper UE placement and cues for anterior lean       Standing balance comment: NT  Pertinent Vitals/Pain Pain Assessment Pain Assessment: No/denies pain    Home Living Family/patient expects to be discharged to:: Skilled nursing facility                   Additional Comments: Pt  is long term care resident at [facility]. Per SIL, he has not received therapy services since he transitioned to long-term care.    Prior Function               Mobility Comments: SIL reports +2 assist for transfers OOB vs. lift machine ADLs Comments: Likely requires total assist for ADL's, IADL's, and medication     Extremity/Trunk Assessment   Upper Extremity Assessment Upper Extremity Assessment: Generalized weakness    Lower Extremity Assessment Lower Extremity Assessment: Generalized weakness       Communication   Communication Communication: No apparent difficulties    Cognition Arousal: Alert Behavior During Therapy: WFL for tasks assessed/performed   PT - Cognitive impairments: Orientation, Awareness, Attention, Sequencing, Problem solving, Safety/Judgement                         Following commands: Impaired Following commands impaired: Follows one step commands with increased time     Cueing Cueing Techniques: Verbal cues, Tactile cues, Visual cues        Exercises Other Exercises Other Exercises: Pt and SIL educated re: PT role/POC, DC recommendations, safety with functional mobility. They verbalized understanding.   Assessment/Plan    PT Assessment Patient needs continued PT services  PT Problem List Decreased strength;Decreased activity tolerance;Decreased balance;Decreased mobility;Decreased coordination;Decreased safety awareness       PT Treatment Interventions DME instruction;Gait training;Functional mobility training;Therapeutic activities;Therapeutic exercise;Balance training;Neuromuscular re-education;Patient/family education;Wheelchair mobility training    PT Goals (Current goals can be found in the Care Plan section)  Acute Rehab PT Goals Patient Stated Goal: "to be able to transfer to [facility] so I can go to my family's home on the weekends" PT Goal Formulation: With patient/family Time For Goal Achievement:  08/21/23 Potential to Achieve Goals: Fair Additional Goals Additional Goal #1: Pt will be supervision for safety to mobilize self 78ft in WC, to promote independence with mobility and decrease caregiver burden.    Frequency Min 1X/week        AM-PAC PT "6 Clicks" Mobility  Outcome Measure Help needed turning from your back to your side while in a flat bed without using bedrails?: A Lot Help needed moving from lying on your back to sitting on the side of a flat bed without using bedrails?: A Lot Help needed moving to and from a bed to a chair (including a wheelchair)?: Total Help needed standing up from a chair using your arms (e.g., wheelchair or bedside chair)?: Total Help needed to walk in hospital room?: Total Help needed climbing 3-5 steps with a railing? : Total 6 Click Score: 8    End of Session   Activity Tolerance: Patient tolerated treatment well Patient left: in bed;with call bell/phone within reach;with bed alarm set;with family/visitor present Nurse Communication: Mobility status PT Visit Diagnosis: Muscle weakness (generalized) (M62.81);Difficulty in walking, not elsewhere classified (R26.2)    Time: 1430-1501 PT Time Calculation (min) (ACUTE ONLY): 31 min   Charges:   PT Evaluation $PT Eval Low Complexity: 1 Low PT Treatments $Therapeutic Activity: 8-22 mins PT General Charges $$ ACUTE PT VISIT: 1 Visit        Vira Blanco, PT, DPT 3:17 PM,08/07/23 Physical Therapist -  [facility]

## 2023-08-08 DIAGNOSIS — R1011 Right upper quadrant pain: Secondary | ICD-10-CM | POA: Diagnosis not present

## 2023-08-08 DIAGNOSIS — K810 Acute cholecystitis: Secondary | ICD-10-CM | POA: Diagnosis not present

## 2023-08-08 LAB — CBC
HCT: 28.6 % — ABNORMAL LOW (ref 39.0–52.0)
Hemoglobin: 9.1 g/dL — ABNORMAL LOW (ref 13.0–17.0)
MCH: 27.7 pg (ref 26.0–34.0)
MCHC: 31.8 g/dL (ref 30.0–36.0)
MCV: 87.2 fL (ref 80.0–100.0)
Platelets: 327 10*3/uL (ref 150–400)
RBC: 3.28 MIL/uL — ABNORMAL LOW (ref 4.22–5.81)
RDW: 15.7 % — ABNORMAL HIGH (ref 11.5–15.5)
WBC: 6 10*3/uL (ref 4.0–10.5)
nRBC: 0 % (ref 0.0–0.2)

## 2023-08-08 LAB — BASIC METABOLIC PANEL WITH GFR
Anion gap: 6 (ref 5–15)
BUN: 8 mg/dL (ref 8–23)
CO2: 25 mmol/L (ref 22–32)
Calcium: 8.3 mg/dL — ABNORMAL LOW (ref 8.9–10.3)
Chloride: 106 mmol/L (ref 98–111)
Creatinine, Ser: 0.71 mg/dL (ref 0.61–1.24)
GFR, Estimated: >60 mL/min (ref >60)
Glucose, Bld: 79 mg/dL (ref 70–99)
Potassium: 3.5 mmol/L (ref 3.5–5.1)
Sodium: 137 mmol/L (ref 135–145)

## 2023-08-08 MED ORDER — LACTULOSE 10 GM/15ML PO SOLN
20.0000 g | Freq: Two times a day (BID) | ORAL | Status: DC
  Administered 2023-08-08 – 2023-08-09 (×2): 20 g via ORAL
  Filled 2023-08-08 (×2): qty 30

## 2023-08-08 MED ORDER — BISACODYL 10 MG RE SUPP
10.0000 mg | Freq: Once | RECTAL | Status: DC
Start: 2023-08-08 — End: 2023-08-10

## 2023-08-08 NOTE — [place] FL2 (Signed)
 [place] MEDICAID FL2 LEVEL OF CARE FORM     IDENTIFICATION  Patient Name: Thomas [name] Birthdate: 02-04-1931 Sex: male Admission Date (Current Location): 08/04/2023  [facility] and [place] Number:  Chiropodist and Address:  [facility], [address], [place], [place] [address]      Provider Number: [id number]  Attending Physician Name and Address:  Fran Lowes, DO  Relative Name and Phone Number:  Serena Croissant (Daughter)  [phone] (Mobile)    Current Level of Care: Hospital Recommended Level of Care: Skilled Nursing Facility Prior Approval Number:    Date Approved/Denied:   PASRR Number: [id number]  Discharge Plan:      Current Diagnoses: Patient Active Problem List   Diagnosis Date Noted   Cholecystitis, acute 08/07/2023   Phlegmon 08/07/2023   Abdominal pain 08/04/2023   Stroke (HCC) 08/04/2023   Elevated lactic acid level 08/04/2023   AAA (abdominal aortic aneurysm) (HCC) 08/04/2023   Normocytic anemia 08/04/2023   Carotid stenosis 06/02/2023   Hypokalemia 04/24/2023   Urinary retention due to benign prostatic hyperplasia 04/23/2023   Gross hematuria 04/23/2023   Sepsis due to gram-negative UTI (HCC) 04/22/2023   Acute kidney injury superimposed on chronic kidney disease (HCC) 04/22/2023   Dyslipidemia 04/22/2023   GERD without esophagitis 04/22/2023   Depression 04/22/2023   BPH (benign prostatic hyperplasia) 04/22/2023   History of acute cholecystitis 04/22/2023   Cholecystostomy care (HCC) 04/22/2023   Acute cholecystitis 04/13/2023   Acute CVA (cerebrovascular accident) (HCC) 02/13/2023   Rhabdomyolysis 02/12/2023   Hypophosphatemia 05/22/2021   Traumatic rhabdomyolysis (HCC)    Neuropathy    Rhabdomyolysis due to COVID-19 05/19/2021   Generalized weakness    Acute respiratory failure with hypoxia (HCC)    Insomnia    Acute metabolic encephalopathy    COVID-19 virus infection 05/28/2020   GERD  (gastroesophageal reflux disease)    Hypertension    HLD (hyperlipidemia)    Multiple lung nodules 12/05/2018   Mass of right side of neck 01/27/2018    Orientation RESPIRATION BLADDER Height & Weight     Self  Normal External catheter Weight: 145 lb (65.8 kg) Height:  5\' 8"  (172.7 cm)  BEHAVIORAL SYMPTOMS/MOOD NEUROLOGICAL BOWEL NUTRITION STATUS      Incontinent Diet (dys 1)  AMBULATORY STATUS COMMUNICATION OF NEEDS Skin   Total Care Verbally Other (Comment) (R abdomen previous chole drain site - gauze dressing)                       Personal Care Assistance Level of Assistance  Bathing, Feeding, Dressing Bathing Assistance: Maximum assistance Feeding assistance: Maximum assistance Dressing Assistance: Maximum assistance     Functional Limitations Info  Sight Sight Info: Impaired (glasses)        SPECIAL CARE FACTORS FREQUENCY  PT (By licensed PT), OT (By licensed OT)     PT Frequency: 5 times per week OT Frequency: 5 times per week            Contractures      Additional Factors Info  Code Status, Allergies Code Status Info: Limited: Do not attempt resuscitation (DNR) -DNR-LIMITED -Do Not Intubate/DNI Allergies Info: nkda           Current Medications (08/08/2023):  This is the current hospital active medication list Current Facility-Administered Medications  Medication Dose Route Frequency Provider Last Rate Last Admin   acetaminophen (TYLENOL) tablet 650 mg  650 mg Oral Q6H PRN Lorretta Harp, MD  aspirin chewable tablet 81 mg  81 mg Oral Daily Lorretta Harp, MD   81 mg at 08/08/23 1048   atorvastatin (LIPITOR) tablet 40 mg  40 mg Oral Daily Lorretta Harp, MD   40 mg at 08/08/23 1048   cefTRIAXone (ROCEPHIN) 2 g in sodium chloride 0.9 % 100 mL IVPB  2 g Intravenous Q24H Lorretta Harp, MD 200 mL/hr at 08/07/23 1833 2 g at 08/07/23 1833   cyanocobalamin (VITAMIN B12) tablet 1,000 mcg  1,000 mcg Oral Daily Lorretta Harp, MD   1,000 mcg at 08/08/23 1048   feeding  supplement (BOOST / RESOURCE BREEZE) liquid 1 Container  1 Container Oral TID BM Swayze, Ava, DO   1 Container at 08/08/23 1048   hydrALAZINE (APRESOLINE) injection 5 mg  5 mg Intravenous Q2H PRN Lorretta Harp, MD       lactated ringers infusion   Intravenous Continuous Swayze, Ava, DO 75 mL/hr at 08/07/23 2120 New Bag at 08/07/23 2120   metroNIDAZOLE (FLAGYL) IVPB 500 mg  500 mg Intravenous Willette Pa, MD 100 mL/hr at 08/08/23 0605 500 mg at 08/08/23 4098   morphine (PF) 2 MG/ML injection 0.5 mg  0.5 mg Intravenous Q4H PRN Lorretta Harp, MD       mupirocin ointment (BACTROBAN) 2 % 1 Application  1 Application Nasal BID Marcelino Duster, MD   1 Application at 08/08/23 1048   ondansetron (ZOFRAN) injection 4 mg  4 mg Intravenous Q8H PRN Lorretta Harp, MD       pantoprazole (PROTONIX) EC tablet 40 mg  40 mg Oral Daily Lorretta Harp, MD   40 mg at 08/08/23 1047   sertraline (ZOLOFT) tablet 25 mg  25 mg Oral Daily Lorretta Harp, MD   25 mg at 08/08/23 1048   tamsulosin (FLOMAX) capsule 0.4 mg  0.4 mg Oral QPC supper Lorretta Harp, MD   0.4 mg at 08/07/23 1836     Discharge Medications: Please see discharge summary for a list of discharge medications.  Relevant Imaging Results:  Relevant Lab Results:   Additional Information SS #: [id number]  Chareese Sergent E Errika Narvaiz, LCSW

## 2023-08-08 NOTE — Plan of Care (Signed)

## 2023-08-08 NOTE — Progress Notes (Signed)
 CC: cholecytitis Subjective: 89 yo he does have complex medical history requiring cholecystostomy tube was exchanged 05/28/23 have a CT scan a couple of days ago that I personally reviewed showing evidence of a phlegmon within the right upper quadrant. Continues to drain from the right upper quadrant wound.  No fevers no chills he is tolerating diet He is pleasantly confused  Son-in-law Jorja Loa is by the bedside.  He did have a lot of questions and I went through everything that has going on with Mr. Chapple. Tim was thinking that his father-in-law was otherwise healthy and I did point out that Mr Gahm has had 2 strokes, he does have dementia, hypertension, hypercholesterolemia, malnutrition, failure to thrive with significant debility and fragility. He seemed to understand my point. I did explain to him that [place] was not an easy spot.  He is 89 years old and his dementia has made him pull his cholecystostomy drain twice requiring replacement.  I also told him that there were no easy solutions for him.  Probably best bet would be antibiotic therapy.  Spent significant time having a good conversation with him  Objective: Vital signs in last 24 hours: Temp:  [97.6 F (36.4 C)-97.7 F (36.5 C)] 97.7 F (36.5 C) (03/30 0918) Pulse Rate:  [53-63] 53 (03/30 0918) Resp:  [15-16] 16 (03/30 0918) BP: (105-126)/(63-77) 124/77 (03/30 0918) SpO2:  [96 %-100 %] 100 % (03/30 0918) Last BM Date : 08/06/23  Intake/Output from previous day: 03/29 0701 - 03/30 0700 In: 340 [P.O.:240; IV Piggyback:100] Out: 1050 [Urine:1050] Intake/Output this shift: No intake/output data recorded.  Physical exam: NAD, debilitated and malnourished male Alert, confused at time, alert. Oriented to place today Abd: soft, minimal discomofrt on palpation with RUQ induration and small open wound with drainage of seropurulent fluid. No peritonitis  Lab Results: CBC  Recent Labs    08/07/23 0338 08/08/23 0431  WBC 5.5 6.0   HGB 9.1* 9.1*  HCT 28.8* 28.6*  PLT 321 327   BMET Recent Labs    08/07/23 0338 08/08/23 0431  NA 139 137  K 3.6 3.5  CL 108 106  CO2 25 25  GLUCOSE 80 79  BUN 10 8  CREATININE 0.70 0.71  CALCIUM 8.4* 8.3*   PT/INR No results for input(s): "LABPROT", "INR" in the last 72 hours. ABG No results for input(s): "PHART", "HCO3" in the last 72 hours.  Invalid input(s): "PCO2", "PO2"  Studies/Results: No results found.  Anti-infectives: Anti-infectives (From admission, onward)    Start     Dose/Rate Route Frequency Ordered Stop   08/05/23 1800  cefTRIAXone (ROCEPHIN) 2 g in sodium chloride 0.9 % 100 mL IVPB        2 g 200 mL/hr over 30 Minutes Intravenous Every 24 hours 08/04/23 1931     08/05/23 0600  metroNIDAZOLE (FLAGYL) IVPB 500 mg        500 mg 100 mL/hr over 60 Minutes Intravenous Every 12 hours 08/04/23 1931     08/04/23 1815  cefTRIAXone (ROCEPHIN) 2 g in sodium chloride 0.9 % 100 mL IVPB        2 g 200 mL/hr over 30 Minutes Intravenous Once 08/04/23 1813 08/04/23 1907   08/04/23 1815  metroNIDAZOLE (FLAGYL) IVPB 500 mg        500 mg 100 mL/hr over 60 Minutes Intravenous  Once 08/04/23 1813 08/04/23 2222       Assessment/Plan:  Chronic cholecystitis with phlegmon within the right upper quadrant after removal of  cholecystostomy tube.  Continue antibiotic therapy.  I do not advise any surgical intervention at tiis time. We will repeat CT scan tomorrow to see if there is a more formed drainable collection. We will continue to follow I had an extensive discussion with the son in law who was at the bedside and explained to him our thought process Please note that I spent 50 minutes in this encounter including extensive review of medical records, personally reviewing imaging studies, coordinating his care, placing orders and performing documentation    Sterling Big, MD, FACS  08/08/2023

## 2023-08-08 NOTE — Progress Notes (Signed)
 Progress Note   Patient: Thomas [name] VHQ:[id number] DOB: [birth year] DOA: 08/04/2023     4 DOS: the patient was seen and examined on 08/08/2023   Brief hospital course: Thomas [name] is a 89 y.o. male with medical history significant of HTN, HLD, stroke, GERD, depression, BPH, carotid artery stenosis, cholecystitis, cholecystostomy tube pulled out and replaced 4 times, with last time 10 days ago when it was not reinserted presented with abdominal pain.   CT abdomen showed Interval removal of the percutaneous cholecystostomy with an ill-defined inflammatory area in the right anterior upper abdominal wall which may represent a hematoma or a phlegmon. A fistulous tract extending from the gallbladder fundus to the anterior abdominal wall and skin is not excluded.  IR advised there is not an acute need to replace the drain. Surgery team consulted per daughter request. They agree with not replacing tube at this time, but want to repeat CT of the abdomen tomorrow to evaluate if there has been re-accumulation of fluid that would be amenable to drainage.   General surgery recommends continuation of IV antibiotics at this time.  He has been evaluated by PT. The patient required max assist to sit on the edge of the bed, and was only able to tolerate it for about 1 minutes. The patient is amenable to return to [facility], but will need to be able to receive ongoing PT/OT there. They state that they can take him on 08/09/2023.  Assessment and Plan: RUQ Abdominal pain Chronic cholecystitis with phlegmon. Will repeat CT on 08/09/2023 to evaluate for increase in fluid accumulation that may be amenable to percutaneous drainage.  S/p perc cholecystostomy tube out since 10 days. Seen by IR no need for acute chole tube replacement. Surgery consult obtained at the urging of the patient's daughter who feels that he did not do well with the tube. The patient was evaluated by Dr. Maurine [name] who stated that the patient was  not a candidate for surgery, and did not need to have the tube replaced according to most recent CT. He has recommended ongoing IV antibiotics therapy for now. Surgery has recommended repeat CT abdomen tomorrow. Dr. Maurine [name] will follow up with the patient as outpatient and discuss possible surgery then. The patient will be continued on IV Ceftriaxone. Patient currently reports resolution of abdominal pain, patient initiated on clear liquid diet and will advance as tolerated.  Elevated lactic acid level:  Lactic acidosis has resolved with IV hydration. Lactic acid was 0.8 on 08/05/2023.   Hypokalemia- Resolved. Potassium was 3.5 on 08/08/2023. Monitor daily electrolytes.   Hypertension:  The patient is normotensive on prn hydralazine only. Continue to monitor.   HLD (hyperlipidemia) Continue Lipitor   Normocytic anemia:  Hemoglobin has decreased to 9.1. Likely delutional. Monitor.   AAA (abdominal aortic aneurysm) (HCC): This is incidental findings by CT scan. Follow-up with PCP with 1 year scan.   Stroke (HCC) Continue Aspirin, Lipitor   BPH (benign prostatic hyperplasia) Continue Flomax   Depression On Zoloft   I have seen and examined this patient myself. I have spent 32 minutes in his evaluation and care.   Diet:  Diet Orders (From admission, onward)     Start     Ordered   08/07/23 1850  DIET - DYS 1 Room service appropriate? Yes; Fluid consistency: Thin  Diet effective now       Question Answer Comment  Room service appropriate? Yes   Fluid consistency: Thin  08/07/23 1849           DVT prophylaxis: SCDs Start: 08/04/23 1935  Level of care: med-surg   Code Status: Limited: Do not attempt resuscitation (DNR) -DNR-LIMITED -Do Not Intubate/DNI   Subjective: Patient is seen and examined today morning.The patient is resting comfortably. No new complaints.  Physical Exam: Vitals:   08/07/23 0757 08/07/23 1629 08/07/23 2005 08/08/23 0918  BP: 135/74 105/63  126/69 124/77  Pulse: (!) 51 63 62 (!) 53  Resp: 16 16 15 16   Temp: 98 F (36.7 C) 97.6 F (36.4 C) 97.7 F (36.5 C) 97.7 F (36.5 C)  TempSrc:  Oral    SpO2: 96% 96% 98% 100%  Weight:      Height:        Exam:  Constitutional:  The patient is awake, alert, and oriented x 3. No acute distress. Respiratory:  No increased work of breathing. No wheezes, rales, or rhonchi No tactile fremitus Cardiovascular:  Regular rate and rhythm No murmurs, ectopy, or gallups. No lateral PMI. No thrills. Abdomen:  Abdomen is soft, non-tender, non-distended No hernias, masses, or organomegaly Normoactive bowel sounds.  Musculoskeletal:  No cyanosis, clubbing, or edema Skin:  No rashes, lesions, ulcers palpation of skin: no induration or nodules Neurologic:  CN 2-12 intact Sensation all 4 extremities intact Psychiatric:  Mental status Mood, affect appropriate Orientation to person, place, time  judgment and insight appear intact   Data Reviewed:      Latest Ref Rng & Units 08/08/2023    4:31 AM 08/07/2023    3:38 AM 08/06/2023    5:32 AM  CBC  WBC 4.0 - 10.5 K/uL 6.0  5.5  6.0   Hemoglobin 13.0 - 17.0 g/dL 9.1  9.1  9.6   Hematocrit 39.0 - 52.0 % 28.6  28.8  30.5   Platelets 150 - 400 K/uL 327  321  330       Latest Ref Rng & Units 08/08/2023    4:31 AM 08/07/2023    3:38 AM 08/06/2023    5:32 AM  BMP  Glucose 70 - 99 mg/dL 79  80  65   BUN 8 - 23 mg/dL 8  10  13    Creatinine 0.61 - 1.24 mg/dL 1.61  0.96  0.45   Sodium 135 - 145 mmol/L 137  139  141   Potassium 3.5 - 5.1 mmol/L 3.5  3.6  3.3   Chloride 98 - 111 mmol/L 106  108  109   CO2 22 - 32 mmol/L 25  25  21    Calcium 8.9 - 10.3 mg/dL 8.3  8.4  8.5    No results found.   Family Communication: no family at bedside. Discussed with daughter over phone, asked about surgical option.  Disposition: Status is: Inpatient Remains inpatient appropriate because: pain control, surgery eval.  Planned Discharge  Destination: Skilled nursing facility with PT/OT.     Time spent: 30 minutes  Author: Florella Mcneese, DO 08/08/2023 2:11 PM Secure chat 7am to 7pm For on call review [email or web address].

## 2023-08-09 DIAGNOSIS — R1011 Right upper quadrant pain: Secondary | ICD-10-CM | POA: Diagnosis not present

## 2023-08-09 DIAGNOSIS — K810 Acute cholecystitis: Secondary | ICD-10-CM | POA: Diagnosis not present

## 2023-08-09 LAB — CULTURE, BLOOD (SINGLE): Culture: NO GROWTH

## 2023-08-09 MED ORDER — PANTOPRAZOLE SODIUM 40 MG PO TBEC: 40.0000 mg | DELAYED_RELEASE_TABLET | Freq: Every day | ORAL | 0 refills | Status: DC

## 2023-08-09 MED ORDER — METRONIDAZOLE 500 MG PO TABS: 500.0000 mg | ORAL_TABLET | Freq: Three times a day (TID) | ORAL | 0 refills | Status: AC

## 2023-08-09 MED ORDER — AMOXICILLIN-POT CLAVULANATE 875-125 MG PO TABS
1.0000 | ORAL_TABLET | Freq: Two times a day (BID) | ORAL | Status: DC
  Administered 2023-08-09: 1 via ORAL
  Filled 2023-08-09: qty 1

## 2023-08-09 MED ORDER — AMOXICILLIN-POT CLAVULANATE 875-125 MG PO TABS: 1.0000 | ORAL_TABLET | Freq: Two times a day (BID) | ORAL | 0 refills | Status: DC

## 2023-08-09 NOTE — Progress Notes (Signed)
 EMS present at bedside to transport pt back to SNF. Pt in NAD. Pt wears placed on him to accompany him back to facility. VSS.

## 2023-08-09 NOTE — Discharge Summary (Addendum)
 Physician Discharge Summary   Patient: Thomas [name] MRN: [id number] DOB: 06/21/1930  Admit date:     08/04/2023  Discharge date: 08/09/23  Discharge Physician: Fran Lowes   PCP: Barbette Reichmann, MD   Recommendations at discharge:    Discharge to home Follow up with General surgery in one week as directed. Follow up with PCP in 7-10 days.  Discharge Diagnoses: Principal Problem:   Abdominal pain Active Problems:   Elevated lactic acid level   Hypertension   HLD (hyperlipidemia)   Normocytic anemia   AAA (abdominal aortic aneurysm) (HCC)   Stroke (HCC)   BPH (benign prostatic hyperplasia)   Depression   Cholecystitis, acute   Phlegmon  Resolved Problems:   * No resolved hospital problems. *  Hospital Course:  Thomas [name] is a 89 y.o. male with medical history significant of HTN, HLD, stroke, GERD, depression, BPH, carotid artery stenosis, cholecystitis, cholecystostomy tube pulled out and replaced 4 times, with last time 10 days ago when it was not reinserted presented with abdominal pain.    CT abdomen showed Interval removal of the percutaneous cholecystostomy with an ill-defined inflammatory area in the right anterior upper abdominal wall which may represent a hematoma or a phlegmon. A fistulous tract extending from the gallbladder fundus to the anterior abdominal wall and skin is not excluded.   IR advised there is not an acute need to replace the drain. Surgery team consulted per daughter request. They agree with not replacing tube at this time, but want to repeat CT of the abdomen tomorrow to evaluate if there has been re-accumulation of fluid that would be amenable to drainage.    General surgery recommends continuation of IV antibiotics at this time.   He has been evaluated by PT. The patient required max assist to sit on the edge of the bed, and was only able to tolerate it for about 1 minutes. The patient is amenable to return to [facility], but will need to  be able to receive ongoing PT/OT there.   The patient is appropriate for discharge back to [facility].      Assessment and Plan: RUQ Abdominal pain Chronic cholecystitis with phlegmon. Will repeat CT on 08/09/2023 to evaluate for increase in fluid accumulation that may be amenable to percutaneous drainage.  S/p perc cholecystostomy tube out since 10 days. Seen by IR no need for acute chole tube replacement. Surgery consult obtained at the urging of the patient's daughter who feels that he did not do well with the tube. The patient was evaluated by Dr. Maurine Minister who stated that the patient was not a candidate for surgery, and did not need to have the tube replaced according to most recent CT. He has recommended ongoing IV antibiotics therapy for now. Surgery has recommended repeat CT abdomen tomorrow. Dr. Maurine Minister will follow up with the patient as outpatient and discuss possible surgery then. The patient will be continued on IV Ceftriaxone. Patient currently reports resolution of abdominal pain, patient initiated on clear liquid diet and will advance as tolerated.   Elevated lactic acid level:  Lactic acidosis has resolved with IV hydration. Lactic acid was 0.8 on 08/05/2023.    Hypokalemia- Resolved. Potassium was 3.5 on 08/08/2023. Monitor daily electrolytes.   Hypertension:  The patient is normotensive on prn hydralazine only. Continue to monitor.   HLD (hyperlipidemia) Continue Lipitor   Normocytic anemia:  Hemoglobin has decreased to 9.1. Likely delutional. Monitor.    AAA (abdominal aortic aneurysm) (HCC): This  is incidental findings by CT scan. Follow-up with PCP with 1 year scan.   Stroke (HCC) Continue Aspirin, Lipitor   BPH (benign prostatic hyperplasia) Continue Flomax   Depression On Zoloft          Consultants: General Surgery Procedures performed: None  Disposition: Long term care facility Diet recommendation:  Discharge Diet Orders (From admission,  onward)     Start     Ordered   08/09/23 0000  Diet - low sodium heart healthy        08/09/23 1425           Dysphagia type 1 Thin Liquid DISCHARGE MEDICATION: Allergies as of 08/09/2023   No Known Allergies      Medication List     STOP taking these medications    finasteride 5 MG tablet Commonly known as: PROSCAR   guaifenesin 100 MG/5ML syrup Commonly known as: ROBITUSSIN   omeprazole 20 MG tablet Commonly known as: PRILOSEC OTC Replaced by: pantoprazole 40 MG tablet       TAKE these medications    acetaminophen 325 MG tablet Commonly known as: TYLENOL Take 2 tablets (650 mg total) by mouth every 6 (six) hours as needed for fever, headache or moderate pain. What changed: how much to take   amoxicillin-clavulanate 875-125 MG tablet Commonly known as: AUGMENTIN Take 1 tablet by mouth every 12 (twelve) hours.   aspirin 81 MG chewable tablet Chew 1 tablet (81 mg total) by mouth daily.   atorvastatin 40 MG tablet Commonly known as: LIPITOR Take 1 tablet (40 mg total) by mouth daily.   bisacodyl 5 MG EC tablet Commonly known as: DULCOLAX Take 2 tablets (10 mg total) by mouth at bedtime.   bisacodyl 10 MG suppository Commonly known as: DULCOLAX Place 1 suppository (10 mg total) rectally daily as needed for severe constipation.   cyanocobalamin 1000 MCG tablet Commonly known as: VITAMIN B12 Take 1,000 mcg by mouth daily.   divalproex 250 MG 24 hr tablet Commonly known as: DEPAKOTE ER Take 250 mg by mouth daily.   LORazepam 0.5 MG tablet Commonly known as: ATIVAN Take 0.5 mg by mouth 2 (two) times daily.   metroNIDAZOLE 500 MG tablet Commonly known as: Flagyl Take 1 tablet (500 mg total) by mouth 3 (three) times daily for 6 days.   pantoprazole 40 MG tablet Commonly known as: PROTONIX Take 1 tablet (40 mg total) by mouth daily. Start taking on: August 10, 2023 Replaces: omeprazole 20 MG tablet   polyethylene glycol 17 g packet Commonly  known as: MIRALAX / GLYCOLAX Take 17 g by mouth 2 (two) times daily.   polyvinyl alcohol 1.4 % ophthalmic solution Commonly known as: LIQUIFILM TEARS Place 2 drops into both eyes as needed for dry eyes.   sertraline 25 MG tablet Commonly known as: ZOLOFT Take 1 tablet by mouth daily.   tamsulosin 0.4 MG Caps capsule Commonly known as: FLOMAX Take 1 capsule (0.4 mg total) by mouth daily.   traZODone 50 MG tablet Commonly known as: DESYREL Take 50 mg by mouth at bedtime.               Discharge Care Instructions  (From admission, onward)           Start     Ordered   08/09/23 0000  Discharge wound care:       Comments: Wound care  Daily      Comments: Please Change RUQ wound daily w Dry dressing   08/09/23  1425            Follow-up Information     Kandis Cocking, MD Follow up in 1 week(s).   Specialty: General Surgery Contact information: [address] [place] [address] [phone]                Discharge Exam: Ceasar Mons Weights   08/04/23 1347  Weight: 65.8 kg   Exam:  Constitutional:  The patient is awake, alert, and oriented x 3. No acute distress. Respiratory:  No increased work of breathing. No wheezes, rales, or rhonchi No tactile fremitus Cardiovascular:  Regular rate and rhythm No murmurs, ectopy, or gallups. No lateral PMI. No thrills. Abdomen:  Abdomen is soft, non-tender, non-distended No hernias, masses, or organomegaly Normoactive bowel sounds.  Musculoskeletal:  No cyanosis, clubbing, or edema Skin:  No rashes, lesions, ulcers palpation of skin: no induration or nodules Neurologic:  CN 2-12 intact Sensation all 4 extremities intact Psychiatric:  Mental status Mood, affect appropriate Orientation to person, place, time  judgment and insight appear intact   Condition at discharge: fair  The results of significant diagnostics from this hospitalization (including imaging, microbiology, ancillary  and laboratory) are listed below for reference.   Imaging Studies: CT ABDOMEN PELVIS W CONTRAST Result Date: 08/04/2023 CLINICAL DATA:  Right upper quadrant abdominal pain. History of percutaneous cholecystostomy. EXAM: CT ABDOMEN AND PELVIS WITH CONTRAST TECHNIQUE: Multidetector CT imaging of the abdomen and pelvis was performed using the standard protocol following bolus administration of intravenous contrast. RADIATION DOSE REDUCTION: This exam was performed according to the departmental dose-optimization program which includes automated exposure control, adjustment of the mA and/or kV according to patient size and/or use of iterative reconstruction technique. CONTRAST:  OMNIPAQUE IOHEXOL 300 MG/ML  SOLN COMPARISON:  CT abdomen pelvis dated 04/21/2023. FINDINGS: Lower chest: Bibasilar interstitial coarsening and atelectasis/scarring. No intra-abdominal free air or free fluid. Hepatobiliary: The liver is unremarkable. No biliary dilatation. The gallbladder is mildly distended. Noncalcified stone or sludge in the gallbladder. The previously seen percutaneous cholecystostomy is not visualized. There is stranding of the pericholecystic fat adjacent to the gallbladder fundus. There is thickening of the gallbladder fundus. Ill-defined inflammatory tissue extends from the gallbladder fundus to the anterior abdominal wall corresponding to the track of the cholecystostomy tube. There is an ill-defined 3.5 x 2.6 cm inflammatory area in the anterior right upper abdominal wall with a low attenuating central area extending to the skin. This may represent a small hematoma but a phlegmon/developing abscess is not excluded. Ultrasound may provide better evaluation. Pancreas: Unremarkable. No pancreatic ductal dilatation or surrounding inflammatory changes. Spleen: Normal in size without focal abnormality. Adrenals/Urinary Tract: The adrenal glands are unremarkable. Bilateral renal cysts measure up to 6 cm in the  inferior pole of the left kidney. There is no hydronephrosis on either side. There is symmetric enhancement and excretion of contrast by both kidneys. The visualized ureters appear unremarkable. The urinary bladder is minimally distended and grossly unremarkable. There is apparent diffuse thickening of the bladder wall which may be partly related to underdistention. Cystitis is not excluded. Correlation with urinalysis recommended. Stomach/Bowel: Small hiatal hernia. There is no bowel obstruction or active inflammation. No CT evidence of acute appendicitis. Vascular/Lymphatic: Advanced aortoiliac atherosclerotic disease. There is a 4.1 cm infrarenal abdominal aortic aneurysm. The IVC is unremarkable. No portal venous gas. There is no adenopathy. Reproductive: Mildly enlarged prostate gland with median lobe hypertrophy. Other: Small fat containing umbilical hernia. Musculoskeletal: Osteopenia with degenerative changes.  No acute osseous pathology. IMPRESSION: 1. Interval removal of the percutaneous cholecystostomy with an ill-defined inflammatory area in the right anterior upper abdominal wall which may represent a hematoma or a phlegmon. A fistulous tract extending from the gallbladder fundus to the anterior abdominal wall and skin is not excluded. 2. No bowel obstruction. 3. A 4.1 cm infrarenal abdominal aortic aneurysm. Recommend follow-up CT or MR as appropriate in 12 months and referral to or continued care with vascular specialist. (Ref.: J Vasc Surg. 2018; 67:2-77 and J Am Coll Radiol 2013;10(10):789-794.) Electronically Signed   By: Elgie Collard M.D.   On: 08/04/2023 17:51    Microbiology: Results for orders placed or performed during the hospital encounter of 08/04/23  Culture, blood (single)     Status: None   Collection Time: 08/04/23  6:35 PM   Specimen: BLOOD  Result Value Ref Range Status   Specimen Description BLOOD BLOOD RIGHT HAND  Final   Special Requests   Final    BOTTLES DRAWN  AEROBIC AND ANAEROBIC Blood Culture results may not be optimal due to an inadequate volume of blood received in culture bottles   Culture   Final    NO GROWTH 5 DAYS Performed at [facility], [address] [place], [place] [address]    Report Status 08/09/2023 FINAL  Final  MRSA Next Gen by PCR, Nasal     Status: Abnormal   Collection Time: 08/05/23  3:49 PM   Specimen: Nasal Mucosa; Nasal Swab  Result Value Ref Range Status   MRSA by PCR Next Gen DETECTED (A) NOT DETECTED Final    Comment: RESULT CALLED TO, READ BACK BY AND VERIFIED WITH: ELONA ALLA @1741  on 08/05/23 SKL (NOTE) The GeneXpert MRSA Assay (FDA approved for NASAL specimens only), is one component of a comprehensive MRSA colonization surveillance program. It is not intended to diagnose MRSA infection nor to guide or monitor treatment for MRSA infections. Test performance is not FDA approved in patients less than 23 years old. Performed at [facility], [address], [place], [place] [address]   Urine Culture     Status: Abnormal   Collection Time: 08/05/23  8:41 PM   Specimen: Urine, Random  Result Value Ref Range Status   Specimen Description   Final    URINE, RANDOM Performed at [facility], [address], [place], [place] [address]    Special Requests   Final    NONE Reflexed from [id number] Performed at [facility], [address] [place], [place] [address]    Culture (A)  Final    <10,000 COLONIES/mL INSIGNIFICANT GROWTH Performed at [facility], [address] [place], [place] [address]    Report Status 08/07/2023 FINAL  Final    Labs: CBC: Recent Labs  Lab 08/04/23 1424 08/05/23 0424 08/06/23 0532 08/07/23 0338 08/08/23 0431  WBC 11.0* 6.4 6.0 5.5 6.0  NEUTROABS 8.6*  --   --  3.5  --   HGB 10.9* 8.1* 9.6* 9.1* 9.1*  HCT 34.7* 26.2* 30.5* 28.8* 28.6*  MCV 89.4 91.0 86.6 87.5 87.2  PLT 373 281 330 321 327   Basic Metabolic Panel: Recent  Labs  Lab 08/04/23 1424 08/05/23 0424 08/06/23 0532 08/07/23 0338 08/08/23 0431  NA 139 139 141 139 137  K 3.6 3.1* 3.3* 3.6 3.5  CL 106 107 109 108 106  CO2 20* 22 21* 25 25  GLUCOSE 130* 84 65* 80 79  BUN 18 14 13  10 8  CREATININE 0.86 0.69 0.70 0.70 0.71  CALCIUM 8.6* 8.2* 8.5* 8.4* 8.3*   Liver Function Tests: Recent Labs  Lab 08/04/23 1424 08/07/23 0338  AST 20 14*  ALT 12 10  ALKPHOS 94 67  BILITOT 0.7 0.5  PROT 7.2 5.7*  ALBUMIN 2.7* 2.1*   CBG: No results for input(s): "GLUCAP" in the last 168 hours.  Discharge time spent: greater than 30 minutes.  Signed: Fran Lowes, DO [facility] 08/09/2023

## 2023-08-09 NOTE — TOC Progression Note (Signed)
 Transition of Care [facility]) - Progression Note    Patient Details  Name: ABDULHADI STOPA MRN: [id number] Date of Birth: 12-23-1930  [facility] CM/SW Contact  Marlowe Sax, RN Phone Number: 08/09/2023, 3:55 PM  Clinical Narrative:    Called susan and was not able to reach no Vm to leave a message patient returning to [facility]  EMS called    Expected Discharge Plan: Skilled Nursing Facility Barriers to Discharge: Continued Medical Work up  Expected Discharge Plan and Services   Discharge Planning Services: CM Consult   Living arrangements for the past 2 months: Skilled Nursing Facility Expected Discharge Date: 08/09/23                                     Social Determinants of Health (SDOH) Interventions SDOH Screenings   Food Insecurity: Patient Unable To Answer (08/05/2023)  Housing: Patient Unable To Answer (08/05/2023)  Transportation Needs: Patient Unable To Answer (08/05/2023)  Utilities: Patient Unable To Answer (08/05/2023)  Financial Resource Strain: Low Risk  (10/12/2022)   Received from [facility]  Social Connections: Patient Unable To Answer (08/05/2023)  Tobacco Use: Low Risk  (08/05/2023)  Health Literacy: High Risk (03/12/2023)   Received from [facility]    Readmission Risk Interventions     No data to display

## 2023-08-09 NOTE — Care Management Important Message (Signed)
 Important Message  Patient Details  Name: Thomas [name] MRN: [id number] Date of Birth: 1930-11-11   Important Message Given:  Yes - Medicare IM     Cristela Blue, CMA 08/09/2023, 11:30 AM

## 2023-08-09 NOTE — Progress Notes (Signed)
 CC: Cholecystitis Subjective: Patient reports doing well.  He is pleasantly confused but denies any abdominal pain.  He reports that he was able to drink some and this went well.  Objective: Vital signs in last 24 hours: Temp:  [97.5 F (36.4 C)-98.3 F (36.8 C)] 98.3 F (36.8 C) (03/31 0838) Pulse Rate:  [61-64] 62 (03/31 0838) Resp:  [16-18] 16 (03/31 0838) BP: (103-163)/(64-98) 163/98 (03/31 0838) SpO2:  [96 %-100 %] 97 % (03/31 0838) Last BM Date : 08/05/23  Intake/Output from previous day: 03/30 0701 - 03/31 0700 In: 1278.5 [P.O.:150; I.V.:628.5; IV Piggyback:500.1] Out: 1000 [Urine:1000] Intake/Output this shift: No intake/output data recorded.  Physical exam: Constitutional :  alert, cooperative, appears stated age, and no distress  Lymphatics/Throat:  no asymmetry, masses, or scars  Respiratory:  clear to auscultation bilaterally  Cardiovascular:  regular rate and rhythm  Gastrointestinal: Area of previous cholecystostomy tube with some induration to it.  There is minimal drainage.  There is no surrounding erythema or fluctuance.  No other tenderness or distention on exam.  Musculoskeletal: Steady movement  Skin: Cool and moist  Psychiatric: Normal affect, non-agitated, not confused    \  Lab Results: CBC  Recent Labs    08/07/23 0338 08/08/23 0431  WBC 5.5 6.0  HGB 9.1* 9.1*  HCT 28.8* 28.6*  PLT 321 327   BMET Recent Labs    08/07/23 0338 08/08/23 0431  NA 139 137  K 3.6 3.5  CL 108 106  CO2 25 25  GLUCOSE 80 79  BUN 10 8  CREATININE 0.70 0.71  CALCIUM 8.4* 8.3*   PT/INR No results for input(s): "LABPROT", "INR" in the last 72 hours.  ABG No results for input(s): "PHART", "HCO3" in the last 72 hours.  Invalid input(s): "PCO2", "PO2"  Studies/Results: No results found.  Anti-infectives: Anti-infectives (From admission, onward)    Start     Dose/Rate Route Frequency Ordered Stop   08/05/23 1800  cefTRIAXone (ROCEPHIN) 2 g in sodium  chloride 0.9 % 100 mL IVPB        2 g 200 mL/hr over 30 Minutes Intravenous Every 24 hours 08/04/23 1931     08/05/23 0600  metroNIDAZOLE (FLAGYL) IVPB 500 mg        500 mg 100 mL/hr over 60 Minutes Intravenous Every 12 hours 08/04/23 1931     08/04/23 1815  cefTRIAXone (ROCEPHIN) 2 g in sodium chloride 0.9 % 100 mL IVPB        2 g 200 mL/hr over 30 Minutes Intravenous Once 08/04/23 1813 08/04/23 1907   08/04/23 1815  metroNIDAZOLE (FLAGYL) IVPB 500 mg        500 mg 100 mL/hr over 60 Minutes Intravenous  Once 08/04/23 1813 08/04/23 2222       Assessment/Plan:  Chronic cholecystitis with phlegmon within the right upper quadrant after removal of cholecystostomy tube.  Continue antibiotic therapy.  There is some swelling at the site of previous cholecystostomy tube but there is no evidence of fluctuance so I do not think that we need to repeat a CT scan today.  Continue on antibiotics and continue diet as tolerated.  We will monitor the site of the cholecystostomy tube.  He will need follow-up with me to discuss role of cholecystectomy  08/09/2023

## 2023-08-19 ENCOUNTER — Ambulatory Visit (INDEPENDENT_AMBULATORY_CARE_PROVIDER_SITE_OTHER): Admitting: General Surgery

## 2023-08-19 VITALS — BP 114/84 | HR 86 | Temp 97.9°F | Ht 70.0 in | Wt 165.0 lb

## 2023-08-19 DIAGNOSIS — K810 Acute cholecystitis: Secondary | ICD-10-CM | POA: Diagnosis not present

## 2023-08-19 NOTE — Patient Instructions (Signed)
   Cholecystitis Cholecystitis is irritation and swelling (inflammation) of the gallbladder. The gallbladder: Is an organ that is shaped like a pear. Is under the liver on the right side of the body. Stores bile. Bile helps the body break down (digest) the fats in food. This condition can occur all of a sudden. It needs to be treated. What are the causes? This condition may be caused by stones or lumps that form in the gallbladder (gallstones). Gallstones can block the tube (duct) that carries bile out of your gallbladder. Other causes include: Damage to the gallbladder due to less blood flow. Germs in the bile ducts. Scars, kinks, or adhesions in the bile ducts. Abnormal growths (tumors) in the liver, pancreas, or gallbladder. What increases the risk? You are more likely to develop this condition if: You are male and between the ages of 24-62. You take birth control pills. You use estrogen. You take certain medicines that make you more likely to develop gallstones. You are overweight (obese). You have a very bad reaction to an infection (sepsis). You have been hospitalized due to a serious condition, such as a burn or illness. You have not eaten or drank for a long time. What are the signs or symptoms? Symptoms of this condition include: Pain in the upper right part of the belly (abdomen). A lump over the gallbladder. Bloating in the belly. Feeling sick to your stomach (nauseous). Vomiting. Fever. Chills. How is this treated? This condition may be treated with: Medicines to treat pain. Giving fluids through an IV tube. Not eating or drinking (fasting). Antibiotic medicines. Surgery to take out your gallbladder. Gallbladder drainage. Follow these instructions at home: Medicines  Take over-the-counter and prescription medicines only as told by your doctor. If you were prescribed an antibiotic medicine, take it as told by your doctor. Do not stop taking it even if you  start to feel better. General instructions Follow instructions from your doctor about what to eat or drink. Do not eat or drink anything that makes you sick again. Do not smoke or use any products that contain nicotine or tobacco. If you need help quitting, ask your doctor. Keep all follow-up visits. Contact a doctor if: You have pain and your medicine does not help. You have a fever. Get help right away if: Your pain moves to: Another part of your belly. Your back. Your symptoms do not go away. You have new symptoms. These symptoms may be an emergency. Get help right away. Call 911. Do not wait to see if the symptoms will go away. Do not drive yourself to the hospital. Summary This condition may be caused by stones or lumps that form in the gallbladder (gallstones). A common symptom is pain in your belly. This condition may be treated with surgery to take out your gallbladder. Follow instructions from your doctor about what to eat or drink. This information is not intended to replace advice given to you by your health care provider. Make sure you discuss any questions you have with your health care provider. Document Revised: 10/29/2020 Document Reviewed: 10/29/2020 Elsevier Patient Education  2024 [employer].

## 2023-08-23 NOTE — Progress Notes (Signed)
 Patient ID: Thomas [name], male   DOB: [birth year], 89 y.o.   MRN: [id number] CC: Cholecystitis History of Present Illness Thomas [name] is a 89 y.o. male with past medical history significant for peripheral vascular disease, history of stroke and dementia who presents in consultation for cholecystitis.  The patient was initially seen by me in November of last year after undergoing a carotid endarterectomy.  At that time it was concerning that he had a stroke after surgery however workup revealed that he had cholecystitis that was causing him to have altered mental status..  Since he was on dual antiplatelet therapy at that time and had just had a stroke the decision was made to have a cholecystostomy tube placed.  Since then he has had numerous exchanges of his cholecystostomy tube and has actually had a fall out twice.  Will last time he was seen in the hospital approximately 2 weeks ago his cholecystostomy tube had fallen out and he had imaging evidence that was concerning for cholecystitis.  At that time the decision was made to treat him with antibiotics and forego a cholecystostomy tube.  He is demented so much of the history is obtained by his son-in-law who is with him today.  He says that overall the patient has been doing okay.  He does not get the sense that the patient is having any right upper quadrant pain.  There is no drainage from the area where his cholecystostomy tube was.  He has not been having any fevers or chills.  Past Medical History Past Medical History:  Diagnosis Date   Arthritis    Carpal tunnel syndrome on both sides    GERD (gastroesophageal reflux disease)    Hyperlipidemia    Hypertension        Past Surgical History:  Procedure Laterality Date   CHOLECYSTECTOMY     3/25   ENDARTERECTOMY Right 03/19/2023   Procedure: ENDARTERECTOMY CAROTID;  Surgeon: Jackquelyn Mass, MD;  Location: [facility] ORS;  Service: Vascular;  Laterality: Right;   INGUINAL HERNIA REPAIR  2001    IR EXCHANGE BILIARY DRAIN  05/16/2023   IR EXCHANGE BILIARY DRAIN  05/28/2023   IR PERC CHOLECYSTOSTOMY  04/17/2023   REPLACEMENT TOTAL KNEE Bilateral     No Known Allergies  Current Outpatient Medications  Medication Sig Dispense Refill   acetaminophen (TYLENOL) 325 MG tablet Take 2 tablets (650 mg total) by mouth every 6 (six) hours as needed for fever, headache or moderate pain. (Patient taking differently: Take 325 mg by mouth every 6 (six) hours as needed for fever, headache or moderate pain (pain score 4-6).)     amoxicillin-clavulanate (AUGMENTIN) 875-125 MG tablet Take 1 tablet by mouth every 12 (twelve) hours. 8 tablet 0   aspirin 81 MG chewable tablet Chew 1 tablet (81 mg total) by mouth daily.     atorvastatin (LIPITOR) 40 MG tablet Take 1 tablet (40 mg total) by mouth daily.     bisacodyl (DULCOLAX) 10 MG suppository Place 1 suppository (10 mg total) rectally daily as needed for severe constipation.     bisacodyl (DULCOLAX) 5 MG EC tablet Take 2 tablets (10 mg total) by mouth at bedtime.     cyanocobalamin (VITAMIN B12) 1000 MCG tablet Take 1,000 mcg by mouth daily.     divalproex (DEPAKOTE ER) 250 MG 24 hr tablet Take 250 mg by mouth daily.     LORazepam (ATIVAN) 0.5 MG tablet Take 0.5 mg by mouth 2 (two) times daily.  pantoprazole (PROTONIX) 40 MG tablet Take 1 tablet (40 mg total) by mouth daily. 30 tablet 0   polyethylene glycol (MIRALAX / GLYCOLAX) 17 g packet Take 17 g by mouth 2 (two) times daily.     polyvinyl alcohol (LIQUIFILM TEARS) 1.4 % ophthalmic solution Place 2 drops into both eyes as needed for dry eyes.     sertraline (ZOLOFT) 25 MG tablet Take 1 tablet by mouth daily.     tamsulosin (FLOMAX) 0.4 MG CAPS capsule Take 1 capsule (0.4 mg total) by mouth daily. 90 capsule 3   traZODone (DESYREL) 50 MG tablet Take 50 mg by mouth at bedtime.     No current facility-administered medications for this visit.    Family History Family History  Problem Relation Age  of Onset   Cancer Sister        Liver cancer   Melanoma Sister    Cancer Brother        Pancreatic   Cancer Brother        Lung cancer   Heart disease Brother        Social History Social History   Tobacco Use   Smoking status: Never   Smokeless tobacco: Never  Vaping Use   Vaping status: Never Used  Substance Use Topics   Alcohol use: Never   Drug use: Never        ROS Full ROS of systems performed and is otherwise negative there than what is stated in the HPI  Physical Exam Blood pressure 114/84, pulse 86, temperature 97.9 F (36.6 C), temperature source Oral, height 5\' 10"  (1.778 m), weight 165 lb (74.8 kg).  The patient is only alert to his name, appears frail and cannot move from the wheelchair, abdomen is soft and nondistended.  Where the cholecystostomy tube was there is no drainage from the area today.  There is no surrounding cellulitis.  Data Reviewed I reviewed his last imaging that was concerning for cholecystitis.  He also has had tube exchanges and the last time he had a tube exchange the cystic duct was patent consistent with noncholecystitis.  On his CT scan from the hospital he did have a fluid coalescence in the subcutaneous tissue but there is no drainage on exam today.  I have personally reviewed the patient's imaging and medical records.    Assessment/Plan    Is a 89 year old with significant medical comorbidities who has had a cholecystostomy tube and presents in consultation today to talk about potential cholecystitis.  I shared the Nischal hip risk calculator with the family that showed a risk of about 15% risk of death and significant risk for major complications.  I discussed with the patient's son-in-law that he has had multiple tube exchanges but the cystic duct when last studied appeared open.  He is at extreme risk of complications given his age and his functional status.  We agree that we will see how he does over the next several weeks and if  he does not have any concerns for cholecystitis we will treat these episodes nonoperatively.    Barrett Lick

## 2023-09-16 ENCOUNTER — Ambulatory Visit (INDEPENDENT_AMBULATORY_CARE_PROVIDER_SITE_OTHER): Admitting: General Surgery

## 2023-09-16 ENCOUNTER — Encounter: Payer: Self-pay | Admitting: General Surgery

## 2023-09-16 VITALS — BP 98/62 | HR 89 | Ht 68.0 in | Wt 160.0 lb

## 2023-09-16 DIAGNOSIS — K810 Acute cholecystitis: Secondary | ICD-10-CM

## 2023-09-16 NOTE — Patient Instructions (Signed)
 Follow-up with our office as needed.  Please call and ask to speak with a nurse if you develop questions or concerns.    Cholecystitis  Cholecystitis is inflammation of the gallbladder. Cholecystitis is often called a gallbladder attack. The gallbladder is a pear-shaped organ that lies beneath the liver on the right side of the body. The gallbladder stores a fluid that helps the body digest fats (bile). If bile builds up in your gallbladder, your gallbladder becomes inflamed and can develop a serious infection. This condition may occur suddenly. Cholecystitis is a serious condition and requires treatment. What are the causes? The most common cause of this condition is gallstones. Gallstones can block the tube (duct) that carries bile out of your gallbladder. This causes bile to build up. Other causes include: Damage to the gallbladder due to decreased blood flow. Infection in the bile duct. Scars, kinks, or adhesions in the bile duct. Tumors in the liver, pancreas, or gallbladder. What increases the risk? You are more likely to develop this condition if: You are male and between the ages of 34-62. You take birth control pills or use estrogen. You take certain medicines that increase your likelihood of developing gallstones. You are obese. You have a severe reaction to an infection (sepsis). The infection may be bacterial, fungal, parasitic, or viral. You have been hospitalized due to trauma, such as a burn or critical illness. You have not eaten or drank for a long period of time (prolonged fasting). What are the signs or symptoms? Symptoms of this condition include: Tenderness in the upper right part of the abdomen. A lump over the gallbladder. Bloating in the abdomen. Nausea. Vomiting. Fever. Chills. How is this diagnosed? This condition is diagnosed with a medical history and physical exam. You may also have other tests, including: Imaging tests, such as: An ultrasound of  the abdomen. A CT scan of the abdomen. A gallbladder nuclear scan (HIDA cholescintigraphy). This allows your health care provider to see the bile moving from your liver to your gallbladder and to your small intestine. MRI. Blood tests, such as: A complete blood count. The white blood cell count may be higher than normal. C-reactive protein (CRP) test. The level of CRP will be higher if there is an infection. Liver function tests. Certain types of gallstones cause some results to be higher than normal. How is this treated? Treatment may include: Pain medicine and IV fluids. Not eating or drinking (fasting). This helps to take stress off of your gallbladder. Antibiotic medicine. This is usually given through an IV. Surgery to remove your gallbladder (cholecystectomy). Gallbladder drainage. In this procedure, a tube is placed into the gallbladder to drain fluid. This may be done for people with moderate to severe cholecystitis who cannot have surgery. Follow these instructions at home: Medicines  Take over-the-counter and prescription medicines only as told by your health care provider. If you were prescribed an antibiotic medicine, take it as told by your health care provider. Do not stop taking the antibiotic even if you start to feel better. General instructions Follow instructions from your health care provider about what to eat or drink. When you are allowed to eat, avoid eating or drinking anything that triggers your symptoms. Do not use any products that contain nicotine or tobacco. These products include cigarettes, chewing tobacco, and vaping devices, such as e-cigarettes. If you need help quitting, ask your health care provider. Keep all follow-up visits. This is important. Contact a health care provider if: Your pain  is not controlled with medicine. You have a fever. Get help right away if: Your pain moves to another part of your abdomen or to your back. You continue to have  symptoms or you develop new symptoms even with treatment. These symptoms may be an emergency. Get help right away. Call 911. Do not wait to see if the symptoms will go away. Do not drive yourself to the hospital. Summary Cholecystitis is inflammation of the gallbladder. The most common cause of this condition is gallstones. Gallstones can block the tube (duct) that carries bile out of your gallbladder. Common symptoms include tenderness in the abdomen, nausea, vomiting, fever, and chills. This condition is treated with fasting, pain medicine, surgery to remove the gallbladder, antibiotic medicines, and gallbladder drainage. Follow your health care provider's instructions for eating and drinking. Avoid eating anything that triggers your symptoms. This information is not intended to replace advice given to you by your health care provider. Make sure you discuss any questions you have with your health care provider. Document Revised: 10/25/2020 Document Reviewed: 10/29/2020 Elsevier Patient Education  2024 [employer].

## 2023-09-16 NOTE — Progress Notes (Signed)
 Outpatient Surgical Follow Up  09/16/2023  Thomas [name] is an 89 y.o. male.   Chief Complaint  Patient presents with   Follow-up    HPI: Thomas [name] returns today to discuss his cholecystitis.  He was most recently seen by me in clinic and at that time the decision was made to see how he does and not proceed with cholecystectomy.  He was initially seen by me in November with cholecystitis and at that time was on dual antiplatelet therapy and had just had a stroke so he underwent cholecystostomy tube placement.  He did have some difficulty with the cholecystostomy tube as it came out several times and had it replaced.  He was seen in the hospital at that time we elected not to replace it and just treat him with antibiotics.  Since then his son-in-law who sees him almost every day reports that he is doing much better.  He is tolerating a diet and has not had any fevers or chills.  He still has a little bit of drainage from his previous cholecystostomy tube site.  There is no indication that he is having any right upper quadrant pain.  Past Medical History:  Diagnosis Date   Arthritis    Carpal tunnel syndrome on both sides    GERD (gastroesophageal reflux disease)    Hyperlipidemia    Hypertension     Past Surgical History:  Procedure Laterality Date   ENDARTERECTOMY Right 03/19/2023   Procedure: ENDARTERECTOMY CAROTID;  Surgeon: Jackquelyn Mass, MD;  Location: [facility] ORS;  Service: Vascular;  Laterality: Right;   INGUINAL HERNIA REPAIR  2001   IR EXCHANGE BILIARY DRAIN  05/16/2023   IR EXCHANGE BILIARY DRAIN  05/28/2023   IR PERC CHOLECYSTOSTOMY  04/17/2023   REPLACEMENT TOTAL KNEE Bilateral     Family History  Problem Relation Age of Onset   Cancer Sister        Liver cancer   Melanoma Sister    Cancer Brother        Pancreatic   Cancer Brother        Lung cancer   Heart disease Brother     Social History:  reports that he has never smoked. He has never been exposed to  tobacco smoke. He has never used smokeless tobacco. He reports that he does not drink alcohol  and does not use drugs.  Allergies: No Known Allergies  Medications reviewed.    ROS Full ROS performed and is otherwise negative other than what is stated in HPI   BP 98/62   Pulse 89   Ht 5\' 8"  (1.727 m)   Wt 160 lb (72.6 kg)   BMI 24.33 kg/m   Physical Exam Patient is in a wheelchair and has difficulty moving his right side.  He is listing to the right side as well.  On exam his abdomen is flat, nondistended and nontender.  At the site of his previous cholecystostomy tube there is no spreading erythema and there is a very small amount of what looks like serous drainage.  I redressed this with a Band-Aid.    No results found for this or any previous visit (from the past 48 hours). No results found.  Assessment/Plan: Patient with a history of cholecystitis status post cholecystostomy tube that has come out.  He has been doing well since this has come out.  Unfortunately he has vascular dementia and is largely better wheelchair-bound.  His functional status has deteriorated significantly since I first  met him in November.  At this time I think surgery would be more harmful than it would be helpful.  We will manage him expectantly.  I discussed warning signs of any infection at the cholecystostomy tube site with the patient's son-in-law.  Should he have any episodes of fever, spreading erythema or lethargy then they should contact us .  I encouraged the patient and his son-in-law to avoid fried and fatty foods as to not irritate the gallbladder.  Otherwise we will see him on an as-needed basis  A total of 35 minutes was spent reviewing the patient's chart, performing history physical and discussing treatment options with the patient and his son-in-law  Severa Daniels, M.D. [facility]

## 2023-09-28 ENCOUNTER — Encounter (INDEPENDENT_AMBULATORY_CARE_PROVIDER_SITE_OTHER): Payer: Self-pay

## 2023-10-07 ENCOUNTER — Ambulatory Visit: Payer: Self-pay | Admitting: Urology

## 2023-11-25 ENCOUNTER — Ambulatory Visit (INDEPENDENT_AMBULATORY_CARE_PROVIDER_SITE_OTHER): Admitting: Urology

## 2023-11-25 VITALS — BP 101/51 | HR 61

## 2023-11-25 DIAGNOSIS — R310 Gross hematuria: Secondary | ICD-10-CM | POA: Diagnosis not present

## 2023-11-25 DIAGNOSIS — R338 Other retention of urine: Secondary | ICD-10-CM | POA: Diagnosis not present

## 2023-11-25 DIAGNOSIS — N401 Enlarged prostate with lower urinary tract symptoms: Secondary | ICD-10-CM

## 2023-11-25 DIAGNOSIS — Z87898 Personal history of other specified conditions: Secondary | ICD-10-CM | POA: Diagnosis not present

## 2023-11-25 LAB — BLADDER SCAN AMB NON-IMAGING

## 2023-11-25 NOTE — Progress Notes (Signed)
   11/25/2023 9:38 AM   Nadara JULIANNA Kerns [birth year] [id number]  Reason for visit: Follow up urinary retention, gross hematuria  HPI: Extremely frail and comorbid 89 year old male here with his son-in-law who provides most of the history today.  He has a history of dementia, stroke, anticoagulation with Plavix , and history of cholecystitis that was managed with a biliary drain.  In November 2024 he traumatically removed his Foley in the ICU and Foley had to be replaced.  CT December 2024 showed a 70 g prostate.  He ultimately passed a voiding trial in January 2025, and remains on Flomax  and finasteride .  Denies any problems with urination since that time.  Voiding spontaneously.  PVR today normal at 99ml.  Denies any gross hematuria, UTIs, or catheter since our last visit.  I personally viewed and interpreted the CT from March 2025 that shows thickened bladder but decompressed, no hydronephrosis.  Continue Flomax  and finasteride , can be filled by PCP/facility Can follow-up with urology as needed, return precautions were discussed  Redell JAYSON Burnet, MD  [facility] [address], [address] [place], [place] [address] [phone]

## 2023-12-02 ENCOUNTER — Ambulatory Visit (INDEPENDENT_AMBULATORY_CARE_PROVIDER_SITE_OTHER): Payer: Medicare Other | Admitting: Vascular Surgery

## 2023-12-02 ENCOUNTER — Encounter (INDEPENDENT_AMBULATORY_CARE_PROVIDER_SITE_OTHER): Payer: Medicare Other

## 2023-12-12 ENCOUNTER — Other Ambulatory Visit: Payer: Self-pay

## 2023-12-12 ENCOUNTER — Emergency Department

## 2023-12-12 ENCOUNTER — Emergency Department
Admission: EM | Admit: 2023-12-12 | Discharge: 2023-12-13 | Disposition: A | Discharge status: Home / Self Care | Attending: Emergency Medicine | Admitting: Emergency Medicine

## 2023-12-12 DIAGNOSIS — S0990XA Unspecified injury of head, initial encounter: Secondary | ICD-10-CM | POA: Diagnosis present

## 2023-12-12 DIAGNOSIS — S0181XA Laceration without foreign body of other part of head, initial encounter: Secondary | ICD-10-CM | POA: Insufficient documentation

## 2023-12-12 DIAGNOSIS — S61411A Laceration without foreign body of right hand, initial encounter: Secondary | ICD-10-CM | POA: Insufficient documentation

## 2023-12-12 DIAGNOSIS — Z23 Encounter for immunization: Secondary | ICD-10-CM | POA: Diagnosis not present

## 2023-12-12 DIAGNOSIS — W050XXA Fall from non-moving wheelchair, initial encounter: Secondary | ICD-10-CM | POA: Diagnosis not present

## 2023-12-12 DIAGNOSIS — Y9289 Other specified places as the place of occurrence of the external cause: Secondary | ICD-10-CM | POA: Insufficient documentation

## 2023-12-12 DIAGNOSIS — F0390 Unspecified dementia without behavioral disturbance: Secondary | ICD-10-CM | POA: Insufficient documentation

## 2023-12-12 DIAGNOSIS — W19XXXA Unspecified fall, initial encounter: Secondary | ICD-10-CM

## 2023-12-12 LAB — COMPREHENSIVE METABOLIC PANEL WITH GFR
ALT: 13 U/L (ref 0–44)
AST: 22 U/L (ref 15–41)
Albumin: 3.3 g/dL — ABNORMAL LOW (ref 3.5–5.0)
Alkaline Phosphatase: 105 U/L (ref 38–126)
Anion gap: 11 (ref 5–15)
BUN: 15 mg/dL (ref 8–23)
CO2: 24 mmol/L (ref 22–32)
Calcium: 9.3 mg/dL (ref 8.9–10.3)
Chloride: 107 mmol/L (ref 98–111)
Creatinine, Ser: 0.81 mg/dL (ref 0.61–1.24)
GFR, Estimated: >60 mL/min (ref >60)
Glucose, Bld: 95 mg/dL (ref 70–99)
Potassium: 3.9 mmol/L (ref 3.5–5.1)
Sodium: 142 mmol/L (ref 135–145)
Total Bilirubin: 0.6 mg/dL (ref 0.0–1.2)
Total Protein: 7.4 g/dL (ref 6.5–8.1)

## 2023-12-12 LAB — CBC WITH DIFFERENTIAL/PLATELET
Abs Immature Granulocytes: 0.02 K/uL (ref 0.00–0.07)
Basophils Absolute: 0 K/uL (ref 0.0–0.1)
Basophils Relative: 0 %
Eosinophils Absolute: 0.3 K/uL (ref 0.0–0.5)
Eosinophils Relative: 4 %
HCT: 37.5 % — ABNORMAL LOW (ref 39.0–52.0)
Hemoglobin: 11.7 g/dL — ABNORMAL LOW (ref 13.0–17.0)
Immature Granulocytes: 0 %
Lymphocytes Relative: 21 %
Lymphs Abs: 1.5 K/uL (ref 0.7–4.0)
MCH: 27.7 pg (ref 26.0–34.0)
MCHC: 31.2 g/dL (ref 30.0–36.0)
MCV: 88.7 fL (ref 80.0–100.0)
Monocytes Absolute: 0.3 K/uL (ref 0.1–1.0)
Monocytes Relative: 4 %
Neutro Abs: 4.9 K/uL (ref 1.7–7.7)
Neutrophils Relative %: 71 %
Platelets: 271 K/uL (ref 150–400)
RBC: 4.23 MIL/uL (ref 4.22–5.81)
RDW: 16.6 % — ABNORMAL HIGH (ref 11.5–15.5)
WBC: 7 K/uL (ref 4.0–10.5)
nRBC: 0 % (ref 0.0–0.2)

## 2023-12-12 MED ORDER — TETANUS-DIPHTH-ACELL PERTUSSIS 5-2.5-18.5 LF-MCG/0.5 IM SUSY
0.5000 mL | PREFILLED_SYRINGE | Freq: Once | INTRAMUSCULAR | Status: AC
  Administered 2023-12-12: 0.5 mL via INTRAMUSCULAR
  Filled 2023-12-12: qty 0.5

## 2023-12-12 NOTE — ED Triage Notes (Signed)
 Pt comes in via [facility] from [facility] after an unwitnessed fall in the dayroom at his facility. According to EMS the patient has some confusion at baseline. Pt is alert and oriented x1. Pt with two lacerations to to the head and a skin tear to the right hand. Bleeding is controlled at this time, Pt is not on blood thinners, and no LOC reported.

## 2023-12-12 NOTE — ED Notes (Signed)
 Pt adjusted in bed. Pt tolerated well.

## 2023-12-12 NOTE — ED Notes (Signed)
 Pt cleaned with bath wipes as tolerated.

## 2023-12-12 NOTE — ED Provider Notes (Signed)
 [facility] Provider Note   Event Date/Time   First MD Initiated Contact with Patient 12/12/23 1806     (approximate) History  Fall  HPI Thomas [name] is a 89 y.o. male with a past medical history of dementia who presents from EMS from [facility] after an unwitnessed fall in the day room at his facility.  Patient was in a wheelchair and ended up on the ground with a small skin tear to the right hand as well as laceration to the head.  Bleeding is controlled at this time as there is a dressing in place.  Patient denies exactly what happened today.  Son at bedside states that he has fallen out of his wheelchair in the past with similar presentation.  Patient only complains of left-sided headache where he has a laceration to the head. ROS: Patient currently denies any vision changes, tinnitus, difficulty speaking, facial droop, sore throat, chest pain, shortness of breath, abdominal pain, nausea/vomiting/diarrhea, dysuria, or weakness/numbness/paresthesias in any extremity   Physical Exam  Triage Vital Signs: ED Triage Vitals  Encounter Vitals Group     BP 12/12/23 1734 (!) 100/54     Girls Systolic BP Percentile --      Girls Diastolic BP Percentile --      Boys Systolic BP Percentile --      Boys Diastolic BP Percentile --      Pulse Rate 12/12/23 1734 92     Resp 12/12/23 1734 15     Temp 12/12/23 1734 98.2 F (36.8 C)     Temp src --      SpO2 12/12/23 1734 100 %     Weight 12/12/23 1735 160 lb 0.9 oz (72.6 kg)     Height --      Head Circumference --      Peak Flow --      Pain Score 12/12/23 1813 0     Pain Loc --      Pain Education --      Exclude from Growth Chart --    Most recent vital signs: Vitals:   12/12/23 2230 12/13/23 0226  BP: 139/88 134/84  Pulse: 82 73  Resp: 18 18  Temp: 98.5 F (36.9 C) 98.5 F (36.9 C)  SpO2: 100% 97%   General: Awake, oriented x4. CV:  Good peripheral perfusion. Resp:  Normal effort. Abd:  No  distention. Other:  Elderly well-developed, well-nourished Caucasian male resting comfortably in no acute distress.  3 cm horizontal linear laceration to the left forehead.  Subcentimeter hemostatic skin tear to the dorsal thenar eminence ED Results / Procedures / Treatments  Labs (all labs ordered are listed, but only abnormal results are displayed) Labs Reviewed  CBC WITH DIFFERENTIAL/PLATELET - Abnormal; Notable for the following components:      Result Value   Hemoglobin 11.7 (*)    HCT 37.5 (*)    RDW 16.6 (*)    All other components within normal limits  COMPREHENSIVE METABOLIC PANEL WITH GFR - Abnormal; Notable for the following components:   Albumin 3.3 (*)    All other components within normal limits   RADIOLOGY ED MD interpretation: CT of the head without contrast interpreted by me shows no evidence of acute abnormalities including no intracerebral hemorrhage, obvious masses, or significant edema CT of the cervical spine interpreted by me does not show any evidence of acute abnormalities including no acute fracture, malalignment, height loss, or dislocation - All radiology independently interpreted and  agree with radiology assessment Official radiology report(s): CT Cervical Spine Wo Contrast Result Date: 12/12/2023 CLINICAL DATA:  Un witnessed fall EXAM: CT CERVICAL SPINE WITHOUT CONTRAST TECHNIQUE: Multidetector CT imaging of the cervical spine was performed without intravenous contrast. Multiplanar CT image reconstructions were also generated. RADIATION DOSE REDUCTION: This exam was performed according to the departmental dose-optimization program which includes automated exposure control, adjustment of the mA and/or kV according to patient size and/or use of iterative reconstruction technique. COMPARISON:  02/12/2023 FINDINGS: Alignment: Stable reversal of cervical lordosis likely due to multilevel cervical degenerative changes. Otherwise alignment is anatomic. Skull base and  vertebrae: No acute fracture. No primary bone lesion or focal pathologic process. Soft tissues and spinal canal: Stable circumscribed 4.1 x 3.5 cm right neck mass, reference image 51/4. Prevertebral soft tissues are unremarkable. No visible canal hematoma. Disc levels: Severe multilevel spondylosis and facet hypertrophy, most pronounced at the C4-5, C5-6, and C6-7 levels. Upper chest: Airway is patent. Visualized portions of the lung apices are clear. Other: Reconstructed images demonstrate no additional findings. IMPRESSION: 1. No acute cervical spine fracture. 2. Stable severe multilevel cervical degenerative changes. 3. Stable circumscribed right neck mass, compatible with biopsy-proven schwannoma. Electronically Signed   By: Ozell Daring M.D.   On: 12/12/2023 18:27   CT Head Wo Contrast Result Date: 12/12/2023 CLINICAL DATA:  Un witnessed fall EXAM: CT HEAD WITHOUT CONTRAST TECHNIQUE: Contiguous axial images were obtained from the base of the skull through the vertex without intravenous contrast. RADIATION DOSE REDUCTION: This exam was performed according to the departmental dose-optimization program which includes automated exposure control, adjustment of the mA and/or kV according to patient size and/or use of iterative reconstruction technique. COMPARISON:  04/11/2023 FINDINGS: Brain: Stable age-appropriate cerebral cortical atrophy. Stable chronic small-vessel ischemic changes within the basal ganglia and throughout the periventricular and subcortical white matter. No signs of acute infarct or hemorrhage. Stable ex vacuo dilatation of the lateral ventricles. Remaining midline structures are unremarkable. No acute extra-axial fluid collections. There is a stable 4 mm hyperdense nodule in the left para falcine region, reference image 38/4, likely a small meningioma. No mass effect. Vascular: No hyperdense vessel or unexpected calcification. Stable atherosclerosis. Skull: Left frontal scalp laceration. No  underlying fracture. Remainder of the calvarium is unremarkable. Sinuses/Orbits: No acute finding. Other: None. IMPRESSION: 1. Stable head CT, no acute intracranial process. Electronically Signed   By: Ozell Daring M.D.   On: 12/12/2023 18:23   PROCEDURES: Critical Care performed: No .Laceration Repair  Date/Time: 12/13/2023 5:12 PM  Performed by: Jossie Artist POUR, MD Authorized by: Jossie Artist POUR, MD   Consent:    Consent obtained:  Verbal   Consent given by:  Patient   Risks, benefits, and alternatives were discussed: yes     Risks discussed:  Infection, pain, retained foreign body, need for additional repair, poor cosmetic result, tendon damage, vascular damage, poor wound healing and nerve damage   Alternatives discussed:  No treatment, delayed treatment, observation and referral Universal protocol:    Immediately prior to procedure, a time out was called: yes     Patient identity confirmed:  Verbally with patient Anesthesia:    Anesthesia method:  Local infiltration Laceration details:    Location:  Face   Face location:  Forehead   Length (cm):  3   Depth (mm):  4 Pre-procedure details:    Preparation:  Patient was prepped and draped in usual sterile fashion Exploration:    Wound exploration: entire depth of  wound visualized     Contaminated: no   Treatment:    Area cleansed with:  Povidone-iodine and saline   Amount of cleaning:  Standard   Irrigation solution:  Sterile saline   Irrigation method:  Syringe Skin repair:    Repair method:  Steri-Strips   Number of Steri-Strips:  3 Approximation:    Approximation:  Close Repair type:    Repair type:  Simple Post-procedure details:    Dressing:  Antibiotic ointment and non-adherent dressing   Procedure completion:  Tolerated well, no immediate complications  MEDICATIONS ORDERED IN ED: Medications  Tdap (BOOSTRIX) injection 0.5 mL (0.5 mLs Intramuscular Given 12/12/23 1945)   IMPRESSION / MDM / ASSESSMENT AND PLAN /  ED COURSE  I reviewed the triage vital signs and the nursing notes.                             The patient is on the cardiac monitor to evaluate for evidence of arrhythmia and/or significant heart rate changes. Patient's presentation is most consistent with acute presentation with potential threat to life or bodily function. Presenting after a fall that occurred just prior to arrival, resulting in injury to the forehead. The mechanism of injury was a mechanical ground level fall without syncope or near-syncope. The current level of pain is moderate. There was no loss of consciousness, confusion, seizure, or memory impairment. There is a laceration associated with the injury. Denies neck pain. The patient does not take blood thinner medications. Denies vomiting, numbness/weakness, fever  Dispo: Discharge with PCP follow-up      FINAL CLINICAL IMPRESSION(S) / ED DIAGNOSES   Final diagnoses:  Fall, initial encounter  Injury of head, initial encounter  Laceration of forehead, initial encounter   Rx / DC Orders   ED Discharge Orders     None      Note:  This document was prepared using Dragon voice recognition software and may include unintentional dictation errors.   Quentavious Rittenhouse K, MD 12/13/23 [phone]

## 2023-12-12 NOTE — ED Notes (Signed)
 Ct bringing pt to ED 12, this RN called and informed nurse that pt was on the way.

## 2023-12-13 NOTE — ED Notes (Signed)
 Facility called and made aware pt being transported back via [facility].

## 2023-12-20 ENCOUNTER — Other Ambulatory Visit (INDEPENDENT_AMBULATORY_CARE_PROVIDER_SITE_OTHER): Payer: Self-pay | Admitting: Vascular Surgery

## 2023-12-20 DIAGNOSIS — I6523 Occlusion and stenosis of bilateral carotid arteries: Secondary | ICD-10-CM

## 2023-12-21 ENCOUNTER — Ambulatory Visit (INDEPENDENT_AMBULATORY_CARE_PROVIDER_SITE_OTHER)

## 2023-12-21 ENCOUNTER — Ambulatory Visit (INDEPENDENT_AMBULATORY_CARE_PROVIDER_SITE_OTHER): Admitting: Nurse Practitioner

## 2023-12-21 ENCOUNTER — Encounter (INDEPENDENT_AMBULATORY_CARE_PROVIDER_SITE_OTHER): Payer: Self-pay | Admitting: Nurse Practitioner

## 2023-12-21 VITALS — BP 120/67 | HR 63 | Ht 68.0 in | Wt 160.0 lb

## 2023-12-21 DIAGNOSIS — I6523 Occlusion and stenosis of bilateral carotid arteries: Secondary | ICD-10-CM | POA: Diagnosis not present

## 2023-12-21 DIAGNOSIS — I10 Essential (primary) hypertension: Secondary | ICD-10-CM

## 2023-12-21 DIAGNOSIS — E785 Hyperlipidemia, unspecified: Secondary | ICD-10-CM | POA: Diagnosis not present

## 2023-12-26 ENCOUNTER — Encounter (INDEPENDENT_AMBULATORY_CARE_PROVIDER_SITE_OTHER): Payer: Self-pay | Admitting: Nurse Practitioner

## 2023-12-26 NOTE — Progress Notes (Signed)
 Subjective:    Patient ID: Thomas [name], male    DOB: 03/04/1931, 89 y.o.   MRN: [id number] No chief complaint on file.   The patient is seen for follow up evaluation of carotid stenosis status post right carotid endarterectomy on 03/19/2023.     Procedure: 1.  Right carotid endarterectomy with bovine pericardial patch reconstruction. 2.  Resection of a right internal carotid artery kink with removal of 20 mm of internal carotid artery and direct reanastomosis to the bulb   The patient denies neck or incisional pain.   The patient denies interval amaurosis fugax. There is no recent history of TIA symptoms or focal motor deficits. There is a prior documented CVA.   The patient denies headache.   The patient is taking enteric-coated aspirin  81 mg daily.   No recent shortening of the patient's walking distance or new symptoms consistent with claudication.  No history of rest pain symptoms. No new ulcers or wounds of the lower extremities have occurred.   There is no history of DVT, PE or superficial thrombophlebitis. No recent episodes of angina or shortness of breath documented.    CT angiogram prior to endarterectomy dated February 12, 2023 demonstrates a 4.1 x 3.5 x 3.5 cm heterogeneous right level 3 and level 4 neck mass. The lesion is well-circumscribed. Differential diagnosis includes a schwannoma. Other nerve sheath tumors are considered as well. This is somewhat inferior for paraganglioma. Malignancy is considered less likely.  This appears unchanged on the duplex ultrasound of the carotid arteries obtained today.  Further workup of this lesion is deferred to primary care.   Duplex ultrasound of carotid arteries done today RICA <30% s/p successful CEA and LICA 1-39%.     Review of Systems  All other systems reviewed and are negative.      Objective:   Physical Exam Vitals reviewed.  HENT:     Head: Normocephalic.  Cardiovascular:     Rate and Rhythm: Normal rate.      Pulses: Normal pulses.  Pulmonary:     Effort: Pulmonary effort is normal.  Skin:    General: Skin is warm and dry.  Neurological:     Mental Status: He is alert. Mental status is at baseline.     Motor: Weakness present.  Psychiatric:        Mood and Affect: Mood normal.        Behavior: Behavior normal.        Thought Content: Thought content normal.        Judgment: Judgment normal.     BP 120/67   Pulse 63   Ht 5' 8 (1.727 m)   Wt 160 lb (72.6 kg)   BMI 24.33 kg/m   Past Medical History:  Diagnosis Date   Arthritis    Carpal tunnel syndrome on both sides    GERD (gastroesophageal reflux disease)    Hyperlipidemia    Hypertension     Social History   Socioeconomic History   Marital status: Widowed    Spouse name: Not on file   Number of children: Not on file   Years of education: Not on file   Highest education level: Not on file  Occupational History   Not on file  Tobacco Use   Smoking status: Never    Passive exposure: Never   Smokeless tobacco: Never  Vaping Use   Vaping status: Never Used  Substance and Sexual Activity   Alcohol  use: Never   Drug use:  Never   Sexual activity: Not on file  Other Topics Concern   Not on file  Social History Narrative   Not on file   Social Drivers of Health   Financial Resource Strain: Low Risk  (10/12/2022)   Received from [facility]   Overall Financial Resource Strain (CARDIA)    Difficulty of Paying Living Expenses: Not hard at all  Food Insecurity: Patient Unable To Answer (08/05/2023)   Hunger Vital Sign    Worried About Running Out of Food in the Last Year: Patient unable to answer    Ran Out of Food in the Last Year: Patient unable to answer  Transportation Needs: Patient Unable To Answer (08/05/2023)   PRAPARE - Transportation    Lack of Transportation (Medical): Patient unable to answer    Lack of Transportation (Non-Medical): Patient unable to answer  Physical Activity: Not on  file  Stress: Not on file  Social Connections: Patient Unable To Answer (08/05/2023)   Social Connection and Isolation Panel    Frequency of Communication with Friends and Family: Patient unable to answer    Frequency of Social Gatherings with Friends and Family: Patient unable to answer    Attends Religious Services: Patient unable to answer    Active Member of Clubs or Organizations: Patient unable to answer    Attends Banker Meetings: Patient unable to answer    Marital Status: Patient unable to answer  Intimate Partner Violence: Patient Unable To Answer (08/05/2023)   Humiliation, Afraid, Rape, and Kick questionnaire    Fear of Current or Ex-Partner: Patient unable to answer    Emotionally Abused: Patient unable to answer    Physically Abused: Patient unable to answer    Sexually Abused: Patient unable to answer    Past Surgical History:  Procedure Laterality Date   ENDARTERECTOMY Right 03/19/2023   Procedure: ENDARTERECTOMY CAROTID;  Surgeon: Jama Cordella MATSU, MD;  Location: [facility] ORS;  Service: Vascular;  Laterality: Right;   INGUINAL HERNIA REPAIR  2001   IR EXCHANGE BILIARY DRAIN  05/16/2023   IR EXCHANGE BILIARY DRAIN  05/28/2023   IR PERC CHOLECYSTOSTOMY  04/17/2023   REPLACEMENT TOTAL KNEE Bilateral     Family History  Problem Relation Age of Onset   Cancer Sister        Liver cancer   Melanoma Sister    Cancer Brother        Pancreatic   Cancer Brother        Lung cancer   Heart disease Brother     No Known Allergies     Latest Ref Rng & Units 12/12/2023    5:38 PM 08/08/2023    4:31 AM 08/07/2023    3:38 AM  CBC  WBC 4.0 - 10.5 K/uL 7.0  6.0  5.5   Hemoglobin 13.0 - 17.0 g/dL 88.2  9.1  9.1   Hematocrit 39.0 - 52.0 % 37.5  28.6  28.8   Platelets 150 - 400 K/uL 271  327  321       CMP     Component Value Date/Time   NA 142 12/12/2023 1738   NA 140 10/28/2011 0622   K 3.9 12/12/2023 1738   K 4.1 10/28/2011 0622   CL 107 12/12/2023  1738   CL 108 (H) 10/28/2011 0622   CO2 24 12/12/2023 1738   CO2 24 10/28/2011 0622   GLUCOSE 95 12/12/2023 1738   GLUCOSE 81 10/28/2011 0622   BUN 15 12/12/2023  1738   BUN 17 10/28/2011 0622   CREATININE 0.81 12/12/2023 1738   CREATININE 1.12 10/28/2011 0622   CALCIUM  9.3 12/12/2023 1738   CALCIUM  7.9 (L) 10/28/2011 0622   PROT 7.4 12/12/2023 1738   ALBUMIN 3.3 (L) 12/12/2023 1738   AST 22 12/12/2023 1738   ALT 13 12/12/2023 1738   ALKPHOS 105 12/12/2023 1738   BILITOT 0.6 12/12/2023 1738   GFRNONAA >60 12/12/2023 1738   GFRNONAA >60 10/28/2011 0622     No results found.     Assessment & Plan:   1. Bilateral carotid artery stenosis (Primary) Recommend:  Given the patient's asymptomatic subcritical stenosis no further invasive testing or surgery at this time.  Duplex ultrasound shows <40% stenosis bilaterally.  Continue antiplatelet therapy as prescribed Continue management of CAD, HTN and Hyperlipidemia Healthy heart diet,  encouraged exercise at least 4 times per week  Follow up in 12 months with duplex ultrasound and physical exam   2. Primary hypertension Continue antihypertensive medications as already ordered, these medications have been reviewed and there are no changes at this time.  3. Dyslipidemia Continue statin as ordered and reviewed, no changes at this time   Current Outpatient Medications on File Prior to Visit  Medication Sig Dispense Refill   acetaminophen  (TYLENOL ) 325 MG tablet Take 2 tablets (650 mg total) by mouth every 6 (six) hours as needed for fever, headache or moderate pain. (Patient taking differently: Take 325 mg by mouth every 6 (six) hours as needed for fever, headache or moderate pain (pain score 4-6).)     aspirin  81 MG chewable tablet Chew 1 tablet (81 mg total) by mouth daily.     atorvastatin  (LIPITOR) 40 MG tablet Take 1 tablet (40 mg total) by mouth daily.     bisacodyl  (DULCOLAX) 10 MG suppository Place 1 suppository (10 mg  total) rectally daily as needed for severe constipation.     bisacodyl  (DULCOLAX) 5 MG EC tablet Take 2 tablets (10 mg total) by mouth at bedtime.     LORazepam (ATIVAN) 0.5 MG tablet Take 0.5 mg by mouth 2 (two) times daily.     pantoprazole  (PROTONIX ) 20 MG tablet Take 20 mg by mouth daily.     polyethylene glycol (MIRALAX  / GLYCOLAX ) 17 g packet Take 17 g by mouth 2 (two) times daily.     polyvinyl alcohol  (LIQUIFILM TEARS) 1.4 % ophthalmic solution Place 2 drops into both eyes as needed for dry eyes.     sertraline  (ZOLOFT ) 25 MG tablet Take 1 tablet by mouth daily.     tamsulosin  (FLOMAX ) 0.4 MG CAPS capsule Take 1 capsule (0.4 mg total) by mouth daily. 90 capsule 3   traZODone  (DESYREL ) 50 MG tablet Take 50 mg by mouth at bedtime.     No current facility-administered medications on file prior to visit.    There are no Patient Instructions on file for this visit. No follow-ups on file.   Tilak Oakley E Kaylynn Chamblin, NP

## 2024-12-20 ENCOUNTER — Encounter (INDEPENDENT_AMBULATORY_CARE_PROVIDER_SITE_OTHER)

## 2024-12-20 ENCOUNTER — Ambulatory Visit (INDEPENDENT_AMBULATORY_CARE_PROVIDER_SITE_OTHER): Admitting: Nurse Practitioner
